# Patient Record
Sex: Male | Born: 1950
Health system: Southern US, Community
[De-identification: ages and names within clinical notes are randomized; demographics above are authoritative.]

## PROBLEM LIST (undated history)

## (undated) DIAGNOSIS — I4821 Permanent atrial fibrillation: Secondary | ICD-10-CM

## (undated) DIAGNOSIS — N529 Male erectile dysfunction, unspecified: Secondary | ICD-10-CM

## (undated) DIAGNOSIS — E119 Type 2 diabetes mellitus without complications: Secondary | ICD-10-CM

## (undated) DIAGNOSIS — E785 Hyperlipidemia, unspecified: Secondary | ICD-10-CM

## (undated) DIAGNOSIS — E291 Testicular hypofunction: Secondary | ICD-10-CM

## (undated) DIAGNOSIS — M545 Low back pain, unspecified: Secondary | ICD-10-CM

## (undated) DIAGNOSIS — I82509 Chronic embolism and thrombosis of unspecified deep veins of unspecified lower extremity: Secondary | ICD-10-CM

## (undated) DIAGNOSIS — G8929 Other chronic pain: Secondary | ICD-10-CM

## (undated) DIAGNOSIS — I5022 Chronic systolic (congestive) heart failure: Secondary | ICD-10-CM

## (undated) DIAGNOSIS — S83209A Unspecified tear of unspecified meniscus, current injury, unspecified knee, initial encounter: Secondary | ICD-10-CM

## (undated) DIAGNOSIS — I10 Essential (primary) hypertension: Secondary | ICD-10-CM

## (undated) HISTORY — DX: Low back pain, unspecified: M54.50

## (undated) HISTORY — DX: Hyperlipidemia, unspecified: E78.5

## (undated) HISTORY — DX: Other chronic pain: G89.29

## (undated) HISTORY — DX: Unspecified tear of unspecified meniscus, current injury, unspecified knee, initial encounter: S83.209A

## (undated) HISTORY — DX: Male erectile dysfunction, unspecified: N52.9

## (undated) HISTORY — DX: Testicular hypofunction: E29.1

## (undated) HISTORY — DX: Low back pain: M54.5

## (undated) HISTORY — PX: OTHER SURGICAL HISTORY: SHX169

---

## 2009-01-31 ENCOUNTER — Ambulatory Visit: Payer: Self-pay | Admitting: General Practice

## 2009-09-22 ENCOUNTER — Ambulatory Visit: Payer: Self-pay | Admitting: General Practice

## 2010-02-17 ENCOUNTER — Encounter: Payer: Self-pay | Admitting: Cardiovascular Disease

## 2010-02-17 ENCOUNTER — Ambulatory Visit: Payer: Self-pay | Admitting: Internal Medicine

## 2010-02-17 ENCOUNTER — Inpatient Hospital Stay: Payer: Self-pay | Admitting: Internal Medicine

## 2010-02-17 LAB — CONVERTED CEMR LAB
Cholesterol: 139 mg/dL
HDL: 38 mg/dL
LDL Cholesterol: 79 mg/dL
Triglyceride fasting, serum: 111 mg/dL

## 2010-02-18 ENCOUNTER — Encounter: Payer: Self-pay | Admitting: Internal Medicine

## 2010-02-20 ENCOUNTER — Ambulatory Visit: Payer: Self-pay | Admitting: Cardiology

## 2010-02-20 ENCOUNTER — Encounter: Payer: Self-pay | Admitting: Internal Medicine

## 2010-02-20 ENCOUNTER — Encounter: Payer: Self-pay | Admitting: Cardiovascular Disease

## 2010-02-20 DIAGNOSIS — I482 Chronic atrial fibrillation, unspecified: Secondary | ICD-10-CM | POA: Insufficient documentation

## 2010-02-20 DIAGNOSIS — I4891 Unspecified atrial fibrillation: Secondary | ICD-10-CM

## 2010-02-23 ENCOUNTER — Encounter: Payer: Self-pay | Admitting: Internal Medicine

## 2010-02-23 ENCOUNTER — Ambulatory Visit: Payer: Self-pay | Admitting: Internal Medicine

## 2010-02-23 DIAGNOSIS — I10 Essential (primary) hypertension: Secondary | ICD-10-CM | POA: Insufficient documentation

## 2010-02-23 DIAGNOSIS — I129 Hypertensive chronic kidney disease with stage 1 through stage 4 chronic kidney disease, or unspecified chronic kidney disease: Secondary | ICD-10-CM | POA: Insufficient documentation

## 2010-02-23 DIAGNOSIS — I420 Dilated cardiomyopathy: Secondary | ICD-10-CM | POA: Insufficient documentation

## 2010-02-23 LAB — CONVERTED CEMR LAB
BUN: 23 mg/dL (ref 6–23)
Creatinine, Ser: 0.87 mg/dL (ref 0.40–1.50)
Glucose, Bld: 182 mg/dL — ABNORMAL HIGH (ref 70–99)
Prothrombin Time: 14.8 s
Prothrombin Time: 14.8 s (ref 11.6–15.2)

## 2010-03-02 ENCOUNTER — Ambulatory Visit: Payer: Self-pay | Admitting: Internal Medicine

## 2010-03-05 LAB — CONVERTED CEMR LAB
ALT: 17 units/L (ref 0–53)
CO2: 22 meq/L (ref 19–32)
Calcium: 9.2 mg/dL (ref 8.4–10.5)
Chloride: 98 meq/L (ref 96–112)
Creatinine, Ser: 1.03 mg/dL (ref 0.40–1.50)
Glucose, Bld: 271 mg/dL — ABNORMAL HIGH (ref 70–99)
Total Bilirubin: 0.7 mg/dL (ref 0.3–1.2)
Total Protein: 6.7 g/dL (ref 6.0–8.3)

## 2010-03-09 ENCOUNTER — Telehealth: Payer: Self-pay | Admitting: Internal Medicine

## 2010-03-30 ENCOUNTER — Ambulatory Visit: Payer: Self-pay | Admitting: General Practice

## 2010-04-15 ENCOUNTER — Ambulatory Visit: Payer: Self-pay | Admitting: Cardiovascular Disease

## 2010-04-15 ENCOUNTER — Telehealth: Payer: Self-pay | Admitting: Cardiovascular Disease

## 2010-04-15 ENCOUNTER — Emergency Department: Payer: Self-pay | Admitting: Emergency Medicine

## 2010-05-04 ENCOUNTER — Encounter: Payer: Self-pay | Admitting: Cardiovascular Disease

## 2010-05-04 ENCOUNTER — Ambulatory Visit: Payer: Self-pay | Admitting: Cardiovascular Disease

## 2010-05-05 LAB — CONVERTED CEMR LAB
CO2: 23 meq/L (ref 19–32)
Chloride: 101 meq/L (ref 96–112)
Eosinophils Absolute: 0.1 10*3/uL (ref 0.0–0.7)
HCT: 49.3 % (ref 39.0–52.0)
Lymphocytes Relative: 20 % (ref 12–46)
Lymphs Abs: 1.7 10*3/uL (ref 0.7–4.0)
MCV: 89.2 fL (ref 78.0–100.0)
Monocytes Relative: 6 % (ref 3–12)
Neutrophils Relative %: 72 % (ref 43–77)
Platelets: 220 10*3/uL (ref 150–400)
Potassium: 4.6 meq/L (ref 3.5–5.3)
RBC: 5.53 M/uL (ref 4.22–5.81)
WBC: 8.3 10*3/uL (ref 4.0–10.5)

## 2010-05-15 ENCOUNTER — Ambulatory Visit: Payer: Self-pay | Admitting: Cardiovascular Disease

## 2010-05-18 ENCOUNTER — Ambulatory Visit: Payer: Self-pay | Admitting: Cardiovascular Disease

## 2010-05-18 ENCOUNTER — Encounter: Payer: Self-pay | Admitting: Cardiovascular Disease

## 2010-05-22 ENCOUNTER — Ambulatory Visit: Payer: Self-pay | Admitting: Cardiovascular Disease

## 2010-06-01 ENCOUNTER — Encounter: Payer: Self-pay | Admitting: Cardiovascular Disease

## 2010-06-01 ENCOUNTER — Telehealth: Payer: Self-pay | Admitting: Cardiovascular Disease

## 2010-06-03 ENCOUNTER — Ambulatory Visit: Payer: Self-pay | Admitting: Cardiovascular Disease

## 2010-06-03 DIAGNOSIS — R609 Edema, unspecified: Secondary | ICD-10-CM | POA: Insufficient documentation

## 2010-06-05 LAB — CONVERTED CEMR LAB
BUN: 20 mg/dL (ref 6–23)
CO2: 25 meq/L (ref 19–32)
Chloride: 103 meq/L (ref 96–112)
Creatinine, Ser: 1.02 mg/dL (ref 0.40–1.50)
Glucose, Bld: 151 mg/dL — ABNORMAL HIGH (ref 70–99)
Potassium: 4.4 meq/L (ref 3.5–5.3)

## 2010-12-08 NOTE — Assessment & Plan Note (Signed)
Summary: EKG= NURSE VISIT  Nurse Visit   Vital Signs:  Patient profile:   60 year old male Weight:      255.75 pounds Pulse rate:   72 / minute BP sitting:   149 / 96  Vitals Entered By: Sherri Rad, RN, BSN (May 22, 2010 8:45 AM)  Visit Type:  Nurse Visit- EKG    Allergies: No Known Drug Allergies  Appended Document: EKG= NURSE VISIT Still in atrial fib, rate has significant improved.  If he would like to repeat cardioversion, would continue amio 200 two times a day.  If he does not want to do cardioversion at this time, would decrease amio to 200 mg daily  Appended Document: EKG= NURSE VISIT I attempted to call the pt. Dr. Mariah Milling had also given me orders to have the pt increase his amlodipine to 10mg  once daily. I have instructed the pt on his voice mail to do this, but have asked that he call back monday with how he feels about having a repeat DCCV done- we will adjust his amiodarone based on his decision. The pt did tell me that his appt's will need to be on M/W/F in the mornings. He will be on vacation next week.  Appended Document: EKG= NURSE VISIT Spoke with Dr. Mariah Milling about patient with swelling in legs. Told patient to cut Amlodipine 10 mg in half and to take Furosemide 20 mg one tablet in the a.m. with one tablet at 3:00 pm. x 3 days and then decrease to one tablet daily. He was instructed to take potassium 20 meq one tablet two times a day along with his Furosemide tablet.  Rx for Potassium Chloride 20 meq one tablet twice a day called to [facility]. He was also instructed to watch his fluid/salt intake. He will call to let us know how he is doing while at the beach.

## 2010-12-08 NOTE — Miscellaneous (Signed)
Summary: simvastatin,pradaxa,furosemide,metoprolol,amiodarone  Clinical Lists Changes  Medications: Rx of SIMVASTATIN 40 MG TABS (SIMVASTATIN) Take 1 tablet by mouth once daily;  #30 x 6;  Signed;  Entered by: Benedict Needy, RN;  Authorized by: Dossie Arbour MD;  Method used: Print then Give to Patient Rx of PRADAXA 150 MG CAPS (DABIGATRAN ETEXILATE MESYLATE) Take 1 tablet by mouth two times a day;  #60 x 6;  Signed;  Entered by: Benedict Needy, RN;  Authorized by: Dossie Arbour MD;  Method used: Print then Give to Patient Rx of FUROSEMIDE 20 MG TABS (FUROSEMIDE) Take 1 tablet by mouth two times a day as needed;  #60 x 6;  Signed;  Entered by: Benedict Needy, RN;  Authorized by: Dossie Arbour MD;  Method used: Print then Give to Patient Rx of METOPROLOL TARTRATE 50 MG TABS (METOPROLOL TARTRATE) 1 tablet two times everyday;  #60 x 6;  Signed;  Entered by: Benedict Needy, RN;  Authorized by: Dossie Arbour MD;  Method used: Print then Give to Patient Rx of AMIODARONE HCL 200 MG TABS (AMIODARONE HCL) Take 2 tabs twice a day for 4 days then decrease to 1 tab twice a day.;  #60 x 6;  Signed;  Entered by: Benedict Needy, RN;  Authorized by: Dossie Arbour MD;  Method used: Print then Give to Patient    Prescriptions: AMIODARONE HCL 200 MG TABS (AMIODARONE HCL) Take 2 tabs twice a day for 4 days then decrease to 1 tab twice a day.  #60 x 6   Entered by:   Benedict Needy, RN   Authorized by:   Dossie Arbour MD   Signed by:   Benedict Needy, RN on 05/15/2010   Method used:   Print then Give to Patient   RxID:   [id number] METOPROLOL TARTRATE 50 MG TABS (METOPROLOL TARTRATE) 1 tablet two times everyday  #60 x 6   Entered by:   Benedict Needy, RN   Authorized by:   Dossie Arbour MD   Signed by:   Benedict Needy, RN on 05/15/2010   Method used:   Print then Give to Patient   RxID:   [id number] FUROSEMIDE 20 MG TABS (FUROSEMIDE) Take 1 tablet by mouth two times a day as needed  #60 x 6   Entered by:    Benedict Needy, RN   Authorized by:   Dossie Arbour MD   Signed by:   Benedict Needy, RN on 05/15/2010   Method used:   Print then Give to Patient   RxID:   [id number] PRADAXA 150 MG CAPS (DABIGATRAN ETEXILATE MESYLATE) Take 1 tablet by mouth two times a day  #60 x 6   Entered by:   Benedict Needy, RN   Authorized by:   Dossie Arbour MD   Signed by:   Benedict Needy, RN on 05/15/2010   Method used:   Print then Give to Patient   RxID:   [id number] SIMVASTATIN 40 MG TABS (SIMVASTATIN) Take 1 tablet by mouth once daily  #30 x 6   Entered by:   Benedict Needy, RN   Authorized by:   Dossie Arbour MD   Signed by:   Benedict Needy, RN on 05/15/2010   Method used:   Print then Give to Patient   RxID:   [id number]

## 2010-12-08 NOTE — Progress Notes (Signed)
Summary: LE Swelling  Phone Note Call from Patient   Caller: Patient Call For: [facility] Summary of Call: Pt concerned about LE swelling. Was instructed to cut amolodipine in half has not been doing so. Will start cutting medication in half and see Dr. Mariah Milling on Wednesday.  Initial call taken by: Benedict Needy, RN,  June 01, 2010 8:59 AM

## 2010-12-08 NOTE — Progress Notes (Signed)
Summary: schedule f/u with Dr Mariah Milling  Phone Note Outgoing Call   Call placed by: Cloyde Reams RN,  April 15, 2010 3:23 PM Summary of Call: Pt seen at hospital, per Dr Mariah Milling pt needs a f/u appt in 1-2 weeks with Dr Mariah Milling.  Call pt tomorrow to schedule appt. Pt needs 10 day CBC for Pradaxa at [facility] with Dr Mariah Milling. Discharged on Metoprolol 50mg  two times a day, Pradaxa 150mg  two times a day,Metformin 1000mg  two times a day,Lasix 20mg  two times a day as needed. Initial call taken by: Cloyde Reams RN,  April 15, 2010 3:26 PM  Follow-up for Phone Call        Pt scheduled for f/u visit with Dr Mariah Milling on 05/04/10. Follow-up by: Cloyde Reams RN,  April 17, 2010 1:51 PM    New/Updated Medications: LOPRESSOR 50 MG TABS (METOPROLOL TARTRATE) Take 1  tablet by mouth twice daily PRADAXA 150 MG CAPS (DABIGATRAN ETEXILATE MESYLATE) Take 1 tablet by mouth two times a day FUROSEMIDE 20 MG TABS (FUROSEMIDE) Take 1 tablet by mouth two times a day as needed

## 2010-12-08 NOTE — Op Note (Signed)
Summary: Operative Report  Operative Report   Imported By: [place] 06/01/2010 09:31:50  _____________________________________________________________________  External Attachment:    Type:   Image     Comment:   External Document

## 2010-12-08 NOTE — Assessment & Plan Note (Signed)
Summary: EPH/AMD   Visit Type:  Follow-up Primary Provider:  Loraine Leriche Crissman,M.D.  CC:  No cardiac complaints.  History of Present Illness: Jeff [name] is a 60 y/o male with h/o HTN, DM2 and hyperlipidemia with recent admission to [facility] in April for atrial fibrillation with RVR, repeat visit to the emergency room on June 8 where I consult on him for rapid atrial fibrillation.  In the emergency room, he was started on pradaxa, and metoprolol for rate control. He was also given furosemide. He states that he has been feeling relatively well though would like to do the procedure to get out of atrial fibrillation. no significant chest pain or shortness of breath or lower extremity edema. No cough.   Echo in 02/2010 shows EF 40-45% which was felt to be tachy-induced.  EKG shows atrial fibrillation with ventricular rate 104 weeks per minute, no significant ST or T wave changes.   Current Medications (verified): 1)  Simvastatin 40 Mg Tabs (Simvastatin) .... Take 1 Tablet By Mouth Once Daily 2)  Metformin Hcl 1000 Mg Tabs (Metformin Hcl) .... Take 1 Tablet By Mouth Twice Daily 3)  Pradaxa 150 Mg Caps (Dabigatran Etexilate Mesylate) .... Take 1 Tablet By Mouth Two Times A Day 4)  Furosemide 20 Mg Tabs (Furosemide) .... Take 1 Tablet By Mouth Two Times A Day As Needed 5)  Metoprolol Tartrate 50 Mg Tabs (Metoprolol Tartrate) .Marland Kitchen.. 1 Tablet Two Times Everyday 6)  Ciprofloxacin Hcl 500 Mg Tabs (Ciprofloxacin Hcl) .Marland Kitchen.. 1 Two Times A Day  Allergies (verified): No Known Drug Allergies  Past History:  Past Medical History: Last updated: 02/23/2010 1. Atrial Fibrillation (dx'd 02/2010) 2. 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  3. Diabetes Type 1 4. Hypertension 5. Hyperlipidemia 6. Chronic low back pain  Family History: Last updated: 05/04/2010 Family History of Coronary Artery Disease:  Family  History of Diabetes:   Social History: Last updated: 02/23/2010 Divorced. Former [employer] information [employer] manager. Previous tobacco.   Family History: Family History of Coronary Artery Disease:  Family History of Diabetes:   Review of Systems  The patient denies fever, weight loss, weight gain, vision loss, decreased hearing, hoarseness, chest pain, syncope, dyspnea on exertion, peripheral edema, prolonged cough, abdominal pain, incontinence, muscle weakness, depression, and enlarged lymph nodes.    Vital Signs:  Patient profile:   60 year old male Height:      71 inches Weight:      246 pounds BMI:     34.43 Pulse rate:   108 / minute BP sitting:   150 / 99  (left arm) Cuff size:   large  Vitals Entered By: Bishop Dublin, CMA (May 04, 2010 9:37 AM)  Physical Exam  General:  Well developed, well nourished, in no acute distress. Head:  normocephalic and atraumatic Neck:  Neck supple, no JVD. No masses, thyromegaly or abnormal cervical nodes. Lungs:  Clear bilaterally to auscultation and percussion. Heart:  Non-displaced PMI, chest non-tender; irregular rate and rhythm, S1, S2 without murmurs, rubs or gallops. Carotid upstroke normal, no bruit.  Pedals normal pulses. No edema, no varicosities. Abdomen:  Bowel sounds positive; abdomen soft and non-tender without masses Msk:  Back normal, normal gait. Muscle strength and tone normal. Pulses:  pulses normal in all 4 extremities Extremities:  No clubbing or cyanosis. Neurologic:  Alert and oriented x 3. Skin:  Intact without lesions or rashes. Psych:  Normal affect.   New Orders:  1)  T-CBC w/Diff ([id number])     2)  T-Basic Metabolic Panel ([id number])   Impression & Recommendations:  Problem # 1:  ATRIAL FIBRILLATION (ICD-427.31) he has been in chronic atrial fibrillation for close to 2-1/2 months. He has been on pradaxa b.i.d. We will set him up for a cardioversion in one week at [facility].  In an effort to slow him down and prepare  him for cardioversion, we will start him on amiodarone 400 mg b.i.d. for 4 days decreasing to 200 mg b.i.d. until cardioversion.  The following medications were removed from the medication list:    Lopressor 50 Mg Tabs (Metoprolol tartrate) .Marland Kitchen... Take 1  tablet by mouth twice daily    Digoxin 0.25 Mg Tabs (Digoxin) .Marland Kitchen... Take 1 tablet by mouth once daily His updated medication list for this problem includes:    Metoprolol Tartrate 50 Mg Tabs (Metoprolol tartrate) .Marland Kitchen... 1 tablet two times everyday    Amiodarone Hcl 200 Mg Tabs (Amiodarone hcl) .Marland Kitchen... Take 2 tabs twice a day for 4 days then decrease to 1 tab twice a day.  Orders: T-CBC w/Diff [id number]) T-Basic Metabolic Panel [id number])  Problem # 2:  HYPERTENSION, BENIGN (ICD-401.1) Blood pressure is borderline elevated and we will continue to monitor this We will adjust his medications if needed with increasing his amlodipine to a 10 mg dose if his pressure continues to be greater than 140.  The following medications were removed from the medication list:    Amlodipine Besylate 5 Mg Tabs (Amlodipine besylate) .Marland Kitchen... Take 1 tablet by mouth once daily    Benazepril Hcl 40 Mg Tabs (Benazepril hcl) .Marland Kitchen... Take 1 tablet by mouth once daily as needed    Lopressor 50 Mg Tabs (Metoprolol tartrate) .Marland Kitchen... Take 1  tablet by mouth twice daily    Spironolactone 25 Mg Tabs (Spironolactone) .Marland Kitchen... Take 1/2 tablet by mouth daily His updated medication list for this problem includes:    Furosemide 20 Mg Tabs (Furosemide) .Marland Kitchen... Take 1 tablet by mouth two times a day as needed    Metoprolol Tartrate 50 Mg Tabs (Metoprolol tartrate) .Marland Kitchen... 1 tablet two times everyday  Orders: T-CBC w/Diff ([id number]) T-Basic Metabolic Panel ([id number])  Problem # 3:  CARDIOMYOPATHY, PRIMARY, DILATED (ICD-425.4) Mildly depressed systolic function in the setting of atrial fibrillation. No signs of heart failure on today's visit.  The following medications were  removed from the medication list:    Amlodipine Besylate 5 Mg Tabs (Amlodipine besylate) .Marland Kitchen... Take 1 tablet by mouth once daily    Benazepril Hcl 40 Mg Tabs (Benazepril hcl) .Marland Kitchen... Take 1 tablet by mouth once daily as needed    Lopressor 50 Mg Tabs (Metoprolol tartrate) .Marland Kitchen... Take 1  tablet by mouth twice daily    Digoxin 0.25 Mg Tabs (Digoxin) .Marland Kitchen... Take 1 tablet by mouth once daily    Spironolactone 25 Mg Tabs (Spironolactone) .Marland Kitchen... Take 1/2 tablet by mouth daily His updated medication list for this problem includes:    Furosemide 20 Mg Tabs (Furosemide) .Marland Kitchen... Take 1 tablet by mouth two times a day as needed    Metoprolol Tartrate 50 Mg Tabs (Metoprolol tartrate) .Marland Kitchen... 1 tablet two times everyday    Amiodarone Hcl 200 Mg Tabs (Amiodarone hcl) .Marland Kitchen... Take 2 tabs twice a day for 4 days then decrease to 1 tab twice a day.  Orders: T-CBC w/Diff [id number]) T-Basic Metabolic Panel [id number])  Patient Instructions: 1)  Your physician recommends that you have for lab work today  2)  Your physician has recommended you make the following change in your medication: START amiodarone 400mg  two times a day for 4 days and then 3)  Your physician has recommended that you have a cardioversion (DCCV).  Electrical cardioversion uses a jolt of electricity to your heart either through paddles or wired patches attached to your chest. This is a controlled, usually prescheduled, procedure. Defibrillation is done under light anesthesia in the hospital, and you usually go home the day of the procedure. This is done to get your heart back into a normal rhythm. You are not awake for the procedure. Please see the instruction sheet given to you today. Prescriptions: AMIODARONE HCL 200 MG TABS (AMIODARONE HCL) Take 2 tabs twice a day for 4 days then decrease to 1 tab twice a day.  #76 x 0   Entered by:   Benedict Needy, RN   Authorized by:   Dossie Arbour MD   Signed by:   Benedict Needy, RN on 05/04/2010   Method  used:   Print then Give to Patient   RxID:   [id number] PRADAXA 150 MG CAPS (DABIGATRAN ETEXILATE MESYLATE) Take 1 tablet by mouth two times a day  #60 x 3   Entered by:   Bishop Dublin, CMA   Authorized by:   Dossie Arbour MD   Signed by:   Bishop Dublin, CMA on 05/04/2010   Method used:   Electronically to        [facility] [phone].* (retail)       [address]       [place], [place]  [address]       Ph: [phone]       Fax: [phone]   RxID:   [id number] FUROSEMIDE 20 MG TABS (FUROSEMIDE) Take 1 tablet by mouth two times a day as needed  #60 x 3   Entered by:   Bishop Dublin, CMA   Authorized by:   Dossie Arbour MD   Signed by:   Bishop Dublin, CMA on 05/04/2010   Method used:   Electronically to        [facility] [phone].* (retail)       [address]       [place], [place]  [address]       Ph: [phone]       Fax: [phone]   RxID:   [id number]

## 2010-12-08 NOTE — Progress Notes (Signed)
Summary: LABWORK  Phone Note Call from Patient Call back at Home Phone [phone]   Caller: SELF Call For: GOLLAN Summary of Call: PT WANTS TO KNOW IF IT IS NECESSARY THAT HE COME FOR BLOODWORD TODAY-STATES THAT HE WAS NEVER CALLED BACK AB0UT HIS PT INR FROM LAST WEEK TO LET HIM KNOW THE RESULTS Initial call taken by: Harlon Flor,  Mar 09, 2010 12:39 PM  Follow-up for Phone Call        Attempted TCB pt.  LMOM to call back.  Pt appears to be pending DCCV after 4 consecutive INR's greater than 2.0.   Follow-up by: Cloyde Reams RN,  Mar 09, 2010 1:53 PM  Additional Follow-up for Phone Call Additional follow up Details #1::        Called spoke with pt.  Pt states he has not decided for sure if he wants to proceed with DCCV.  Advised pt he still needs to come in this week for PT/INR since he is a new start on coumadin.  Pt is currently taking 5mg  daily except 7.5mg  on Tu,Th.  Advised to continue on same dosage INR 2.07 on 03/02/10 and to come in tomorrow for PT/INR.   Additional Follow-up by: Cloyde Reams RN,  Mar 09, 2010 3:29 PM

## 2010-12-08 NOTE — Assessment & Plan Note (Signed)
Summary: POST DCCV/ALT   Visit Type:  Follow-up Primary Provider:  Loraine Leriche Crissman,M.D.  CC:  c/o swelling still in legs..  History of Present Illness: Jeff [name] is a 60 y/o male with h/o HTN, DM2 and hyperlipidemia with recent admission to [facility] in April for atrial fibrillation with RVR, repeat visit to the emergency room on June 8 where I consult on him for rapid atrial fibrillation, s/p cardioversion on 05/15/2010, presenting with lower extremity edema.  He reports that over the past week or 2, his edema has gotten much worse. It is both legs, up to the knees and is pitting. He denies excessive fluid or salt intake though [place] the [place]. He denies any significant shortness of breath. he does not feel any palpitations.   Echo in 02/2010 shows EF 40-45% which was felt to be tachy-induced.  EKG today shows normal sinus rhythm with rate of 54 beats per minute, no significant ST or T wave changes   Current Medications (verified): 1)  Simvastatin 40 Mg Tabs (Simvastatin) .... Take 1 Tablet By Mouth Once Daily 2)  Metformin Hcl 1000 Mg Tabs (Metformin Hcl) .... Take 1 Tablet By Mouth Twice Daily 3)  Pradaxa 150 Mg Caps (Dabigatran Etexilate Mesylate) .... Take 1 Tablet By Mouth Two Times A Day 4)  Furosemide 20 Mg Tabs (Furosemide) .... Take 1 Tablet By Mouth Two Times A Day As Needed 5)  Metoprolol Tartrate 50 Mg Tabs (Metoprolol Tartrate) .Marland Kitchen.. 1 Tablet Two Times Everyday 6)  Ciprofloxacin Hcl 500 Mg Tabs (Ciprofloxacin Hcl) .Marland Kitchen.. 1 Two Times A Day 7)  Amiodarone Hcl 200 Mg Tabs (Amiodarone Hcl) .... Take One Tablet By Mouth Twice A Day 8)  Amlodipine Besylate 10 Mg Tabs (Amlodipine Besylate) .... Take 1 Tablet By Mouth Daily  Allergies (verified): No Known Drug Allergies  Past History:  Past Medical History: Last updated: 02/23/2010 1. Atrial Fibrillation (dx'd 02/2010) 2. 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  3. Diabetes Type 1 4. Hypertension 5. Hyperlipidemia 6. 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   Review of Systems       The patient complains of peripheral edema.  The patient denies fever, weight loss, weight gain, vision loss, decreased hearing, hoarseness, chest pain, syncope, dyspnea on exertion, prolonged cough, abdominal pain, incontinence, muscle weakness, depression, and enlarged lymph nodes.    Vital Signs:  Patient profile:   60 year old male Height:      71 inches Weight:      256 pounds BMI:     35.83 Pulse rate:   55 / minute BP sitting:   148 / 85  (left arm) Cuff size:   large  Vitals Entered By: Bishop Dublin, CMA (June 03, 2010 9:38 AM)  Physical Exam  General:  Well developed, well nourished, in no acute distress. Head:  normocephalic and atraumatic Neck:  Neck supple, no JVD. No masses, thyromegaly or abnormal cervical nodes. Lungs:  Clear bilaterally to auscultation and percussion. Heart:  Non-displaced PMI, chest non-tender; regular rate and rhythm, S1, S2 without murmurs, rubs or gallops. Carotid upstroke normal, no bruit. Pedals normal pulses. 1 to 2+ pitting edema LE b/l, no varicosities. Abdomen:  Bowel sounds positive; abdomen soft and non-tender without masses  Msk:  Back normal, normal gait. Muscle strength and tone normal. Extremities:  No clubbing or cyanosis. Neurologic:  Alert and oriented x 3. Skin:  Intact without lesions or rashes. Psych:  Normal affect.   Impression & Recommendations:  Problem # 1:  ATRIAL FIBRILLATION (ICD-427.31)  he has converted from atrial fibrillation to normal sinus rhythm over the past 2 weeks. I suspect it may have been with better blood pressure control and  mild diuresis. We'll continue him on his current medications.  The following medications were removed from the medication list:    Amiodarone Hcl 400 Mg Tabs (Amiodarone hcl) .Marland Kitchen... Take one tablet by mouth twice a dayfor 1 week His updated medication list for this problem includes:    Metoprolol Tartrate 50 Mg Tabs (Metoprolol tartrate) .Marland Kitchen... 1 tablet two times everyday    Amiodarone Hcl 200 Mg Tabs (Amiodarone hcl) .Marland Kitchen... Take one tablet by mouth twice a day  Orders: EKG w/ Interpretation (93000)  The following medications were removed from the medication list:    Amiodarone Hcl 400 Mg Tabs (Amiodarone hcl) .Marland Kitchen... Take one tablet by mouth twice a dayfor 1 week His updated medication list for this problem includes:    Metoprolol Tartrate 50 Mg Tabs (Metoprolol tartrate) .Marland Kitchen... 1 tablet two times everyday    Amiodarone Hcl 200 Mg Tabs (Amiodarone hcl) .Marland Kitchen... Take one tablet by mouth twice a day  Problem # 2:  HYPERTENSION, BENIGN (ICD-401.1)  He does have a history of hypertension. I feel that his lower extremity edema is likely due to his amlodipine. We will hold amlodipine and start lisinopril 20 mg daily.  We have ordered a basic metabolic panel today to evaluate his kidney function and potassium as he is on Lasix b.i.d.  The following medications were removed from the medication list:    Amlodipine Besylate 10 Mg Tabs (Amlodipine besylate) .Marland Kitchen... Take 1 tablet by mouth daily His updated medication list for this problem includes:    Furosemide 20 Mg Tabs (Furosemide) .Marland Kitchen... Take 1 tablet by mouth two times a day as needed    Metoprolol Tartrate 50 Mg Tabs (Metoprolol tartrate) .Marland Kitchen... 1 tablet two times everyday    Lisinopril 20 Mg Tabs (Lisinopril) .Marland Kitchen... Take one tablet by mouth daily  The following medications were removed from the medication list:    Amlodipine Besylate 10 Mg Tabs (Amlodipine besylate) .Marland Kitchen... Take 1 tablet by mouth daily His updated medication list for this problem  includes:    Furosemide 20 Mg Tabs (Furosemide) .Marland Kitchen... Take 1 tablet by mouth two times a day as needed    Metoprolol Tartrate 50 Mg Tabs (Metoprolol tartrate) .Marland Kitchen... 1 tablet two times everyday    Lisinopril 20 Mg Tabs (Lisinopril) .Marland Kitchen... Take one tablet by mouth daily  Problem # 3:  CARDIOMYOPATHY, PRIMARY, DILATED (ICD-425.4) Mildly depressed ejection fraction was in the setting of atrial fibrillation with RVR. If his lower extremity edema does not improve, we will order a repeat echocardiogram to assess his LV function.  The following medications were removed from the medication list:    Amiodarone Hcl 400 Mg Tabs (Amiodarone hcl) .Marland Kitchen... Take one tablet by mouth twice a dayfor 1 week    Amlodipine Besylate 10 Mg Tabs (Amlodipine besylate) .Marland Kitchen... Take 1 tablet by mouth daily His updated medication list for this problem includes:    Furosemide 20 Mg Tabs (Furosemide) .Marland Kitchen... Take 1 tablet by mouth two times a day as needed    Metoprolol Tartrate 50 Mg Tabs (Metoprolol  tartrate) .Marland Kitchen... 1 tablet two times everyday    Amiodarone Hcl 200 Mg Tabs (Amiodarone hcl) .Marland Kitchen... Take one tablet by mouth twice a day    Lisinopril 20 Mg Tabs (Lisinopril) .Marland Kitchen... Take one tablet by mouth daily  Other Orders: T-Basic Metabolic Panel [id number])  Patient Instructions: 1)  Your physician recommends that you have lab work today BMP 2)  Your physician has recommended you make the following change in your medication: STOP amolodipine and START lisinopril 3)  Your physician wants you to follow-up in:  6 months  You will receive a reminder letter in the mail two months in advance. If you don't receive a letter, please call our office to schedule the follow-up appointment. Prescriptions: LISINOPRIL 20 MG TABS (LISINOPRIL) Take one tablet by mouth daily  #30 x 6   Entered by:   Benedict Needy, RN   Authorized by:   Dossie Arbour MD   Signed by:   Benedict Needy, RN on 06/03/2010   Method used:   Printed then faxed to  ...       [facility] [employer]. (mail-order)       [address]       [place], [place]  [address]       Ph: [phone]       Fax: [phone]   RxID:   [id number]

## 2010-12-08 NOTE — Letter (Signed)
[facility]   Imported By: Harlon Flor 02/20/2010 13:56:34  _____________________________________________________________________  External Attachment:    Type:   Image     Comment:   External Document

## 2010-12-08 NOTE — Medication Information (Signed)
Summary: [facility]  Anticoagulant Therapy  Managed by: Inactive PCP: Loraine Leriche Crissman,M.D. Supervising MD: Gala Romney MD, Reuel Boom Indication 1: Atrial Fibrillation Lab Used: [facility]  Site: [place] INR RANGE 2.0-3.0          Comments: Pt is off coumadin and is now on Pradaxa.  Allergies: No Known Drug Allergies  Anticoagulation Management History:      Negative risk factors for bleeding include an age less than 60 years old.  The bleeding index is 'low risk'.  Positive CHADS2 values include History of HTN.  Negative CHADS2 values include Age > 60 years old.  His last INR was 2.07.  Anticoagulation responsible provider: Bensimhon MD, Reuel Boom.    Anticoagulation Management Assessment/Plan:      The target INR is 2.5-3.5.  The next INR is due 03/02/2010.  Anticoagulation instructions were given to patient.  Results were reviewed/authorized by Inactive.         Prior Anticoagulation Instructions: coumadin 10 mg today then coumadin 5 mg daily with 7.5 mg on Tues and Thurs

## 2010-12-08 NOTE — Assessment & Plan Note (Signed)
Summary: NP6/AMD   CC:  ROV; No complaints.  History of Present Illness: Jeff [name] is a 60 y/o male with h/o HTN, DM2 and hyperlipidemia. Admitted to [facility] last week with acute CHF in setting of AF with RVR. Echo showed EF 40-45% which was felt to be tachy-induced.  Cardiac markers normal. Diuresed. Started on b-blocker, digoxin, spironloactone and coumadin. Returns for routine f/u. Feels great. No CP, SOB, palpitations or edema. INR 1.4 today.   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 BP labile 117/70 to 148/87.   Current Medications (verified): 1)  Simvastatin 40 Mg Tabs (Simvastatin) .... Take 1 Tablet By Mouth Once Daily 2)  Amlodipine Besylate 5 Mg Tabs (Amlodipine Besylate) .... Take 1 Tablet By Mouth Once Daily 3)  Metformin Hcl 1000 Mg Tabs (Metformin Hcl) .... Take 1 Tablet By Mouth Twice Daily 4)  Benazepril Hcl 40 Mg Tabs (Benazepril Hcl) .... Take 1 Tablet By Mouth Once Daily As Needed 5)  Lopressor 50 Mg Tabs (Metoprolol Tartrate) .... Take 1 1/2  Tablet By Mouth Twice Daily 6)  Digoxin 0.25 Mg Tabs (Digoxin) .... Take 1 Tablet By Mouth Once Daily 7)  Spironolactone 25 Mg Tabs (Spironolactone) .... Take 1/2 Tablet By Mouth Daily 8)  Coumadin 5 Mg Tabs (Warfarin Sodium) .... Take 1 Tablet By Mouth Once Daily  Allergies (verified): No Known Drug Allergies  Past History:  Past Medical History: 1. Atrial Fibrillation (dx'd 02/2010) 2. 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  3. Diabetes Type 1 4. Hypertension 5. Hyperlipidemia 6. 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   Review of Systems       As per HPI and past medical history; otherwise all systems negative.   Vital Signs:  Patient profile:   60 year old male Height:      71 inches Weight:      244 pounds BMI:     34.15 Pulse rate:   86 /  minute BP sitting:   146 / 96  (left arm) Cuff size:   large  Vitals Entered By: Stanton Kidney, EMT-P (February 23, 2010 12:11 PM)  Physical Exam  General:  Gen: well appearing. no resp difficulty HEENT: normal Neck: supple. no JVD. Carotids 2+ bilat; no bruits. No lymphadenopathy or thryomegaly appreciated. Cor: PMI nondisplaced. Irregular rate & rhythm. No rubs, gallops, murmur. Lungs: clear Abdomen: soft, nontender, nondistended. No hepatosplenomegaly. No bruits or masses. Good bowel sounds. Extremities: no cyanosis, clubbing, rash, edema Neuro: alert & orientedx3, cranial nerves grossly intact. moves all 4 extremities w/o difficulty. affect pleasant    Impression & Recommendations:  Problem # 1:  ATRIAL FIBRILLATION (ICD-427.31) Well rate controlled. Asymptomatic. Will continue current meds. Plan dc-cv after 4 weeks of therapeutic INR. Given CHADS = 2would suggest long-term coumadin.  Problem # 2:  CARDIOMYOPATHY, PRIMARY, DILATED (ICD-425.4) Likely tachycardic mediated. AF now controlled. Given RFs will need stress test in near future.   Problem # 3:  HYPERTENSION, BENIGN (ICD-401.1) Still mildly elevated. He will keep BP log and bring it to [place] for titration as needed.

## 2010-12-08 NOTE — Assessment & Plan Note (Signed)
Summary: ROV   Visit Type:  Follow-up Primary Provider:  Loraine Leriche Crissman,M.D.  CC:  a fib.  History of Present Illness: Jeff [name] is a 60 y/o male with h/o HTN, DM2 and hyperlipidemia with recent admission to [facility] in April for atrial fibrillation with RVR, repeat visit to the emergency room on June 8 where I consulted on him for rapid atrial fibrillation, with a cardioversion last week who presents this morning after converting back to atrial fibrillation.  He reports that he was having sexual activity over the weekend and felt very short of breath and dyspnea at the time that he had converted back to atrial fibrillation. He had to sit in a chair and wafer his heart rate did come down and then he felt better and his breathing improved. This morning he is comfortable with no more chest discomfort.   Echo in 02/2010 shows EF 40-45% which was felt to be tachy-induced.  EKG shows atrial fibrillation with ventricular rate of 90 beats per minute, poor R-wave progression through the precordial leads  vitals; afebrile, pulse in the low 90s and irregular, blood pressure 171/119 with initial pulse 98, repeat blood pressure 145/85.   Current Medications (verified): 1)  Simvastatin 40 Mg Tabs (Simvastatin) .... Take 1 Tablet By Mouth Once Daily 2)  Metformin Hcl 1000 Mg Tabs (Metformin Hcl) .... Take 1 Tablet By Mouth Twice Daily 3)  Pradaxa 150 Mg Caps (Dabigatran Etexilate Mesylate) .... Take 1 Tablet By Mouth Two Times A Day 4)  Furosemide 20 Mg Tabs (Furosemide) .... Take 1 Tablet By Mouth Two Times A Day As Needed 5)  Metoprolol Tartrate 50 Mg Tabs (Metoprolol Tartrate) .Marland Kitchen.. 1 Tablet Two Times Everyday 6)  Ciprofloxacin Hcl 500 Mg Tabs (Ciprofloxacin Hcl) .Marland Kitchen.. 1 Two Times A Day 7)  Amiodarone Hcl 400 Mg Tabs (Amiodarone Hcl) .... Take One Tablet By Mouth Twice A Dayfor 1 Week 8)  Amiodarone Hcl 200 Mg Tabs (Amiodarone Hcl) .... Take One Tablet By Mouth Twice A Day 9)   Amlodipine Besylate 10 Mg Tabs (Amlodipine Besylate) .... Take 1-1/2 Tablet By Mouth Daily  Allergies: No Known Drug Allergies  Past History:  Review of Systems       The patient complains of dyspnea on exertion and peripheral edema.  The patient denies fever, weight loss, weight gain, vision loss, decreased hearing, hoarseness, chest pain, syncope, prolonged cough, abdominal pain, incontinence, muscle weakness, depression, and enlarged lymph nodes.    Vital Signs:  Patient profile:   60 year old male Height:      71 inches Weight:      246 pounds BP sitting:   171 / 119  (right arm) Cuff size:   regular  Serial Vital Signs/Assessments:  Time      Position  BP       Pulse  Resp  Temp     By                     145/85                         Benedict Needy, RN   Physical Exam  General:  Well developed, well nourished, in no acute distress. Head:  normocephalic and atraumatic Neck:  Neck supple, no JVD. No masses, thyromegaly or abnormal cervical nodes. Lungs:  Clear bilaterally to auscultation and percussion. Heart:  Non-displaced PMI, chest non-tender; irregular rate and rhythm, S1, S2 without murmurs, rubs  or gallops. Carotid upstroke normal, no bruit. . Pedals normal pulses. Trace edema in the LE, no varicosities. Abdomen:  Bowel sounds positive; abdomen soft and non-tender without masses Msk:  Back normal, normal gait. Muscle strength and tone normal. Pulses:  pulses normal in all 4 extremities Extremities:  No clubbing or cyanosis. Neurologic:  Alert and oriented x 3. Skin:  Intact without lesions or rashes. Psych:  Normal affect.   Impression & Recommendations:  Problem # 1:  ATRIAL FIBRILLATION (ICD-427.31) after successful cardioversion last week, he has converted back to atrial fibrillation. he does have edema in his legs bilaterally that is trace. We will start him back on Lasix daily 20 mg. We'll increase his metoprolol back to 50 mg b.i.d. We will also  increase his amiodarone to 400 mg b.i.d. with an EKG in several days' time. If he does not convert back to normal sinus rhythm, we will decrease his amiodarone to 200 t.i.d. and consider a repeat cardioversion.  he denies any snoring consistent with sleep apnea. He does have pain in his back which may increase his heart rate and blood pressure.  His updated medication list for this problem includes:    Metoprolol Tartrate 50 Mg Tabs (Metoprolol tartrate) .Marland Kitchen... 1 tablet two times everyday    Amiodarone Hcl 400 Mg Tabs (Amiodarone hcl) .Marland Kitchen... Take one tablet by mouth twice a dayfor 1 week    Amiodarone Hcl 200 Mg Tabs (Amiodarone hcl) .Marland Kitchen... Take one tablet by mouth twice a day  Problem # 2:  HYPERTENSION, BENIGN (ICD-401.1) For his blood pressure, we will start him on amlodipine 5 mg daily. His updated medication list for this problem includes:    Furosemide 20 Mg Tabs (Furosemide) .Marland Kitchen... Take 1 tablet by mouth two times a day as needed    Metoprolol Tartrate 50 Mg Tabs (Metoprolol tartrate) .Marland Kitchen... 1 tablet two times everyday    Amlodipine Besylate 10 Mg Tabs (Amlodipine besylate) .Marland Kitchen... Take 1-1/2 tablet by mouth daily

## 2010-12-08 NOTE — Letter (Signed)
Summary: Cardioversion/TEE Catering manager at [facility] [address]   [place], [place] [address]   Phone: [phone]  Fax: [phone]    Cardioversion   You are scheduled for a Cardioversion  on May 12, 2010 at 7:30 with Dr. Mariah Milling.   Please arrive at the [facility] at 6:30 a.m. on the day of your procedure.  1)   DIET:    A)   Nothing to eat or drink after midnight except your medications with a sip of water.    2)   MAKE SURE YOU TAKE YOUR PRADAXA  4)   A)   DO NOT TAKE these medications before your procedure:      Metformin, furosemide  B)   YOU MAY TAKE ALL of your remaining medications with a small amount of water.    C)   START NEW medications:       Amiodarone 400mg  two times a day for 4 days and then 200mg  two times a day  after that 5)  Must have a responsible person to drive you home.  6)   Bring a current list of your medications and current insurance cards.   * Special Note:  Every effort is made to have your procedure done on time. Occasionally there are emergencies that present themselves at the hospital that may cause delays. Please be patient if a delay does occur.  * If you have any questions after you get home, please call the office at [phone].  Appended Document: Cardioversion/TEE Instructions Procedure rescheduled for July 8th at 7:30

## 2010-12-08 NOTE — Assessment & Plan Note (Signed)
Summary: EKG  Nurse Visit   Patient Instructions: 1)  Your physician recommends that you schedule a follow-up appointment in: EKG Friday  2)  Your physician has recommended you make the following change in your medication: Increase Amiodarone to 400mg  two times a day for 1 week then go back to taking 200mg  two times a day. Amolodipine 10mg  1/2 tab daily. Metoprolol 50mg  two times a day. Lasix as you where taking it before.    Allergies: No Known Drug Allergies Prescriptions: AMLODIPINE BESYLATE 10 MG TABS (AMLODIPINE BESYLATE) Take 1-1/2 tablet by mouth daily  #30 x 6   Entered by:   Benedict Needy, RN   Authorized by:   Dossie Arbour MD   Signed by:   Benedict Needy, RN on 05/18/2010   Method used:   Print then Give to Patient   RxID:   [id number] FUROSEMIDE 20 MG TABS (FUROSEMIDE) Take 1 tablet by mouth two times a day as needed  #60 x 6   Entered by:   Benedict Needy, RN   Authorized by:   Dossie Arbour MD   Signed by:   Benedict Needy, RN on 05/18/2010   Method used:   Print then Give to Patient   RxID:   [id number] AMIODARONE HCL 200 MG TABS (AMIODARONE HCL) Take one tablet by mouth twice a day  #60 x 5   Entered by:   Benedict Needy, RN   Authorized by:   Dossie Arbour MD   Signed by:   Benedict Needy, RN on 05/18/2010   Method used:   Print then Give to Patient   RxID:   [id number] AMIODARONE HCL 400 MG TABS (AMIODARONE HCL) Take one tablet by mouth twice a dayfor 1 week  #28 x 0   Entered by:   Benedict Needy, RN   Authorized by:   Dossie Arbour MD   Signed by:   Benedict Needy, RN on 05/18/2010   Method used:   Print then Give to Patient   RxID:   [id number]

## 2010-12-08 NOTE — Progress Notes (Signed)
Summary: PHI  PHI   Imported By: Harlon Flor 02/25/2010 12:32:22  _____________________________________________________________________  External Attachment:    Type:   Image     Comment:   External Document

## 2011-02-03 ENCOUNTER — Other Ambulatory Visit: Payer: Self-pay

## 2011-02-03 MED ORDER — AMIODARONE HCL 200 MG PO TABS: 200.0000 mg | ORAL_TABLET | Freq: Two times a day (BID) | ORAL | Status: DC

## 2011-05-13 ENCOUNTER — Telehealth: Payer: Self-pay | Admitting: *Deleted

## 2011-05-13 NOTE — Telephone Encounter (Signed)
lmom for ptcb verify K+, do not see on med list in chart.

## 2011-05-19 ENCOUNTER — Other Ambulatory Visit: Payer: Self-pay | Admitting: *Deleted

## 2011-05-19 MED ORDER — POTASSIUM CHLORIDE CRYS ER 20 MEQ PO TBCR: 20.0000 meq | EXTENDED_RELEASE_TABLET | Freq: Two times a day (BID) | ORAL | Status: DC

## 2011-05-30 ENCOUNTER — Emergency Department: Payer: Self-pay | Admitting: Emergency Medicine

## 2011-06-01 ENCOUNTER — Encounter: Payer: Self-pay | Admitting: Cardiovascular Disease

## 2011-07-15 ENCOUNTER — Encounter: Payer: Self-pay | Admitting: Cardiovascular Disease

## 2011-07-16 ENCOUNTER — Other Ambulatory Visit: Payer: Self-pay | Admitting: *Deleted

## 2011-07-16 MED ORDER — FUROSEMIDE 20 MG PO TABS: 20.0000 mg | ORAL_TABLET | Freq: Two times a day (BID) | ORAL | Status: DC | PRN

## 2011-07-19 ENCOUNTER — Ambulatory Visit: Payer: Self-pay | Admitting: Cardiovascular Disease

## 2011-10-22 ENCOUNTER — Telehealth: Payer: Self-pay

## 2011-10-22 MED ORDER — DABIGATRAN ETEXILATE MESYLATE 150 MG PO CAPS: 150.0000 mg | ORAL_CAPSULE | Freq: Two times a day (BID) | ORAL | Status: DC

## 2011-10-22 MED ORDER — METOPROLOL TARTRATE 50 MG PO TABS: 50.0000 mg | ORAL_TABLET | Freq: Two times a day (BID) | ORAL | Status: DC

## 2011-10-22 NOTE — Telephone Encounter (Signed)
Refill sent pradaxa & metoprolol to [facility].

## 2011-10-25 ENCOUNTER — Other Ambulatory Visit: Payer: Self-pay | Admitting: *Deleted

## 2011-10-25 MED ORDER — SIMVASTATIN 40 MG PO TABS: 40.0000 mg | ORAL_TABLET | Freq: Every day | ORAL | Status: DC

## 2011-11-12 ENCOUNTER — Ambulatory Visit: Payer: Self-pay | Admitting: Cardiovascular Disease

## 2011-11-19 ENCOUNTER — Telehealth: Payer: Self-pay

## 2011-11-19 MED ORDER — LISINOPRIL 20 MG PO TABS: 20.0000 mg | ORAL_TABLET | Freq: Every day | ORAL | Status: DC

## 2011-11-19 NOTE — Telephone Encounter (Signed)
Refill sent for lisinopril 20 mg take one tablet daily. 

## 2011-11-29 ENCOUNTER — Encounter: Payer: Self-pay | Admitting: Cardiovascular Disease

## 2011-11-29 ENCOUNTER — Ambulatory Visit: Payer: Self-pay | Admitting: Cardiovascular Disease

## 2011-11-29 ENCOUNTER — Ambulatory Visit (INDEPENDENT_AMBULATORY_CARE_PROVIDER_SITE_OTHER): Payer: No Typology Code available for payment source | Admitting: Cardiovascular Disease

## 2011-11-29 DIAGNOSIS — I10 Essential (primary) hypertension: Secondary | ICD-10-CM

## 2011-11-29 DIAGNOSIS — I428 Other cardiomyopathies: Secondary | ICD-10-CM

## 2011-11-29 DIAGNOSIS — I4891 Unspecified atrial fibrillation: Secondary | ICD-10-CM

## 2011-11-29 NOTE — Assessment & Plan Note (Signed)
Maintaining normal sinus rhythm. We did discuss with him whether he could potentially hold his anticoagulation. We'll continue this for now and he will closely monitor his pulse at work.

## 2011-11-29 NOTE — Patient Instructions (Signed)
You are doing well. No medication changes were made.  Please call us if you have new issues that need to be addressed before your next appt.  Your physician wants you to follow-up in: 12 months.  You will receive a reminder letter in the mail two months in advance. If you don't receive a letter, please call our office to schedule the follow-up appointment. 

## 2011-11-29 NOTE — Assessment & Plan Note (Signed)
Blood pressure is well controlled on today's visit. No changes made to the medications. 

## 2011-11-29 NOTE — Assessment & Plan Note (Signed)
Mildly decreased ejection fraction 2 years ago likely secondary. This has likely improved since then though he does not have any symptoms and we will not repeat any testing at this time.

## 2011-11-29 NOTE — Progress Notes (Signed)
Patient ID: Jeff [name], male    DOB: Mar 23, 1951, 61 y.o.   MRN: [id number]  HPI Comments: Jeff [name] is a 61 y/o male with h/o HTN, DM2 and hyperlipidemia with recent admission to [facility] in April for atrial fibrillation with RVR, repeat visit to the emergency room on April 15 2010 where I consult on him for rapid atrial fibrillation, s/p cardioversion,  lower extremity edema On calcium channel blockers, presents for routine followup.    He denies any significant shortness of breath. he does not feel any palpitations. Overall he has been doing well, works 2 jobs and has good energy with no chest pain.    Echo in 02/2010 shows EF 40-45% which was felt to be tachycardia induced.   EKG today shows normal sinus rhythm with rate of 59 beats per minute, no significant ST or T wave changes      Outpatient Encounter Prescriptions as of 11/29/2011  Medication Sig Dispense Refill  . amiodarone (PACERONE) 200 MG tablet Take 200 mg by mouth daily.      . dabigatran (PRADAXA) 150 MG CAPS Take 1 capsule (150 mg total) by mouth 2 (two) times daily.  60 capsule  3  . furosemide (LASIX) 20 MG tablet Take 1 tablet (20 mg total) by mouth 2 (two) times daily as needed.  60 tablet  3  . lisinopril (PRINIVIL,ZESTRIL) 20 MG tablet Take 1 tablet (20 mg total) by mouth daily.  30 tablet  6  . metFORMIN (GLUMETZA) 1000 MG (MOD) 24 hr tablet Take 1,000 mg by mouth 2 (two) times daily.        . metoprolol (LOPRESSOR) 50 MG tablet Take 1 tablet (50 mg total) by mouth 2 (two) times daily.  60 tablet  3  . potassium chloride SA (K-DUR,KLOR-CON) 20 MEQ tablet Take 1 tablet (20 mEq total) by mouth 2 (two) times daily.  60 tablet  2  . simvastatin (ZOCOR) 40 MG tablet Take 1 tablet (40 mg total) by mouth daily.  30 tablet  3    Review of Systems  Constitutional: Negative.   HENT: Negative.   Eyes: Negative.   Respiratory: Negative.   Cardiovascular: Negative.   Gastrointestinal: Negative.     Musculoskeletal: Negative.   Skin: Negative.   Neurological: Negative.   Hematological: Negative.   Psychiatric/Behavioral: Negative.   All other systems reviewed and are negative.    BP 134/80  Pulse 59  Ht 5\' 11"  (1.803 m)  Wt 259 lb 6.4 oz (117.663 kg)  BMI 36.18 kg/m2  Physical Exam  Nursing note and vitals reviewed. Constitutional: He is oriented to person, place, and time. He appears well-developed and well-nourished.  HENT:  Head: Normocephalic.  Nose: Nose normal.  Mouth/Throat: Oropharynx is clear and moist.  Eyes: Conjunctivae are normal. Pupils are equal, round, and reactive to light.  Neck: Normal range of motion. Neck supple. No JVD present.  Cardiovascular: Normal rate, regular rhythm, S1 normal, S2 normal, normal heart sounds and intact distal pulses.  Exam reveals no gallop and no friction rub.   No murmur heard. Pulmonary/Chest: Effort normal and breath sounds normal. No respiratory distress. He has no wheezes. He has no rales. He exhibits no tenderness.  Abdominal: Soft. Bowel sounds are normal. He exhibits no distension. There is no tenderness.  Musculoskeletal: Normal range of motion. He exhibits no edema and no tenderness.  Lymphadenopathy:    He has no cervical adenopathy.  Neurological: He is alert and oriented to  person, place, and time. Coordination normal.  Skin: Skin is warm and dry. No rash noted. No erythema.  Psychiatric: He has a normal mood and affect. His behavior is normal. Judgment and thought content normal.           Assessment and Plan

## 2011-12-01 ENCOUNTER — Ambulatory Visit: Payer: Self-pay | Admitting: General Practice

## 2011-12-28 ENCOUNTER — Other Ambulatory Visit: Payer: Self-pay | Admitting: *Deleted

## 2011-12-28 MED ORDER — AMIODARONE HCL 200 MG PO TABS: 200.0000 mg | ORAL_TABLET | Freq: Every day | ORAL | Status: DC

## 2012-02-22 ENCOUNTER — Emergency Department: Payer: Self-pay | Admitting: *Deleted

## 2012-02-23 LAB — TROPONIN I: Troponin-I: <0.02 ng/mL

## 2012-02-23 LAB — CBC
HCT: 45.4 % (ref 40.0–52.0)
HGB: 15.2 g/dL (ref 13.0–18.0)
MCH: 29.4 pg (ref 26.0–34.0)
MCV: 88 fL (ref 80–100)

## 2012-02-23 LAB — BASIC METABOLIC PANEL
BUN: 21 mg/dL — ABNORMAL HIGH (ref 7–18)
Calcium, Total: 8.7 mg/dL (ref 8.5–10.1)
Chloride: 101 mmol/L (ref 98–107)
Co2: 26 mmol/L (ref 21–32)
EGFR (African American): 52 — ABNORMAL LOW
EGFR (Non-African Amer.): 45 — ABNORMAL LOW
Glucose: 142 mg/dL — ABNORMAL HIGH (ref 65–99)
Potassium: 4 mmol/L (ref 3.5–5.1)
Sodium: 138 mmol/L (ref 136–145)

## 2012-11-08 DIAGNOSIS — I82409 Acute embolism and thrombosis of unspecified deep veins of unspecified lower extremity: Secondary | ICD-10-CM | POA: Insufficient documentation

## 2012-11-08 HISTORY — PX: INSERTION OF VENA CAVA FILTER: SHX5871

## 2013-02-02 ENCOUNTER — Encounter: Payer: Self-pay | Admitting: Cardiovascular Disease

## 2013-02-02 ENCOUNTER — Ambulatory Visit (INDEPENDENT_AMBULATORY_CARE_PROVIDER_SITE_OTHER): Payer: Self-pay | Admitting: Cardiovascular Disease

## 2013-02-02 VITALS — BP 148/94 | HR 81 | Ht 71.0 in | Wt 257.2 lb

## 2013-02-02 DIAGNOSIS — E1140 Type 2 diabetes mellitus with diabetic neuropathy, unspecified: Secondary | ICD-10-CM | POA: Insufficient documentation

## 2013-02-02 DIAGNOSIS — E119 Type 2 diabetes mellitus without complications: Secondary | ICD-10-CM

## 2013-02-02 DIAGNOSIS — E782 Mixed hyperlipidemia: Secondary | ICD-10-CM | POA: Insufficient documentation

## 2013-02-02 DIAGNOSIS — I4891 Unspecified atrial fibrillation: Secondary | ICD-10-CM

## 2013-02-02 DIAGNOSIS — I10 Essential (primary) hypertension: Secondary | ICD-10-CM

## 2013-02-02 DIAGNOSIS — E785 Hyperlipidemia, unspecified: Secondary | ICD-10-CM

## 2013-02-02 NOTE — Assessment & Plan Note (Signed)
Blood pressure is elevated even on repeat today. We have asked him to monitor his blood pressure at home and call our office with additional numbers

## 2013-02-02 NOTE — Assessment & Plan Note (Signed)
Maintaining normal sinus rhythm. No medication changes made 

## 2013-02-02 NOTE — Assessment & Plan Note (Signed)
Encouraged him to stay on his statin. 

## 2013-02-02 NOTE — Assessment & Plan Note (Signed)
Diabetes is poorly controlled with hemoglobin A1c of 9. Weight is up. We have encouraged continued exercise, careful diet management in an effort to lose weight.

## 2013-02-02 NOTE — Progress Notes (Signed)
   Patient ID: Jeff [name], male    DOB: 1951/02/26, 62 y.o.   MRN: [id number]  HPI Comments: Jeff [name] is a 62 y/o male with h/o HTN, DM2 and hyperlipidemia with recent admission to [facility] in April for atrial fibrillation with RVR, repeat visit to the emergency room on April 15 2010 where I consult on him for rapid atrial fibrillation, s/p cardioversion,  lower extremity edema On calcium channel blockers, presents for routine followup.    He denies any significant shortness of breath. he does not feel any palpitations. Overall he has been doing well, now retired working part-time. Weight has been trending upward. He reports rare palpitations lasting less than 1 minute at a time    Echo in 02/2010 shows EF 40-45% which was felt to be tachycardia induced.  LDL 110, hemoglobin A1c 9.1, HDL 36, total cholesterol 409   EKG today shows normal sinus rhythm with rate of 81 beats per minute, no significant ST or T wave changes      Outpatient Encounter Prescriptions as of 02/02/2013  Medication Sig Dispense Refill  . lisinopril (PRINIVIL,ZESTRIL) 20 MG tablet Take 1 tablet (20 mg total) by mouth daily.  30 tablet  6  . metFORMIN (GLUMETZA) 1000 MG (MOD) 24 hr tablet Take 1,000 mg by mouth 2 (two) times daily.        . metoprolol (LOPRESSOR) 50 MG tablet Take 1 tablet (50 mg total) by mouth 2 (two) times daily.  60 tablet  3  . simvastatin (ZOCOR) 40 MG tablet Take 1 tablet (40 mg total) by mouth daily.  30 tablet  3   Review of Systems  Constitutional: Negative.   HENT: Negative.   Eyes: Negative.   Respiratory: Negative.   Cardiovascular: Negative.   Gastrointestinal: Negative.   Musculoskeletal: Negative.   Skin: Negative.   Neurological: Negative.   Psychiatric/Behavioral: Negative.   All other systems reviewed and are negative.    BP 148/94  Pulse 81  Ht 5\' 11"  (1.803 m)  Wt 257 lb 4 oz (116.688 kg)  BMI 35.9 kg/m2  Physical Exam  Nursing note and  vitals reviewed. Constitutional: He is oriented to person, place, and time. He appears well-developed and well-nourished.  obese  HENT:  Head: Normocephalic.  Nose: Nose normal.  Mouth/Throat: Oropharynx is clear and moist.  Eyes: Conjunctivae are normal. Pupils are equal, round, and reactive to light.  Neck: Normal range of motion. Neck supple. No JVD present.  Cardiovascular: Normal rate, regular rhythm, S1 normal, S2 normal, normal heart sounds and intact distal pulses.  Exam reveals no gallop and no friction rub.   No murmur heard. Pulmonary/Chest: Effort normal and breath sounds normal. No respiratory distress. He has no wheezes. He has no rales. He exhibits no tenderness.  Abdominal: Soft. Bowel sounds are normal. He exhibits no distension. There is no tenderness.  Musculoskeletal: Normal range of motion. He exhibits no edema and no tenderness.  Lymphadenopathy:    He has no cervical adenopathy.  Neurological: He is alert and oriented to person, place, and time. Coordination normal.  Skin: Skin is warm and dry. No rash noted. No erythema.  Psychiatric: He has a normal mood and affect. His behavior is normal. Judgment and thought content normal.      Assessment and Plan

## 2013-02-02 NOTE — Patient Instructions (Addendum)
  Please keep with weight down, sugars down Please watch the blood pressures  Goal blood pressure is  Less than 140 on the top, less than 90 on the bottom  Call the office or drop off blood pressure numbers  Please call us if you have new issues that need to be addressed before your next appt.  Your physician wants you to follow-up in: 6 months.  You will receive a reminder letter in the mail two months in advance. If you don't receive a letter, please call our office to schedule the follow-up appointment.

## 2013-02-27 ENCOUNTER — Telehealth: Payer: Self-pay

## 2013-02-27 NOTE — Telephone Encounter (Signed)
Call pt to assess BP readings

## 2013-02-27 NOTE — Telephone Encounter (Signed)
lmtcb

## 2013-03-06 NOTE — Telephone Encounter (Signed)
Pt reports BP's have been below 140/90 He was unable to give me exact #s but reassured me #s were staying within goal I will make Dr. Mariah Milling aware

## 2013-04-06 ENCOUNTER — Ambulatory Visit: Payer: Self-pay | Admitting: Vascular Surgery

## 2013-04-06 LAB — BASIC METABOLIC PANEL
BUN: 12 mg/dL (ref 7–18)
Chloride: 105 mmol/L (ref 98–107)
EGFR (African American): >60
Glucose: 132 mg/dL — ABNORMAL HIGH (ref 65–99)
Osmolality: 281 (ref 275–301)

## 2013-08-20 ENCOUNTER — Telehealth: Payer: Self-pay | Admitting: *Deleted

## 2013-08-20 NOTE — Telephone Encounter (Signed)
Lmtcb regarding refill request for Xarelto 20 mg due to medication not on medlist.

## 2013-08-20 NOTE — Telephone Encounter (Signed)
Pt returned call and mentioned that Dr. Lorretta Harp from [facility] prescribed Xarelto. He will contact his office for refill.

## 2014-12-30 LAB — HM DIABETES FOOT EXAM: HM DIABETIC FOOT EXAM: NORMAL

## 2014-12-30 LAB — HEMOGLOBIN A1C: HEMOGLOBIN A1C: 8.1 % — AB (ref 4.0–6.0)

## 2015-01-06 ENCOUNTER — Ambulatory Visit: Payer: Self-pay | Admitting: Cardiovascular Disease

## 2015-01-06 ENCOUNTER — Ambulatory Visit (INDEPENDENT_AMBULATORY_CARE_PROVIDER_SITE_OTHER): Payer: Medicare Other | Admitting: Cardiovascular Disease

## 2015-01-06 ENCOUNTER — Encounter: Payer: Self-pay | Admitting: Cardiovascular Disease

## 2015-01-06 VITALS — BP 130/90 | HR 93 | Ht 71.0 in | Wt 269.5 lb

## 2015-01-06 DIAGNOSIS — E1159 Type 2 diabetes mellitus with other circulatory complications: Secondary | ICD-10-CM

## 2015-01-06 DIAGNOSIS — I10 Essential (primary) hypertension: Secondary | ICD-10-CM

## 2015-01-06 DIAGNOSIS — R609 Edema, unspecified: Secondary | ICD-10-CM

## 2015-01-06 DIAGNOSIS — E785 Hyperlipidemia, unspecified: Secondary | ICD-10-CM

## 2015-01-06 DIAGNOSIS — I4891 Unspecified atrial fibrillation: Secondary | ICD-10-CM

## 2015-01-06 MED ORDER — RIVAROXABAN 20 MG PO TABS: 20.0000 mg | ORAL_TABLET | Freq: Every day | ORAL | Status: DC

## 2015-01-06 NOTE — Assessment & Plan Note (Signed)
Encouraged him to stay on his simvastatin. 

## 2015-01-06 NOTE — Progress Notes (Signed)
Patient ID: Jeff [name], male    DOB: 23-Apr-1951, 64 y.o.   MRN: [id number]  HPI Comments: Jeff [name] is a 64 y/o male with h/o HTN, DM2 and hyperlipidemia with admission to [facility] in April for atrial fibrillation with RVR, repeat visit to the emergency room on April 15 2010  for rapid atrial fibrillation, where he underwent s/p cardioversion at that time he was having  lower extremity edema On calcium channel blockers,  today he presents for routine followup for atrial fibrillation.  In follow-up today, he reports that he is feeling well. Denies any leg edema, shortness of breath typically goes dancing on the weekends He was seen by primary care and found to be in atrial fibrillation. It is unclear when this started for him. He is currently not on anticoagulation. Notes indicate hemoglobin A1c 8.1, creatinine 1.51, BUN 24, potassium 4.4, GFR 48 He continues to work part-time at [employer]  EKG on today's visit shows atrial fibrillation with ventricular rate 93 bpm.    Echo in 02/2010 shows EF 40-45% which was felt to be tachycardia induced.  LDL 110, hemoglobin A1c 9.1, HDL 36, total cholesterol 409     No Known Allergies  Outpatient Encounter Prescriptions as of 01/06/2015  Medication Sig  . lisinopril (PRINIVIL,ZESTRIL) 20 MG tablet Take 1 tablet (20 mg total) by mouth daily.  . metFORMIN (GLUMETZA) 1000 MG (MOD) 24 hr tablet Take 1,000 mg by mouth 2 (two) times daily.    . metoprolol (LOPRESSOR) 50 MG tablet Take 1 tablet (50 mg total) by mouth 2 (two) times daily.  . simvastatin (ZOCOR) 40 MG tablet Take 1 tablet (40 mg total) by mouth daily.  . canagliflozin (INVOKANA) 100 MG TABS tablet Take 1 tablet (100 mg total) by mouth.  . rivaroxaban (XARELTO) 20 MG TABS tablet Take 1 tablet (20 mg total) by mouth daily with supper.    Past Medical History  Diagnosis Date  . Atrial fibrillation     dx'd 4/11  . Acute systolic heart failure     with EF of 40-45% by  echocardiogram, liekly tachycardia induced cariomyopathy  . Diabetes mellitus   . HTN (hypertension)   . HLD (hyperlipidemia)   . Chronic low back pain     History reviewed. No pertinent past surgical history.  Social History  reports that he has quit smoking. He does not have any smokeless tobacco history on file. He reports that he does not drink alcohol or use illicit drugs.  Family History Family history is unknown by patient.   Review of Systems  Constitutional: Negative.   Respiratory: Negative.   Cardiovascular: Negative.   Gastrointestinal: Negative.   Musculoskeletal: Negative.   Skin: Negative.   Neurological: Negative.   Psychiatric/Behavioral: Negative.   All other systems reviewed and are negative.   BP 130/90 mmHg  Pulse 93  Ht  (1.803 m)  Wt 269 lb 8 oz (122.244 kg)  BMI 37.60 kg/m2  Physical Exam  Constitutional: He is oriented to person, place, and time. He appears well-developed and well-nourished.  obese  HENT:  Head: Normocephalic.  Nose: Nose normal.  Mouth/Throat: Oropharynx is clear and moist.  Eyes: Conjunctivae are normal. Pupils are equal, round, and reactive to light.  Neck: Normal range of motion. Neck supple. No JVD present.  Cardiovascular: Normal rate, regular rhythm, S1 normal, S2 normal, normal heart sounds and intact distal pulses.  Exam reveals no gallop and no friction rub.   No  murmur heard. Pulmonary/Chest: Effort normal and breath sounds normal. No respiratory distress. He has no wheezes. He has no rales. He exhibits no tenderness.  Abdominal: Soft. Bowel sounds are normal. He exhibits no distension. There is no tenderness.  Musculoskeletal: Normal range of motion. He exhibits no edema or tenderness.  Lymphadenopathy:    He has no cervical adenopathy.  Neurological: He is alert and oriented to person, place, and time. Coordination normal.  Skin: Skin is warm and dry. No rash noted. No erythema.  Psychiatric: He has a  normal mood and affect. His behavior is normal. Judgment and thought content normal.      Assessment and Plan   Nursing note and vitals reviewed.

## 2015-01-06 NOTE — Patient Instructions (Signed)
You are doing well.  Please start xarelto one a day (blood thinner)  Please call the office if you have shortness of breath, leg swelling  Please call us if you have new issues that need to be addressed before your next appt.  Your physician wants you to follow-up in: 1 month.

## 2015-01-06 NOTE — Assessment & Plan Note (Signed)
We have encouraged continued exercise, careful diet management in an effort to lose weight. 

## 2015-01-06 NOTE — Assessment & Plan Note (Signed)
Lower extremity edema likely from venous insufficiency. Suggested he call our office if edema gets worse as we could start him on Lasix when necessary

## 2015-01-06 NOTE — Assessment & Plan Note (Signed)
Blood pressure is well controlled on today's visit. No changes made to the medications. 

## 2015-01-06 NOTE — Assessment & Plan Note (Signed)
We have discussed the various treatment options with him. For now we will start him on anticoagulation, xarelto 20 mg daily.  In one month's time we will discuss treatment options such as pharmacologic cardioversion with amiodarone

## 2015-01-27 LAB — HM DIABETES EYE EXAM

## 2015-02-03 ENCOUNTER — Encounter: Payer: Self-pay | Admitting: Cardiovascular Disease

## 2015-02-03 ENCOUNTER — Ambulatory Visit (INDEPENDENT_AMBULATORY_CARE_PROVIDER_SITE_OTHER): Payer: Medicare Other | Admitting: Cardiovascular Disease

## 2015-02-03 VITALS — BP 118/80 | HR 77 | Ht 71.0 in | Wt 272.8 lb

## 2015-02-03 DIAGNOSIS — E785 Hyperlipidemia, unspecified: Secondary | ICD-10-CM | POA: Diagnosis not present

## 2015-02-03 DIAGNOSIS — R609 Edema, unspecified: Secondary | ICD-10-CM

## 2015-02-03 DIAGNOSIS — I10 Essential (primary) hypertension: Secondary | ICD-10-CM

## 2015-02-03 DIAGNOSIS — I420 Dilated cardiomyopathy: Secondary | ICD-10-CM

## 2015-02-03 DIAGNOSIS — I4891 Unspecified atrial fibrillation: Secondary | ICD-10-CM

## 2015-02-03 DIAGNOSIS — E1159 Type 2 diabetes mellitus with other circulatory complications: Secondary | ICD-10-CM

## 2015-02-03 NOTE — Assessment & Plan Note (Signed)
Blood pressure is well controlled on today's visit. No changes made to the medications. 

## 2015-02-03 NOTE — Assessment & Plan Note (Signed)
Long discussion today concerning his various treatment options for his atrial fibrillation. He is asymptomatic even with heavy activity. He prefers no cardioversion at this time. He will stay on anticoagulation. If he has any symptoms over the next several months, he will call our office. We did discuss with him that without activity, he would likely remain in chronic atrial fibrillation. We did discuss stopping and starting anticoagulation for various procedures and the risk associated with this in terms of stroke. We have strongly suggested if he is interested in cardioversion, that he not wait very long

## 2015-02-03 NOTE — Assessment & Plan Note (Signed)
Recommended that he stay on his simvastatin, try to lose weight, work on his sugars

## 2015-02-03 NOTE — Assessment & Plan Note (Signed)
No significant edema on today's visit. Some signs of venous insufficiency

## 2015-02-03 NOTE — Patient Instructions (Signed)
You are doing well. No medication changes were made.  Call the office if you have leg swelling, shortness of breath  Please call us if you have new issues that need to be addressed before your next appt.  Your physician wants you to follow-up in: 6 months.  You will receive a reminder letter in the mail two months in advance. If you don't receive a letter, please call our office to schedule the follow-up appointment.

## 2015-02-03 NOTE — Assessment & Plan Note (Signed)
Asymptomatic, appears euvolemic. Last echocardiogram apparently was 2011 with ejection fraction 40-45%. He is very active at baseline with no symptoms. No further testing at this time

## 2015-02-03 NOTE — Progress Notes (Signed)
Patient ID: Jeff [name], male    DOB: 13-Aug-1951, 64 y.o.   MRN: [id number]  HPI Comments: Jeff [name] is a 64 y/o male with h/o HTN, DM2 and hyperlipidemia with admission to [facility] in April for atrial fibrillation with RVR, repeat visit to the emergency room on April 15 2010  for rapid atrial fibrillation, where he underwent s/p cardioversion at that time he was having  lower extremity edema On calcium channel blockers,  today he presents for routine followup for atrial fibrillation.  In follow-up today, he reports that he is feeling well. Denies any leg edema, shortness of breath typically goes dancing on the weekends Recently moved heavy items out of his basement with no symptoms of shortness of breath or chest discomfort. He is tolerating anticoagulation, xarelto He does dancing on weekends with no symptoms  EKG shows atrial fibrillation with ventricular rate in the 70s, no significant ST or T-wave changes  Prior lab work Notes indicate hemoglobin A1c 8.1, creatinine 1.51, BUN 24, potassium 4.4, GFR 48 He continues to work part-time at [employer] in 02/2010 shows EF 40-45% which was felt to be tachycardia induced.  Prior lab work LDL 110, hemoglobin A1c 9.1, HDL 36, total cholesterol 829     No Known Allergies  Outpatient Encounter Prescriptions as of 02/03/2015  Medication Sig  . canagliflozin (INVOKANA) 100 MG TABS tablet Take 1 tablet (100 mg total) by mouth.  Marland Kitchen lisinopril (PRINIVIL,ZESTRIL) 20 MG tablet Take 1 tablet (20 mg total) by mouth daily.  . metFORMIN (GLUMETZA) 1000 MG (MOD) 24 hr tablet Take 1,000 mg by mouth 2 (two) times daily.    . metoprolol (LOPRESSOR) 50 MG tablet Take 1 tablet (50 mg total) by mouth 2 (two) times daily.  . rivaroxaban (XARELTO) 20 MG TABS tablet Take 1 tablet (20 mg total) by mouth daily with supper.  . simvastatin (ZOCOR) 40 MG tablet Take 1 tablet (40 mg total) by mouth daily.    Past Medical History  Diagnosis  Date  . Atrial fibrillation     dx'd 4/11  . Acute systolic heart failure     with EF of 40-45% by echocardiogram, liekly tachycardia induced cariomyopathy  . Diabetes mellitus   . HTN (hypertension)   . HLD (hyperlipidemia)   . Chronic low back pain     History reviewed. No pertinent past surgical history.  Social History  reports that he has quit smoking. He does not have any smokeless tobacco history on file. He reports that he does not drink alcohol or use illicit drugs.  Family History Family history is unknown by patient.   Review of Systems  Constitutional: Negative.   Respiratory: Negative.   Cardiovascular: Negative.   Gastrointestinal: Negative.   Musculoskeletal: Negative.   Skin: Negative.   Neurological: Negative.   Psychiatric/Behavioral: Negative.   All other systems reviewed and are negative.   BP 118/80 mmHg  Pulse 77  Ht  (1.803 m)  Wt 272 lb 12 oz (123.719 kg)  BMI 38.06 kg/m2  Physical Exam  Constitutional: He is oriented to person, place, and time. He appears well-developed and well-nourished.  obese  HENT:  Head: Normocephalic.  Nose: Nose normal.  Mouth/Throat: Oropharynx is clear and moist.  Eyes: Conjunctivae are normal. Pupils are equal, round, and reactive to light.  Neck: Normal range of motion. Neck supple. No JVD present.  Cardiovascular: Normal rate, S1 normal, S2 normal, normal heart sounds and intact distal pulses.  An irregularly irregular rhythm present. Exam reveals no gallop and no friction rub.   No murmur heard. Pulmonary/Chest: Effort normal and breath sounds normal. No respiratory distress. He has no wheezes. He has no rales. He exhibits no tenderness.  Abdominal: Soft. Bowel sounds are normal. He exhibits no distension. There is no tenderness.  Musculoskeletal: Normal range of motion. He exhibits no edema or tenderness.  Lymphadenopathy:    He has no cervical adenopathy.  Neurological: He is alert and oriented to  person, place, and time. Coordination normal.  Skin: Skin is warm and dry. No rash noted. No erythema.  Psychiatric: He has a normal mood and affect. His behavior is normal. Judgment and thought content normal.      Assessment and Plan   Nursing note and vitals reviewed.

## 2015-02-03 NOTE — Assessment & Plan Note (Signed)
We have encouraged continued exercise, careful diet management in an effort to lose weight. 

## 2015-02-28 NOTE — Op Note (Signed)
PATIENT NAME:  Jeff [name], Jeff [name] MR#:  [id number] DATE OF BIRTH:  1951-09-16  DATE OF PROCEDURE:  04/06/2013  PREOPERATIVE DIAGNOSIS: Deep venous thrombosis, left lower extremity, symptomatic.   POSTOPERATIVE DIAGNOSIS: Deep venous thrombosis, left lower extremity, symptomatic.   PROCEDURES PERFORMED:  1. Inferior venacavogram.  2. Placement of infrarenal inferior vena caval filter, Meridian type.  3. Mechanical thrombectomy using the Trellis device, left superficial femoral vein.  4. Tissue plasminogen activator (TPA) infusion for thrombolysis, left superficial femoral vein.  5. Percutaneous transluminal angioplasty to 8 mm, left superficial femoral vein.   SURGEON: Renford DillsGregory G. Emiel Kielty, M.D.   SEDATION: Versed 6 mg plus fentanyl 300 mcg administered IV. Continuous ECG, pulse oximetry and cardiopulmonary monitoring was performed throughout the entire procedure by the interventional radiology nurse. Total sedation time was approximately 1 hour and 30 minutes.   ACCESS:  1. [name] 9-French sheath, right common femoral vein for placement of [name] filter.  2. An 8-French sheath, left popliteal vein for treatment of the DVT.    CONTRAST USED: Isovue 46 mL.   FLUOROSCOPY TIME: 6.3 minutes.   INDICATIONS: [name] is [name] year old gentleman who presented to the office with pain and significant swelling associated with an acute DVT of the left lower extremity. Risks and benefits were reviewed for thrombolysis. All questions answered. The patient agrees to proceed.   DESCRIPTION OF PROCEDURE: The patient is taken to special procedures and placed in the supine position. After adequate sedation is achieved, the right groin is prepped and draped in [name] sterile fashion. Ultrasound is placed in [name] sterile sleeve. Ultrasound is utilized to avoid injury and to define appropriate landmarks. Under direct ultrasound visualization, the common femoral vein is identified. It is echolucent and compressible, indicating patency.  Image is recorded for the permanent record. [name] Seldinger needle was then used to access the vein under direct ultrasound visualization. J-wire is advanced under fluoroscopy. Dilator is passed over the wire and then the delivery sheath is advanced to the iliac confluence. Bolus injection of contrast is used to demonstrate the IVC, which is patent, and bilateral renal blushes are noted at the L1-L2 level. Wire is reintroduced, and the catheter is advanced just below the L1-L2 level. Meridian filter is then advanced uncovered just below the renal veins and deployed without difficulty.   After the sheath is pulled and pressure held, the patient is positioned prone, and the left popliteal fossa is prepped and draped in [name] sterile fashion. Ultrasound is again utilized. [name] micropuncture needle is used to access the popliteal vein. The material within the popliteal vein is heterogeneous. The popliteal vein is enlarged and noncompressible consistent with subacute thrombus. Microwire is then advanced followed by microsheath. [name] small hand injection of contrast demonstrates intraluminal positioning with thrombus present, and, therefore, [name] Magic Torque wire is advanced through the sheath. An 8-French sheath is placed and [name] Kumpe catheter is advanced up to the common femoral vein, subsequently up to the common iliac vein. Hand injection of contrast is utilized to demonstrate the patency of the left iliac system as well as the common femoral. As the catheter is pulled into the superficial femoral, thrombus is then identified.   Trellis device is then opened onto the field, prepped, advanced and the distal balloon is placed in the common femoral, proximal balloon is in the popliteal. Balloons were inflated as directed until there is subtle flattening of both sides. Total of 14 mg of TPA is then infused, reversing the direction of the  oscillating wire at 1 minute intervals. Total infusion time and treatment time was 24 minutes.  Followup angiography demonstrates [name] fairly good improvement, but there is still debris and the wire is reintroduced. An 8 x 6 balloon is then used to angioplasty serially the SFV and the proximal popliteal vein. This also demonstrates now improved patency, although at the Hunter's canal area, there remains some significant material, and, therefore, an AngioJet DBX device is used to treat this, with again [name] fairly good result.   Final angiography demonstrates significant improvement with patency of the superficial femoral vein. During this time, the patient was given several 1000 unit boluses of heparin. He had been off his Xarelto for only one 24-hour period.   The sheath is removed. Pressure is held. The patient is then positioned supine, transferred to [name] gurney and taken to the recovery area where he is in stable condition.   INTERPRETATION: Initial views demonstrate the cava is widely patent. It measures approximately 24 mm in diameter, and [name] Meridian filter is deployed at approximately L2 level with excellent orientation.   The left common external and common femoral veins are widely patent. Superficial femoral vein demonstrates thrombus, and this is treated as described above with significant improvement.   SUMMARY: Successful debulking of left leg DVT. The patient will continue his Xarelto when he gets home. He will undergo evaluation for removal of his filter in approximately 3 to 4 months.    ____________________________ Renford Dills, MD ggs:gb D: 04/08/2013 15:04:56 ET T: 04/09/2013 00:16:18 ET JOB#: [id number]  cc: Renford Dills, MD, <Dictator> Steele Sizer, MD Renford Dills MD ELECTRONICALLY SIGNED 04/11/2013 11:04

## 2015-03-29 ENCOUNTER — Encounter: Payer: Self-pay | Admitting: *Deleted

## 2015-03-29 ENCOUNTER — Other Ambulatory Visit: Payer: Self-pay | Admitting: *Deleted

## 2015-03-29 DIAGNOSIS — E291 Testicular hypofunction: Secondary | ICD-10-CM | POA: Insufficient documentation

## 2015-03-29 DIAGNOSIS — N529 Male erectile dysfunction, unspecified: Secondary | ICD-10-CM | POA: Insufficient documentation

## 2015-04-16 ENCOUNTER — Other Ambulatory Visit: Payer: Self-pay | Admitting: Family Medicine

## 2015-04-17 ENCOUNTER — Encounter: Payer: Self-pay | Admitting: *Deleted

## 2015-04-17 NOTE — Telephone Encounter (Signed)
Pt needs 90 day refills on Atorvastatin, Metformin, Metoprolol, Amlodepine, and Lisinopril. Pharm is [facility] on [address].

## 2015-04-20 ENCOUNTER — Other Ambulatory Visit: Payer: Self-pay | Admitting: Urology

## 2015-04-20 DIAGNOSIS — E291 Testicular hypofunction: Secondary | ICD-10-CM

## 2015-04-20 DIAGNOSIS — N138 Other obstructive and reflux uropathy: Secondary | ICD-10-CM

## 2015-04-20 DIAGNOSIS — N401 Enlarged prostate with lower urinary tract symptoms: Principal | ICD-10-CM

## 2015-04-21 ENCOUNTER — Ambulatory Visit: Payer: Self-pay | Admitting: Urology

## 2015-04-21 ENCOUNTER — Telehealth: Payer: Self-pay | Admitting: Urology

## 2015-04-21 NOTE — Telephone Encounter (Signed)
Patient needs follow up appointment on his diabetes.

## 2015-04-21 NOTE — Telephone Encounter (Signed)
Please call patient to reschedule his missed appointment.  

## 2015-04-22 NOTE — Telephone Encounter (Signed)
Pt stated he was unaware of appt. Stated he will call back in July to make an appt. Cw,lpn

## 2015-05-22 ENCOUNTER — Ambulatory Visit (INDEPENDENT_AMBULATORY_CARE_PROVIDER_SITE_OTHER): Payer: BLUE CROSS/BLUE SHIELD | Admitting: Family Medicine

## 2015-05-22 ENCOUNTER — Encounter: Payer: Self-pay | Admitting: Family Medicine

## 2015-05-22 ENCOUNTER — Other Ambulatory Visit: Payer: Self-pay | Admitting: Family Medicine

## 2015-05-22 VITALS — BP 138/84 | HR 89 | Temp 97.9°F | Ht 69.2 in | Wt 274.0 lb

## 2015-05-22 DIAGNOSIS — E1165 Type 2 diabetes mellitus with hyperglycemia: Secondary | ICD-10-CM | POA: Diagnosis not present

## 2015-05-22 DIAGNOSIS — E785 Hyperlipidemia, unspecified: Secondary | ICD-10-CM

## 2015-05-22 DIAGNOSIS — I10 Essential (primary) hypertension: Secondary | ICD-10-CM | POA: Diagnosis not present

## 2015-05-22 MED ORDER — RIVAROXABAN 20 MG PO TABS: 20.0000 mg | ORAL_TABLET | Freq: Every day | ORAL | Status: DC

## 2015-05-22 MED ORDER — METOPROLOL TARTRATE 50 MG PO TABS: 50.0000 mg | ORAL_TABLET | Freq: Two times a day (BID) | ORAL | Status: DC

## 2015-05-22 MED ORDER — CANAGLIFLOZIN 100 MG PO TABS: 100.0000 mg | ORAL_TABLET | Freq: Every day | ORAL | Status: DC

## 2015-05-22 MED ORDER — ATORVASTATIN CALCIUM 10 MG PO TABS: 10.0000 mg | ORAL_TABLET | Freq: Every day | ORAL | Status: DC

## 2015-05-22 MED ORDER — LISINOPRIL 20 MG PO TABS: 20.0000 mg | ORAL_TABLET | Freq: Every day | ORAL | Status: DC

## 2015-05-22 MED ORDER — AMLODIPINE BESYLATE 10 MG PO TABS: 10.0000 mg | ORAL_TABLET | Freq: Every day | ORAL | Status: DC

## 2015-05-22 MED ORDER — ASPIRIN EC 81 MG PO TBEC: 81.0000 mg | DELAYED_RELEASE_TABLET | Freq: Every day | ORAL | Status: DC

## 2015-05-22 MED ORDER — METFORMIN HCL ER (MOD) 1000 MG PO TB24: 1000.0000 mg | ORAL_TABLET | Freq: Two times a day (BID) | ORAL | Status: DC

## 2015-05-22 NOTE — Assessment & Plan Note (Signed)
Sugars doing better, but having an issue with [facility]. Would like to hold on A1c at this time. Will recheck next visit. Continue current regimen, continue to monitor.

## 2015-05-22 NOTE — Assessment & Plan Note (Signed)
Has been stable on current regimen. Recheck due in 3 months. Will check at that time. Continue current regimen. Continue to monitor.

## 2015-05-22 NOTE — Assessment & Plan Note (Signed)
Better on recheck. Continue current regimen. Continue to monitor.  

## 2015-05-22 NOTE — Progress Notes (Signed)
BP 138/84 mmHg  Pulse 89  Temp(Src) 97.9 F (36.6 C)  Ht 5' 9.2" (1.758 m)  Wt 274 lb (124.286 kg)  BMI 40.21 kg/m2  SpO2 99%   Subjective:    Patient ID: Jeff [name], male    DOB: Apr 27, 1951, 64 y.o.   MRN: [id number]  HPI: Jeff [name] is a 64 y.o. male  Chief Complaint  Patient presents with  . Diabetes  . Hyperlipidemia  . Hypertension   HYPERTENSION / HYPERLIPIDEMIA Satisfied with current treatment? yes Duration of hypertension: chronic BP monitoring frequency: not checking BP medication side effects: no Duration of hyperlipidemia: chronic Cholesterol medication side effects: no Cholesterol supplements: none Medication compliance: excellent compliance Aspirin: yes Recent stressors: yes Recurrent headaches: no Visual changes: no Palpitations: yes Dyspnea: no Chest pain: no Lower extremity edema: yes Dizzy/lightheaded: yes  DIABETES Hypoglycemic episodes:no Polydipsia/polyuria: no Visual disturbance: no Chest pain: no Paresthesias: no Glucose Monitoring: yes  Accucheck frequency: Daily  Fasting glucose: 120-140s Taking Insulin?: no Blood Pressure Monitoring: not checking Retinal Examination: Up to Date Foot Exam: Up to Date Diabetic Education: Completed Pneumovax: Up to Date Influenza: Up to Date Aspirin: yes  Relevant past medical, surgical, family and social history reviewed and updated as indicated. Interim medical history since our last visit reviewed. Allergies and medications reviewed and updated.  Review of Systems  Constitutional: Negative.   Respiratory: Negative.   Cardiovascular: Negative.   Gastrointestinal: Negative.   Genitourinary: Negative.   Musculoskeletal: Negative.   Psychiatric/Behavioral: Negative.    Per HPI unless specifically indicated above    Objective:    BP 138/84 mmHg  Pulse 89  Temp(Src) 97.9 F (36.6 C)  Ht 5' 9.2" (1.758 m)  Wt 274 lb (124.286 kg)  BMI 40.21 kg/m2  SpO2 99%  Wt Readings from  Last 3 Encounters:  05/22/15 274 lb (124.286 kg)  02/03/15 272 lb 12 oz (123.719 kg)  01/06/15 269 lb 8 oz (122.244 kg)    Physical Exam  Constitutional: He is oriented to person, place, and time. He appears well-developed and well-nourished. No distress.  HENT:  Head: Normocephalic and atraumatic.  Right Ear: Hearing normal.  Left Ear: Hearing normal.  Nose: Nose normal.  Eyes: Conjunctivae and lids are normal. Right eye exhibits no discharge. Left eye exhibits no discharge. No scleral icterus.  Cardiovascular: Normal rate, regular rhythm, normal heart sounds and intact distal pulses.  Exam reveals no gallop and no friction rub.   No murmur heard. Pulmonary/Chest: Effort normal and breath sounds normal. No respiratory distress. He has no wheezes. He has no rales. He exhibits no tenderness.  Musculoskeletal: Normal range of motion.  Neurological: He is alert and oriented to person, place, and time.  Skin: Skin is warm, dry and intact. No rash noted. No erythema. No pallor.  Psychiatric: He has a normal mood and affect. His speech is normal and behavior is normal. Judgment and thought content normal. Cognition and memory are normal.   Results for orders placed or performed in visit on 05/22/15  Hemoglobin A1c  Result Value Ref Range   Hgb A1c MFr Bld 8.1 (A) 4.0 - 6.0 %  HM DIABETES EYE EXAM  Result Value Ref Range   HM Diabetic Eye Exam No Retinopathy No Retinopathy  HM DIABETES FOOT EXAM  Result Value Ref Range   HM Diabetic Foot Exam normal       Assessment & Plan:   Problem List Items Addressed This Visit      Cardiovascular  and Mediastinum   HYPERTENSION, BENIGN - Primary    Better on recheck. Continue current regimen. Continue to monitor.       Relevant Medications   amLODipine (NORVASC) 10 MG tablet   aspirin EC 81 MG tablet   atorvastatin (LIPITOR) 10 MG tablet   lisinopril (PRINIVIL,ZESTRIL) 20 MG tablet   metoprolol (LOPRESSOR) 50 MG tablet   rivaroxaban  (XARELTO) 20 MG TABS tablet     Endocrine   Diabetes    Sugars doing better, but having an issue with [facility]. Would like to hold on A1c at this time. Will recheck next visit. Continue current regimen, continue to monitor.       Relevant Medications   aspirin EC 81 MG tablet   atorvastatin (LIPITOR) 10 MG tablet   canagliflozin (INVOKANA) 100 MG TABS tablet   lisinopril (PRINIVIL,ZESTRIL) 20 MG tablet   metFORMIN (GLUMETZA) 1000 MG (MOD) 24 hr tablet     Other   Hyperlipidemia    Has been stable on current regimen. Recheck due in 3 months. Will check at that time. Continue current regimen. Continue to monitor.       Relevant Medications   amLODipine (NORVASC) 10 MG tablet   aspirin EC 81 MG tablet   atorvastatin (LIPITOR) 10 MG tablet   lisinopril (PRINIVIL,ZESTRIL) 20 MG tablet   metoprolol (LOPRESSOR) 50 MG tablet   rivaroxaban (XARELTO) 20 MG TABS tablet       Follow up plan: Return in about 3 months (around 08/22/2015) for DM/HTN/HLD follow up.

## 2015-05-23 ENCOUNTER — Other Ambulatory Visit: Payer: Self-pay | Admitting: Family Medicine

## 2015-05-27 ENCOUNTER — Telehealth: Payer: Self-pay

## 2015-05-27 NOTE — Telephone Encounter (Signed)
Pharmacy called to confirm prescription of Metformin. The extended release was sent in, which is thousands of dollars, I spoke with Dr.Johnson, she said to do the Metformin HCL  BID.

## 2015-09-01 ENCOUNTER — Encounter: Payer: Self-pay | Admitting: Family Medicine

## 2015-09-01 ENCOUNTER — Ambulatory Visit (INDEPENDENT_AMBULATORY_CARE_PROVIDER_SITE_OTHER): Payer: BLUE CROSS/BLUE SHIELD | Admitting: Family Medicine

## 2015-09-01 VITALS — BP 102/68 | HR 76 | Temp 97.6°F | Ht 70.0 in | Wt 269.0 lb

## 2015-09-01 DIAGNOSIS — E1159 Type 2 diabetes mellitus with other circulatory complications: Secondary | ICD-10-CM

## 2015-09-01 DIAGNOSIS — E1165 Type 2 diabetes mellitus with hyperglycemia: Secondary | ICD-10-CM | POA: Diagnosis not present

## 2015-09-01 DIAGNOSIS — Z23 Encounter for immunization: Secondary | ICD-10-CM | POA: Diagnosis not present

## 2015-09-01 DIAGNOSIS — Z0000 Encounter for general adult medical examination without abnormal findings: Secondary | ICD-10-CM

## 2015-09-01 DIAGNOSIS — E785 Hyperlipidemia, unspecified: Secondary | ICD-10-CM | POA: Diagnosis not present

## 2015-09-01 DIAGNOSIS — I10 Essential (primary) hypertension: Secondary | ICD-10-CM

## 2015-09-01 LAB — URINALYSIS, ROUTINE W REFLEX MICROSCOPIC
BILIRUBIN UA: NEGATIVE
Leukocytes, UA: NEGATIVE
Nitrite, UA: NEGATIVE
PH UA: 5 (ref 5.0–7.5)
PROTEIN UA: NEGATIVE
RBC UA: NEGATIVE
Specific Gravity, UA: 1.01 (ref 1.005–1.030)
UUROB: 0.2 mg/dL (ref 0.2–1.0)

## 2015-09-01 LAB — MICROSCOPIC EXAMINATION
Epithelial Cells (non renal): NONE SEEN /hpf (ref 0–10)
RBC MICROSCOPIC, UA: NONE SEEN /HPF (ref 0–?)
WBC UA: NONE SEEN /HPF (ref 0–?)

## 2015-09-01 LAB — LIPID PANEL PICCOLO, WAIVED
CHOL/HDL RATIO PICCOLO,WAIVE: 4.5 mg/dL
CHOLESTEROL PICCOLO, WAIVED: 134 mg/dL (ref <200)
HDL CHOL PICCOLO, WAIVED: 30 mg/dL — AB (ref >59)
LDL CHOL CALC PICCOLO WAIVED: 74 mg/dL (ref <100)
TRIGLYCERIDES PICCOLO,WAIVED: 151 mg/dL — AB (ref <150)
VLDL CHOL CALC PICCOLO,WAIVE: 30 mg/dL — AB (ref <30)

## 2015-09-01 LAB — BAYER DCA HB A1C WAIVED: HB A1C: 7.6 % — AB (ref <7.0)

## 2015-09-01 MED ORDER — AMLODIPINE BESYLATE 10 MG PO TABS: 10.0000 mg | ORAL_TABLET | Freq: Every day | ORAL | Status: DC

## 2015-09-01 MED ORDER — METOPROLOL TARTRATE 50 MG PO TABS: 50.0000 mg | ORAL_TABLET | Freq: Two times a day (BID) | ORAL | Status: DC

## 2015-09-01 MED ORDER — METFORMIN HCL ER (MOD) 1000 MG PO TB24: 1000.0000 mg | ORAL_TABLET | Freq: Two times a day (BID) | ORAL | Status: DC

## 2015-09-01 MED ORDER — LISINOPRIL 20 MG PO TABS: 20.0000 mg | ORAL_TABLET | Freq: Every day | ORAL | Status: DC

## 2015-09-01 MED ORDER — ATORVASTATIN CALCIUM 10 MG PO TABS: 10.0000 mg | ORAL_TABLET | Freq: Every day | ORAL | Status: DC

## 2015-09-01 MED ORDER — CANAGLIFLOZIN 100 MG PO TABS: 100.0000 mg | ORAL_TABLET | Freq: Every day | ORAL | Status: DC

## 2015-09-01 MED ORDER — RIVAROXABAN 20 MG PO TABS: 20.0000 mg | ORAL_TABLET | Freq: Every day | ORAL | Status: DC

## 2015-09-01 NOTE — Progress Notes (Signed)
BP 102/68 mmHg  Pulse 76  Temp(Src) 97.6 F (36.4 C)  Ht 5\' 10"  (1.778 m)  Wt 269 lb (122.018 kg)  BMI 38.60 kg/m2  SpO2 95%   Subjective:    Patient ID: Jeff LynchNed Allen [name], male    DOB: Nov 24, 1950, 64 y.o.   MRN: [id number]  HPI: Jeff Lynched Allen Keelan is a 64 y.o. male  No chief complaint on file. for PE  patient doing well all in all with diabetes, and noted low blood sugar spells side effects from medications Blood pressure doing well on medications Lipids doing well on medications takes faithfully No issues with Xarelto no extra bruising bleeding or changes patient's notice. On the A. fib is being followed by cardiology and stable.  Relevant past medical, surgical, family and social history reviewed and updated as indicated. Interim medical history since our last visit reviewed. Allergies and medications reviewed and updated.  Review of Systems  Constitutional: Negative.   HENT: Negative.   Eyes: Negative.   Respiratory: Negative.   Cardiovascular: Negative.   Gastrointestinal: Negative.   Endocrine: Negative.   Genitourinary: Negative.   Musculoskeletal: Negative.   Skin: Negative.   Allergic/Immunologic: Negative.   Neurological: Negative.   Hematological: Negative.   Psychiatric/Behavioral: Negative.     Per HPI unless specifically indicated above     Objective:    BP 102/68 mmHg  Pulse 76  Temp(Src) 97.6 F (36.4 C)  Ht 5\' 10"  (1.778 m)  Wt 269 lb (122.018 kg)  BMI 38.60 kg/m2  SpO2 95%  Wt Readings from Last 3 Encounters:  09/01/15 269 lb (122.018 kg)  05/22/15 274 lb (124.286 kg)  02/03/15 272 lb 12 oz (123.719 kg)    Physical Exam  Constitutional: He is oriented to person, place, and time. He appears well-developed and well-nourished.  HENT:  Head: Normocephalic and atraumatic.  Right Ear: External ear normal.  Left Ear: External ear normal.  Eyes: Conjunctivae and EOM are normal. Pupils are equal, round, and reactive to light.  Neck: Normal  range of motion. Neck supple.  Cardiovascular: Normal rate, regular rhythm, normal heart sounds and intact distal pulses.   Pulmonary/Chest: Effort normal and breath sounds normal.  Abdominal: Soft. Bowel sounds are normal. There is no splenomegaly or hepatomegaly.  Genitourinary: Rectum normal and penis normal.  Prostate enlarged  Musculoskeletal: Normal range of motion.  Neurological: He is alert and oriented to person, place, and time. He has normal reflexes.  Skin: No rash noted. No erythema.  Psychiatric: He has a normal mood and affect. His behavior is normal. Judgment and thought content normal.    Results for orders placed or performed in visit on 05/22/15  Hemoglobin A1c  Result Value Ref Range   Hgb A1c MFr Bld 8.1 (A) 4.0 - 6.0 %  HM DIABETES EYE EXAM  Result Value Ref Range   HM Diabetic Eye Exam No Retinopathy No Retinopathy  HM DIABETES FOOT EXAM  Result Value Ref Range   HM Diabetic Foot Exam normal       Assessment & Plan:   Problem List Items Addressed This Visit      Cardiovascular and Mediastinum   HYPERTENSION, BENIGN    The current medical regimen is effective;  continue present plan and medications.       Relevant Medications   amLODipine (NORVASC) 10 MG tablet   atorvastatin (LIPITOR) 10 MG tablet   lisinopril (PRINIVIL,ZESTRIL) 20 MG tablet   metoprolol (LOPRESSOR) 50 MG tablet   rivaroxaban (XARELTO)  20 MG TABS tablet     Endocrine   Diabetes (HCC)    Discussed poor control and to diet and exercise      Relevant Medications   atorvastatin (LIPITOR) 10 MG tablet   canagliflozin (INVOKANA) 100 MG TABS tablet   lisinopril (PRINIVIL,ZESTRIL) 20 MG tablet   metFORMIN (GLUMETZA) 1000 MG (MOD) 24 hr tablet     Other   Hyperlipidemia   Relevant Medications   amLODipine (NORVASC) 10 MG tablet   atorvastatin (LIPITOR) 10 MG tablet   lisinopril (PRINIVIL,ZESTRIL) 20 MG tablet   metoprolol (LOPRESSOR) 50 MG tablet   rivaroxaban (XARELTO) 20 MG  TABS tablet    Other Visit Diagnoses    Immunization due    -  Primary    Relevant Orders    Flu Vaccine QUAD 36+ mos PF IM (Fluarix & Fluzone Quad PF) (Completed)    Type 2 diabetes mellitus with hyperglycemia (HCC)        Relevant Medications    atorvastatin (LIPITOR) 10 MG tablet    canagliflozin (INVOKANA) 100 MG TABS tablet    lisinopril (PRINIVIL,ZESTRIL) 20 MG tablet    metFORMIN (GLUMETZA) 1000 MG (MOD) 24 hr tablet    PE (physical exam), annual        Relevant Orders    Comprehensive metabolic panel    Lipid panel    CBC with Differential/Platelet    TSH    Urinalysis, Routine w reflex microscopic (not at [facility])    PSA        Follow up plan: Return in about 3 months (around 12/02/2015) for a1c.

## 2015-09-01 NOTE — Assessment & Plan Note (Signed)
The current medical regimen is effective;  continue present plan and medications.  

## 2015-09-01 NOTE — Assessment & Plan Note (Signed)
Discussed poor control and to diet and exercise

## 2015-09-02 LAB — CBC WITH DIFFERENTIAL/PLATELET
Basophils Absolute: 0.1 10*3/uL (ref 0.0–0.2)
Basos: 1 %
EOS (ABSOLUTE): 0.1 10*3/uL (ref 0.0–0.4)
Eos: 1 %
Hematocrit: 53.5 % — ABNORMAL HIGH (ref 37.5–51.0)
Hemoglobin: 18.3 g/dL — ABNORMAL HIGH (ref 12.6–17.7)
IMMATURE GRANULOCYTES: 1 %
Immature Grans (Abs): 0 10*3/uL (ref 0.0–0.1)
Lymphocytes Absolute: 1.3 10*3/uL (ref 0.7–3.1)
Lymphs: 17 %
MCH: 29.9 pg (ref 26.6–33.0)
MCHC: 34.2 g/dL (ref 31.5–35.7)
MCV: 87 fL (ref 79–97)
MONOCYTES: 7 %
Monocytes Absolute: 0.6 10*3/uL (ref 0.1–0.9)
Neutrophils Absolute: 5.7 10*3/uL (ref 1.4–7.0)
Neutrophils: 73 %
Platelets: 216 10*3/uL (ref 150–379)
RBC: 6.13 x10E6/uL — AB (ref 4.14–5.80)
RDW: 13.5 % (ref 12.3–15.4)
WBC: 7.7 10*3/uL (ref 3.4–10.8)

## 2015-09-02 LAB — COMPREHENSIVE METABOLIC PANEL
A/G RATIO: 2.3 (ref 1.1–2.5)
ALK PHOS: 87 IU/L (ref 39–117)
ALT: 14 IU/L (ref 0–44)
AST: 15 IU/L (ref 0–40)
Albumin: 4.6 g/dL (ref 3.6–4.8)
BILIRUBIN TOTAL: 0.7 mg/dL (ref 0.0–1.2)
BUN/Creatinine Ratio: 20 (ref 10–22)
BUN: 29 mg/dL — ABNORMAL HIGH (ref 8–27)
CHLORIDE: 98 mmol/L (ref 97–106)
CO2: 18 mmol/L (ref 18–29)
Calcium: 9.7 mg/dL (ref 8.6–10.2)
Creatinine, Ser: 1.43 mg/dL — ABNORMAL HIGH (ref 0.76–1.27)
GFR calc non Af Amer: 51 mL/min/{1.73_m2} — ABNORMAL LOW (ref >59)
GFR, EST AFRICAN AMERICAN: 59 mL/min/{1.73_m2} — AB (ref >59)
Globulin, Total: 2 g/dL (ref 1.5–4.5)
Glucose: 194 mg/dL — ABNORMAL HIGH (ref 65–99)
POTASSIUM: 5.4 mmol/L — AB (ref 3.5–5.2)
Sodium: 138 mmol/L (ref 136–144)
TOTAL PROTEIN: 6.6 g/dL (ref 6.0–8.5)

## 2015-09-02 LAB — LIPID PANEL
CHOLESTEROL TOTAL: 138 mg/dL (ref 100–199)
Chol/HDL Ratio: 4.2 ratio units (ref 0.0–5.0)
HDL: 33 mg/dL — AB (ref >39)
LDL Calculated: 75 mg/dL (ref 0–99)
Triglycerides: 149 mg/dL (ref 0–149)
VLDL Cholesterol Cal: 30 mg/dL (ref 5–40)

## 2015-09-02 LAB — PSA: PROSTATE SPECIFIC AG, SERUM: 1.4 ng/mL (ref 0.0–4.0)

## 2015-09-02 LAB — TSH: TSH: 3.6 u[IU]/mL (ref 0.450–4.500)

## 2015-09-03 ENCOUNTER — Other Ambulatory Visit: Payer: Self-pay | Admitting: Family Medicine

## 2015-09-03 ENCOUNTER — Telehealth: Payer: Self-pay | Admitting: Family Medicine

## 2015-09-03 DIAGNOSIS — E1165 Type 2 diabetes mellitus with hyperglycemia: Secondary | ICD-10-CM

## 2015-09-03 MED ORDER — METFORMIN HCL 1000 MG PO TABS: 1000.0000 mg | ORAL_TABLET | Freq: Two times a day (BID) | ORAL | Status: DC

## 2015-09-03 NOTE — Progress Notes (Signed)
Pharmacy notified.

## 2015-09-03 NOTE — Telephone Encounter (Signed)
In call box for lab result

## 2015-09-03 NOTE — Telephone Encounter (Signed)
Pt missed a call and thought it may be about his blood work and would like a call back.

## 2015-09-04 ENCOUNTER — Telehealth: Payer: Self-pay | Admitting: Family Medicine

## 2015-09-04 DIAGNOSIS — D582 Other hemoglobinopathies: Secondary | ICD-10-CM

## 2015-09-04 DIAGNOSIS — E875 Hyperkalemia: Secondary | ICD-10-CM

## 2015-09-04 NOTE — Telephone Encounter (Signed)
-----   Message from Lurlean HornsNancy H Wilson, CMA sent at 09/04/2015  5:24 PM EDT ----- labs

## 2015-09-04 NOTE — Telephone Encounter (Signed)
Phone call Discussed with patient elevated hemoglobin Patient nonsmoker Not exposed to poor exhaust systems in vehicles trucks etc. Will recheck CBC and BMP for elevated potassium next week

## 2015-10-27 ENCOUNTER — Ambulatory Visit: Payer: Medicare Other | Admitting: Cardiovascular Disease

## 2015-11-11 ENCOUNTER — Telehealth: Payer: Self-pay | Admitting: Family Medicine

## 2015-11-11 MED ORDER — AMOXICILLIN 875 MG PO TABS: 875.0000 mg | ORAL_TABLET | Freq: Two times a day (BID) | ORAL | Status: DC

## 2015-11-11 NOTE — Telephone Encounter (Signed)
Pt requesting antibiotics and cough syrup, per pt he has had a sinus infection for 2 weeks.  [facility] [address]  No appts available this week.

## 2015-12-15 ENCOUNTER — Ambulatory Visit: Payer: BLUE CROSS/BLUE SHIELD | Admitting: Family Medicine

## 2015-12-22 ENCOUNTER — Ambulatory Visit (INDEPENDENT_AMBULATORY_CARE_PROVIDER_SITE_OTHER): Payer: BLUE CROSS/BLUE SHIELD | Admitting: Cardiovascular Disease

## 2016-01-06 ENCOUNTER — Encounter: Payer: Self-pay | Admitting: Family Medicine

## 2016-01-06 ENCOUNTER — Ambulatory Visit (INDEPENDENT_AMBULATORY_CARE_PROVIDER_SITE_OTHER): Payer: BLUE CROSS/BLUE SHIELD | Admitting: Family Medicine

## 2016-01-06 VITALS — BP 119/80 | HR 102 | Temp 98.3°F | Ht 69.3 in | Wt 274.0 lb

## 2016-01-06 DIAGNOSIS — I10 Essential (primary) hypertension: Secondary | ICD-10-CM | POA: Diagnosis not present

## 2016-01-06 DIAGNOSIS — J329 Chronic sinusitis, unspecified: Secondary | ICD-10-CM | POA: Diagnosis not present

## 2016-01-06 MED ORDER — HYDROCOD POLST-CPM POLST ER 10-8 MG/5ML PO SUER: 2.5000 mL | Freq: Two times a day (BID) | ORAL | Status: DC | PRN

## 2016-01-06 MED ORDER — AMOXICILLIN-POT CLAVULANATE 875-125 MG PO TABS: 1.0000 | ORAL_TABLET | Freq: Two times a day (BID) | ORAL | Status: DC

## 2016-01-06 NOTE — Progress Notes (Signed)
BP 119/80 mmHg  Pulse 102  Temp(Src) 98.3 F (36.8 C)  Ht 5' 9.3" (1.76 m)  Wt 274 lb (124.286 kg)  BMI 40.12 kg/m2  SpO2 99%   Subjective:    Patient ID: Jeff [name], male    DOB: [birth year], 65 y.o.   MRN: [id number]  HPI: Jeff [name] is a 65 y.o. male  Chief Complaint  Patient presents with  . URI   patient with complaints of yearly sinus infection comes on every spring has responded well to Amoxil in the past but seemed to take longer than usual last year. Patient took a Sudafed containing cold medicine prior to coming to the office with a rapid pulse consequently Has had cold drainage congestion feeling bad has not taken other allergy type medications Blood pressures doing well   Relevant past medical, surgical, family and social history reviewed and updated as indicated. Interim medical history since our last visit reviewed. Allergies and medications reviewed and updated.  Review of Systems  Constitutional: Positive for fever and fatigue.  HENT: Positive for congestion, rhinorrhea, sinus pressure, sneezing and sore throat.   Respiratory: Positive for cough. Negative for wheezing.   Cardiovascular: Negative for chest pain, palpitations and leg swelling.    Per HPI unless specifically indicated above     Objective:    BP 119/80 mmHg  Pulse 102  Temp(Src) 98.3 F (36.8 C)  Ht 5' 9.3" (1.76 m)  Wt 274 lb (124.286 kg)  BMI 40.12 kg/m2  SpO2 99%  Wt Readings from Last 3 Encounters:  01/06/16 274 lb (124.286 kg)  09/01/15 269 lb (122.018 kg)  05/22/15 274 lb (124.286 kg)    Physical Exam  Constitutional: He is oriented to person, place, and time. He appears well-developed and well-nourished. No distress.  HENT:  Head: Normocephalic and atraumatic.  Right Ear: Hearing and external ear normal.  Left Ear: Hearing and external ear normal.  Nose: Nose normal.  Mouth/Throat: Oropharyngeal exudate present.  Eyes: Conjunctivae, EOM and lids are normal.  Pupils are equal, round, and reactive to light. Right eye exhibits no discharge. Left eye exhibits no discharge. No scleral icterus.  Neck: Normal range of motion. No thyromegaly present.  Cardiovascular: Normal rate, regular rhythm and normal heart sounds.   Pulmonary/Chest: Effort normal and breath sounds normal. No respiratory distress.  Musculoskeletal: Normal range of motion.  Lymphadenopathy:    He has no cervical adenopathy.  Neurological: He is alert and oriented to person, place, and time.  Skin: Skin is intact. No rash noted.  Psychiatric: He has a normal mood and affect. His speech is normal and behavior is normal. Judgment and thought content normal. Cognition and memory are normal.    Results for orders placed or performed in visit on 09/01/15  Microscopic Examination  Result Value Ref Range   WBC, UA None seen 0 -  5 /hpf   RBC, UA None seen 0 -  2 /hpf   Epithelial Cells (non renal) None seen 0 - 10 /hpf  Lipid Panel Piccolo, Waived  Result Value Ref Range   Cholesterol Piccolo, Waived 134 <200 mg/dL   HDL Chol Piccolo, Waived 30 (L) >59 mg/dL   Triglycerides Piccolo,Waived 151 (H) <150 mg/dL   Chol/HDL Ratio Piccolo,Waive 4.5 mg/dL   LDL Chol Calc Piccolo Waived 74 <100 mg/dL   VLDL Chol Calc Piccolo,Waive 30 (H) <30 mg/dL  Bayer DCA Hb W0J Waived  Result Value Ref Range   Bayer DCA Hb A1c Waived  7.6 (H) <7.0 %  Comprehensive metabolic panel  Result Value Ref Range   Glucose 194 (H) 65 - 99 mg/dL   BUN 29 (H) 8 - 27 mg/dL   Creatinine, Ser 1.61 (H) 0.76 - 1.27 mg/dL   GFR calc non Af Amer 51 (L) >59 mL/min/1.73   GFR calc Af Amer 59 (L) >59 mL/min/1.73   BUN/Creatinine Ratio 20 10 - 22   Sodium 138 136 - 144 mmol/L   Potassium 5.4 (H) 3.5 - 5.2 mmol/L   Chloride 98 97 - 106 mmol/L   CO2 18 18 - 29 mmol/L   Calcium 9.7 8.6 - 10.2 mg/dL   Total Protein 6.6 6.0 - 8.5 g/dL   Albumin 4.6 3.6 - 4.8 g/dL   Globulin, Total 2.0 1.5 - 4.5 g/dL   Albumin/Globulin  Ratio 2.3 1.1 - 2.5   Bilirubin Total 0.7 0.0 - 1.2 mg/dL   Alkaline Phosphatase 87 39 - 117 IU/L   AST 15 0 - 40 IU/L   ALT 14 0 - 44 IU/L  Lipid panel  Result Value Ref Range   Cholesterol, Total 138 100 - 199 mg/dL   Triglycerides 096 0 - 149 mg/dL   HDL 33 (L) >04 mg/dL   VLDL Cholesterol Cal 30 5 - 40 mg/dL   LDL Calculated 75 0 - 99 mg/dL   Chol/HDL Ratio 4.2 0.0 - 5.0 ratio units  CBC with Differential/Platelet  Result Value Ref Range   WBC 7.7 3.4 - 10.8 x10E3/uL   RBC 6.13 (H) 4.14 - 5.80 x10E6/uL   Hemoglobin 18.3 (H) 12.6 - 17.7 g/dL   Hematocrit 54.0 (H) 98.1 - 51.0 %   MCV 87 79 - 97 fL   MCH 29.9 26.6 - 33.0 pg   MCHC 34.2 31.5 - 35.7 g/dL   RDW 19.1 47.8 - 29.5 %   Platelets 216 150 - 379 x10E3/uL   Neutrophils 73 %   Lymphs 17 %   Monocytes 7 %   Eos 1 %   Basos 1 %   Neutrophils Absolute 5.7 1.4 - 7.0 x10E3/uL   Lymphocytes Absolute 1.3 0.7 - 3.1 x10E3/uL   Monocytes Absolute 0.6 0.1 - 0.9 x10E3/uL   EOS (ABSOLUTE) 0.1 0.0 - 0.4 x10E3/uL   Basophils Absolute 0.1 0.0 - 0.2 x10E3/uL   Immature Granulocytes 1 %   Immature Grans (Abs) 0.0 0.0 - 0.1 x10E3/uL  TSH  Result Value Ref Range   TSH 3.600 0.450 - 4.500 uIU/mL  Urinalysis, Routine w reflex microscopic (not at [facility])  Result Value Ref Range   Specific Gravity, UA 1.010 1.005 - 1.030   pH, UA 5.0 5.0 - 7.5   Color, UA Yellow Yellow   Appearance Ur Clear Clear   Leukocytes, UA Negative Negative   Protein, UA Negative Negative/Trace   Glucose, UA 3+ (A) Negative   Ketones, UA Trace (A) Negative   RBC, UA Negative Negative   Bilirubin, UA Negative Negative   Urobilinogen, Ur 0.2 0.2 - 1.0 mg/dL   Nitrite, UA Negative Negative   Microscopic Examination See below:   PSA  Result Value Ref Range   Prostate Specific Ag, Serum 1.4 0.0 - 4.0 ng/mL      Assessment & Plan:   Problem List Items Addressed This Visit      Cardiovascular and Mediastinum   HYPERTENSION, BENIGN    The current medical  regimen is effective;  continue present plan and medications.        Other Visit  Diagnoses    Sinusitis, unspecified chronicity, unspecified location    -  Primary     sinusitis care and treatment use of medications over-the-counter     Relevant Medications    amoxicillin-clavulanate (AUGMENTIN) 875-125 MG tablet    chlorpheniramine-HYDROcodone (TUSSIONEX PENNKINETIC ER) 10-8 MG/5ML SUER        Follow up plan: Return if symptoms worsen or fail to improve, for Has regular appointment.

## 2016-01-06 NOTE — Assessment & Plan Note (Signed)
The current medical regimen is effective;  continue present plan and medications.  

## 2016-01-19 ENCOUNTER — Encounter: Payer: Self-pay | Admitting: Family Medicine

## 2016-01-19 ENCOUNTER — Ambulatory Visit (INDEPENDENT_AMBULATORY_CARE_PROVIDER_SITE_OTHER): Payer: PPO | Admitting: Family Medicine

## 2016-01-19 VITALS — BP 114/75 | HR 61 | Temp 97.8°F | Ht 69.3 in | Wt 268.0 lb

## 2016-01-19 DIAGNOSIS — J0191 Acute recurrent sinusitis, unspecified: Secondary | ICD-10-CM | POA: Diagnosis not present

## 2016-01-19 DIAGNOSIS — D582 Other hemoglobinopathies: Secondary | ICD-10-CM

## 2016-01-19 DIAGNOSIS — I10 Essential (primary) hypertension: Secondary | ICD-10-CM | POA: Diagnosis not present

## 2016-01-19 DIAGNOSIS — E1159 Type 2 diabetes mellitus with other circulatory complications: Secondary | ICD-10-CM

## 2016-01-19 DIAGNOSIS — I482 Chronic atrial fibrillation, unspecified: Secondary | ICD-10-CM

## 2016-01-19 DIAGNOSIS — E875 Hyperkalemia: Secondary | ICD-10-CM | POA: Diagnosis not present

## 2016-01-19 DIAGNOSIS — E785 Hyperlipidemia, unspecified: Secondary | ICD-10-CM

## 2016-01-19 LAB — LP+ALT+AST PICCOLO, WAIVED
ALT (SGPT) PICCOLO, WAIVED: 27 U/L (ref 10–47)
AST (SGOT) PICCOLO, WAIVED: 34 U/L (ref 11–38)
Chol/HDL Ratio Piccolo,Waive: 4 mg/dL
Cholesterol Piccolo, Waived: 106 mg/dL (ref <200)
HDL Chol Piccolo, Waived: 27 mg/dL — ABNORMAL LOW (ref >59)
LDL Chol Calc Piccolo Waived: 56 mg/dL (ref <100)
Triglycerides Piccolo,Waived: 117 mg/dL (ref <150)
VLDL Chol Calc Piccolo,Waive: 23 mg/dL (ref <30)

## 2016-01-19 LAB — BAYER DCA HB A1C WAIVED: HB A1C: 7.7 % — AB (ref <7.0)

## 2016-01-19 MED ORDER — FLUTICASONE PROPIONATE 50 MCG/ACT NA SUSP: 2.0000 | Freq: Every day | NASAL | Status: DC

## 2016-01-19 MED ORDER — SITAGLIPTIN PHOSPHATE 100 MG PO TABS: 100.0000 mg | ORAL_TABLET | Freq: Every day | ORAL | Status: DC

## 2016-01-19 MED ORDER — AZITHROMYCIN 250 MG PO TABS: ORAL_TABLET | ORAL | Status: DC

## 2016-01-19 MED ORDER — HYDROCOD POLST-CPM POLST ER 10-8 MG/5ML PO SUER: 2.5000 mL | Freq: Two times a day (BID) | ORAL | Status: DC | PRN

## 2016-01-19 NOTE — Assessment & Plan Note (Signed)
The current medical regimen is effective;  continue present plan and medications.  

## 2016-01-19 NOTE — Assessment & Plan Note (Signed)
Followed by cardiology and doing well

## 2016-01-19 NOTE — Progress Notes (Signed)
BP 114/75 mmHg  Pulse 61  Temp(Src) 97.8 F (36.6 C)  Ht 5' 9.3" (1.76 m)  Wt 268 lb (121.564 kg)  BMI 39.24 kg/m2  SpO2 99%   Subjective:    Patient ID: Jeff [name], male    DOB: 1951-09-14, 65 y.o.   MRN: [id number]  HPI: Jeff [name] is a 65 y.o. male  Chief Complaint  Patient presents with  . Diabetes  . Hyperlipidemia  . recheck bloodwork  patient with complaints of yearly sinus infection comes on every spring has responded well to Amoxil in the past but seemed to take longer than usual last year. Patient took Augmentin without problems and doesn't seem to have gone away Has used a saline spray and some over-the-counter sinus medicines without much relief Has had cold drainage congestion feeling bad has not taken other allergy type medications Blood pressures doing well   Relevant past medical, surgical, family and social history reviewed and updated as indicated. Interim medical history since our last visit reviewed. Allergies and medications reviewed and updated.  Review of Systems  Constitutional: Positive for fever and fatigue.  HENT: Positive for congestion, rhinorrhea, sinus pressure, sneezing and sore throat.  Respiratory: Positive for cough. Negative for wheezing.  Cardiovascular: Negative for chest pain, palpitations and leg swelling.    Relevant past medical, surgical, family and social history reviewed and updated as indicated. Interim medical history since our last visit reviewed. Allergies and medications reviewed and updated.  Review of Systems  Per HPI unless specifically indicated above     Objective:    BP 114/75 mmHg  Pulse 61  Temp(Src) 97.8 F (36.6 C)  Ht 5' 9.3" (1.76 m)  Wt 268 lb (121.564 kg)  BMI 39.24 kg/m2  SpO2 99%  Wt Readings from Last 3 Encounters:  01/19/16 268 lb (121.564 kg)  01/06/16 274 lb (124.286 kg)  09/01/15 269 lb (122.018 kg)    Physical Exam  Constitutional: He is oriented to person, place, and  time. He appears well-developed and well-nourished. No distress.  HENT:  Head: Normocephalic and atraumatic.  Right Ear: Hearing normal.  Left Ear: Hearing normal.  Nose: Nose normal.  Eyes: Conjunctivae and lids are normal. Right eye exhibits no discharge. Left eye exhibits no discharge. No scleral icterus.  Cardiovascular: Normal rate, regular rhythm and normal heart sounds.   Pulmonary/Chest: Effort normal and breath sounds normal. No respiratory distress.  Musculoskeletal: Normal range of motion.  Neurological: He is alert and oriented to person, place, and time.  Skin: Skin is intact. No rash noted.  Psychiatric: He has a normal mood and affect. His speech is normal and behavior is normal. Judgment and thought content normal. Cognition and memory are normal.    Results for orders placed or performed in visit on 09/01/15  Microscopic Examination  Result Value Ref Range   WBC, UA None seen 0 -  5 /hpf   RBC, UA None seen 0 -  2 /hpf   Epithelial Cells (non renal) None seen 0 - 10 /hpf  Lipid Panel Piccolo, Waived  Result Value Ref Range   Cholesterol Piccolo, Waived 134 <200 mg/dL   HDL Chol Piccolo, Waived 30 (L) >59 mg/dL   Triglycerides Piccolo,Waived 151 (H) <150 mg/dL   Chol/HDL Ratio Piccolo,Waive 4.5 mg/dL   LDL Chol Calc Piccolo Waived 74 <100 mg/dL   VLDL Chol Calc Piccolo,Waive 30 (H) <30 mg/dL  Bayer DCA Hb N8GA1c Waived  Result Value Ref Range   [employer] Enterprises[employer]ayer  DCA Hb A1c Waived 7.6 (H) <7.0 %  Comprehensive metabolic panel  Result Value Ref Range   Glucose 194 (H) 65 - 99 mg/dL   BUN 29 (H) 8 - 27 mg/dL   Creatinine, Ser 1.61 (H) 0.76 - 1.27 mg/dL   GFR calc non Af Amer 51 (L) >59 mL/min/1.73   GFR calc Af Amer 59 (L) >59 mL/min/1.73   BUN/Creatinine Ratio 20 10 - 22   Sodium 138 136 - 144 mmol/L   Potassium 5.4 (H) 3.5 - 5.2 mmol/L   Chloride 98 97 - 106 mmol/L   CO2 18 18 - 29 mmol/L   Calcium 9.7 8.6 - 10.2 mg/dL   Total Protein 6.6 6.0 - 8.5 g/dL   Albumin 4.6 3.6  - 4.8 g/dL   Globulin, Total 2.0 1.5 - 4.5 g/dL   Albumin/Globulin Ratio 2.3 1.1 - 2.5   Bilirubin Total 0.7 0.0 - 1.2 mg/dL   Alkaline Phosphatase 87 39 - 117 IU/L   AST 15 0 - 40 IU/L   ALT 14 0 - 44 IU/L  Lipid panel  Result Value Ref Range   Cholesterol, Total 138 100 - 199 mg/dL   Triglycerides 096 0 - 149 mg/dL   HDL 33 (L) >04 mg/dL   VLDL Cholesterol Cal 30 5 - 40 mg/dL   LDL Calculated 75 0 - 99 mg/dL   Chol/HDL Ratio 4.2 0.0 - 5.0 ratio units  CBC with Differential/Platelet  Result Value Ref Range   WBC 7.7 3.4 - 10.8 x10E3/uL   RBC 6.13 (H) 4.14 - 5.80 x10E6/uL   Hemoglobin 18.3 (H) 12.6 - 17.7 g/dL   Hematocrit 54.0 (H) 98.1 - 51.0 %   MCV 87 79 - 97 fL   MCH 29.9 26.6 - 33.0 pg   MCHC 34.2 31.5 - 35.7 g/dL   RDW 19.1 47.8 - 29.5 %   Platelets 216 150 - 379 x10E3/uL   Neutrophils 73 %   Lymphs 17 %   Monocytes 7 %   Eos 1 %   Basos 1 %   Neutrophils Absolute 5.7 1.4 - 7.0 x10E3/uL   Lymphocytes Absolute 1.3 0.7 - 3.1 x10E3/uL   Monocytes Absolute 0.6 0.1 - 0.9 x10E3/uL   EOS (ABSOLUTE) 0.1 0.0 - 0.4 x10E3/uL   Basophils Absolute 0.1 0.0 - 0.2 x10E3/uL   Immature Granulocytes 1 %   Immature Grans (Abs) 0.0 0.0 - 0.1 x10E3/uL  TSH  Result Value Ref Range   TSH 3.600 0.450 - 4.500 uIU/mL  Urinalysis, Routine w reflex microscopic (not at [facility])  Result Value Ref Range   Specific Gravity, UA 1.010 1.005 - 1.030   pH, UA 5.0 5.0 - 7.5   Color, UA Yellow Yellow   Appearance Ur Clear Clear   Leukocytes, UA Negative Negative   Protein, UA Negative Negative/Trace   Glucose, UA 3+ (A) Negative   Ketones, UA Trace (A) Negative   RBC, UA Negative Negative   Bilirubin, UA Negative Negative   Urobilinogen, Ur 0.2 0.2 - 1.0 mg/dL   Nitrite, UA Negative Negative   Microscopic Examination See below:   PSA  Result Value Ref Range   Prostate Specific Ag, Serum 1.4 0.0 - 4.0 ng/mL      Assessment & Plan:   Problem List Items Addressed This Visit       Cardiovascular and Mediastinum   HYPERTENSION, BENIGN    The current medical regimen is effective;  continue present plan and medications.  ATRIAL FIBRILLATION    Followed by cardiology and doing well        Endocrine   Diabetes (HCC) - Primary    Discuss ongoing elevated A1c will add Januvia to patient's medications      Relevant Medications   sitaGLIPtin (JANUVIA) 100 MG tablet   Other Relevant Orders   LP+ALT+AST Piccolo, Waived   Bayer DCA Hb A1c Waived     Other   Hyperlipidemia    The current medical regimen is effective;  continue present plan and medications.       Relevant Orders   LP+ALT+AST Piccolo, Waived   Bayer DCA Hb A1c Waived    Other Visit Diagnoses    Elevated hemoglobin (HCC)        Hyperkalemia        Acute recurrent sinusitis, unspecified location        Discussed sinusitis care and treatment use azithromycin discuss if not better to ear nose and throat discuss over-the-counter nasal rinse and cough treatment    Relevant Medications    chlorpheniramine-HYDROcodone (TUSSIONEX PENNKINETIC ER) 10-8 MG/5ML SUER    azithromycin (ZITHROMAX) 250 MG tablet    fluticasone (FLONASE) 50 MCG/ACT nasal spray        Follow up plan: Return in about 3 months (around 04/20/2016), or if symptoms worsen or fail to improve, for BMP, lipids, alt, ast, a1c.

## 2016-01-19 NOTE — Assessment & Plan Note (Signed)
Discuss ongoing elevated A1c will add Januvia to patient's medications

## 2016-01-20 ENCOUNTER — Encounter: Payer: Self-pay | Admitting: Family Medicine

## 2016-01-20 LAB — CBC WITH DIFFERENTIAL/PLATELET
Basophils Absolute: 0 10*3/uL (ref 0.0–0.2)
Basos: 1 %
EOS (ABSOLUTE): 0.1 10*3/uL (ref 0.0–0.4)
EOS: 1 %
HEMATOCRIT: 50 % (ref 37.5–51.0)
Hemoglobin: 17.2 g/dL (ref 12.6–17.7)
Immature Grans (Abs): 0.1 10*3/uL (ref 0.0–0.1)
Immature Granulocytes: 2 %
LYMPHS ABS: 1.7 10*3/uL (ref 0.7–3.1)
Lymphs: 21 %
MCH: 29.9 pg (ref 26.6–33.0)
MCHC: 34.4 g/dL (ref 31.5–35.7)
MCV: 87 fL (ref 79–97)
MONOS ABS: 0.8 10*3/uL (ref 0.1–0.9)
Monocytes: 10 %
Neutrophils Absolute: 5.3 10*3/uL (ref 1.4–7.0)
Neutrophils: 65 %
PLATELETS: 225 10*3/uL (ref 150–379)
RBC: 5.76 x10E6/uL (ref 4.14–5.80)
RDW: 13.2 % (ref 12.3–15.4)
WBC: 8.1 10*3/uL (ref 3.4–10.8)

## 2016-01-20 LAB — BASIC METABOLIC PANEL
BUN / CREAT RATIO: 15 (ref 10–22)
BUN: 19 mg/dL (ref 8–27)
CO2: 22 mmol/L (ref 18–29)
Calcium: 9.3 mg/dL (ref 8.6–10.2)
Chloride: 96 mmol/L (ref 96–106)
Creatinine, Ser: 1.27 mg/dL (ref 0.76–1.27)
GFR, EST AFRICAN AMERICAN: 69 mL/min/{1.73_m2} (ref >59)
GFR, EST NON AFRICAN AMERICAN: 59 mL/min/{1.73_m2} — AB (ref >59)
Glucose: 158 mg/dL — ABNORMAL HIGH (ref 65–99)
Potassium: 4.4 mmol/L (ref 3.5–5.2)
SODIUM: 138 mmol/L (ref 134–144)

## 2016-01-26 ENCOUNTER — Encounter: Payer: Self-pay | Admitting: Cardiovascular Disease

## 2016-01-26 ENCOUNTER — Ambulatory Visit (INDEPENDENT_AMBULATORY_CARE_PROVIDER_SITE_OTHER): Payer: PPO | Admitting: Cardiovascular Disease

## 2016-01-26 VITALS — BP 130/90 | HR 77 | Ht 69.3 in | Wt 272.0 lb

## 2016-01-26 DIAGNOSIS — I481 Persistent atrial fibrillation: Secondary | ICD-10-CM

## 2016-01-26 DIAGNOSIS — E1159 Type 2 diabetes mellitus with other circulatory complications: Secondary | ICD-10-CM | POA: Diagnosis not present

## 2016-01-26 DIAGNOSIS — E785 Hyperlipidemia, unspecified: Secondary | ICD-10-CM | POA: Diagnosis not present

## 2016-01-26 DIAGNOSIS — I10 Essential (primary) hypertension: Secondary | ICD-10-CM | POA: Diagnosis not present

## 2016-01-26 DIAGNOSIS — I4819 Other persistent atrial fibrillation: Secondary | ICD-10-CM

## 2016-01-26 NOTE — Patient Instructions (Signed)
You are doing well. No medication changes were made.  Please call us if you have new issues that need to be addressed before your next appt.  Your physician wants you to follow-up in: 12 months.  You will receive a reminder letter in the mail two months in advance. If you don't receive a letter, please call our office to schedule the follow-up appointment. 

## 2016-01-26 NOTE — Assessment & Plan Note (Signed)
Heart rate well controlled, tolerating anticoagulation No changes to his medications 

## 2016-01-26 NOTE — Assessment & Plan Note (Signed)
Blood pressure is well controlled on today's visit. No changes made to the medications. 

## 2016-01-26 NOTE — Assessment & Plan Note (Signed)
We have encouraged continued exercise, careful diet management in an effort to lose weight. 

## 2016-01-26 NOTE — Progress Notes (Signed)
Patient ID: Jeff [name], male    DOB: 1951/02/13, 65 y.o.   MRN: [id number]  HPI Comments: Jeff [name] is a 65 y/o male with h/o HTN, DM2 and hyperlipidemia with admission to [facility] in April for atrial fibrillation with RVR, repeat visit to the emergency room on April 15 2010  for rapid atrial fibrillation, where he underwent s/p cardioversion at that time he was having  lower extremity edema On calcium channel blockers,  today he presents for routine followup for atrial fibrillation. Converted back to atrial fibrillation in 2016, at that time did not want cardioversion  In follow-up today, reports he feels well. Does have occasional shortness of breath on exertion but is still dancing on the weekends, active Continues to work, delivers auto parts.  Denies any leg edema Weight is stable. Review of lab work shows hemoglobin A1c 7.7 Lipid panel within goal He is tolerating anticoagulation, xarelto. Medication is expensive  EKG shows atrial fibrillation with ventricular rate in the 79, no significant ST or T-wave changes  Prior lab work Notes indicate hemoglobin A1c 8.1, creatinine 1.51, BUN 24, potassium 4.4, GFR 48 He continues to work part-time at [employer] in 02/2010 shows EF 40-45% which was felt to be tachycardia induced.  Prior lab work LDL 110, hemoglobin A1c 9.1, HDL 36, total cholesterol 409     No Known Allergies  Outpatient Encounter Prescriptions as of 01/26/2016  Medication Sig  . amLODipine (NORVASC) 10 MG tablet Take 1 tablet (10 mg total) by mouth daily.  Marland Kitchen aspirin EC 81 MG tablet Take 1 tablet (81 mg total) by mouth daily.  Marland Kitchen atorvastatin (LIPITOR) 10 MG tablet Take 1 tablet (10 mg total) by mouth daily.  . canagliflozin (INVOKANA) 100 MG TABS tablet Take 1 tablet (100 mg total) by mouth daily.  . chlorpheniramine-HYDROcodone (TUSSIONEX PENNKINETIC ER) 10-8 MG/5ML SUER Take 2.5-5 mLs by mouth every 12 (twelve) hours as needed for cough.  .  fluticasone (FLONASE) 50 MCG/ACT nasal spray Place 2 sprays into both nostrils daily.  Marland Kitchen lisinopril (PRINIVIL,ZESTRIL) 20 MG tablet Take 1 tablet (20 mg total) by mouth daily.  . metFORMIN (GLUCOPHAGE) 1000 MG tablet Take 1 tablet (1,000 mg total) by mouth 2 (two) times daily with a meal.  . metoprolol (LOPRESSOR) 50 MG tablet Take 1 tablet (50 mg total) by mouth 2 (two) times daily.  . rivaroxaban (XARELTO) 20 MG TABS tablet Take 1 tablet (20 mg total) by mouth daily with supper.  . sitaGLIPtin (JANUVIA) 100 MG tablet Take 1 tablet (100 mg total) by mouth daily.  . [DISCONTINUED] azithromycin (ZITHROMAX) 250 MG tablet 2 now then 1 a day (Patient not taking: Reported on 01/26/2016)   No facility-administered encounter medications on file as of 01/26/2016.    Past Medical History  Diagnosis Date  . Acute systolic heart failure (HCC)     with EF of 40-45% by echocardiogram, liekly tachycardia induced cariomyopathy  . HLD (hyperlipidemia)   . Chronic low back pain   . Hypogonadism in male   . Erectile dysfunction   . DVT of lower limb, acute [facility])     Past Surgical History  Procedure Laterality Date  . Dvt, leg Left     Social History  reports that he has never smoked. He has never used smokeless tobacco. He reports that he does not drink alcohol or use illicit drugs.  Family History family history includes Heart failure in his father. There is no history  of Heart attack or Hypertension.   Review of Systems  Constitutional: Negative.   Respiratory: Negative.   Cardiovascular: Negative.   Gastrointestinal: Negative.   Musculoskeletal: Negative.   Skin: Negative.   Neurological: Negative.   Psychiatric/Behavioral: Negative.   All other systems reviewed and are negative.   BP 130/90 mmHg  Pulse 77  Ht 5' 9.3" (1.76 m)  Wt 272 lb (123.378 kg)  BMI 39.83 kg/m2  Physical Exam  Constitutional: He is oriented to person, place, and time. He appears well-developed and  well-nourished.  obese  HENT:  Head: Normocephalic.  Nose: Nose normal.  Mouth/Throat: Oropharynx is clear and moist.  Eyes: Conjunctivae are normal. Pupils are equal, round, and reactive to light.  Neck: Normal range of motion. Neck supple. No JVD present.  Cardiovascular: Normal rate, S1 normal, S2 normal, normal heart sounds and intact distal pulses.  An irregularly irregular rhythm present. Exam reveals no gallop and no friction rub.   No murmur heard. Pulmonary/Chest: Effort normal and breath sounds normal. No respiratory distress. He has no wheezes. He has no rales. He exhibits no tenderness.  Abdominal: Soft. Bowel sounds are normal. He exhibits no distension. There is no tenderness.  Musculoskeletal: Normal range of motion. He exhibits no edema or tenderness.  Lymphadenopathy:    He has no cervical adenopathy.  Neurological: He is alert and oriented to person, place, and time. Coordination normal.  Skin: Skin is warm and dry. No rash noted. No erythema.  Psychiatric: He has a normal mood and affect. His behavior is normal. Judgment and thought content normal.      Assessment and Plan   Nursing note and vitals reviewed.

## 2016-01-26 NOTE — Assessment & Plan Note (Signed)
Cholesterol is at goal on the current lipid regimen. No changes to the medications were made.  

## 2016-02-03 ENCOUNTER — Encounter: Payer: Self-pay | Admitting: Family Medicine

## 2016-03-15 ENCOUNTER — Telehealth: Payer: Self-pay | Admitting: Family Medicine

## 2016-03-15 DIAGNOSIS — Z1211 Encounter for screening for malignant neoplasm of colon: Secondary | ICD-10-CM

## 2016-03-15 NOTE — Telephone Encounter (Signed)
Pt called would like to have a colonoscopy scheduled. Please call pt ASAP. Thanks.

## 2016-03-15 NOTE — Telephone Encounter (Signed)
Would like an appt early any Monday morning.

## 2016-03-16 NOTE — Telephone Encounter (Signed)
Will refer to Dr.Wohl

## 2016-03-29 ENCOUNTER — Ambulatory Visit: Payer: BLUE CROSS/BLUE SHIELD | Admitting: Family Medicine

## 2016-04-28 ENCOUNTER — Telehealth: Payer: Self-pay

## 2016-04-28 ENCOUNTER — Other Ambulatory Visit: Payer: Self-pay

## 2016-04-28 NOTE — Telephone Encounter (Signed)
Patient needs to change his colonoscopy date. Please call

## 2016-04-28 NOTE — Telephone Encounter (Signed)
Gastroenterology Pre-Procedure Review  Request Date: 05/21/16 Requesting Physician: Dr. Dossie Arbourrissman  PATIENT REVIEW QUESTIONS: The patient responded to the following health history questions as indicated:    1. Are you having any GI issues? No 2. Do you have a personal history of Polyps? no 3. Do you have a family history of Colon Cancer or Polyps? no 4. Diabetes Mellitus? yes (Type 2) 5. Joint replacements in the past 12 months?no 6. Major health problems in the past 3 months?no 7. Any artificial heart valves, MVP, or defibrillator?yes (AFib)    MEDICATIONS & ALLERGIES:    Patient reports the following regarding taking any anticoagulation/antiplatelet therapy:   Plavix, Coumadin, Eliquis, Xarelto, Lovenox, Pradaxa, Brilinta, or Effient? yes (Xarelto 20mg ) Aspirin? yes (ASA 81mg )  Patient confirms/reports the following medications:  Current Outpatient Prescriptions  Medication Sig Dispense Refill  . amLODipine (NORVASC) 10 MG tablet Take 1 tablet (10 mg total) by mouth daily. 90 tablet 4  . aspirin EC 81 MG tablet Take 1 tablet (81 mg total) by mouth daily. 90 tablet 3  . atorvastatin (LIPITOR) 10 MG tablet Take 1 tablet (10 mg total) by mouth daily. 90 tablet 4  . canagliflozin (INVOKANA) 100 MG TABS tablet Take 1 tablet (100 mg total) by mouth daily. 90 tablet 4  . chlorpheniramine-HYDROcodone (TUSSIONEX PENNKINETIC ER) 10-8 MG/5ML SUER Take 2.5-5 mLs by mouth every 12 (twelve) hours as needed for cough. 70 mL 0  . fluticasone (FLONASE) 50 MCG/ACT nasal spray Place 2 sprays into both nostrils daily. 16 g 6  . lisinopril (PRINIVIL,ZESTRIL) 20 MG tablet Take 1 tablet (20 mg total) by mouth daily. 90 tablet 4  . metFORMIN (GLUCOPHAGE) 1000 MG tablet Take 1 tablet (1,000 mg total) by mouth 2 (two) times daily with a meal. 180 tablet 4  . metoprolol (LOPRESSOR) 50 MG tablet Take 1 tablet (50 mg total) by mouth 2 (two) times daily. 180 tablet 4  . rivaroxaban (XARELTO) 20 MG TABS tablet Take  1 tablet (20 mg total) by mouth daily with supper. 90 tablet 4  . sitaGLIPtin (JANUVIA) 100 MG tablet Take 1 tablet (100 mg total) by mouth daily. 30 tablet 4   No current facility-administered medications for this visit.    Patient confirms/reports the following allergies:  No Known Allergies  No orders of the defined types were placed in this encounter.    AUTHORIZATION INFORMATION Primary Insurance: 1D#: Group #:  Secondary Insurance: 1D#: Group #:  SCHEDULE INFORMATION: Date:  Time: Location:

## 2016-04-29 NOTE — Telephone Encounter (Signed)
Please call patient. He has questions regarding Colonoscopy.

## 2016-04-29 NOTE — Telephone Encounter (Signed)
Pt rescheduled his colonoscopy to 05/28/16. Please precert for screening. Z12.11

## 2016-05-03 ENCOUNTER — Ambulatory Visit: Payer: BLUE CROSS/BLUE SHIELD | Admitting: Family Medicine

## 2016-05-19 ENCOUNTER — Ambulatory Visit: Payer: BLUE CROSS/BLUE SHIELD | Admitting: Family Medicine

## 2016-05-21 ENCOUNTER — Encounter: Payer: Self-pay | Admitting: *Deleted

## 2016-05-21 ENCOUNTER — Other Ambulatory Visit: Payer: Self-pay

## 2016-05-27 NOTE — Discharge Instructions (Signed)

## 2016-05-28 ENCOUNTER — Encounter: Payer: Self-pay | Admitting: *Deleted

## 2016-05-28 ENCOUNTER — Ambulatory Visit
Admission: RE | Admit: 2016-05-28 | Discharge: 2016-05-28 | Disposition: A | Discharge status: Home / Self Care | Payer: PPO | Source: Ambulatory Visit | Attending: Gastroenterology | Admitting: Gastroenterology

## 2016-05-28 ENCOUNTER — Encounter
Admission: RE | Disposition: A | Discharge status: Home / Self Care | Payer: Self-pay | Source: Ambulatory Visit | Attending: Gastroenterology

## 2016-05-28 ENCOUNTER — Ambulatory Visit: Payer: PPO | Admitting: Student in an Organized Health Care Education/Training Program

## 2016-05-28 DIAGNOSIS — Z7901 Long term (current) use of anticoagulants: Secondary | ICD-10-CM | POA: Diagnosis not present

## 2016-05-28 DIAGNOSIS — I5021 Acute systolic (congestive) heart failure: Secondary | ICD-10-CM | POA: Insufficient documentation

## 2016-05-28 DIAGNOSIS — G8929 Other chronic pain: Secondary | ICD-10-CM | POA: Diagnosis not present

## 2016-05-28 DIAGNOSIS — E785 Hyperlipidemia, unspecified: Secondary | ICD-10-CM | POA: Insufficient documentation

## 2016-05-28 DIAGNOSIS — Z7984 Long term (current) use of oral hypoglycemic drugs: Secondary | ICD-10-CM | POA: Insufficient documentation

## 2016-05-28 DIAGNOSIS — Z7982 Long term (current) use of aspirin: Secondary | ICD-10-CM | POA: Insufficient documentation

## 2016-05-28 DIAGNOSIS — I110 Hypertensive heart disease with heart failure: Secondary | ICD-10-CM | POA: Insufficient documentation

## 2016-05-28 DIAGNOSIS — I4891 Unspecified atrial fibrillation: Secondary | ICD-10-CM | POA: Insufficient documentation

## 2016-05-28 DIAGNOSIS — E119 Type 2 diabetes mellitus without complications: Secondary | ICD-10-CM | POA: Insufficient documentation

## 2016-05-28 DIAGNOSIS — Z833 Family history of diabetes mellitus: Secondary | ICD-10-CM | POA: Insufficient documentation

## 2016-05-28 DIAGNOSIS — M545 Low back pain: Secondary | ICD-10-CM | POA: Insufficient documentation

## 2016-05-28 DIAGNOSIS — I739 Peripheral vascular disease, unspecified: Secondary | ICD-10-CM | POA: Insufficient documentation

## 2016-05-28 DIAGNOSIS — Z79899 Other long term (current) drug therapy: Secondary | ICD-10-CM | POA: Diagnosis not present

## 2016-05-28 DIAGNOSIS — Z86718 Personal history of other venous thrombosis and embolism: Secondary | ICD-10-CM | POA: Insufficient documentation

## 2016-05-28 DIAGNOSIS — Z1211 Encounter for screening for malignant neoplasm of colon: Secondary | ICD-10-CM | POA: Insufficient documentation

## 2016-05-28 DIAGNOSIS — Z7189 Other specified counseling: Secondary | ICD-10-CM | POA: Insufficient documentation

## 2016-05-28 DIAGNOSIS — K640 First degree hemorrhoids: Secondary | ICD-10-CM | POA: Insufficient documentation

## 2016-05-28 HISTORY — DX: Essential (primary) hypertension: I10

## 2016-05-28 HISTORY — DX: Type 2 diabetes mellitus without complications: E11.9

## 2016-05-28 HISTORY — PX: COLONOSCOPY WITH PROPOFOL: SHX5780

## 2016-05-28 LAB — GLUCOSE, CAPILLARY
Glucose-Capillary: 124 mg/dL — ABNORMAL HIGH (ref 65–99)
Glucose-Capillary: 128 mg/dL — ABNORMAL HIGH (ref 65–99)

## 2016-05-28 SURGERY — COLONOSCOPY WITH PROPOFOL
Anesthesia: Monitor Anesthesia Care | Wound class: Contaminated

## 2016-05-28 MED ORDER — ONDANSETRON HCL 4 MG/2ML IJ SOLN: 4.0000 mg | Freq: Once | INTRAMUSCULAR | Status: DC | PRN

## 2016-05-28 MED ORDER — ACETAMINOPHEN 325 MG PO TABS: 325.0000 mg | ORAL_TABLET | ORAL | Status: DC | PRN

## 2016-05-28 MED ORDER — SIMETHICONE 40 MG/0.6ML PO SUSP
ORAL | Status: DC | PRN
  Administered 2016-05-28: 10:00:00

## 2016-05-28 MED ORDER — PROPOFOL 10 MG/ML IV BOLUS
INTRAVENOUS | Status: DC | PRN
  Administered 2016-05-28: 20 mg via INTRAVENOUS
  Administered 2016-05-28: 30 mg via INTRAVENOUS
  Administered 2016-05-28: 80 mg via INTRAVENOUS
  Administered 2016-05-28 (×2): 20 mg via INTRAVENOUS
  Administered 2016-05-28: 30 mg via INTRAVENOUS

## 2016-05-28 MED ORDER — LACTATED RINGERS IV SOLN
INTRAVENOUS | Status: DC
  Administered 2016-05-28: 08:00:00 via INTRAVENOUS

## 2016-05-28 MED ORDER — ACETAMINOPHEN 160 MG/5ML PO SOLN: 325.0000 mg | ORAL | Status: DC | PRN

## 2016-05-28 MED ORDER — LIDOCAINE HCL (CARDIAC) 20 MG/ML IV SOLN
INTRAVENOUS | Status: DC | PRN
  Administered 2016-05-28: 50 mg via INTRAVENOUS

## 2016-05-28 SURGICAL SUPPLY — 23 items
CANISTER SUCT 1200ML W/VALVE (MISCELLANEOUS) ×2 IMPLANT
CLIP HMST 235XBRD CATH ROT (MISCELLANEOUS) IMPLANT
CLIP RESOLUTION 360 11X235 (MISCELLANEOUS)
FCP ESCP3.2XJMB 240X2.8X (MISCELLANEOUS)
FORCEPS BIOP RAD 4 LRG CAP 4 (CUTTING FORCEPS) IMPLANT
FORCEPS BIOP RJ4 240 W/NDL (MISCELLANEOUS)
FORCEPS ESCP3.2XJMB 240X2.8X (MISCELLANEOUS) IMPLANT
GOWN CVR UNV OPN BCK APRN NK (MISCELLANEOUS) ×2 IMPLANT
GOWN ISOL THUMB LOOP REG UNIV (MISCELLANEOUS) ×2
INJECTOR VARIJECT VIN23 (MISCELLANEOUS) IMPLANT
KIT DEFENDO VALVE AND CONN (KITS) IMPLANT
KIT ENDO PROCEDURE OLY (KITS) ×2 IMPLANT
MARKER SPOT ENDO TATTOO 5ML (MISCELLANEOUS) IMPLANT
PAD GROUND ADULT SPLIT (MISCELLANEOUS) IMPLANT
PROBE APC STR FIRE (PROBE) IMPLANT
RETRIEVER NET ROTH 2.5X230 LF (MISCELLANEOUS) ×2 IMPLANT
SNARE SHORT THROW 13M SML OVAL (MISCELLANEOUS) IMPLANT
SNARE SHORT THROW 30M LRG OVAL (MISCELLANEOUS) IMPLANT
SNARE SNG USE RND 15MM (INSTRUMENTS) IMPLANT
SPOT EX ENDOSCOPIC TATTOO (MISCELLANEOUS)
TRAP ETRAP POLY (MISCELLANEOUS) IMPLANT
VARIJECT INJECTOR VIN23 (MISCELLANEOUS)
WATER STERILE IRR 250ML POUR (IV SOLUTION) ×2 IMPLANT

## 2016-05-28 NOTE — Anesthesia Postprocedure Evaluation (Signed)
Anesthesia Post Note  Patient: Jeff [name]  Procedure(s) Performed: Procedure(s) (LRB): COLONOSCOPY WITH PROPOFOL (N/A)  Patient location during evaluation: PACU Anesthesia Type: MAC Level of consciousness: awake and alert and oriented Pain management: pain level controlled Vital Signs Assessment: post-procedure vital signs reviewed and stable Respiratory status: spontaneous breathing and nonlabored ventilation Cardiovascular status: stable Postop Assessment: no signs of nausea or vomiting and adequate PO intake Anesthetic complications: no    Harolyn RutherfordJoshua Griselda Bramblett

## 2016-05-28 NOTE — Anesthesia Procedure Notes (Signed)
Procedure Name: MAC Performed by: Clarine Elrod Pre-anesthesia Checklist: Patient identified, Emergency Drugs available, Suction available, Timeout performed and Patient being monitored Patient Re-evaluated:Patient Re-evaluated prior to inductionOxygen Delivery Method: Nasal cannula Placement Confirmation: positive ETCO2       

## 2016-05-28 NOTE — Transfer of Care (Signed)
Immediate Anesthesia Transfer of Care Note  Patient: Jeff [name]  Procedure(s) Performed: Procedure(s) with comments: COLONOSCOPY WITH PROPOFOL (N/A) - diabetic - oral meds  Patient Location: PACU  Anesthesia Type: MAC  Level of Consciousness: awake, alert  and patient cooperative  Airway and Oxygen Therapy: Patient Spontanous Breathing and Patient connected to supplemental oxygen  Post-op Assessment: Post-op Vital signs reviewed, Patient's Cardiovascular Status Stable, Respiratory Function Stable, Patent Airway and No signs of Nausea or vomiting  Post-op Vital Signs: Reviewed and stable  Complications: No apparent anesthesia complications

## 2016-05-28 NOTE — Anesthesia Preprocedure Evaluation (Addendum)
Anesthesia Evaluation  Patient identified by MRN, date of birth, ID band Patient awake    Reviewed: Allergy & Precautions, NPO status , Patient's Chart, lab work & pertinent test results  Airway Mallampati: II  TM Distance: >3 FB Neck ROM: Full    Dental no notable dental hx.    Pulmonary neg pulmonary ROS,    Pulmonary exam normal        Cardiovascular hypertension, Pt. on medications and Pt. on home beta blockers + Peripheral Vascular Disease and +CHF  Normal cardiovascular exam+ dysrhythmias Atrial Fibrillation      Neuro/Psych negative neurological ROS  negative psych ROS   GI/Hepatic negative GI ROS, Neg liver ROS,   Endo/Other  diabetes, Type 2, Oral Hypoglycemic Agents  Renal/GU negative Renal ROS     Musculoskeletal Chronic low back pain   Abdominal   Peds  Hematology negative hematology ROS (+)   Anesthesia Other Findings   Reproductive/Obstetrics                            Anesthesia Physical Anesthesia Plan  ASA: III  Anesthesia Plan: MAC   Post-op Pain Management:    Induction: Intravenous  Airway Management Planned:   Additional Equipment:   Intra-op Plan:   Post-operative Plan:   Informed Consent: I have reviewed the patients History and Physical, chart, labs and discussed the procedure including the risks, benefits and alternatives for the proposed anesthesia with the patient or authorized representative who has indicated his/her understanding and acceptance.     Plan Discussed with: CRNA  Anesthesia Plan Comments:         Anesthesia Quick Evaluation

## 2016-05-28 NOTE — Op Note (Signed)
[facility] Gastroenterology Patient Name: Jeff [name] Procedure Date: 05/28/2016 9:43 AM MRN: [id number] Account #: [id number] Date of Birth: 07-27-1951 Admit Type: Outpatient Age: 65 Room: [facility] OR ROOM 01 Gender: Male Note Status: Finalized Procedure:            Colonoscopy Indications:          Screening for colorectal malignant neoplasm Providers:            Midge Minium MD, MD Referring MD:         Steele Sizer, MD (Referring MD) Medicines:            Propofol per Anesthesia Complications:        No immediate complications. Procedure:            Pre-Anesthesia Assessment:                       - Prior to the procedure, a History and Physical was                        performed, and patient medications and allergies were                        reviewed. The patient's tolerance of previous                        anesthesia was also reviewed. The risks and benefits of                        the procedure and the sedation options and risks were                        discussed with the patient. All questions were                        answered, and informed consent was obtained. Prior                        Anticoagulants: The patient has taken no previous                        anticoagulant or antiplatelet agents. ASA Grade                        Assessment: II - A patient with mild systemic disease.                        After reviewing the risks and benefits, the patient was                        deemed in satisfactory condition to undergo the                        procedure.                       After obtaining informed consent, the colonoscope was                        passed under direct vision. Throughout the procedure,  the patient's blood pressure, pulse, and oxygen                        saturations were monitored continuously. The Olympus                        CF-HQ190L Colonoscope (S#. [id number]) was introduced                through the anus and advanced to the the cecum,                        identified by appendiceal orifice and ileocecal valve.                        The colonoscopy was performed without difficulty. The                        patient tolerated the procedure well. The quality of                        the bowel preparation was excellent. Findings:      The perianal and digital rectal examinations were normal.      Non-bleeding internal hemorrhoids were found during retroflexion. The       hemorrhoids were Grade I (internal hemorrhoids that do not prolapse). Impression:           - Non-bleeding internal hemorrhoids.                       - No specimens collected. Recommendation:       - Repeat colonoscopy in 10 years for screening unless                        any change in family history or lower GI problems. Procedure Code(s):    --- Professional ---                       [id number], Colonoscopy, flexible; diagnostic, including                        collection of specimen(s) by brushing or washing, when                        performed (separate procedure) Diagnosis Code(s):    --- Professional ---                       Z12.11, Encounter for screening for malignant neoplasm                        of colon                       K64.0, First degree hemorrhoids CPT copyright 2016 [employer]. All rights reserved. The codes documented in this report are preliminary and upon coder review may  be revised to meet current compliance requirements. Midge Miniumarren Vanessia Bokhari MD, MD 05/28/2016 10:09:21 AM This report has been signed electronically. Number of Addenda: 0 Note Initiated On: 05/28/2016 9:43 AM Scope Withdrawal Time: 0 hours 7 minutes 29 seconds  Total Procedure Duration: 0 hours 11 minutes 11 seconds       [facility]

## 2016-05-28 NOTE — H&P (Signed)
Midge Minium, MD [facility] [address] [place], [place] [address] Phone: [phone] Fax : [phone]  Primary Care Physician:  Vonita Moss, MD Primary Gastroenterologist:  Dr. Servando Snare  Pre-Procedure History & Physical: HPI:  Jeff [name] is a 65 y.o. male is here for a screening colonoscopy.   Past Medical History  Diagnosis Date  . Acute systolic heart failure (HCC)     with EF of 40-45% by echocardiogram, liekly tachycardia induced cariomyopathy  . HLD (hyperlipidemia)   . Chronic low back pain   . Hypogonadism in male   . Erectile dysfunction   . Hypertension   . Atrial fibrillation (HCC)   . DVT of lower limb, acute (HCC) 2014  . Diabetes mellitus without complication [facility])     Past Surgical History  Procedure Laterality Date  . Dvt, leg Left   . Insertion of vena cava filter  2014    Prior to Admission medications   Medication Sig Start Date End Date Taking? Authorizing Provider  amLODipine (NORVASC) 10 MG tablet Take 1 tablet (10 mg total) by mouth daily. 09/01/15  Yes Steele Sizer, MD  aspirin EC 81 MG tablet Take 1 tablet (81 mg total) by mouth daily. 05/22/15  Yes Megan P Johnson, DO  atorvastatin (LIPITOR) 10 MG tablet Take 1 tablet (10 mg total) by mouth daily. 09/01/15  Yes Steele Sizer, MD  canagliflozin (INVOKANA) 100 MG TABS tablet Take 1 tablet (100 mg total) by mouth daily. 09/01/15  Yes Steele Sizer, MD  chlorpheniramine-HYDROcodone (TUSSIONEX PENNKINETIC ER) 10-8 MG/5ML SUER Take 2.5-5 mLs by mouth every 12 (twelve) hours as needed for cough. 01/19/16  Yes Steele Sizer, MD  lisinopril (PRINIVIL,ZESTRIL) 20 MG tablet Take 1 tablet (20 mg total) by mouth daily. 09/01/15  Yes Steele Sizer, MD  metFORMIN (GLUCOPHAGE) 1000 MG tablet Take 1 tablet (1,000 mg total) by mouth 2 (two) times daily with a meal. 09/03/15  Yes Steele Sizer, MD  metoprolol (LOPRESSOR) 50 MG tablet Take 1 tablet (50 mg total) by mouth 2 (two) times daily.  09/01/15  Yes Steele Sizer, MD  rivaroxaban (XARELTO) 20 MG TABS tablet Take 1 tablet (20 mg total) by mouth daily with supper. 09/01/15  Yes Steele Sizer, MD  sitaGLIPtin (JANUVIA) 100 MG tablet Take 1 tablet (100 mg total) by mouth daily. 01/19/16  Yes Steele Sizer, MD  fluticasone (FLONASE) 50 MCG/ACT nasal spray Place 2 sprays into both nostrils daily. 01/19/16   Steele Sizer, MD    Allergies as of 04/28/2016  . (No Known Allergies)    Family History  Problem Relation Age of Onset  . Diabetes Mellitus II    . Heart failure Father   . Heart attack Neg Hx   . Hypertension Neg Hx     Social History   Social History  . Marital Status: Divorced    Spouse Name: N/A  . Number of Children: N/A  . Years of Education: N/A   Occupational History  . Not on file.   Social History Main Topics  . Smoking status: Never Smoker   . Smokeless tobacco: Never Used  . Alcohol Use: No  . Drug Use: No  . Sexual Activity: Not on file   Other Topics Concern  . Not on file   Social History Narrative   Former Emergency planning/management officer     Review of Systems: See HPI, otherwise negative ROS  Physical Exam: BP 129/89 mmHg  Pulse  97  Temp(Src) 97.8 F (36.6 C)  Resp 16  Ht 5\' 10"  (1.778 m)  Wt 260 lb (117.935 kg)  BMI 37.31 kg/m2  SpO2 99% General:   Alert,  pleasant and cooperative in NAD Head:  Normocephalic and atraumatic. Neck:  Supple; no masses or thyromegaly. Lungs:  Clear throughout to auscultation.    Heart:  Regular rate and rhythm. Abdomen:  Soft, nontender and nondistended. Normal bowel sounds, without guarding, and without rebound.   Neurologic:  Alert and  oriented x4;  grossly normal neurologically.  Impression/Plan: Jeff [name] is now here to undergo a screening colonoscopy.  Risks, benefits, and alternatives regarding colonoscopy have been reviewed with the patient.  Questions have been answered.  All parties agreeable.

## 2016-05-31 ENCOUNTER — Encounter: Payer: Self-pay | Admitting: Gastroenterology

## 2016-06-25 ENCOUNTER — Other Ambulatory Visit: Payer: Self-pay | Admitting: Family Medicine

## 2016-09-10 ENCOUNTER — Other Ambulatory Visit: Payer: Self-pay | Admitting: Family Medicine

## 2016-09-10 NOTE — Telephone Encounter (Signed)
Routing to provider.  No follow up appointment on file.

## 2016-09-18 ENCOUNTER — Other Ambulatory Visit: Payer: Self-pay | Admitting: Family Medicine

## 2016-09-20 NOTE — Telephone Encounter (Signed)
Apt pe 

## 2016-09-23 ENCOUNTER — Other Ambulatory Visit: Payer: Self-pay | Admitting: Family Medicine

## 2016-09-23 DIAGNOSIS — E785 Hyperlipidemia, unspecified: Secondary | ICD-10-CM

## 2016-09-23 DIAGNOSIS — I10 Essential (primary) hypertension: Secondary | ICD-10-CM

## 2016-09-24 NOTE — Telephone Encounter (Signed)
Routing to provider  

## 2016-09-27 NOTE — Telephone Encounter (Signed)
apt 

## 2016-10-04 ENCOUNTER — Encounter: Payer: Self-pay | Admitting: Family Medicine

## 2016-10-04 ENCOUNTER — Ambulatory Visit (INDEPENDENT_AMBULATORY_CARE_PROVIDER_SITE_OTHER): Payer: PPO | Admitting: Family Medicine

## 2016-10-04 VITALS — BP 135/85 | HR 73 | Temp 98.2°F | Ht 69.0 in | Wt 280.0 lb

## 2016-10-04 DIAGNOSIS — Z23 Encounter for immunization: Secondary | ICD-10-CM

## 2016-10-04 DIAGNOSIS — E782 Mixed hyperlipidemia: Secondary | ICD-10-CM | POA: Diagnosis not present

## 2016-10-04 DIAGNOSIS — E1159 Type 2 diabetes mellitus with other circulatory complications: Secondary | ICD-10-CM | POA: Diagnosis not present

## 2016-10-04 DIAGNOSIS — I10 Essential (primary) hypertension: Secondary | ICD-10-CM | POA: Diagnosis not present

## 2016-10-04 DIAGNOSIS — I482 Chronic atrial fibrillation, unspecified: Secondary | ICD-10-CM

## 2016-10-04 MED ORDER — ATORVASTATIN CALCIUM 10 MG PO TABS: 10.0000 mg | ORAL_TABLET | Freq: Every day | ORAL | 4 refills | Status: DC

## 2016-10-04 MED ORDER — AMLODIPINE BESYLATE 10 MG PO TABS: 10.0000 mg | ORAL_TABLET | Freq: Every day | ORAL | 4 refills | Status: DC

## 2016-10-04 MED ORDER — METFORMIN HCL 1000 MG PO TABS: ORAL_TABLET | ORAL | 4 refills | Status: DC

## 2016-10-04 MED ORDER — SITAGLIPTIN PHOSPHATE 100 MG PO TABS: 100.0000 mg | ORAL_TABLET | Freq: Every day | ORAL | 4 refills | Status: DC

## 2016-10-04 MED ORDER — HYDROCOD POLST-CPM POLST ER 10-8 MG/5ML PO SUER: 5.0000 mL | Freq: Two times a day (BID) | ORAL | 0 refills | Status: DC | PRN

## 2016-10-04 MED ORDER — METOPROLOL TARTRATE 50 MG PO TABS: 50.0000 mg | ORAL_TABLET | Freq: Two times a day (BID) | ORAL | 4 refills | Status: DC

## 2016-10-04 MED ORDER — LISINOPRIL 20 MG PO TABS: 20.0000 mg | ORAL_TABLET | Freq: Every day | ORAL | 4 refills | Status: DC

## 2016-10-04 MED ORDER — FLUTICASONE PROPIONATE 50 MCG/ACT NA SUSP: 2.0000 | Freq: Every day | NASAL | 11 refills | Status: DC

## 2016-10-04 NOTE — Assessment & Plan Note (Signed)
Await labs. Medications refilled

## 2016-10-04 NOTE — Progress Notes (Signed)
BP 135/85   Pulse 73   Temp 98.2 F (36.8 C)   Ht 5\' 9"  (1.753 m)   Wt 280 lb (127 kg)   SpO2 98%   BMI 41.35 kg/m    Subjective:    Patient ID: Jeff [name], male    DOB: 03-Nov-1951, 65 y.o.   MRN: [id number]  HPI: Jeff Lynched Allen Fry is a 65 y.o. male  Chief Complaint  Patient presents with  . Annual Exam   Patient presents for medication management. States he does not want his physical today as he is switching insurances and will schedule it for the first of the year. Needs his meidcations refilled today and has patient assistance paperwork that needs to be filled out for xarelto and invokana. Doing well with medications, no side effects reported and taking them faithfully.   Past Medical History:  Diagnosis Date  . Acute systolic heart failure (HCC)    with EF of 40-45% by echocardiogram, liekly tachycardia induced cariomyopathy  . Atrial fibrillation (HCC)   . Chronic low back pain   . Diabetes mellitus without complication (HCC)   . DVT of lower limb, acute (HCC) 2014  . Erectile dysfunction   . HLD (hyperlipidemia)   . Hypertension   . Hypogonadism in male    Social History   Social History  . Marital status: Divorced    Spouse name: N/A  . Number of children: N/A  . Years of education: N/A   Occupational History  . Not on file.   Social History Main Topics  . Smoking status: Never Smoker  . Smokeless tobacco: Never Used  . Alcohol use No  . Drug use: No  . Sexual activity: Not on file   Other Topics Concern  . Not on file   Social History Narrative   Former Emergency planning/management officerpolice officer     Relevant past medical, surgical, family and social history reviewed and updated as indicated. Interim medical history since our last visit reviewed. Allergies and medications reviewed and updated.  Review of Systems  Constitutional: Negative.   HENT: Negative.   Respiratory: Negative.   Cardiovascular: Negative.   Gastrointestinal: Negative.   Genitourinary:  Negative.   Musculoskeletal: Negative.   Skin: Negative.   Neurological: Negative.   Psychiatric/Behavioral: Negative.     Per HPI unless specifically indicated above     Objective:    BP 135/85   Pulse 73   Temp 98.2 F (36.8 C)   Ht 5\' 9"  (1.753 m)   Wt 280 lb (127 kg)   SpO2 98%   BMI 41.35 kg/m   Wt Readings from Last 3 Encounters:  10/04/16 280 lb (127 kg)  05/28/16 260 lb (117.9 kg)  01/26/16 272 lb (123.4 kg)    Physical Exam  Constitutional: He is oriented to person, place, and time. He appears well-developed and well-nourished. No distress.  HENT:  Head: Atraumatic.  Eyes: Conjunctivae are normal. No scleral icterus.  Neck: Normal range of motion. Neck supple.  Cardiovascular: Normal rate.   Pulmonary/Chest: Effort normal and breath sounds normal. No respiratory distress.  Musculoskeletal: Normal range of motion.  Neurological: He is alert and oriented to person, place, and time.  Skin: Skin is warm and dry.  Psychiatric: He has a normal mood and affect. His behavior is normal.  Nursing note and vitals reviewed.     Assessment & Plan:   Problem List Items Addressed This Visit      Cardiovascular and Mediastinum  HYPERTENSION, BENIGN - Primary    Stable, continue current regimen. Refills sent today       Relevant Medications   atorvastatin (LIPITOR) 10 MG tablet   metoprolol (LOPRESSOR) 50 MG tablet   lisinopril (PRINIVIL,ZESTRIL) 20 MG tablet   amLODipine (NORVASC) 10 MG tablet   ATRIAL FIBRILLATION    Managed by Cardiology. Continue current regimen      Relevant Medications   atorvastatin (LIPITOR) 10 MG tablet   metoprolol (LOPRESSOR) 50 MG tablet   lisinopril (PRINIVIL,ZESTRIL) 20 MG tablet   amLODipine (NORVASC) 10 MG tablet     Endocrine   Diabetes (HCC)    Await labs. Medications refilled       Relevant Medications   sitaGLIPtin (JANUVIA) 100 MG tablet   atorvastatin (LIPITOR) 10 MG tablet   lisinopril (PRINIVIL,ZESTRIL) 20 MG  tablet   metFORMIN (GLUCOPHAGE) 1000 MG tablet     Other   Hyperlipidemia    Stable, continue atorvastatin. Await labs      Relevant Medications   atorvastatin (LIPITOR) 10 MG tablet   metoprolol (LOPRESSOR) 50 MG tablet   lisinopril (PRINIVIL,ZESTRIL) 20 MG tablet   amLODipine (NORVASC) 10 MG tablet    Other Visit Diagnoses    Needs flu shot       Need for pneumococcal vaccination       Relevant Orders   Pneumococcal conjugate vaccine 13-valent (Completed)       Follow up plan: Return for physical exam.

## 2016-10-04 NOTE — Assessment & Plan Note (Signed)
Managed by Cardiology. Continue current regimen

## 2016-10-04 NOTE — Assessment & Plan Note (Signed)
Stable, continue atorvastatin. Await labs

## 2016-10-04 NOTE — Assessment & Plan Note (Signed)
Stable, continue current regimen. Refills sent today 

## 2016-10-05 LAB — CBC WITH DIFFERENTIAL/PLATELET
BASOS: 1 %
Basophils Absolute: 0.1 10*3/uL (ref 0.0–0.2)
EOS (ABSOLUTE): 0.1 10*3/uL (ref 0.0–0.4)
Eos: 2 %
HEMOGLOBIN: 17.3 g/dL (ref 12.6–17.7)
Hematocrit: 52 % — ABNORMAL HIGH (ref 37.5–51.0)
IMMATURE GRANS (ABS): 0.1 10*3/uL (ref 0.0–0.1)
IMMATURE GRANULOCYTES: 1 %
LYMPHS: 17 %
Lymphocytes Absolute: 1.4 10*3/uL (ref 0.7–3.1)
MCH: 29.7 pg (ref 26.6–33.0)
MCHC: 33.3 g/dL (ref 31.5–35.7)
MCV: 89 fL (ref 79–97)
MONOCYTES: 8 %
Monocytes Absolute: 0.7 10*3/uL (ref 0.1–0.9)
NEUTROS ABS: 6 10*3/uL (ref 1.4–7.0)
NEUTROS PCT: 71 %
PLATELETS: 210 10*3/uL (ref 150–379)
RBC: 5.82 x10E6/uL — ABNORMAL HIGH (ref 4.14–5.80)
RDW: 13.1 % (ref 12.3–15.4)
WBC: 8.2 10*3/uL (ref 3.4–10.8)

## 2016-10-05 LAB — COMPREHENSIVE METABOLIC PANEL
A/G RATIO: 2.4 — AB (ref 1.2–2.2)
ALBUMIN: 4.5 g/dL (ref 3.6–4.8)
ALT: 18 IU/L (ref 0–44)
AST: 13 IU/L (ref 0–40)
Alkaline Phosphatase: 86 IU/L (ref 39–117)
BILIRUBIN TOTAL: 0.6 mg/dL (ref 0.0–1.2)
BUN / CREAT RATIO: 25 — AB (ref 10–24)
BUN: 26 mg/dL (ref 8–27)
CALCIUM: 9.3 mg/dL (ref 8.6–10.2)
CO2: 23 mmol/L (ref 18–29)
Chloride: 97 mmol/L (ref 96–106)
Creatinine, Ser: 1.06 mg/dL (ref 0.76–1.27)
GFR, EST AFRICAN AMERICAN: 85 mL/min/{1.73_m2} (ref >59)
GFR, EST NON AFRICAN AMERICAN: 73 mL/min/{1.73_m2} (ref >59)
GLUCOSE: 256 mg/dL — AB (ref 65–99)
Globulin, Total: 1.9 g/dL (ref 1.5–4.5)
Potassium: 5.2 mmol/L (ref 3.5–5.2)
Sodium: 138 mmol/L (ref 134–144)
TOTAL PROTEIN: 6.4 g/dL (ref 6.0–8.5)

## 2016-10-05 LAB — LIPID PANEL W/O CHOL/HDL RATIO
Cholesterol, Total: 142 mg/dL (ref 100–199)
HDL: 38 mg/dL — AB (ref >39)
LDL Calculated: 86 mg/dL (ref 0–99)
Triglycerides: 89 mg/dL (ref 0–149)
VLDL CHOLESTEROL CAL: 18 mg/dL (ref 5–40)

## 2016-10-05 LAB — HEMOGLOBIN A1C
Est. average glucose Bld gHb Est-mCnc: 226 mg/dL
HEMOGLOBIN A1C: 9.5 % — AB (ref 4.8–5.6)

## 2016-10-06 ENCOUNTER — Telehealth: Payer: Self-pay | Admitting: Family Medicine

## 2016-10-06 ENCOUNTER — Encounter: Payer: Self-pay | Admitting: Family Medicine

## 2016-10-06 MED ORDER — CANAGLIFLOZIN 300 MG PO TABS: 300.0000 mg | ORAL_TABLET | Freq: Every day | ORAL | 1 refills | Status: DC

## 2016-10-06 NOTE — Telephone Encounter (Signed)
Patient notified

## 2016-10-06 NOTE — Telephone Encounter (Signed)
Please call pt and let him know that his blood sugars are worse, and to be extra diligent about taking all of his medications as prescribed and being very strict with diet and exercise. I have increased his invokana to 300 mg daily, new script sent to pharmacy. He can start this new dose right away. He can discuss further plan at upcoming follow up with Dr. Dossie Arbourrissman

## 2016-11-15 ENCOUNTER — Ambulatory Visit: Payer: PPO | Admitting: Urology

## 2016-11-15 ENCOUNTER — Encounter: Payer: PPO | Admitting: Family Medicine

## 2016-11-15 NOTE — Progress Notes (Unsigned)
11/15/2016 12:52 PM   Jeff [name] 09/29/1951 [id number]  Referring provider: Steele Sizer, MD [address] [place], [place] [address]  No chief complaint on file.   HPI: Patient is a 66 year old Caucasian male who presents today requesting    PMH: Past Medical History:  Diagnosis Date  . Acute systolic heart failure (HCC)    with EF of 40-45% by echocardiogram, liekly tachycardia induced cariomyopathy  . Atrial fibrillation (HCC)   . Chronic low back pain   . Diabetes mellitus without complication (HCC)   . DVT of lower limb, acute (HCC) 2014  . Erectile dysfunction   . HLD (hyperlipidemia)   . Hypertension   . Hypogonadism in male     Surgical History: Past Surgical History:  Procedure Laterality Date  . COLONOSCOPY WITH PROPOFOL N/A 05/28/2016   Procedure: COLONOSCOPY WITH PROPOFOL;  Surgeon: Midge Minium, MD;  [facility];  Service: Endoscopy;  Laterality: N/A;  diabetic - oral meds  . DVT, leg Left   . INSERTION OF VENA CAVA FILTER  2014    Home Medications:  Allergies as of 11/15/2016   No Known Allergies     Medication List       Accurate as of 11/15/16 12:52 PM. Always use your most recent med list.          amLODipine 10 MG tablet Commonly known as:  NORVASC Take 1 tablet (10 mg total) by mouth daily.   atorvastatin 10 MG tablet Commonly known as:  LIPITOR Take 1 tablet (10 mg total) by mouth daily.   canagliflozin 300 MG Tabs tablet Commonly known as:  INVOKANA Take 1 tablet (300 mg total) by mouth daily before breakfast.   chlorpheniramine-HYDROcodone 10-8 MG/5ML Suer Commonly known as:  TUSSIONEX PENNKINETIC ER Take 5 mLs by mouth every 12 (twelve) hours as needed for cough.   fluticasone 50 MCG/ACT nasal spray Commonly known as:  FLONASE Place 2 sprays into both nostrils daily.   lisinopril 20 MG tablet Commonly known as:  PRINIVIL,ZESTRIL Take 1 tablet (20 mg total) by mouth daily.   metFORMIN 1000 MG  tablet Commonly known as:  GLUCOPHAGE take 1 tablet by mouth twice a day WITH A MEAL   metoprolol 50 MG tablet Commonly known as:  LOPRESSOR Take 1 tablet (50 mg total) by mouth 2 (two) times daily.   RA ASPIRIN EC 81 MG EC tablet Generic drug:  aspirin take 1 tablet by mouth once daily   sitaGLIPtin 100 MG tablet Commonly known as:  JANUVIA Take 1 tablet (100 mg total) by mouth daily.   XARELTO 20 MG Tabs tablet Generic drug:  rivaroxaban take 1 tablet by mouth once daily WITH SUPPER       Allergies: No Known Allergies  Family History: Family History  Problem Relation Age of Onset  . Heart failure Father   . Diabetes Father   . Diabetes Mellitus II    . Heart attack Neg Hx   . Hypertension Neg Hx   . Cancer Neg Hx   . COPD Neg Hx   . Stroke Neg Hx     Social History:  reports that he has never smoked. He has never used smokeless tobacco. He reports that he does not drink alcohol or use drugs.  ROS:  Physical Exam: There were no vitals taken for this visit.  Constitutional: Well nourished. Alert and oriented, No acute distress. HEENT: [place] AT, moist mucus membranes. Trachea midline, no masses. Cardiovascular: No clubbing, cyanosis, or edema. Respiratory: Normal respiratory effort, no increased work of breathing. GI: Abdomen is soft, non tender, non distended, no abdominal masses. Liver and spleen not palpable.  No hernias appreciated.  Stool sample for occult testing is not indicated.   GU: No CVA tenderness.  No bladder fullness or masses.  Patient with circumcised/uncircumcised phallus. ***Foreskin easily retracted***  Urethral meatus is patent.  No penile discharge. No penile lesions or rashes. Scrotum without lesions, cysts, rashes and/or edema.  Testicles are located scrotally bilaterally. No masses are appreciated in the testicles. Left and right epididymis are normal. Rectal: Patient with  normal  sphincter tone. Anus and perineum without scarring or rashes. No rectal masses are appreciated. Prostate is approximately *** grams, *** nodules are appreciated. Seminal vesicles are normal. Skin: No rashes, bruises or suspicious lesions. Lymph: No cervical or inguinal adenopathy. Neurologic: Grossly intact, no focal deficits, moving all 4 extremities. Psychiatric: Normal mood and affect.  Laboratory Data: Lab Results  Component Value Date   WBC 8.2 10/04/2016   HGB 15.2 02/22/2012   HCT 52.0 (H) 10/04/2016   MCV 89 10/04/2016   PLT 210 10/04/2016    Lab Results  Component Value Date   CREATININE 1.06 10/04/2016     Lab Results  Component Value Date   HGBA1C 9.5 (H) 10/04/2016    Lab Results  Component Value Date   TSH 3.600 09/01/2015       Component Value Date/Time   CHOL 142 10/04/2016 1004   CHOL 106 01/19/2016 0846   HDL 38 (L) 10/04/2016 1004   CHOLHDL 4.2 09/01/2015 0941   VLDL 23 01/19/2016 0846   LDLCALC 86 10/04/2016 1004    Lab Results  Component Value Date   AST 13 10/04/2016   Lab Results  Component Value Date   ALT 18 10/04/2016    Urinalysis    Component Value Date/Time   APPEARANCEUR Clear 09/01/2015 0941   GLUCOSEU 3+ (A) 09/01/2015 0941   BILIRUBINUR Negative 09/01/2015 0941   PROTEINUR Negative 09/01/2015 0941   NITRITE Negative 09/01/2015 0941   LEUKOCYTESUR Negative 09/01/2015 0941    Pertinent Imaging: ***  Assessment & Plan:  ***    No Follow-up on file.  These notes generated with voice recognition software. I apologize for typographical errors.  Michiel CowboySHANNON Batina Dougan, PA-C  [facility] [address], [address] [place], [place] [address] [phone]

## 2016-11-17 ENCOUNTER — Encounter: Payer: PPO | Admitting: Family Medicine

## 2016-11-17 ENCOUNTER — Telehealth: Payer: Self-pay | Admitting: Family Medicine

## 2016-11-17 DIAGNOSIS — E782 Mixed hyperlipidemia: Secondary | ICD-10-CM

## 2016-11-17 DIAGNOSIS — I10 Essential (primary) hypertension: Secondary | ICD-10-CM

## 2016-11-17 NOTE — Telephone Encounter (Signed)
Left message to call.

## 2016-11-17 NOTE — Telephone Encounter (Signed)
call

## 2016-11-17 NOTE — Telephone Encounter (Signed)
Routing to provider  

## 2016-11-18 MED ORDER — METFORMIN HCL 1000 MG PO TABS: ORAL_TABLET | ORAL | 4 refills | Status: DC

## 2016-11-18 MED ORDER — RIVAROXABAN 20 MG PO TABS: 20.0000 mg | ORAL_TABLET | Freq: Every day | ORAL | 3 refills | Status: DC

## 2016-11-18 MED ORDER — AMLODIPINE BESYLATE 10 MG PO TABS: 10.0000 mg | ORAL_TABLET | Freq: Every day | ORAL | 4 refills | Status: DC

## 2016-11-18 MED ORDER — CANAGLIFLOZIN 300 MG PO TABS: 300.0000 mg | ORAL_TABLET | Freq: Every day | ORAL | 1 refills | Status: DC

## 2016-11-18 MED ORDER — LISINOPRIL 20 MG PO TABS
20.0000 mg | ORAL_TABLET | Freq: Every day | ORAL | 4 refills | Status: DC
Start: 2016-11-18 — End: 2017-11-23

## 2016-11-18 MED ORDER — ATORVASTATIN CALCIUM 10 MG PO TABS: 10.0000 mg | ORAL_TABLET | Freq: Every day | ORAL | 4 refills | Status: DC

## 2016-11-18 MED ORDER — SITAGLIPTIN PHOSPHATE 100 MG PO TABS: 100.0000 mg | ORAL_TABLET | Freq: Every day | ORAL | 4 refills | Status: DC

## 2016-11-18 MED ORDER — SCOPOLAMINE 1 MG/3DAYS TD PT72: 1.0000 | MEDICATED_PATCH | TRANSDERMAL | 2 refills | Status: DC

## 2016-11-18 MED ORDER — METOPROLOL TARTRATE 50 MG PO TABS: 50.0000 mg | ORAL_TABLET | Freq: Two times a day (BID) | ORAL | 4 refills | Status: DC

## 2016-11-18 NOTE — Telephone Encounter (Signed)
Patient transferred to provider via telephone 

## 2016-12-16 ENCOUNTER — Other Ambulatory Visit: Payer: Self-pay | Admitting: Family Medicine

## 2016-12-16 DIAGNOSIS — I10 Essential (primary) hypertension: Secondary | ICD-10-CM

## 2016-12-16 DIAGNOSIS — E785 Hyperlipidemia, unspecified: Secondary | ICD-10-CM

## 2016-12-16 NOTE — Telephone Encounter (Signed)
Last OV: 10/04/16 Next OV: 12/20/16  BMP Latest Ref Rng & Units 10/04/2016 01/19/2016 09/01/2015  Glucose 65 - 99 mg/dL 295(A256(H) 213(Y158(H) 865(H194(H)  BUN 8 - 27 mg/dL 26 19 84(O29(H)  Creatinine 0.76 - 1.27 mg/dL 9.621.06 9.521.27 8.41(L1.43(H)  BUN/Creat Ratio 10 - 24 25(H) 15 20  Sodium 134 - 144 mmol/L 138 138 138  Potassium 3.5 - 5.2 mmol/L 5.2 4.4 5.4(H)  Chloride 96 - 106 mmol/L 97 96 98  CO2 18 - 29 mmol/L 23 22 18   Calcium 8.6 - 10.2 mg/dL 9.3 9.3 9.7    Lab Results  Component Value Date   CHOL 142 10/04/2016   HDL 38 (L) 10/04/2016   LDLCALC 86 10/04/2016   TRIG 89 10/04/2016   CHOLHDL 4.2 09/01/2015   Lab Results  Component Value Date   CREATININE 1.06 10/04/2016   BUN 26 10/04/2016   NA 138 10/04/2016   K 5.2 10/04/2016   CL 97 10/04/2016   CO2 23 10/04/2016     Lab Results  Component Value Date   HGBA1C 9.5 (H) 10/04/2016

## 2016-12-20 ENCOUNTER — Encounter: Payer: Self-pay | Admitting: Family Medicine

## 2016-12-20 ENCOUNTER — Ambulatory Visit (INDEPENDENT_AMBULATORY_CARE_PROVIDER_SITE_OTHER): Payer: Medicare HMO | Admitting: Family Medicine

## 2016-12-20 VITALS — BP 134/85 | HR 81 | Temp 98.2°F | Ht 69.29 in | Wt 262.0 lb

## 2016-12-20 DIAGNOSIS — Z0000 Encounter for general adult medical examination without abnormal findings: Secondary | ICD-10-CM

## 2016-12-20 DIAGNOSIS — E1159 Type 2 diabetes mellitus with other circulatory complications: Secondary | ICD-10-CM

## 2016-12-20 DIAGNOSIS — N401 Enlarged prostate with lower urinary tract symptoms: Secondary | ICD-10-CM

## 2016-12-20 DIAGNOSIS — I10 Essential (primary) hypertension: Secondary | ICD-10-CM | POA: Diagnosis not present

## 2016-12-20 DIAGNOSIS — Z1329 Encounter for screening for other suspected endocrine disorder: Secondary | ICD-10-CM

## 2016-12-20 DIAGNOSIS — I420 Dilated cardiomyopathy: Secondary | ICD-10-CM | POA: Diagnosis not present

## 2016-12-20 DIAGNOSIS — I482 Chronic atrial fibrillation, unspecified: Secondary | ICD-10-CM

## 2016-12-20 DIAGNOSIS — E782 Mixed hyperlipidemia: Secondary | ICD-10-CM

## 2016-12-20 DIAGNOSIS — Z125 Encounter for screening for malignant neoplasm of prostate: Secondary | ICD-10-CM

## 2016-12-20 DIAGNOSIS — N400 Enlarged prostate without lower urinary tract symptoms: Secondary | ICD-10-CM | POA: Insufficient documentation

## 2016-12-20 MED ORDER — CANAGLIFLOZIN 300 MG PO TABS: 300.0000 mg | ORAL_TABLET | Freq: Every day | ORAL | 4 refills | Status: DC

## 2016-12-20 MED ORDER — RIVAROXABAN 20 MG PO TABS: 20.0000 mg | ORAL_TABLET | Freq: Every day | ORAL | 4 refills | Status: DC

## 2016-12-20 MED ORDER — METFORMIN HCL 1000 MG PO TABS: 1000.0000 mg | ORAL_TABLET | Freq: Two times a day (BID) | ORAL | 4 refills | Status: DC

## 2016-12-20 NOTE — Assessment & Plan Note (Signed)
The current medical regimen is effective;  continue present plan and medications.  

## 2016-12-20 NOTE — Assessment & Plan Note (Addendum)
The current medical regimen is effective;  continue present plan and medications. Great A1c dropped continue lifestyle changes and follow-up 3 months.

## 2016-12-20 NOTE — Progress Notes (Signed)
BP 134/85   Pulse 81   Temp 98.2 F (36.8 C) (Oral)   Ht 5' 9.29" (1.76 m)   Wt 262 lb (118.8 kg)   SpO2 98%   BMI 38.37 kg/m    Subjective:    Patient ID: Jeff [name], male    DOB: 19-Jul-1951, 66 y.o.   MRN: [id number]  HPI: Jeff [name] is a 66 y.o. male  Chief Complaint  Patient presents with  . Annual Exam  AWV metrics met [facility] to [facility]  Patient's been doing well with weight loss no complaints has been eating better and is been very physically strong. Blood pressure doing well no complaints from medications. Cholesterol doing well. Diabetes no low blood sugar spells no issues or problems takes medicines faithfully. CHF atrial fibrillation all stable takes blood thinner without bleeding bruising issues.   Relevant past medical, surgical, family and social history reviewed and updated as indicated. Interim medical history since our last visit reviewed. Allergies and medications reviewed and updated.  Review of Systems  Constitutional: Negative.   HENT: Negative.   Eyes: Negative.   Respiratory: Negative.   Cardiovascular: Negative.   Gastrointestinal: Negative.   Endocrine: Negative.   Genitourinary: Negative.   Musculoskeletal: Negative.   Skin: Negative.   Allergic/Immunologic: Negative.   Neurological: Negative.   Hematological: Negative.   Psychiatric/Behavioral: Negative.     Per HPI unless specifically indicated above     Objective:    BP 134/85   Pulse 81   Temp 98.2 F (36.8 C) (Oral)   Ht 5' 9.29" (1.76 m)   Wt 262 lb (118.8 kg)   SpO2 98%   BMI 38.37 kg/m   Wt Readings from Last 3 Encounters:  12/20/16 262 lb (118.8 kg)  10/04/16 280 lb (127 kg)  05/28/16 260 lb (117.9 kg)    Physical Exam  Constitutional: He is oriented to person, place, and time. He appears well-developed and well-nourished.  HENT:  Head: Normocephalic and atraumatic.  Right Ear: External ear normal.  Left Ear: External ear normal.  Eyes: Conjunctivae  and EOM are normal. Pupils are equal, round, and reactive to light.  Neck: Normal range of motion. Neck supple.  Cardiovascular: Normal rate, regular rhythm, normal heart sounds and intact distal pulses.   Pulmonary/Chest: Effort normal and breath sounds normal.  Abdominal: Soft. Bowel sounds are normal. There is no splenomegaly or hepatomegaly.  Genitourinary: Rectum normal and penis normal.  Genitourinary Comments: BPH  Musculoskeletal: Normal range of motion.  Neurological: He is alert and oriented to person, place, and time. He has normal reflexes.  Skin: No rash noted. No erythema.  Psychiatric: He has a normal mood and affect. His behavior is normal. Judgment and thought content normal.    Results for orders placed or performed in visit on 10/04/16  CBC with Differential/Platelet  Result Value Ref Range   WBC 8.2 3.4 - 10.8 x10E3/uL   RBC 5.82 (H) 4.14 - 5.80 x10E6/uL   Hemoglobin 17.3 12.6 - 17.7 g/dL   Hematocrit 52.0 (H) 37.5 - 51.0 %   MCV 89 79 - 97 fL   MCH 29.7 26.6 - 33.0 pg   MCHC 33.3 31.5 - 35.7 g/dL   RDW 13.1 12.3 - 15.4 %   Platelets 210 150 - 379 x10E3/uL   Neutrophils 71 Not Estab. %   Lymphs 17 Not Estab. %   Monocytes 8 Not Estab. %   Eos 2 Not Estab. %   Basos 1 Not  Estab. %   Neutrophils Absolute 6.0 1.4 - 7.0 x10E3/uL   Lymphocytes Absolute 1.4 0.7 - 3.1 x10E3/uL   Monocytes Absolute 0.7 0.1 - 0.9 x10E3/uL   EOS (ABSOLUTE) 0.1 0.0 - 0.4 x10E3/uL   Basophils Absolute 0.1 0.0 - 0.2 x10E3/uL   Immature Granulocytes 1 Not Estab. %   Immature Grans (Abs) 0.1 0.0 - 0.1 x10E3/uL  Comprehensive metabolic panel  Result Value Ref Range   Glucose 256 (H) 65 - 99 mg/dL   BUN 26 8 - 27 mg/dL   Creatinine, Ser 1.06 0.76 - 1.27 mg/dL   GFR calc non Af Amer 73 >59 mL/min/1.73   GFR calc Af Amer 85 >59 mL/min/1.73   BUN/Creatinine Ratio 25 (H) 10 - 24   Sodium 138 134 - 144 mmol/L   Potassium 5.2 3.5 - 5.2 mmol/L   Chloride 97 96 - 106 mmol/L   CO2 23 18 - 29  mmol/L   Calcium 9.3 8.6 - 10.2 mg/dL   Total Protein 6.4 6.0 - 8.5 g/dL   Albumin 4.5 3.6 - 4.8 g/dL   Globulin, Total 1.9 1.5 - 4.5 g/dL   Albumin/Globulin Ratio 2.4 (H) 1.2 - 2.2   Bilirubin Total 0.6 0.0 - 1.2 mg/dL   Alkaline Phosphatase 86 39 - 117 IU/L   AST 13 0 - 40 IU/L   ALT 18 0 - 44 IU/L  Lipid Panel w/o Chol/HDL Ratio  Result Value Ref Range   Cholesterol, Total 142 100 - 199 mg/dL   Triglycerides 89 0 - 149 mg/dL   HDL 38 (L) >39 mg/dL   VLDL Cholesterol Cal 18 5 - 40 mg/dL   LDL Calculated 86 0 - 99 mg/dL  HgB A1c  Result Value Ref Range   Hgb A1c MFr Bld 9.5 (H) 4.8 - 5.6 %   Est. average glucose Bld gHb Est-mCnc 226 mg/dL      Assessment & Plan:   Problem List Items Addressed This Visit      Cardiovascular and Mediastinum   HYPERTENSION, BENIGN    The current medical regimen is effective;  continue present plan and medications.       Relevant Medications   rivaroxaban (XARELTO) 20 MG TABS tablet   Other Relevant Orders   CBC with Differential/Platelet   Comprehensive metabolic panel   Urinalysis, Routine w reflex microscopic   Bayer DCA Hb A1c Waived   Congestive dilated cardiomyopathy (HCC)    The current medical regimen is effective;  continue present plan and medications.       Relevant Medications   rivaroxaban (XARELTO) 20 MG TABS tablet   ATRIAL FIBRILLATION    The current medical regimen is effective;  continue present plan and medications.       Relevant Medications   rivaroxaban (XARELTO) 20 MG TABS tablet     Endocrine   Diabetes ([place])    The current medical regimen is effective;  continue present plan and medications. Great A1c dropped continue lifestyle changes and follow-up 3 months.      Relevant Medications   canagliflozin (INVOKANA) 300 MG TABS tablet   metFORMIN (GLUCOPHAGE) 1000 MG tablet   Other Relevant Orders   CBC with Differential/Platelet   Comprehensive metabolic panel   Urinalysis, Routine w reflex  microscopic   Bayer DCA Hb A1c Waived     Genitourinary   BPH (benign prostatic hyperplasia)     Other   Hyperlipidemia    The current medical regimen is effective;  continue present plan  and medications.       Relevant Medications   rivaroxaban (XARELTO) 20 MG TABS tablet   Other Relevant Orders   CBC with Differential/Platelet   Comprehensive metabolic panel   Lipid panel   Urinalysis, Routine w reflex microscopic   Bayer DCA Hb A1c Waived    Other Visit Diagnoses    Annual physical exam    -  Primary   Relevant Orders   CBC with Differential/Platelet   Comprehensive metabolic panel   Lipid panel   PSA   TSH   Urinalysis, Routine w reflex microscopic   Bayer DCA Hb A1c Waived   Prostate cancer screening       Relevant Orders   PSA   Thyroid disorder screen       Relevant Orders   TSH       Follow up plan: Return in about 3 months (around 03/19/2017) for Hemoglobin A1c.

## 2016-12-21 ENCOUNTER — Encounter: Payer: Self-pay | Admitting: Family Medicine

## 2016-12-21 LAB — COMPREHENSIVE METABOLIC PANEL
ALK PHOS: 75 IU/L (ref 39–117)
ALT: 18 IU/L (ref 0–44)
AST: 18 IU/L (ref 0–40)
Albumin/Globulin Ratio: 2.1 (ref 1.2–2.2)
Albumin: 4.6 g/dL (ref 3.6–4.8)
BUN/Creatinine Ratio: 29 — ABNORMAL HIGH (ref 10–24)
BUN: 33 mg/dL — AB (ref 8–27)
Bilirubin Total: 0.8 mg/dL (ref 0.0–1.2)
CO2: 25 mmol/L (ref 18–29)
CREATININE: 1.14 mg/dL (ref 0.76–1.27)
Calcium: 9.7 mg/dL (ref 8.6–10.2)
Chloride: 101 mmol/L (ref 96–106)
GFR calc Af Amer: 78 mL/min/{1.73_m2} (ref >59)
GFR calc non Af Amer: 67 mL/min/{1.73_m2} (ref >59)
GLUCOSE: 120 mg/dL — AB (ref 65–99)
Globulin, Total: 2.2 g/dL (ref 1.5–4.5)
Potassium: 5 mmol/L (ref 3.5–5.2)
SODIUM: 145 mmol/L — AB (ref 134–144)
Total Protein: 6.8 g/dL (ref 6.0–8.5)

## 2016-12-21 LAB — BAYER DCA HB A1C WAIVED: HB A1C (BAYER DCA - WAIVED): 7.3 % — ABNORMAL HIGH (ref <7.0)

## 2016-12-21 LAB — URINALYSIS, ROUTINE W REFLEX MICROSCOPIC
Bilirubin, UA: NEGATIVE
Ketones, UA: NEGATIVE
LEUKOCYTES UA: NEGATIVE
NITRITE UA: NEGATIVE
PH UA: 5.5 (ref 5.0–7.5)
RBC UA: NEGATIVE
SPEC GRAV UA: 1.025 (ref 1.005–1.030)
Urobilinogen, Ur: 1 mg/dL (ref 0.2–1.0)

## 2016-12-21 LAB — CBC WITH DIFFERENTIAL/PLATELET
BASOS ABS: 0 10*3/uL (ref 0.0–0.2)
Basos: 1 %
EOS (ABSOLUTE): 0.1 10*3/uL (ref 0.0–0.4)
Eos: 2 %
Hematocrit: 48.1 % (ref 37.5–51.0)
Hemoglobin: 16.4 g/dL (ref 13.0–17.7)
Immature Grans (Abs): 0 10*3/uL (ref 0.0–0.1)
Immature Granulocytes: 0 %
LYMPHS ABS: 1.7 10*3/uL (ref 0.7–3.1)
Lymphs: 20 %
MCH: 29.9 pg (ref 26.6–33.0)
MCHC: 34.1 g/dL (ref 31.5–35.7)
MCV: 88 fL (ref 79–97)
MONOCYTES: 9 %
MONOS ABS: 0.7 10*3/uL (ref 0.1–0.9)
Neutrophils Absolute: 5.9 10*3/uL (ref 1.4–7.0)
Neutrophils: 68 %
PLATELETS: 248 10*3/uL (ref 150–379)
RBC: 5.48 x10E6/uL (ref 4.14–5.80)
RDW: 14.1 % (ref 12.3–15.4)
WBC: 8.6 10*3/uL (ref 3.4–10.8)

## 2016-12-21 LAB — TSH: TSH: 2 u[IU]/mL (ref 0.450–4.500)

## 2016-12-21 LAB — LIPID PANEL
CHOLESTEROL TOTAL: 115 mg/dL (ref 100–199)
Chol/HDL Ratio: 3.6 ratio units (ref 0.0–5.0)
HDL: 32 mg/dL — ABNORMAL LOW (ref >39)
LDL Calculated: 37 mg/dL (ref 0–99)
TRIGLYCERIDES: 229 mg/dL — AB (ref 0–149)
VLDL CHOLESTEROL CAL: 46 mg/dL — AB (ref 5–40)

## 2016-12-21 LAB — PSA: Prostate Specific Ag, Serum: 1.8 ng/mL (ref 0.0–4.0)

## 2016-12-28 ENCOUNTER — Encounter: Payer: Self-pay | Admitting: Family Medicine

## 2016-12-28 ENCOUNTER — Ambulatory Visit (INDEPENDENT_AMBULATORY_CARE_PROVIDER_SITE_OTHER): Payer: Medicare HMO | Admitting: Family Medicine

## 2016-12-28 VITALS — BP 121/82 | HR 101 | Temp 97.5°F | Wt 268.0 lb

## 2016-12-28 DIAGNOSIS — J019 Acute sinusitis, unspecified: Secondary | ICD-10-CM | POA: Diagnosis not present

## 2016-12-28 MED ORDER — AMOXICILLIN-POT CLAVULANATE 875-125 MG PO TABS: 1.0000 | ORAL_TABLET | Freq: Two times a day (BID) | ORAL | 0 refills | Status: DC

## 2016-12-28 NOTE — Progress Notes (Signed)
BP 121/82   Pulse (!) 101   Temp 97.5 F (36.4 C) (Oral)   Wt 268 lb (121.6 kg)   SpO2 98%   BMI 39.24 kg/m    Subjective:    Patient ID: Jeff LynchNed Allen [name], male    DOB: Apr 23, 1951, 66 y.o.   MRN: [id number]  HPI: Jeff Lynched Allen Yepez is a 66 y.o. male  Chief Complaint  Patient presents with  . Sinus Problem  . Nasal Congestion   Patient with 4-5 days of marked systemic symptoms of fever chills and aches. This has some flulike symptoms but more sinus pressure congestion and coughing. Has a old prescription for Tussionex  Some ear congestion pressure more the right than the left. Relevant past medical, surgical, family and social history reviewed and updated as indicated. Interim medical history since our last visit reviewed. Allergies and medications reviewed and updated.  Review of Systems  Constitutional: Positive for chills, diaphoresis, fatigue and fever.  HENT: Positive for congestion, ear pain, hearing loss, rhinorrhea, sinus pain, sinus pressure, sneezing and sore throat.   Respiratory: Positive for cough. Negative for wheezing.   Cardiovascular: Negative.     Per HPI unless specifically indicated above     Objective:    BP 121/82   Pulse (!) 101   Temp 97.5 F (36.4 C) (Oral)   Wt 268 lb (121.6 kg)   SpO2 98%   BMI 39.24 kg/m   Wt Readings from Last 3 Encounters:  12/28/16 268 lb (121.6 kg)  12/20/16 262 lb (118.8 kg)  10/04/16 280 lb (127 kg)    Physical Exam  Constitutional: He is oriented to person, place, and time. He appears well-developed and well-nourished. No distress.  HENT:  Head: Normocephalic and atraumatic.  Right Ear: Hearing normal.  Left Ear: Hearing normal.  Nose: Nose normal.  Ears TMs slightly injected more so on the left than the right  Eyes: Conjunctivae and lids are normal. Right eye exhibits no discharge. Left eye exhibits no discharge. No scleral icterus.  Pulmonary/Chest: Effort normal. No respiratory distress.  Musculoskeletal:  Normal range of motion.  Neurological: He is alert and oriented to person, place, and time.  Skin: Skin is intact. No rash noted.  Psychiatric: He has a normal mood and affect. His speech is normal and behavior is normal. Judgment and thought content normal. Cognition and memory are normal.    Results for orders placed or performed in visit on 12/20/16  CBC with Differential/Platelet  Result Value Ref Range   WBC 8.6 3.4 - 10.8 x10E3/uL   RBC 5.48 4.14 - 5.80 x10E6/uL   Hemoglobin 16.4 13.0 - 17.7 g/dL   Hematocrit 08.648.1 57.837.5 - 51.0 %   MCV 88 79 - 97 fL   MCH 29.9 26.6 - 33.0 pg   MCHC 34.1 31.5 - 35.7 g/dL   RDW 46.914.1 62.912.3 - 52.815.4 %   Platelets 248 150 - 379 x10E3/uL   Neutrophils 68 Not Estab. %   Lymphs 20 Not Estab. %   Monocytes 9 Not Estab. %   Eos 2 Not Estab. %   Basos 1 Not Estab. %   Neutrophils Absolute 5.9 1.4 - 7.0 x10E3/uL   Lymphocytes Absolute 1.7 0.7 - 3.1 x10E3/uL   Monocytes Absolute 0.7 0.1 - 0.9 x10E3/uL   EOS (ABSOLUTE) 0.1 0.0 - 0.4 x10E3/uL   Basophils Absolute 0.0 0.0 - 0.2 x10E3/uL   Immature Granulocytes 0 Not Estab. %   Immature Grans (Abs) 0.0 0.0 - 0.1 x10E3/uL  Comprehensive metabolic panel  Result Value Ref Range   Glucose 120 (H) 65 - 99 mg/dL   BUN 33 (H) 8 - 27 mg/dL   Creatinine, Ser 1.61 0.76 - 1.27 mg/dL   GFR calc non Af Amer 67 >59 mL/min/1.73   GFR calc Af Amer 78 >59 mL/min/1.73   BUN/Creatinine Ratio 29 (H) 10 - 24   Sodium 145 (H) 134 - 144 mmol/L   Potassium 5.0 3.5 - 5.2 mmol/L   Chloride 101 96 - 106 mmol/L   CO2 25 18 - 29 mmol/L   Calcium 9.7 8.6 - 10.2 mg/dL   Total Protein 6.8 6.0 - 8.5 g/dL   Albumin 4.6 3.6 - 4.8 g/dL   Globulin, Total 2.2 1.5 - 4.5 g/dL   Albumin/Globulin Ratio 2.1 1.2 - 2.2   Bilirubin Total 0.8 0.0 - 1.2 mg/dL   Alkaline Phosphatase 75 39 - 117 IU/L   AST 18 0 - 40 IU/L   ALT 18 0 - 44 IU/L  Lipid panel  Result Value Ref Range   Cholesterol, Total 115 100 - 199 mg/dL   Triglycerides 096 (H) 0  - 149 mg/dL   HDL 32 (L) >04 mg/dL   VLDL Cholesterol Cal 46 (H) 5 - 40 mg/dL   LDL Calculated 37 0 - 99 mg/dL   Chol/HDL Ratio 3.6 0.0 - 5.0 ratio units  PSA  Result Value Ref Range   Prostate Specific Ag, Serum 1.8 0.0 - 4.0 ng/mL  TSH  Result Value Ref Range   TSH 2.000 0.450 - 4.500 uIU/mL  Urinalysis, Routine w reflex microscopic  Result Value Ref Range   Specific Gravity, UA 1.025 1.005 - 1.030   pH, UA 5.5 5.0 - 7.5   Color, UA Yellow Yellow   Appearance Ur Clear Clear   Leukocytes, UA Negative Negative   Protein, UA Trace (A) Negative/Trace   Glucose, UA Trace (A) Negative   Ketones, UA Negative Negative   RBC, UA Negative Negative   Bilirubin, UA Negative Negative   Urobilinogen, Ur 1.0 0.2 - 1.0 mg/dL   Nitrite, UA Negative Negative  Bayer DCA Hb A1c Waived  Result Value Ref Range   Bayer DCA Hb A1c Waived 7.3 (H) <7.0 %      Assessment & Plan:   Problem List Items Addressed This Visit    None    Visit Diagnoses    Acute sinusitis, recurrence not specified, unspecified location    -  Primary   Discussed sinusitis care and treatment use of antibiotics, Tylenol, fluids, nasal rinse, Flonase, over-the-counter medications   Relevant Medications   amoxicillin-clavulanate (AUGMENTIN) 875-125 MG tablet       Follow up plan: Return if symptoms worsen or fail to improve, for As scheduled.

## 2016-12-30 ENCOUNTER — Telehealth: Payer: Self-pay

## 2016-12-30 NOTE — Telephone Encounter (Signed)
Actually, the [facility] [place] [facility] requires a patient to be seen face to face on a yearly basis for prescription medications.  A provider can lose their license if they prescribe medications without seeing patients at least every year.  I agree with the Medical Board as medications have side effects and need monitoring.  It is not a "money havoc."  It is good medical care.  He needs to make an appointment before he receives a refill on Trimix.

## 2016-12-30 NOTE — Telephone Encounter (Signed)
Pt called stating he has not been seen at [facility] since June of 2016. Pt then stated that he saw Dr.Chrismon 2 weeks ago for a complete physical. Per pt Dr. Shella Spearinghrismon office told him that he did not have to be seen in our office to get a refill on trimix. Made pt aware of [facility] policy and insurance policies. Pt stated that is crazy and just a money havoc. Please advise.

## 2016-12-30 NOTE — Telephone Encounter (Signed)
Spoke with pt about being having to be seen in order to get a trimix refill. Pt voiced understanding and hung up.

## 2016-12-31 ENCOUNTER — Encounter: Payer: Self-pay | Admitting: Urology

## 2016-12-31 ENCOUNTER — Ambulatory Visit (INDEPENDENT_AMBULATORY_CARE_PROVIDER_SITE_OTHER): Payer: Medicare HMO | Admitting: Urology

## 2016-12-31 ENCOUNTER — Telehealth: Payer: Self-pay | Admitting: Urology

## 2016-12-31 VITALS — BP 125/82 | HR 87 | Ht 70.0 in | Wt 262.0 lb

## 2016-12-31 DIAGNOSIS — N529 Male erectile dysfunction, unspecified: Secondary | ICD-10-CM

## 2016-12-31 DIAGNOSIS — N400 Enlarged prostate without lower urinary tract symptoms: Secondary | ICD-10-CM

## 2016-12-31 NOTE — Telephone Encounter (Signed)
Would you call in a prescription for Trimix for this patient (10 mcg PGE1, 1 mg PHENT and 30 mg PAPA) to [facility]?

## 2016-12-31 NOTE — Telephone Encounter (Signed)
Medication called into [facility].  

## 2016-12-31 NOTE — Progress Notes (Signed)
12/31/2016 11:49 AM   [place] [name] 18-Dec-1950 [id number]  Referring provider: Steele Sizer, MD [address] [place], [place] [address]  Chief Complaint  Patient presents with  . Erectile Dysfunction    wants to discuss Trimix injections last seen 2015    HPI: 66 yo WM with ED who presents today for a follow up visit to refill his Trimix.  His SHIM score is 18, which is mild ED.   He has been having difficulty with erections for several years.   His major complaint is lack of firmness for satisfactory intercourse.  His libido is preserved.   His risk factors for ED are age, BPH, DM, HTN, HLD, hypogonadism and blood pressure medications.  He denies any painful erections or curvatures with his erections.   He is having good success with Trimix injections for the last several years.  He is using 2 to 3 mcg of Trimix [name] this time.       SHIM    Row Name 12/31/16 1109         SHIM: Over the last 6 months:   How do you rate your confidence that you could get and keep an erection? Low     When you had erections with sexual stimulation, how often were your erections hard enough for penetration (entering your partner)? Most Times (much more than half the time)     During sexual intercourse, how often were you able to maintain your erection after you had penetrated (entered) your partner? Slightly Difficult     During sexual intercourse, how difficult was it to maintain your erection to completion of intercourse? Slightly Difficult     When you attempted sexual intercourse, how often was it satisfactory for you? Slightly Difficult       SHIM Total Score   SHIM 18        Score: 1-7 Severe ED 8-11 Moderate ED 12-16 Mild-Moderate ED 17-21 Mild ED 22-25 No ED  PMH: Past Medical History:  Diagnosis Date  . Acute systolic heart failure (HCC)    with EF of 40-45% by echocardiogram, liekly tachycardia induced cariomyopathy  . Atrial fibrillation (HCC)   . Chronic low back  pain   . Diabetes mellitus without complication (HCC)   . DVT of lower limb, acute (HCC) 2014  . Erectile dysfunction   . HLD (hyperlipidemia)   . Hypertension   . Hypogonadism in male     Surgical History: Past Surgical History:  Procedure Laterality Date  . COLONOSCOPY WITH PROPOFOL N/A 05/28/2016   Procedure: COLONOSCOPY WITH PROPOFOL;  Surgeon: Midge Minium, MD;  Location[facility];  Service: Endoscopy;  Laterality: N/A;  diabetic - oral meds  . DVT, leg Left   . INSERTION OF VENA CAVA FILTER  2014    Home Medications:  Allergies as of 12/31/2016   No Known Allergies     Medication List       Accurate as of 12/31/16 11:49 AM. Always use your most recent med list.          amLODipine 10 MG tablet Commonly known as:  NORVASC take 1 tablet by mouth once daily   amoxicillin-clavulanate 875-125 MG tablet Commonly known as:  AUGMENTIN Take 1 tablet by mouth 2 (two) times daily.   atorvastatin 10 MG tablet Commonly known as:  LIPITOR take 1 tablet by mouth once daily   canagliflozin 300 MG Tabs tablet Commonly known as:  INVOKANA Take 1 tablet (300  mg total) by mouth daily before breakfast.   fluticasone 50 MCG/ACT nasal spray Commonly known as:  FLONASE Place 2 sprays into both nostrils daily.   lisinopril 20 MG tablet Commonly known as:  PRINIVIL,ZESTRIL Take 1 tablet (20 mg total) by mouth daily.   lisinopril 20 MG tablet Commonly known as:  PRINIVIL,ZESTRIL take 1 tablet by mouth once daily   metFORMIN 1000 MG tablet Commonly known as:  GLUCOPHAGE Take 1 tablet (1,000 mg total) by mouth 2 (two) times daily with a meal.   metoprolol 50 MG tablet Commonly known as:  LOPRESSOR take 1 tablet by mouth twice a day   RA ASPIRIN EC 81 MG EC tablet Generic drug:  aspirin take 1 tablet by mouth once daily   rivaroxaban 20 MG Tabs tablet Commonly known as:  XARELTO Take 1 tablet (20 mg total) by mouth daily.   sitaGLIPtin 100 MG tablet Commonly  known as:  JANUVIA Take 1 tablet (100 mg total) by mouth daily.       Allergies: No Known Allergies  Family History: Family History  Problem Relation Age of Onset  . Heart failure Father   . Diabetes Father   . Diabetes Mellitus II    . Heart attack Neg Hx   . Hypertension Neg Hx   . Cancer Neg Hx   . COPD Neg Hx   . Stroke Neg Hx   . Prostate cancer Neg Hx   . Kidney cancer Neg Hx   . Bladder Cancer Neg Hx     Social History:  reports that he has never smoked. He has never used smokeless tobacco. He reports that he does not drink alcohol or use drugs.  ROS: UROLOGY Frequent Urination?: No Hard to postpone urination?: No Burning/pain with urination?: No Get up [name] night to urinate?: No Leakage of urine?: No Urine stream starts and stops?: No Trouble starting stream?: No Do you have to strain to urinate?: No Blood in urine?: No Urinary tract infection?: No Sexually transmitted disease?: No Injury to kidneys or bladder?: No Painful intercourse?: No Weak stream?: No Erection problems?: No Penile pain?: No  Gastrointestinal Nausea?: No Vomiting?: No Indigestion/heartburn?: No Diarrhea?: No Constipation?: No  Constitutional Fever: No Night sweats?: No Weight loss?: No Fatigue?: No  Skin Skin rash/lesions?: No Itching?: No  Eyes Blurred vision?: No Double vision?: No  Ears/Nose/Throat Sore throat?: No Sinus problems?: No  Hematologic/Lymphatic Swollen glands?: No Easy bruising?: No  Cardiovascular Leg swelling?: No Chest pain?: No  Respiratory Cough?: No Shortness of breath?: No  Endocrine Excessive thirst?: No  Musculoskeletal Back pain?: No Joint pain?: No  Neurological Headaches?: No Dizziness?: No  Psychologic Depression?: No Anxiety?: No  Physical Exam: BP 125/82   Pulse 87   Ht 5\' 10"  (1.778 m)   Wt 262 lb (118.8 kg)   BMI 37.59 kg/m   Constitutional: Well nourished. Alert and oriented, No acute distress. HEENT:  [name], moist mucus membranes. Trachea midline, no masses. Cardiovascular: No clubbing, cyanosis, or edema. Respiratory: Normal respiratory effort, no increased work of breathing. GI: Abdomen is soft, non tender, non distended, no abdominal masses. Liver and spleen not palpable.  No hernias appreciated.  Stool sample for occult testing is not indicated.   GU: No CVA tenderness.  No bladder fullness or masses.  Patient with uncircumcised phallus.  Foreskin easily retracted  Urethral meatus is patent.  No penile discharge. No penile lesions or rashes. Scrotum without lesions, cysts, rashes and/or edema.  Testicles are  located scrotally bilaterally. No masses are appreciated in the testicles. Left and right epididymis are normal. Rectal: Patient with  normal sphincter tone. Anus and perineum without scarring or rashes. No rectal masses are appreciated. Prostate is approximately 50 grams, no nodules are appreciated. Seminal vesicles are normal. Skin: No rashes, bruises or suspicious lesions. Lymph: No cervical or inguinal adenopathy. Neurologic: Grossly intact, no focal deficits, moving all 4 extremities. Psychiatric: Normal mood and affect.  Laboratory Data: Lab Results  Component Value Date   WBC 8.6 12/20/2016   HGB 15.2 02/22/2012   HCT 48.1 12/20/2016   MCV 88 12/20/2016   PLT 248 12/20/2016    Lab Results  Component Value Date   CREATININE 1.14 12/20/2016    Lab Results  Component Value Date   HGBA1C 9.5 (H) 10/04/2016    Lab Results  Component Value Date   TSH 2.000 12/20/2016       Component Value Date/Time   CHOL 115 12/20/2016 1620   CHOL 106 01/19/2016 0846   HDL 32 (L) 12/20/2016 1620   CHOLHDL 3.6 12/20/2016 1620   VLDL 23 01/19/2016 0846   LDLCALC 37 12/20/2016 1620    Lab Results  Component Value Date   AST 18 12/20/2016   Lab Results  Component Value Date   ALT 18 12/20/2016    Assessment & Plan:    1. Erectile dysfunction  - refilled Trimix  -  RTC in one year for SHIM and exam  2. BPH   - Continue conservative management, avoiding bladder irritants and timed voiding's  - RTC in 12 months for IPSS, PSA and exam    Return in about 1 year (around 12/31/2017) for IPSS, SHIM, PSA and exam.  These notes generated with voice recognition software. I apologize for typographical errors.  Michiel Cowboy, PA-C  [facility] [address] [place], [place] [address] [phone]

## 2017-03-02 NOTE — Progress Notes (Signed)
Cardiology Office Note  Date:  03/03/2017   ID:  [name] [birth year], MRN [id number]  PCP:  [place], MD   Chief Complaint  Patient presents with  . OTHER    1 yr f/u no complaints today. Meds reviewed verbally with pt.    HPI:  [name] is a 66 y/o male with h/o  HTN,  DM2  hemoglobin A1c 7.7 hyperlipidemia   April 2011  atrial fibrillation with RVR,  emergency room on April 15 2010 for rapid atrial fibrillation,  s/p cardioversion   lower extremity edema On calcium channel blockers,    Echo in 02/2010 shows EF 40-45%,  possibly tachycardia induced  Converted back to atrial fibrillation in 2016, at that time did not want cardioversion  he presents for routine followup for chronic atrial fibrillation.  In follow-up today he reports that he stopped anticoagulation on his own, started aspirin He felt the xarelto was too expensive He is requesting prescription for aspirin  He felt aspirin would do the same thing    feels well,  still dancing on the weekends, active Continues to work, delivers auto parts.   no edema, weight stable  Weight is stable. Lipid panel within goal No other new symptoms  Lab work reviewed with him in detail Hemoglobin A1c down to 7.6, previously 8.1  EKG personally reviewed by myself on todays visit Shows atrial fibrillation with ventricular rate 102 bpm no significant ST or T-wave changes  Other past medical history reviewed  Echo in 02/2010 shows EF 40-45% which was felt to be tachycardia induced.  Prior lab work LDL 110, hemoglobin A1c 9.1, HDL 36, total cholesterol 409     PMH:   has a past medical history of Acute systolic heart failure (HCC); Atrial fibrillation (HCC); Chronic low back pain; Diabetes mellitus without complication (HCC); DVT of lower limb, acute (HCC) (2014); Erectile dysfunction; HLD (hyperlipidemia); Hypertension; and Hypogonadism in male.  PSH:    Past Surgical History:  Procedure Laterality Date  .  COLONOSCOPY WITH PROPOFOL N/A 05/28/2016   Procedure: COLONOSCOPY WITH PROPOFOL;  Surgeon: Midge Minium, MD;  Location: [facility];  Service: Endoscopy;  Laterality: N/A;  diabetic - oral meds  . DVT, leg Left   . INSERTION OF VENA CAVA FILTER  2014    Current Outpatient Prescriptions  Medication Sig Dispense Refill  . amLODipine (NORVASC) 10 MG tablet take 1 tablet by mouth once daily 90 tablet 4  . atorvastatin (LIPITOR) 10 MG tablet take 1 tablet by mouth once daily 90 tablet 4  . fluticasone (FLONASE) 50 MCG/ACT nasal spray Place 2 sprays into both nostrils daily. 16 g 11  . lisinopril (PRINIVIL,ZESTRIL) 20 MG tablet Take 1 tablet (20 mg total) by mouth daily. 90 tablet 4  . metFORMIN (GLUCOPHAGE) 1000 MG tablet Take 1 tablet (1,000 mg total) by mouth 2 (two) times daily with a meal. 180 tablet 4  . metoprolol (LOPRESSOR) 50 MG tablet take 1 tablet by mouth twice a day 180 tablet 4  . RA ASPIRIN EC 81 MG EC tablet take 1 tablet by mouth once daily 90 tablet 3   No current facility-administered medications for this visit.      Allergies:   Patient has no known allergies.   Social History:  The patient  reports that he has never smoked. He has never used smokeless tobacco. He reports that he does not drink alcohol or use drugs.   Family History:   family history  includes Diabetes in his father; Heart failure in his father.    Review of Systems: Review of Systems  Constitutional: Negative.   Respiratory: Negative.   Cardiovascular: Negative.   Gastrointestinal: Negative.   Musculoskeletal: Negative.   Neurological: Negative.   Psychiatric/Behavioral: Negative.   All other systems reviewed and are negative.    PHYSICAL EXAM: VS:  BP 120/70 (BP Location: Left Arm, Patient Position: Sitting, Cuff Size: Normal)   Pulse 90   Ht  (1.778 m)   Wt 264 lb 8 oz (120 kg)   BMI 37.95 kg/m  , BMI Body mass index is 37.95 kg/m. GEN: Well nourished, well developed, in no  acute distress , obese HEENT: normal  Neck: no JVD, carotid bruits, or masses Cardiac: RRR; no murmurs, rubs, or gallops,no edema  Respiratory:  clear to auscultation bilaterally, normal work of breathing GI: soft, nontender, nondistended, + BS MS: no deformity or atrophy  Skin: warm and dry, no rash Neuro:  Strength and sensation are intact Psych: euthymic mood, full affect    Recent Labs: 12/20/2016: ALT 18; BUN 33; Creatinine, Ser 1.14; Platelets 248; Potassium 5.0; Sodium 145; TSH 2.000    Lipid Panel Lab Results  Component Value Date   CHOL 115 12/20/2016   HDL 32 (L) 12/20/2016   LDLCALC 37 12/20/2016   TRIG 229 (H) 12/20/2016      Wt Readings from Last 3 Encounters:  03/03/17 264 lb 8 oz (120 kg)  12/31/16 262 lb (118.8 kg)  12/28/16 268 lb (121.6 kg)       ASSESSMENT AND PLAN:  Congestive dilated cardiomyopathy (HCC) - Plan: EKG 12-Lead 40-45% ejection fraction in 2011  Appears relatively euvolemic , not on diuretics   Chronic atrial fibrillation (HCC) - Plan: EKG 12-Lead Long discussion with him concerning his anticoagulation On his own he stopped xarelto and started aspirin Discussed the risk and benefit Recommended he asked for patient assistance and go back on xarelto  HYPERTENSION, BENIGN - Plan: EKG 12-Lead Blood pressure is well controlled on today's visit. No changes made to the medications.  Mixed hyperlipidemia - Plan: EKG 12-Lead Cholesterol is at goal on the current lipid regimen. No changes to the medications were made.  Type 2 diabetes mellitus with other circulatory complication, without long-term current use of insulin (HCC) - Plan: EKG 12-Lead We have encouraged continued exercise, careful diet management in an effort to lose weight.  hemoglobin A1c trending downward   Encounter for anticoagulation discussion and counseling Long discussion with him concerning risk and benefit of anticoagulation He stopped anticoagulation on his own  secondary to financial reasons Suggested he reconsider, ask for assistance from the company, xarelto. We'll also try eliquis if the former does not work   Total encounter time more than 25 minutes  Greater than 50% was spent in counseling and coordination of care with the patient   Disposition:   F/U  6 months   Orders Placed This Encounter  Procedures  . EKG 12-Lead     Signed, Dossie Arbour, M.D., Ph.D. 03/03/2017  [facility] [place], [place] [phone]

## 2017-03-03 ENCOUNTER — Ambulatory Visit (INDEPENDENT_AMBULATORY_CARE_PROVIDER_SITE_OTHER): Payer: Medicare HMO | Admitting: Cardiovascular Disease

## 2017-03-03 ENCOUNTER — Encounter: Payer: Self-pay | Admitting: Cardiovascular Disease

## 2017-03-03 VITALS — BP 120/70 | HR 90 | Ht 70.0 in | Wt 264.5 lb

## 2017-03-03 DIAGNOSIS — I482 Chronic atrial fibrillation, unspecified: Secondary | ICD-10-CM

## 2017-03-03 DIAGNOSIS — E1159 Type 2 diabetes mellitus with other circulatory complications: Secondary | ICD-10-CM

## 2017-03-03 DIAGNOSIS — E782 Mixed hyperlipidemia: Secondary | ICD-10-CM

## 2017-03-03 DIAGNOSIS — I10 Essential (primary) hypertension: Secondary | ICD-10-CM

## 2017-03-03 DIAGNOSIS — I420 Dilated cardiomyopathy: Secondary | ICD-10-CM | POA: Diagnosis not present

## 2017-03-03 NOTE — Patient Instructions (Signed)
Medication Instructions:   Consider changing asa to xarelto 20 mg one a day Or eliquis 5 mg twice a day  Labwork:  No new labs needed  Testing/Procedures:  No further testing at this time   I recommend watching educational videos on topics of interest to you at:       www.goemmi.com  Enter code: HEARTCARE    Follow-Up: It was a pleasure seeing you in the office today. Please call us if you have new issues that need to be addressed before your next appt.  [phone]  Your physician wants you to follow-up in: 6 months.  You will receive a reminder letter in the mail two months in advance. If you don't receive a letter, please call our office to schedule the follow-up appointment.  If you need a refill on your cardiac medications before your next appointment, please call your pharmacy.

## 2017-03-14 ENCOUNTER — Ambulatory Visit: Payer: Medicare HMO | Admitting: Family Medicine

## 2017-04-14 ENCOUNTER — Ambulatory Visit: Payer: Medicare HMO | Admitting: Family Medicine

## 2017-06-02 ENCOUNTER — Ambulatory Visit: Payer: Medicare HMO | Admitting: Family Medicine

## 2017-06-09 ENCOUNTER — Telehealth: Payer: Self-pay

## 2017-06-09 NOTE — Telephone Encounter (Signed)
Pt called requesting a refill of trimix. Pt requested to have the 5mL vial instead of syringes. Medication was called into [facility] for pt.

## 2017-06-23 DIAGNOSIS — E109 Type 1 diabetes mellitus without complications: Secondary | ICD-10-CM | POA: Diagnosis not present

## 2017-06-23 DIAGNOSIS — I10 Essential (primary) hypertension: Secondary | ICD-10-CM | POA: Diagnosis not present

## 2017-06-23 DIAGNOSIS — Z0100 Encounter for examination of eyes and vision without abnormal findings: Secondary | ICD-10-CM | POA: Diagnosis not present

## 2017-06-23 DIAGNOSIS — H524 Presbyopia: Secondary | ICD-10-CM | POA: Diagnosis not present

## 2017-06-30 ENCOUNTER — Ambulatory Visit: Payer: Medicare HMO | Admitting: Family Medicine

## 2017-08-18 ENCOUNTER — Ambulatory Visit: Payer: Medicare HMO | Admitting: Family Medicine

## 2017-08-24 ENCOUNTER — Telehealth: Payer: Self-pay | Admitting: Cardiovascular Disease

## 2017-08-24 NOTE — Telephone Encounter (Signed)
Pt called, states his march 2017 bill was filed to [employer] by mistake, and should have been filed to [employer]. I contated the billing department with [facility] [phone]) and spoke with Jaynie CollinsLamont and he is going to re submit the march 2017 visit to [employer]. Pt informed and verbal understanding

## 2017-08-31 ENCOUNTER — Ambulatory Visit: Payer: Self-pay | Admitting: *Deleted

## 2017-08-31 MED ORDER — AZITHROMYCIN 250 MG PO TABS: ORAL_TABLET | ORAL | 0 refills | Status: DC

## 2017-08-31 NOTE — Telephone Encounter (Signed)
Patient is calling in to request a "sulfa" medication for his sinus problem. Patient states his sinus's are acting up and he needs something to dry them up. He states that Dr Dossie Arbourrissman usually treats him for this. He does have an appointment scheduled for October 31 for A1C follow up. He does not know th specific name of the medication he is requesting- but he did mention a Z-pack and something else that he used for 10 days.  I told him I would send the message to his provider and that he would be informed if something was called to the pharmacy- or if he needed to be seen for his sinus problem. [facility] uses [facility]. Contact # is [phone]

## 2017-08-31 NOTE — Telephone Encounter (Signed)
Rx sent to pharmacy. Pt aware.  

## 2017-08-31 NOTE — Telephone Encounter (Signed)
rx sent

## 2017-08-31 NOTE — Addendum Note (Signed)
Addended by: Vonita MossRISSMAN, Nicolette Gieske A on: 08/31/2017 11:54 AM   Modules accepted: Orders

## 2017-09-07 ENCOUNTER — Encounter: Payer: Self-pay | Admitting: Family Medicine

## 2017-09-07 ENCOUNTER — Ambulatory Visit (INDEPENDENT_AMBULATORY_CARE_PROVIDER_SITE_OTHER): Payer: Medicare HMO | Admitting: Family Medicine

## 2017-09-07 VITALS — BP 124/84 | HR 72 | Wt 256.0 lb

## 2017-09-07 DIAGNOSIS — E782 Mixed hyperlipidemia: Secondary | ICD-10-CM | POA: Diagnosis not present

## 2017-09-07 DIAGNOSIS — I10 Essential (primary) hypertension: Secondary | ICD-10-CM

## 2017-09-07 DIAGNOSIS — E1159 Type 2 diabetes mellitus with other circulatory complications: Secondary | ICD-10-CM

## 2017-09-07 DIAGNOSIS — Z23 Encounter for immunization: Secondary | ICD-10-CM | POA: Diagnosis not present

## 2017-09-07 NOTE — Assessment & Plan Note (Signed)
The current medical regimen is effective;  continue present plan and medications.  

## 2017-09-07 NOTE — Progress Notes (Signed)
BP 124/84   Pulse 72   Wt 256 lb (116.1 kg)   SpO2 99%   BMI 36.73 kg/m    Subjective:    Patient ID: Jeff Lynched Allen Balis, male    DOB: Mar 29, 1951, 66 y.o.   MRN: [id number]  HPI: Jeff [name] is a 66 y.o. male  Chief Complaint  Patient presents with  . Follow-up  . Diabetes   Patient all in all doing well has lost 8 pounds since last visit. No low blood sugar spells taking medications faithfully with good control of blood pressure cholesterol and apparently diabetes.  Relevant past medical, surgical, family and social history reviewed and updated as indicated. Interim medical history since our last visit reviewed. Allergies and medications reviewed and updated.  Review of Systems  Constitutional: Negative.   Respiratory: Negative.   Cardiovascular: Negative.     Per HPI unless specifically indicated above     Objective:    BP 124/84   Pulse 72   Wt 256 lb (116.1 kg)   SpO2 99%   BMI 36.73 kg/m   Wt Readings from Last 3 Encounters:  09/07/17 256 lb (116.1 kg)  03/03/17 264 lb 8 oz (120 kg)  12/31/16 262 lb (118.8 kg)    Physical Exam  Constitutional: He is oriented to person, place, and time. He appears well-developed and well-nourished.  HENT:  Head: Normocephalic and atraumatic.  Eyes: Conjunctivae and EOM are normal.  Neck: Normal range of motion.  Cardiovascular: Normal rate, regular rhythm and normal heart sounds.   Pulmonary/Chest: Effort normal and breath sounds normal.  Musculoskeletal: Normal range of motion.  Neurological: He is alert and oriented to person, place, and time.  Skin: No erythema.  Psychiatric: He has a normal mood and affect. His behavior is normal. Judgment and thought content normal.    Results for orders placed or performed in visit on 12/20/16  CBC with Differential/Platelet  Result Value Ref Range   WBC 8.6 3.4 - 10.8 x10E3/uL   RBC 5.48 4.14 - 5.80 x10E6/uL   Hemoglobin 16.4 13.0 - 17.7 g/dL   Hematocrit 40.948.1 81.137.5 -  51.0 %   MCV 88 79 - 97 fL   MCH 29.9 26.6 - 33.0 pg   MCHC 34.1 31.5 - 35.7 g/dL   RDW 91.414.1 78.212.3 - 95.615.4 %   Platelets 248 150 - 379 x10E3/uL   Neutrophils 68 Not Estab. %   Lymphs 20 Not Estab. %   Monocytes 9 Not Estab. %   Eos 2 Not Estab. %   Basos 1 Not Estab. %   Neutrophils Absolute 5.9 1.4 - 7.0 x10E3/uL   Lymphocytes Absolute 1.7 0.7 - 3.1 x10E3/uL   Monocytes Absolute 0.7 0.1 - 0.9 x10E3/uL   EOS (ABSOLUTE) 0.1 0.0 - 0.4 x10E3/uL   Basophils Absolute 0.0 0.0 - 0.2 x10E3/uL   Immature Granulocytes 0 Not Estab. %   Immature Grans (Abs) 0.0 0.0 - 0.1 x10E3/uL  Comprehensive metabolic panel  Result Value Ref Range   Glucose 120 (H) 65 - 99 mg/dL   BUN 33 (H) 8 - 27 mg/dL   Creatinine, Ser 2.131.14 0.76 - 1.27 mg/dL   GFR calc non Af Amer 67 >59 mL/min/1.73   GFR calc Af Amer 78 >59 mL/min/1.73   BUN/Creatinine Ratio 29 (H) 10 - 24   Sodium 145 (H) 134 - 144 mmol/L   Potassium 5.0 3.5 - 5.2 mmol/L   Chloride 101 96 - 106 mmol/L   CO2 25 18 -  29 mmol/L   Calcium 9.7 8.6 - 10.2 mg/dL   Total Protein 6.8 6.0 - 8.5 g/dL   Albumin 4.6 3.6 - 4.8 g/dL   Globulin, Total 2.2 1.5 - 4.5 g/dL   Albumin/Globulin Ratio 2.1 1.2 - 2.2   Bilirubin Total 0.8 0.0 - 1.2 mg/dL   Alkaline Phosphatase 75 39 - 117 IU/L   AST 18 0 - 40 IU/L   ALT 18 0 - 44 IU/L  Lipid panel  Result Value Ref Range   Cholesterol, Total 115 100 - 199 mg/dL   Triglycerides 161 (H) 0 - 149 mg/dL   HDL 32 (L) >09 mg/dL   VLDL Cholesterol Cal 46 (H) 5 - 40 mg/dL   LDL Calculated 37 0 - 99 mg/dL   Chol/HDL Ratio 3.6 0.0 - 5.0 ratio units  PSA  Result Value Ref Range   Prostate Specific Ag, Serum 1.8 0.0 - 4.0 ng/mL  TSH  Result Value Ref Range   TSH 2.000 0.450 - 4.500 uIU/mL  Urinalysis, Routine w reflex microscopic  Result Value Ref Range   Specific Gravity, UA 1.025 1.005 - 1.030   pH, UA 5.5 5.0 - 7.5   Color, UA Yellow Yellow   Appearance Ur Clear Clear   Leukocytes, UA Negative Negative   Protein,  UA Trace (A) Negative/Trace   Glucose, UA Trace (A) Negative   Ketones, UA Negative Negative   RBC, UA Negative Negative   Bilirubin, UA Negative Negative   Urobilinogen, Ur 1.0 0.2 - 1.0 mg/dL   Nitrite, UA Negative Negative  Bayer DCA Hb A1c Waived  Result Value Ref Range   Bayer DCA Hb A1c Waived 7.3 (H) <7.0 %      Assessment & Plan:   Problem List Items Addressed This Visit      Cardiovascular and Mediastinum   HYPERTENSION, BENIGN    The current medical regimen is effective;  continue present plan and medications.       Relevant Orders   Bayer DCA Hb A1c Waived     Endocrine   Diabetes (HCC)    The current medical regimen is effective;  continue present plan and medications.       Relevant Orders   Bayer DCA Hb A1c Waived     Other   Hyperlipidemia    The current medical regimen is effective;  continue present plan and medications.        Other Visit Diagnoses    Needs flu shot    -  Primary   Relevant Orders   Flu vaccine HIGH DOSE PF (Fluzone High dose) (Completed)       Follow up plan: Return in about 3 months (around 12/08/2017) for Hemoglobin A1c.

## 2017-09-08 LAB — BAYER DCA HB A1C WAIVED: HB A1C: 7.1 % — AB (ref <7.0)

## 2017-11-09 ENCOUNTER — Telehealth: Payer: Self-pay | Admitting: Family Medicine

## 2017-11-09 NOTE — Telephone Encounter (Signed)
Copied from CRM [phone]#[id number]. Topic: Quick Communication - Rx Refill/Question >> Nov 09, 2017 11:23 AM Landry MellowFoltz, Melissa J wrote: Has the patient contacted their pharmacy? No.   (Agent: If no, request that the patient contact the pharmacy for the refill.)   Preferred Pharmacy (with phone number or street name): pt is feeling like he is having a sinus infection - would [facility] [place]  Cb # is [phone]   Agent: Please be advised that RX refills may take up to 3 business days. We ask that you follow-up with your pharmacy.

## 2017-11-09 NOTE — Telephone Encounter (Signed)
Needs appt

## 2017-11-11 ENCOUNTER — Ambulatory Visit (INDEPENDENT_AMBULATORY_CARE_PROVIDER_SITE_OTHER): Payer: Medicare HMO | Admitting: Family Medicine

## 2017-11-11 ENCOUNTER — Telehealth: Payer: Self-pay | Admitting: Family Medicine

## 2017-11-11 ENCOUNTER — Encounter: Payer: Self-pay | Admitting: Family Medicine

## 2017-11-11 VITALS — BP 127/88 | HR 71 | Temp 97.5°F | Wt 278.1 lb

## 2017-11-11 DIAGNOSIS — R0981 Nasal congestion: Secondary | ICD-10-CM

## 2017-11-11 MED ORDER — HYDROCOD POLST-CPM POLST ER 10-8 MG/5ML PO SUER: 5.0000 mL | Freq: Every evening | ORAL | 0 refills | Status: DC | PRN

## 2017-11-11 MED ORDER — PREDNISONE 50 MG PO TABS: 50.0000 mg | ORAL_TABLET | Freq: Every day | ORAL | 0 refills | Status: DC

## 2017-11-11 NOTE — Telephone Encounter (Signed)
Routing to provider  

## 2017-11-11 NOTE — Progress Notes (Signed)
BP 127/88 (BP Location: Left Arm, Patient Position: Sitting, Cuff Size: Large)   Pulse 71   Temp (!) 97.5 F (36.4 C)   Wt 278 lb 2 oz (126.2 kg)   BMI 39.91 kg/m    Subjective:    Patient ID: Jeff [name], male    DOB: 1951/10/06, 67 y.o.   MRN: [id number]  HPI: Jeff [name] is a 67 y.o. male  Chief Complaint  Patient presents with  . URI    X 1 week   UPPER RESPIRATORY TRACT INFECTION Duration: 5 days Worst symptom: congestion Fever: no Cough: yes Shortness of breath: no Wheezing: no Chest pain: no Chest tightness: no Chest congestion: no Nasal congestion: yes Runny nose: yes Post nasal drip: yes Sneezing: yes Sore throat: no Swollen glands: no Sinus pressure: no Headache: no Face pain: no Toothache: no Ear pain: no  Ear pressure: no  Eyes red/itching:no Eye drainage/crusting: no  Vomiting: no Rash: no Fatigue: yes Sick contacts: no Strep contacts: no  Context: stable Recurrent sinusitis: yes Relief with OTC cold/cough medications: no  Treatments attempted: flonase and cough syrup   Relevant past medical, surgical, family and social history reviewed and updated as indicated. Interim medical history since our last visit reviewed. Allergies and medications reviewed and updated.  Review of Systems  Constitutional: Negative.   HENT: Positive for congestion, postnasal drip, rhinorrhea and sneezing. Negative for dental problem, drooling, ear discharge, ear pain, facial swelling, hearing loss, mouth sores, nosebleeds, sinus pressure, sinus pain, sore throat, tinnitus, trouble swallowing and voice change.   Respiratory: Negative.   Psychiatric/Behavioral: Negative.     Per HPI unless specifically indicated above     Objective:    BP 127/88 (BP Location: Left Arm, Patient Position: Sitting, Cuff Size: Large)   Pulse 71   Temp (!) 97.5 F (36.4 C)   Wt 278 lb 2 oz (126.2 kg)   BMI 39.91 kg/m   Wt Readings from Last 3 Encounters:  11/11/17  278 lb 2 oz (126.2 kg)  09/07/17 256 lb (116.1 kg)  03/03/17 264 lb 8 oz (120 kg)    Physical Exam  Constitutional: He is oriented to person, place, and time. He appears well-developed and well-nourished. No distress.  HENT:  Head: Normocephalic and atraumatic.  Right Ear: Hearing and external ear normal.  Left Ear: Hearing and external ear normal.  Nose: Nose normal.  Mouth/Throat: Oropharynx is clear and moist. No oropharyngeal exudate.  Eyes: Conjunctivae, EOM and lids are normal. Pupils are equal, round, and reactive to light. Right eye exhibits no discharge. Left eye exhibits no discharge. No scleral icterus.  Cardiovascular: Normal rate, regular rhythm, normal heart sounds and intact distal pulses. Exam reveals no gallop and no friction rub.  No murmur heard. Pulmonary/Chest: Effort normal and breath sounds normal. No respiratory distress. He has no wheezes. He has no rales. He exhibits no tenderness.  Musculoskeletal: Normal range of motion.  Neurological: He is alert and oriented to person, place, and time.  Skin: Skin is warm, dry and intact. No rash noted. He is not diaphoretic. No erythema. No pallor.  Psychiatric: He has a normal mood and affect. His speech is normal and behavior is normal. Judgment and thought content normal. Cognition and memory are normal.  Nursing note and vitals reviewed.   Results for orders placed or performed in visit on 09/07/17  Bayer DCA Hb A1c Waived  Result Value Ref Range   Bayer DCA Hb A1c Waived 7.1 (H) <  7.0 %      Assessment & Plan:   Problem List Items Addressed This Visit    None    Visit Diagnoses    Nasal congestion    -  Primary   No indication of bacterial infection. Will treat congestion with prednisone. Call if not getting better or getting worse. Tussionex for comfort.        Follow up plan: Return if symptoms worsen or fail to improve.

## 2017-11-11 NOTE — Telephone Encounter (Signed)
Patient seen today

## 2017-11-11 NOTE — Telephone Encounter (Signed)
Patient is requesting a Z Pack for a sinus infection.  He states Dr Dossie Arbourrissman normally is able to call this in for him.  [facility]  Thank You

## 2017-11-15 ENCOUNTER — Telehealth: Payer: Self-pay | Admitting: Family Medicine

## 2017-11-15 MED ORDER — AZITHROMYCIN 250 MG PO TABS: ORAL_TABLET | ORAL | 0 refills | Status: DC

## 2017-11-15 NOTE — Telephone Encounter (Signed)
Copied from CRM [phone]#[id number]. Topic: Quick Communication - See Telephone Encounter >> Nov 15, 2017 11:30 AM Clack, Princella PellegriniJessica D wrote: CRM for notification. See Telephone encounter for: Pt states he is not feeling any better and would like for an [id number] [place], [place] - [address] [phone] (Phone) [phone] (Fax)    11/15/17.

## 2017-11-15 NOTE — Telephone Encounter (Signed)
Patient was seen by Dr. Laural BenesJohnson on 11/11/17

## 2017-11-15 NOTE — Telephone Encounter (Signed)
Rx sent to his pharmacy

## 2017-11-18 ENCOUNTER — Ambulatory Visit (INDEPENDENT_AMBULATORY_CARE_PROVIDER_SITE_OTHER): Payer: Medicare HMO | Admitting: Family Medicine

## 2017-11-18 ENCOUNTER — Encounter: Payer: Self-pay | Admitting: Family Medicine

## 2017-11-18 VITALS — BP 104/69 | HR 98 | Temp 97.4°F | Wt 266.7 lb

## 2017-11-18 DIAGNOSIS — J069 Acute upper respiratory infection, unspecified: Secondary | ICD-10-CM

## 2017-11-18 MED ORDER — HYDROCOD POLST-CPM POLST ER 10-8 MG/5ML PO SUER: 5.0000 mL | Freq: Every evening | ORAL | 0 refills | Status: DC | PRN

## 2017-11-18 MED ORDER — GUAIFENESIN ER 600 MG PO TB12: 600.0000 mg | ORAL_TABLET | Freq: Two times a day (BID) | ORAL | 0 refills | Status: DC

## 2017-11-18 MED ORDER — LEVOFLOXACIN 750 MG PO TABS
750.0000 mg | ORAL_TABLET | Freq: Every day | ORAL | 0 refills | Status: DC
Start: 2017-11-18 — End: 2017-11-23

## 2017-11-18 NOTE — Progress Notes (Signed)
BP 104/69 (BP Location: Left Arm, Patient Position: Sitting, Cuff Size: Normal)   Pulse 98   Temp (!) 97.4 F (36.3 C) (Oral)   Wt 266 lb 11.2 oz (121 kg)   SpO2 94%   BMI 38.27 kg/m    Subjective:    Patient ID: Jeff [name], male    DOB: 09-14-1951, 67 y.o.   MRN: [id number]  HPI: Jeff [name] is a 67 y.o. male  Chief Complaint  Patient presents with  . Cough    Patient was seen 11/11/17. Patient states he is no better. Symptoms are still the same and haven't improved. 1 day left on antibiotic, finished prednisone.  . Nasal Congestion   Weakness, fatigue, lightheaded, congestion, facial pain and pressure, productive cough.  Was seen 11/11/17 and given prednisone and zpack, completed all but 1 day of zpack and no improvement, possibly worsening. Denies fever, chills, CP, SOB. Has not been trying any home remedies.   Relevant past medical, surgical, family and social history reviewed and updated as indicated. Interim medical history since our last visit reviewed. Allergies and medications reviewed and updated.  Review of Systems  Constitutional: Positive for fatigue.  HENT: Positive for congestion, sinus pressure and sinus pain.   Respiratory: Positive for cough.   Cardiovascular: Negative.   Gastrointestinal: Negative.   Genitourinary: Negative.   Musculoskeletal: Negative.   Neurological: Positive for weakness and light-headedness.  Psychiatric/Behavioral: Negative.    Per HPI unless specifically indicated above     Objective:    BP 104/69 (BP Location: Left Arm, Patient Position: Sitting, Cuff Size: Normal)   Pulse 98   Temp (!) 97.4 F (36.3 C) (Oral)   Wt 266 lb 11.2 oz (121 kg)   SpO2 94%   BMI 38.27 kg/m   Wt Readings from Last 3 Encounters:  11/20/17 267 lb 14.4 oz (121.5 kg)  11/18/17 266 lb 11.2 oz (121 kg)  11/11/17 278 lb 2 oz (126.2 kg)    Physical Exam  Constitutional: He is oriented to person, place, and time. He appears well-developed and  well-nourished. No distress.  HENT:  Head: Atraumatic.  Right Ear: External ear normal.  Left Ear: External ear normal.  Mouth/Throat: No oropharyngeal exudate.  Nasal mucosa and oropharynx erythematous and edematous B/l sinus ttp  Eyes: Conjunctivae are normal. Pupils are equal, round, and reactive to light. No scleral icterus.  Neck: Normal range of motion. Neck supple.  Cardiovascular: Normal rate and normal heart sounds.  Pulmonary/Chest: Effort normal and breath sounds normal. No respiratory distress. He has no wheezes. He has no rales.  Musculoskeletal: Normal range of motion.  Lymphadenopathy:    He has no cervical adenopathy.  Neurological: He is alert and oriented to person, place, and time.  Skin: Skin is warm and dry.  Psychiatric: He has a normal mood and affect. His behavior is normal.  Nursing note and vitals reviewed.  Results for orders placed or performed in visit on 09/07/17  Bayer DCA Hb A1c Waived  Result Value Ref Range   Bayer DCA Hb A1c Waived 7.1 (H) <7.0 %      Assessment & Plan:   Problem List Items Addressed This Visit    None    Visit Diagnoses    Upper respiratory tract infection, unspecified type    -  Primary   Afebrile, lungs CTAB. Will tx with levaquin, plain mucinex, and more tussionex as cough is worst at nighttime. discussed supportive care. F/u if worsening  Follow up plan: Return if symptoms worsen or fail to improve.

## 2017-11-20 ENCOUNTER — Encounter: Payer: Self-pay | Admitting: Emergency Medicine

## 2017-11-20 ENCOUNTER — Emergency Department: Payer: Medicare HMO

## 2017-11-20 ENCOUNTER — Observation Stay
Admission: EM | Admit: 2017-11-20 | Discharge: 2017-11-23 | Disposition: A | Discharge status: Home / Self Care | Payer: Medicare HMO | Attending: Internal Medicine | Admitting: Internal Medicine

## 2017-11-20 ENCOUNTER — Other Ambulatory Visit: Payer: Self-pay

## 2017-11-20 DIAGNOSIS — Z7951 Long term (current) use of inhaled steroids: Secondary | ICD-10-CM | POA: Insufficient documentation

## 2017-11-20 DIAGNOSIS — Z79899 Other long term (current) drug therapy: Secondary | ICD-10-CM | POA: Diagnosis not present

## 2017-11-20 DIAGNOSIS — I509 Heart failure, unspecified: Secondary | ICD-10-CM | POA: Diagnosis not present

## 2017-11-20 DIAGNOSIS — I5022 Chronic systolic (congestive) heart failure: Secondary | ICD-10-CM | POA: Insufficient documentation

## 2017-11-20 DIAGNOSIS — R55 Syncope and collapse: Secondary | ICD-10-CM | POA: Diagnosis not present

## 2017-11-20 DIAGNOSIS — R0981 Nasal congestion: Secondary | ICD-10-CM | POA: Diagnosis not present

## 2017-11-20 DIAGNOSIS — N179 Acute kidney failure, unspecified: Secondary | ICD-10-CM | POA: Diagnosis present

## 2017-11-20 DIAGNOSIS — E119 Type 2 diabetes mellitus without complications: Secondary | ICD-10-CM | POA: Insufficient documentation

## 2017-11-20 DIAGNOSIS — I82412 Acute embolism and thrombosis of left femoral vein: Secondary | ICD-10-CM | POA: Diagnosis not present

## 2017-11-20 DIAGNOSIS — I110 Hypertensive heart disease with heart failure: Secondary | ICD-10-CM | POA: Diagnosis not present

## 2017-11-20 DIAGNOSIS — Z7984 Long term (current) use of oral hypoglycemic drugs: Secondary | ICD-10-CM | POA: Diagnosis not present

## 2017-11-20 DIAGNOSIS — I82432 Acute embolism and thrombosis of left popliteal vein: Secondary | ICD-10-CM | POA: Insufficient documentation

## 2017-11-20 DIAGNOSIS — I420 Dilated cardiomyopathy: Secondary | ICD-10-CM | POA: Diagnosis not present

## 2017-11-20 DIAGNOSIS — E861 Hypovolemia: Principal | ICD-10-CM | POA: Insufficient documentation

## 2017-11-20 DIAGNOSIS — N170 Acute kidney failure with tubular necrosis: Secondary | ICD-10-CM | POA: Insufficient documentation

## 2017-11-20 DIAGNOSIS — J019 Acute sinusitis, unspecified: Secondary | ICD-10-CM | POA: Insufficient documentation

## 2017-11-20 DIAGNOSIS — E871 Hypo-osmolality and hyponatremia: Secondary | ICD-10-CM | POA: Diagnosis not present

## 2017-11-20 DIAGNOSIS — Z86718 Personal history of other venous thrombosis and embolism: Secondary | ICD-10-CM | POA: Diagnosis not present

## 2017-11-20 DIAGNOSIS — Z7982 Long term (current) use of aspirin: Secondary | ICD-10-CM | POA: Insufficient documentation

## 2017-11-20 DIAGNOSIS — O2230 Deep phlebothrombosis in pregnancy, unspecified trimester: Secondary | ICD-10-CM

## 2017-11-20 DIAGNOSIS — E860 Dehydration: Secondary | ICD-10-CM

## 2017-11-20 DIAGNOSIS — E785 Hyperlipidemia, unspecified: Secondary | ICD-10-CM | POA: Insufficient documentation

## 2017-11-20 DIAGNOSIS — I959 Hypotension, unspecified: Secondary | ICD-10-CM | POA: Diagnosis not present

## 2017-11-20 DIAGNOSIS — M7989 Other specified soft tissue disorders: Secondary | ICD-10-CM

## 2017-11-20 DIAGNOSIS — I482 Chronic atrial fibrillation: Secondary | ICD-10-CM | POA: Insufficient documentation

## 2017-11-20 DIAGNOSIS — N289 Disorder of kidney and ureter, unspecified: Secondary | ICD-10-CM

## 2017-11-20 HISTORY — DX: Chronic systolic (congestive) heart failure: I50.22

## 2017-11-20 LAB — TROPONIN I

## 2017-11-20 LAB — URINALYSIS, COMPLETE (UACMP) WITH MICROSCOPIC
BACTERIA UA: NONE SEEN
Bilirubin Urine: NEGATIVE
HGB URINE DIPSTICK: NEGATIVE
KETONES UR: 5 mg/dL — AB
LEUKOCYTES UA: NEGATIVE
NITRITE: NEGATIVE
PROTEIN: NEGATIVE mg/dL
Specific Gravity, Urine: 1.023 (ref 1.005–1.030)
pH: 5 (ref 5.0–8.0)

## 2017-11-20 LAB — COMPREHENSIVE METABOLIC PANEL
ALT: 12 U/L — ABNORMAL LOW (ref 17–63)
ANION GAP: 13 (ref 5–15)
AST: 21 U/L (ref 15–41)
Albumin: 3.8 g/dL (ref 3.5–5.0)
Alkaline Phosphatase: 77 U/L (ref 38–126)
BILIRUBIN TOTAL: 1.9 mg/dL — AB (ref 0.3–1.2)
BUN: 37 mg/dL — AB (ref 6–20)
CO2: 20 mmol/L — ABNORMAL LOW (ref 22–32)
Calcium: 8.8 mg/dL — ABNORMAL LOW (ref 8.9–10.3)
Chloride: 100 mmol/L — ABNORMAL LOW (ref 101–111)
Creatinine, Ser: 2.06 mg/dL — ABNORMAL HIGH (ref 0.61–1.24)
GFR calc Af Amer: 37 mL/min — ABNORMAL LOW (ref >60)
GFR, EST NON AFRICAN AMERICAN: 32 mL/min — AB (ref >60)
Glucose, Bld: 288 mg/dL — ABNORMAL HIGH (ref 65–99)
POTASSIUM: 5 mmol/L (ref 3.5–5.1)
Sodium: 133 mmol/L — ABNORMAL LOW (ref 135–145)
TOTAL PROTEIN: 6.7 g/dL (ref 6.5–8.1)

## 2017-11-20 LAB — CBC
HEMATOCRIT: 49.1 % (ref 40.0–52.0)
HEMOGLOBIN: 16.2 g/dL (ref 13.0–18.0)
MCH: 29.6 pg (ref 26.0–34.0)
MCHC: 33 g/dL (ref 32.0–36.0)
MCV: 89.7 fL (ref 80.0–100.0)
Platelets: 143 10*3/uL — ABNORMAL LOW (ref 150–440)
RBC: 5.47 MIL/uL (ref 4.40–5.90)
RDW: 13.7 % (ref 11.5–14.5)
WBC: 14.7 10*3/uL — AB (ref 3.8–10.6)

## 2017-11-20 LAB — TSH: TSH: 2.638 u[IU]/mL (ref 0.350–4.500)

## 2017-11-20 LAB — GLUCOSE, CAPILLARY: Glucose-Capillary: 261 mg/dL — ABNORMAL HIGH (ref 65–99)

## 2017-11-20 MED ORDER — ASPIRIN EC 81 MG PO TBEC
81.0000 mg | DELAYED_RELEASE_TABLET | Freq: Every day | ORAL | Status: DC
  Administered 2017-11-21 – 2017-11-23 (×3): 81 mg via ORAL
  Filled 2017-11-20 (×3): qty 1

## 2017-11-20 MED ORDER — SODIUM CHLORIDE 0.9 % IV BOLUS (SEPSIS)
1000.0000 mL | Freq: Once | INTRAVENOUS | Status: AC
  Administered 2017-11-20: 1000 mL via INTRAVENOUS

## 2017-11-20 MED ORDER — LEVOFLOXACIN 250 MG PO TABS
250.0000 mg | ORAL_TABLET | Freq: Every day | ORAL | Status: DC
  Administered 2017-11-21: 250 mg via ORAL
  Filled 2017-11-20: qty 1

## 2017-11-20 MED ORDER — INSULIN ASPART 100 UNIT/ML ~~LOC~~ SOLN
0.0000 [IU] | Freq: Three times a day (TID) | SUBCUTANEOUS | Status: DC
Start: 2017-11-21 — End: 2017-11-23
  Administered 2017-11-21: 09:00:00 1 [IU] via SUBCUTANEOUS
  Administered 2017-11-21 (×2): 3 [IU] via SUBCUTANEOUS
  Administered 2017-11-22: 13:00:00 2 [IU] via SUBCUTANEOUS
  Administered 2017-11-22: 3 [IU] via SUBCUTANEOUS
  Administered 2017-11-22: 2 [IU] via SUBCUTANEOUS
  Administered 2017-11-23: 3 [IU] via SUBCUTANEOUS
  Administered 2017-11-23: 1 [IU] via SUBCUTANEOUS
  Filled 2017-11-20 (×8): qty 1

## 2017-11-20 MED ORDER — SODIUM CHLORIDE 0.9 % IV SOLN
INTRAVENOUS | Status: AC
  Administered 2017-11-20 – 2017-11-21 (×2): via INTRAVENOUS

## 2017-11-20 MED ORDER — FLUTICASONE PROPIONATE 50 MCG/ACT NA SUSP
2.0000 | Freq: Every day | NASAL | Status: DC
  Administered 2017-11-22 – 2017-11-23 (×2): 2 via NASAL
  Filled 2017-11-20: qty 16

## 2017-11-20 MED ORDER — ACETAMINOPHEN 325 MG PO TABS: 650.0000 mg | ORAL_TABLET | Freq: Four times a day (QID) | ORAL | Status: DC | PRN

## 2017-11-20 MED ORDER — ACETAMINOPHEN 650 MG RE SUPP: 650.0000 mg | Freq: Four times a day (QID) | RECTAL | Status: DC | PRN

## 2017-11-20 MED ORDER — INSULIN ASPART 100 UNIT/ML ~~LOC~~ SOLN
0.0000 [IU] | Freq: Every day | SUBCUTANEOUS | Status: DC
  Administered 2017-11-20 – 2017-11-21 (×2): 3 [IU] via SUBCUTANEOUS
  Administered 2017-11-22: 2 [IU] via SUBCUTANEOUS
  Filled 2017-11-20 (×3): qty 1

## 2017-11-20 MED ORDER — ONDANSETRON HCL 4 MG PO TABS: 4.0000 mg | ORAL_TABLET | Freq: Four times a day (QID) | ORAL | Status: DC | PRN

## 2017-11-20 MED ORDER — ENOXAPARIN SODIUM 40 MG/0.4ML ~~LOC~~ SOLN
40.0000 mg | SUBCUTANEOUS | Status: DC
  Administered 2017-11-20 – 2017-11-21 (×2): 40 mg via SUBCUTANEOUS
  Filled 2017-11-20 (×2): qty 0.4

## 2017-11-20 MED ORDER — ATORVASTATIN CALCIUM 10 MG PO TABS
10.0000 mg | ORAL_TABLET | Freq: Every day | ORAL | Status: DC
  Administered 2017-11-21 – 2017-11-23 (×3): 10 mg via ORAL
  Filled 2017-11-20 (×3): qty 1

## 2017-11-20 MED ORDER — ONDANSETRON HCL 4 MG/2ML IJ SOLN: 4.0000 mg | Freq: Four times a day (QID) | INTRAMUSCULAR | Status: DC | PRN

## 2017-11-20 NOTE — ED Triage Notes (Signed)
First Nurse Note:  Arrives with c/o weakness and not feeling well x 2 weeks.  Has been seen by PCP for same.  AAOx3.  Skin pale, warm and dry.

## 2017-11-20 NOTE — H&P (Signed)
[facility] [place] [facility]   PATIENT NAME: Jeff [name]    MR#:  [id number]  DATE OF BIRTH:  01/01/1951  DATE OF ADMISSION:  11/20/2017  PRIMARY CARE PHYSICIAN: Steele Sizerrissman, Mark A, MD   REQUESTING/REFERRING PHYSICIAN: Dr. Minna AntisKevin Paduchowski  CHIEF COMPLAINT:   Chief Complaint  Patient presents with  . Loss of Consciousness    HISTORY OF PRESENT ILLNESS:  Jeff Buttsed Bhavsar  is a 67 y.o. male with a known history of chronic atrial fibrillation not on anticoagulation, tachycardia induced cardiomyopathy with EF of 45%, history of DVT not on anticoagulation currently, hypertension, non-insulin-dependent diabetes mellitus, hyperlipidemia presents to hospital secondary to a syncopal episode. Symptoms started about 10 days ago with worsening cough, congestion and he was started on oral prednisone and Tussionex.  Since his symptoms did not improve, his 3 days ago.  Patient started to feel a little better but has been complaining of significant weakness and lack of appetite.  His oral intake has been decreased.  This morning he was walking to get a bowl of soup and he felt weak in his knees and slid to the floor and then passed out for a few seconds.  Noted to be hypotensive when he came to the emergency room.  Pressure slowly improving with IV fluids.  Labs also indicate acute renal failure, likely from dehydration.  Patient denies any fevers or chills at this time.  No nausea, abdominal pain, vomiting or diarrhea.  PAST MEDICAL HISTORY:   Past Medical History:  Diagnosis Date  . Atrial fibrillation (HCC)   . Chronic low back pain   . Chronic systolic heart failure (HCC)    with EF of 40-45% by echocardiogram, liekly tachycardia induced cariomyopathy  . Diabetes mellitus without complication (HCC)   . DVT of lower limb, acute (HCC) 2014   off anti-coagulation  . Erectile dysfunction   . HLD (hyperlipidemia)   . Hypertension   . Hypogonadism in male     PAST SURGICAL  HISTORY:   Past Surgical History:  Procedure Laterality Date  . COLONOSCOPY WITH PROPOFOL N/A 05/28/2016   Procedure: COLONOSCOPY WITH PROPOFOL;  Surgeon: Midge Miniumarren Wohl, MD;  [facility];  Service: Endoscopy;  Laterality: N/A;  diabetic - oral meds  . DVT, leg Left   . INSERTION OF VENA CAVA FILTER  2014    SOCIAL HISTORY:   Social History   Tobacco Use  . Smoking status: Never Smoker  . Smokeless tobacco: Never Used  Substance Use Topics  . Alcohol use: No    Alcohol/week: 0.0 oz    FAMILY HISTORY:   Family History  Problem Relation Age of Onset  . Lymphoma Mother   . Heart failure Father   . Diabetes Father   . Diabetes Mellitus II Unknown   . Heart attack Neg Hx   . Hypertension Neg Hx   . Cancer Neg Hx   . COPD Neg Hx   . Stroke Neg Hx   . Prostate cancer Neg Hx   . Kidney cancer Neg Hx   . Bladder Cancer Neg Hx     DRUG ALLERGIES:  No Known Allergies  REVIEW OF SYSTEMS:   Review of Systems  Constitutional: Negative for chills, fever, malaise/fatigue and weight loss.  HENT: Positive for congestion and sinus pain. Negative for ear discharge, ear pain, hearing loss, nosebleeds and tinnitus.   Eyes: Negative for blurred vision, double vision and photophobia.  Respiratory: Positive for cough. Negative for hemoptysis,  shortness of breath and wheezing.   Cardiovascular: Negative for chest pain, palpitations, orthopnea and leg swelling.  Gastrointestinal: Negative for abdominal pain, constipation, diarrhea, heartburn, melena, nausea and vomiting.  Genitourinary: Negative for dysuria, frequency and urgency.  Musculoskeletal: Positive for myalgias. Negative for back pain and neck pain.  Skin: Negative for rash.  Neurological: Positive for weakness. Negative for dizziness, tingling, tremors, sensory change, speech change, focal weakness and headaches.  Endo/Heme/Allergies: Does not bruise/bleed easily.  Psychiatric/Behavioral: Negative for depression.     MEDICATIONS AT HOME:   Prior to Admission medications   Medication Sig Start Date End Date Taking? Authorizing Provider  amLODipine (NORVASC) 10 MG tablet take 1 tablet by mouth once daily 12/16/16   Steele Sizer, MD  atorvastatin (LIPITOR) 10 MG tablet take 1 tablet by mouth once daily 12/16/16   Steele Sizer, MD  chlorpheniramine-HYDROcodone (TUSSIONEX PENNKINETIC ER) 10-8 MG/5ML SUER Take 5 mLs by mouth at bedtime as needed. 11/18/17   Particia Nearing, PA-C  fluticasone [facility]) 50 MCG/ACT nasal spray Place 2 sprays into both nostrils daily. 10/04/16   Particia Nearing, PA-C  guaiFENesin (MUCINEX) 600 MG 12 hr tablet Take 1 tablet (600 mg total) by mouth 2 (two) times daily. 11/18/17   Particia Nearing, PA-C  levofloxacin (LEVAQUIN) 750 MG tablet Take 1 tablet (750 mg total) by mouth daily. 11/18/17   Particia Nearing, PA-C  lisinopril (PRINIVIL,ZESTRIL) 20 MG tablet Take 1 tablet (20 mg total) by mouth daily. 11/18/16   Steele Sizer, MD  metFORMIN (GLUCOPHAGE) 1000 MG tablet Take 1 tablet (1,000 mg total) by mouth 2 (two) times daily with a meal. 12/20/16   Steele Sizer, MD  metoprolol (LOPRESSOR) 50 MG tablet take 1 tablet by mouth twice a day 12/16/16   Steele Sizer, MD  RA ASPIRIN EC 81 MG EC tablet take 1 tablet by mouth once daily 06/25/16   Olevia Perches P, DO      VITAL SIGNS:  Blood pressure (!) 74/59, pulse 96, temperature 98.5 F (36.9 C), temperature source Rectal, resp. rate 16, height 5\' 11"  (1.803 m), weight 117.9 kg (260 lb), SpO2 97 %.  PHYSICAL EXAMINATION:   Physical Exam  GENERAL:  67 y.o.-year-old patient lying in the bed with no acute distress.  EYES: Pupils equal, round, reactive to light and accommodation. No scleral icterus. Extraocular muscles intact.  HEENT: Head atraumatic, normocephalic. Oropharynx and nasopharynx clear. Some facial pain on exam, NECK:  Supple, no jugular venous distention. No thyroid enlargement, no  tenderness.  LUNGS: Normal breath sounds bilaterally, no wheezing, rales,rhonchi or crepitation. No use of accessory muscles of respiration.  CARDIOVASCULAR: S1, S2 normal. No murmurs, rubs, or gallops.  ABDOMEN: Soft, nontender, nondistended. Bowel sounds present. No organomegaly or mass.  EXTREMITIES: No pedal edema, cyanosis, or clubbing.  NEUROLOGIC: Cranial nerves II through XII are intact. Muscle strength 5/5 in all extremities. Sensation intact. Gait not checked.  PSYCHIATRIC: The patient is alert and oriented x 3.  SKIN: No obvious rash, lesion, or ulcer.   LABORATORY PANEL:   CBC Recent Labs  Lab 11/20/17 1552  WBC 14.7*  HGB 16.2  HCT 49.1  PLT 143*   ------------------------------------------------------------------------------------------------------------------  Chemistries  Recent Labs  Lab 11/20/17 1552  NA 133*  K 5.0  CL 100*  CO2 20*  GLUCOSE 288*  BUN 37*  CREATININE 2.06*  CALCIUM 8.8*  AST 21  ALT 12*  ALKPHOS 77  BILITOT 1.9*   ------------------------------------------------------------------------------------------------------------------  Cardiac Enzymes Recent Labs  Lab 11/20/17 1552  TROPONINI <0.03   ------------------------------------------------------------------------------------------------------------------  RADIOLOGY:  Dg Chest Portable 1 View  Result Date: 11/20/2017 CLINICAL DATA:  History of heart failure with increasing weakness last week. EXAM: PORTABLE CHEST 1 VIEW COMPARISON:  February 23, 2012 FINDINGS: The mediastinal contour is normal. The heart size is mildly enlarged. There is no focal infiltrate, pulmonary edema, or pleural effusion. The visualized skeletal structures are unremarkable. IMPRESSION: No active cardiopulmonary disease. Electronically Signed   By: Sherian Rein M.D.   On: 11/20/2017 16:22    EKG:   Orders placed or performed during the hospital encounter of 11/20/17  . ED EKG  . ED EKG  . EKG 12-Lead    . EKG 12-Lead    IMPRESSION AND PLAN:   Tremel Setters  is a 67 y.o. male with a known history of chronic atrial fibrillation not on anticoagulation, tachycardia induced cardiomyopathy with EF of 45%, history of DVT not on anticoagulation currently, hypertension, non-insulin-dependent diabetes mellitus, hyperlipidemia presents to hospital secondary to a syncopal episode.  1.  Syncope-secondary to hypovolemia and weakness. -IV fluids, orthostatic signs blood pressure is improved. -Hold blood pressure medications for now.  2.  Acute renal failure- due to pre renal causes, ATN - hold BP meds, IV fluids to avoid hypotension - monitor. Hold nephrotoxins - monitor urine output  3.  Acute sinusitis- no fevers, wbc elevated- but was on prednisone recently - continue levaquin- reduce the dose due to renal dysfunction  4. Hyponatremia- hypovolemic, monitor with fluids Also borderline hyperkalemia- monitor while off of lisinopril  5.  Hypertension- hold metoprolol, lisinopril and norvasc due to hypotension - IV fluids and monitor  6. Diabetes- hold metformin due to renal failure, IV fluids SSI   7.  Chronic atrial fibrillation-follows with cardiology.  Rate controlled at this time.  Hold metoprolol for now.  IV fluids On aspirin only for anticoagulation at this time.  8. DVT Prophylaxis- lovenox    All the records are reviewed and case discussed with ED provider. Management plans discussed with the patient, family and they are in agreement.  CODE STATUS: Full Code  TOTAL TIME TAKING CARE OF THIS PATIENT: 50 minutes.    Enid Baas M.D on 11/20/2017 at 5:20 PM  Between 7am to 6pm - Pager - [phone]  After 6pm go to [email or web address] - Social research officer, government  [facility]  Office  [phone]  CC: Primary care physician; Steele Sizer, MD

## 2017-11-20 NOTE — ED Provider Notes (Signed)
[facility] Emergency Department Provider Note  Time seen: 3:54 PM  I have reviewed the triage vital signs and the nursing notes.   HISTORY  Chief Complaint Loss of Consciousness    HPI Jeff [name] is a 67 y.o. male with a past medical history of CHF, atrial fibrillation (now off blood thinners), diabetes, hypertension, hyperlipidemia, presents to the emergency department if she is getting worse after syncopal episode.  According to the patient for the past 2 weeks he has not been feeling well which she describes as congestion, drainage, body aches.  Patient states he has been seen by his doctor twice in the past 2 weeks and has undergone 2 courses of antibiotics currently finishing a course of Levaquin.  Patient denies any fever at any point over the past 2 weeks.  States he has been feeling very weak over the past several days, states he was walking to get a bowl of soup today when he became extremely lightheaded, fell to his knees, brief syncopal episode.  Denies any chest pain or trouble breathing.  Denies leg pain or swelling.  Denies abdominal pain, vomiting or diarrhea.  Denies dysuria.  Past Medical History:  Diagnosis Date  . Acute systolic heart failure (HCC)    with EF of 40-45% by echocardiogram, liekly tachycardia induced cariomyopathy  . Atrial fibrillation (HCC)   . Chronic low back pain   . Diabetes mellitus without complication (HCC)   . DVT of lower limb, acute (HCC) 2014  . Erectile dysfunction   . HLD (hyperlipidemia)   . Hypertension   . Hypogonadism in male     Patient Active Problem List   Diagnosis Date Noted  . BPH (benign prostatic hyperplasia) 12/20/2016  . Special screening for malignant neoplasms, colon   . Erectile dysfunction 03/29/2015  . Hypogonadism in male 03/29/2015  . Hyperlipidemia 02/02/2013  . Diabetes (HCC) 02/02/2013  . EDEMA 06/03/2010  . HYPERTENSION, BENIGN 02/23/2010  . Congestive dilated cardiomyopathy  (HCC) 02/23/2010  . ATRIAL FIBRILLATION 02/20/2010    Past Surgical History:  Procedure Laterality Date  . COLONOSCOPY WITH PROPOFOL N/A 05/28/2016   Procedure: COLONOSCOPY WITH PROPOFOL;  Surgeon: Midge Miniumarren Wohl, MD;  [facility];  Service: Endoscopy;  Laterality: N/A;  diabetic - oral meds  . DVT, leg Left   . INSERTION OF VENA CAVA FILTER  2014    Prior to Admission medications   Medication Sig Start Date End Date Taking? Authorizing Provider  amLODipine (NORVASC) 10 MG tablet take 1 tablet by mouth once daily 12/16/16   Steele Sizerrissman, Mark A, MD  atorvastatin (LIPITOR) 10 MG tablet take 1 tablet by mouth once daily 12/16/16   Steele Sizerrissman, Mark A, MD  chlorpheniramine-HYDROcodone (TUSSIONEX PENNKINETIC ER) 10-8 MG/5ML SUER Take 5 mLs by mouth at bedtime as needed. 11/18/17   Particia NearingLane, Rachel Elizabeth, PA-C  fluticasone [facility](FLONASE) 50 MCG/ACT nasal spray Place 2 sprays into both nostrils daily. 10/04/16   Particia NearingLane, Rachel Elizabeth, PA-C  guaiFENesin (MUCINEX) 600 MG 12 hr tablet Take 1 tablet (600 mg total) by mouth 2 (two) times daily. 11/18/17   Particia NearingLane, Rachel Elizabeth, PA-C  levofloxacin (LEVAQUIN) 750 MG tablet Take 1 tablet (750 mg total) by mouth daily. 11/18/17   Particia NearingLane, Rachel Elizabeth, PA-C  lisinopril (PRINIVIL,ZESTRIL) 20 MG tablet Take 1 tablet (20 mg total) by mouth daily. 11/18/16   Steele Sizerrissman, Mark A, MD  metFORMIN (GLUCOPHAGE) 1000 MG tablet Take 1 tablet (1,000 mg total) by mouth 2 (two) times daily with  a meal. 12/20/16   Steele Sizer, MD  metoprolol (LOPRESSOR) 50 MG tablet take 1 tablet by mouth twice a day 12/16/16   Steele Sizer, MD  RA ASPIRIN EC 81 MG EC tablet take 1 tablet by mouth once daily 06/25/16   Olevia Perches P, DO    No Known Allergies  Family History  Problem Relation Age of Onset  . Heart failure Father   . Diabetes Father   . Diabetes Mellitus II Unknown   . Heart attack Neg Hx   . Hypertension Neg Hx   . Cancer Neg Hx   . COPD Neg Hx   . Stroke Neg  Hx   . Prostate cancer Neg Hx   . Kidney cancer Neg Hx   . Bladder Cancer Neg Hx     Social History Social History   Tobacco Use  . Smoking status: Never Smoker  . Smokeless tobacco: Never Used  Substance Use Topics  . Alcohol use: No    Alcohol/week: 0.0 oz  . Drug use: No    Review of Systems Constitutional: Negative for fever. Eyes: Negative for visual changes. ENT: Positive for congestion although improved. Cardiovascular: Negative for chest pain. Respiratory: Negative for shortness of breath. Gastrointestinal: Negative for abdominal pain, vomiting and diarrhea. Genitourinary: Negative for dysuria. Musculoskeletal: Negative for leg pain or swelling Skin: Negative for rash. Neurological: Negative for headache.  Denies focal weakness or numbness but does state generalized fatigue/weakness. All other ROS negative  ____________________________________________   PHYSICAL EXAM:  VITAL SIGNS: ED Triage Vitals  Enc Vitals Group     BP 11/20/17 1539 (!) 110/94     Pulse Rate 11/20/17 1539 (!) 115     Resp 11/20/17 1539 20     Temp 11/20/17 1539 (!) 94.5 F (34.7 C)     Temp Source 11/20/17 1539 Oral     SpO2 11/20/17 1539 98 %     Weight 11/20/17 1534 260 lb (117.9 kg)     Height 11/20/17 1534 5\' 11"  (1.803 m)     Head Circumference --      Peak Flow --      Pain Score --      Pain Loc --      Pain Edu? --      Excl. in GC? --    Constitutional: Alert and oriented.  Lying in bed, no distress, somewhat pale in appearance. Eyes: Normal exam ENT   Head: Normocephalic and atraumatic.   Mouth/Throat: Mucous membranes are moist. Cardiovascular: Normal rate, regular rhythm. No murmur Respiratory: Normal respiratory effort without tachypnea nor retractions. Breath sounds are clear  Gastrointestinal: Soft and nontender. No distention.   Musculoskeletal: Nontender with normal range of motion in all extremities. No lower extremity tenderness or edema. Neurologic:   Normal speech and language. No gross focal neurologic deficits.  5/5 strength in all extremities.  No pronator drift.  Cranial nerves intact. Skin:  Skin is warm, dry, somewhat pale in appearance. Psychiatric: Mood and affect are normal.   ____________________________________________    EKG  EKG reviewed and interpreted by myself shows atrial fibrillation at 115 bpm with a narrow QRS, normal axis, normal intervals, nonspecific ST changes.  ____________________________________________    RADIOLOGY  Chest x-ray negative  ____________________________________________   INITIAL IMPRESSION / ASSESSMENT AND PLAN / ED COURSE  Pertinent labs & imaging results that were available during my care of the patient were reviewed by me and considered in my medical decision making (see chart  for details).  Patient presents to the emergency department for lightheadedness, near syncope versus presyncopal episode.  Patient states he lowered himself to the ground onto his knees, did not fall to the ground, did not hit his head.  She is supposed to be on blood thinners for atrial fibrillation but stopped taking them due to finances.  Denies any focal weakness or numbness.  Differential at this time would include syncope, near syncope, anemia, sepsis, hypotension, orthostatic hypotension, dehydration, infectious etiology, metabolic abnormality.  We will check labs, begin IV hydration.  Triage blood pressure 110/94.  In the room I checked multiple times patient's current blood pressure appears to be in the low to mid 80s.  EKG consistent with atrial fibrillation.  On exam the patient has generalized fatigue, but good grip strength, 5/5 motor with effort in all extremities.  No cranial nerve deficits.  Rectal exam shows brown stool, guaiac negative.  Blood pressure increasing with fluids currently 109 systolic.  Patient's labs have resulted with creatinine of 2.06, GFR of 32, less than half of what it was  previously, consistent with acute renal failure/insufficiency, likely due to dehydration.  We will continue with IV hydration however given the syncopal episode today worsening creatinine I believe the patient would benefit from inpatient admission. ____________________________________________   FINAL CLINICAL IMPRESSION(S) / ED DIAGNOSES  syncope Acute renal insufficiency Dehydration   Minna Antis, MD 11/20/17 [phone]

## 2017-11-20 NOTE — ED Notes (Signed)
Radiology at bedside with patient

## 2017-11-20 NOTE — ED Triage Notes (Signed)
Has been feeling bad for 2 weeks, on second abx for sinus infection.  Been having generalized weakness and today passed out from feeling so weak. Mucous membranes appear pale.  Asked patient about stools and reports they were dark last night.

## 2017-11-20 NOTE — Progress Notes (Addendum)
PHARMACY NOTE -  ANTIBIOTIC RENAL DOSE ADJUSTMENT   Pharmacy to assist with antibiotic renal dose adjustment.   Patient has been initiated on levofloxacin 500mg  daily for sinusitis. Patient was taking 750mg  PTA.    11/21/17 0310 SCr 1.71, creatinine clearance greater than 50 mL/min. Levofloxacin dose modified to 500 mg po Q24H per renal dosing protocol.  Carola FrostNathan A Shakena Callari, PharmD, BCPS Clinical Pharmacist 11/20/2017 6:39 PM

## 2017-11-20 NOTE — ED Notes (Signed)
MD made aware of continual hypotension.

## 2017-11-21 ENCOUNTER — Other Ambulatory Visit: Payer: Self-pay

## 2017-11-21 DIAGNOSIS — E871 Hypo-osmolality and hyponatremia: Secondary | ICD-10-CM | POA: Diagnosis not present

## 2017-11-21 DIAGNOSIS — R55 Syncope and collapse: Secondary | ICD-10-CM | POA: Diagnosis not present

## 2017-11-21 DIAGNOSIS — I82409 Acute embolism and thrombosis of unspecified deep veins of unspecified lower extremity: Secondary | ICD-10-CM | POA: Diagnosis not present

## 2017-11-21 DIAGNOSIS — I959 Hypotension, unspecified: Secondary | ICD-10-CM | POA: Diagnosis not present

## 2017-11-21 DIAGNOSIS — N179 Acute kidney failure, unspecified: Secondary | ICD-10-CM | POA: Diagnosis not present

## 2017-11-21 DIAGNOSIS — I482 Chronic atrial fibrillation: Secondary | ICD-10-CM | POA: Diagnosis not present

## 2017-11-21 LAB — HEMOGLOBIN A1C
HEMOGLOBIN A1C: 8 % — AB (ref 4.8–5.6)
MEAN PLASMA GLUCOSE: 182.9 mg/dL

## 2017-11-21 LAB — GLUCOSE, CAPILLARY
GLUCOSE-CAPILLARY: 216 mg/dL — AB (ref 65–99)
Glucose-Capillary: 136 mg/dL — ABNORMAL HIGH (ref 65–99)
Glucose-Capillary: 206 mg/dL — ABNORMAL HIGH (ref 65–99)
Glucose-Capillary: 285 mg/dL — ABNORMAL HIGH (ref 65–99)

## 2017-11-21 LAB — BASIC METABOLIC PANEL
Anion gap: 10 (ref 5–15)
BUN: 35 mg/dL — AB (ref 6–20)
CALCIUM: 8.4 mg/dL — AB (ref 8.9–10.3)
CO2: 22 mmol/L (ref 22–32)
CREATININE: 1.71 mg/dL — AB (ref 0.61–1.24)
Chloride: 104 mmol/L (ref 101–111)
GFR calc Af Amer: 46 mL/min — ABNORMAL LOW (ref >60)
GFR, EST NON AFRICAN AMERICAN: 40 mL/min — AB (ref >60)
GLUCOSE: 140 mg/dL — AB (ref 65–99)
POTASSIUM: 4.8 mmol/L (ref 3.5–5.1)
Sodium: 136 mmol/L (ref 135–145)

## 2017-11-21 LAB — CBC
HEMATOCRIT: 45.7 % (ref 40.0–52.0)
Hemoglobin: 15.1 g/dL (ref 13.0–18.0)
MCH: 29.9 pg (ref 26.0–34.0)
MCHC: 33.2 g/dL (ref 32.0–36.0)
MCV: 90.3 fL (ref 80.0–100.0)
PLATELETS: 103 10*3/uL — AB (ref 150–440)
RBC: 5.06 MIL/uL (ref 4.40–5.90)
RDW: 13.8 % (ref 11.5–14.5)
WBC: 10.6 10*3/uL (ref 3.8–10.6)

## 2017-11-21 MED ORDER — LEVOFLOXACIN 500 MG PO TABS
500.0000 mg | ORAL_TABLET | Freq: Every day | ORAL | Status: DC
  Administered 2017-11-22 – 2017-11-23 (×2): 500 mg via ORAL
  Filled 2017-11-21 (×2): qty 1

## 2017-11-21 MED ORDER — METOPROLOL TARTRATE 50 MG PO TABS
50.0000 mg | ORAL_TABLET | Freq: Two times a day (BID) | ORAL | Status: DC
  Administered 2017-11-21 – 2017-11-23 (×5): 50 mg via ORAL
  Filled 2017-11-21 (×4): qty 1

## 2017-11-21 MED ORDER — HYDROCOD POLST-CPM POLST ER 10-8 MG/5ML PO SUER: 5.0000 mL | Freq: Every evening | ORAL | Status: DC | PRN

## 2017-11-21 MED ORDER — LEVOFLOXACIN 250 MG PO TABS
250.0000 mg | ORAL_TABLET | Freq: Once | ORAL | Status: AC
  Administered 2017-11-21: 250 mg via ORAL
  Filled 2017-11-21: qty 1

## 2017-11-21 MED ORDER — GUAIFENESIN ER 600 MG PO TB12
600.0000 mg | ORAL_TABLET | Freq: Two times a day (BID) | ORAL | Status: DC
  Administered 2017-11-21 – 2017-11-23 (×5): 600 mg via ORAL
  Filled 2017-11-21 (×5): qty 1

## 2017-11-21 NOTE — Care Management Obs Status (Signed)
MEDICARE OBSERVATION STATUS NOTIFICATION   Patient Details  Name: Jeff [name] MRN: [id number] Date of Birth: 04-Oct-1951   Medicare Observation Status Notification Given:  Yes    Gwenette GreetBrenda S Gagandeep Kossman, RN 11/21/2017, 1:36 PM

## 2017-11-21 NOTE — Patient Instructions (Signed)
Follow up as needed

## 2017-11-21 NOTE — Progress Notes (Signed)
[facility] [place] [facility]   PATIENT NAME: Jeff [name]    MR#:  [id number]  DATE OF BIRTH:  1950/11/20  SUBJECTIVE:  CHIEF COMPLAINT: Patient denies any dizziness.  Denies any nausea vomiting but feels tired.  REVIEW OF SYSTEMS:  CONSTITUTIONAL: No fever, fatigue or weakness.  EYES: No blurred or double vision.  EARS, NOSE, AND THROAT: No tinnitus or ear pain.  RESPIRATORY: No cough, shortness of breath, wheezing or hemoptysis.  CARDIOVASCULAR: No chest pain, orthopnea, edema.  GASTROINTESTINAL: No nausea, vomiting, diarrhea or abdominal pain.  GENITOURINARY: No dysuria, hematuria.  ENDOCRINE: No polyuria, nocturia,  HEMATOLOGY: No anemia, easy bruising or bleeding SKIN: No rash or lesion. MUSCULOSKELETAL: No joint pain or arthritis.   NEUROLOGIC: No tingling, numbness, weakness.  PSYCHIATRY: No anxiety or depression.   DRUG ALLERGIES:  No Known Allergies  VITALS:  Blood pressure 98/68, pulse 90, temperature 98 F (36.7 C), temperature source Oral, resp. rate 18, height 5\' 11"  (1.803 m), weight 121.5 kg (267 lb 14.4 oz), SpO2 98 %.  PHYSICAL EXAMINATION:  GENERAL:  67 y.o.-year-old patient lying in the bed with no acute distress.  EYES: Pupils equal, round, reactive to light and accommodation. No scleral icterus. Extraocular muscles intact.  HEENT: Head atraumatic, normocephalic. Oropharynx and nasopharynx clear.  NECK:  Supple, no jugular venous distention. No thyroid enlargement, no tenderness.  LUNGS: Normal breath sounds bilaterally, no wheezing, rales,rhonchi or crepitation. No use of accessory muscles of respiration.  CARDIOVASCULAR: S1, S2 normal. No murmurs, rubs, or gallops.  ABDOMEN: Soft, nontender, nondistended. Bowel sounds present. No organomegaly or mass.  EXTREMITIES: No pedal edema, cyanosis, or clubbing.  NEUROLOGIC: Cranial nerves II through XII are intact. Muscle strength 5/5 in all extremities. Sensation intact. Gait not  checked.  PSYCHIATRIC: The patient is alert and oriented x 3.  SKIN: No obvious rash, lesion, or ulcer.    LABORATORY PANEL:   CBC Recent Labs  Lab 11/21/17 0310  WBC 10.6  HGB 15.1  HCT 45.7  PLT 103*   ------------------------------------------------------------------------------------------------------------------  Chemistries  Recent Labs  Lab 11/20/17 1552 11/21/17 0310  NA 133* 136  K 5.0 4.8  CL 100* 104  CO2 20* 22  GLUCOSE 288* 140*  BUN 37* 35*  CREATININE 2.06* 1.71*  CALCIUM 8.8* 8.4*  AST 21  --   ALT 12*  --   ALKPHOS 77  --   BILITOT 1.9*  --    ------------------------------------------------------------------------------------------------------------------  Cardiac Enzymes Recent Labs  Lab 11/20/17 1552  TROPONINI <0.03   ------------------------------------------------------------------------------------------------------------------  RADIOLOGY:  Dg Chest Portable 1 View  Result Date: 11/20/2017 CLINICAL DATA:  History of heart failure with increasing weakness last week. EXAM: PORTABLE CHEST 1 VIEW COMPARISON:  February 23, 2012 FINDINGS: The mediastinal contour is normal. The heart size is mildly enlarged. There is no focal infiltrate, pulmonary edema, or pleural effusion. The visualized skeletal structures are unremarkable. IMPRESSION: No active cardiopulmonary disease. Electronically Signed   By: Sherian Rein M.D.   On: 11/20/2017 16:22    EKG:   Orders placed or performed during the hospital encounter of 11/20/17  . ED EKG  . ED EKG  . EKG 12-Lead  . EKG 12-Lead    ASSESSMENT AND PLAN:   Jeff [name]  is a 67 y.o. male with a known history of chronic atrial fibrillation not on anticoagulation, tachycardia induced cardiomyopathy with EF of 45%, history of DVT not on anticoagulation currently, hypertension, non-insulin-dependent diabetes mellitus, hyperlipidemia presents to hospital  secondary to a syncopal episode.  1.   Syncope-secondary to hypovolemia and weakness. -IV fluids, orthostatic signs blood pressure is improved. -Hold blood pressure medications for now.  2.  Acute renal failure- due to pre renal causes, ATN - hold BP meds, IV fluids to avoid hypotension - monitor. Hold nephrotoxins - monitor urine output -Monitor creatinine 2.06-1.71  3.  Acute sinusitis- no fevers, wbc elevated- but was on prednisone recently - continue levaquin-   4. Hyponatremia- hypovolemic, monitor with fluids -Sodium improved to 136.  Potassium 4.8 Also borderline hyperkalemia- monitor while off of lisinopril  5.  Hypertension- hold metoprolol, lisinopril and norvasc due to hypotension - IV fluids and monitor  6. Diabetes- hold metformin due to renal failure, IV fluids SSI   7.  Chronic atrial fibrillation-follows with cardiology.  Rate controlled at this time.  Hold metoprolol for now.  IV fluids On aspirin only for anticoagulation at this time.  8. DVT Prophylaxis- lovenox       All the records are reviewed and case discussed with Care Management/Social Workerr. Management plans discussed with the patient, family and they are in agreement.  CODE STATUS: fc   TOTAL TIME TAKING CARE OF THIS PATIENT: 36  minutes.   POSSIBLE D/C IN 1-2 DAYS, DEPENDING ON CLINICAL CONDITION.  Note: This dictation was prepared with Dragon dictation along with smaller phrase technology. Any transcriptional errors that result from this process are unintentional.   Jeff [name] M.D on 11/21/2017 at 4:23 PM  Between 7am to 6pm - Pager - [phone] After 6pm go to [email or web address] - password [email or web address] [facility]  [facility]  Office  [phone]  CC: Primary care physician; Steele Sizerrissman, Mark A, MD

## 2017-11-22 ENCOUNTER — Observation Stay: Payer: Medicare HMO

## 2017-11-22 DIAGNOSIS — R600 Localized edema: Secondary | ICD-10-CM | POA: Diagnosis not present

## 2017-11-22 DIAGNOSIS — I482 Chronic atrial fibrillation: Secondary | ICD-10-CM | POA: Diagnosis not present

## 2017-11-22 DIAGNOSIS — I82409 Acute embolism and thrombosis of unspecified deep veins of unspecified lower extremity: Secondary | ICD-10-CM | POA: Diagnosis not present

## 2017-11-22 DIAGNOSIS — R55 Syncope and collapse: Secondary | ICD-10-CM | POA: Diagnosis not present

## 2017-11-22 DIAGNOSIS — I959 Hypotension, unspecified: Secondary | ICD-10-CM | POA: Diagnosis not present

## 2017-11-22 DIAGNOSIS — N179 Acute kidney failure, unspecified: Secondary | ICD-10-CM | POA: Diagnosis not present

## 2017-11-22 DIAGNOSIS — E871 Hypo-osmolality and hyponatremia: Secondary | ICD-10-CM | POA: Diagnosis not present

## 2017-11-22 LAB — BASIC METABOLIC PANEL
Anion gap: 9 (ref 5–15)
BUN: 26 mg/dL — AB (ref 6–20)
CALCIUM: 8.6 mg/dL — AB (ref 8.9–10.3)
CO2: 20 mmol/L — AB (ref 22–32)
Chloride: 104 mmol/L (ref 101–111)
Creatinine, Ser: 1.57 mg/dL — ABNORMAL HIGH (ref 0.61–1.24)
GFR calc Af Amer: 51 mL/min — ABNORMAL LOW (ref >60)
GFR, EST NON AFRICAN AMERICAN: 44 mL/min — AB (ref >60)
GLUCOSE: 172 mg/dL — AB (ref 65–99)
Potassium: 5 mmol/L (ref 3.5–5.1)
Sodium: 133 mmol/L — ABNORMAL LOW (ref 135–145)

## 2017-11-22 LAB — GLUCOSE, CAPILLARY
GLUCOSE-CAPILLARY: 181 mg/dL — AB (ref 65–99)
GLUCOSE-CAPILLARY: 220 mg/dL — AB (ref 65–99)
Glucose-Capillary: 153 mg/dL — ABNORMAL HIGH (ref 65–99)
Glucose-Capillary: 211 mg/dL — ABNORMAL HIGH (ref 65–99)

## 2017-11-22 LAB — PROTIME-INR
INR: 1.13
Prothrombin Time: 14.4 seconds (ref 11.4–15.2)

## 2017-11-22 LAB — HEPARIN LEVEL (UNFRACTIONATED): Heparin Unfractionated: 0.62 IU/mL (ref 0.30–0.70)

## 2017-11-22 LAB — APTT: APTT: 29 s (ref 24–36)

## 2017-11-22 MED ORDER — SODIUM CHLORIDE 0.9 % IV BOLUS (SEPSIS)
500.0000 mL | Freq: Once | INTRAVENOUS | Status: AC
  Administered 2017-11-22: 13:00:00 500 mL via INTRAVENOUS

## 2017-11-22 MED ORDER — HEPARIN BOLUS VIA INFUSION
5000.0000 [IU] | Freq: Once | INTRAVENOUS | Status: AC
  Administered 2017-11-22: 5000 [IU] via INTRAVENOUS
  Filled 2017-11-22: qty 5000

## 2017-11-22 MED ORDER — INSULIN ASPART 100 UNIT/ML ~~LOC~~ SOLN
2.0000 [IU] | Freq: Three times a day (TID) | SUBCUTANEOUS | Status: DC
  Administered 2017-11-22 – 2017-11-23 (×4): 2 [IU] via SUBCUTANEOUS
  Filled 2017-11-22 (×4): qty 1

## 2017-11-22 MED ORDER — HEPARIN (PORCINE) IN NACL 100-0.45 UNIT/ML-% IJ SOLN
1750.0000 [IU]/h | INTRAMUSCULAR | Status: DC
  Administered 2017-11-22 – 2017-11-23 (×2): 1750 [IU]/h via INTRAVENOUS
  Filled 2017-11-22 (×2): qty 250

## 2017-11-22 MED ORDER — SODIUM CHLORIDE 0.9 % IV SOLN
INTRAVENOUS | Status: AC
  Administered 2017-11-22 – 2017-11-23 (×3): via INTRAVENOUS

## 2017-11-22 NOTE — Progress Notes (Signed)
Inpatient Diabetes Program Recommendations  AACE/ADA: New Consensus Statement on Inpatient Glycemic Control (2015)  Target Ranges:  Prepandial:   less than 140 mg/dL      Peak postprandial:   less than 180 mg/dL (1-2 hours)      Critically ill patients:  140 - 180 mg/dL   Results for Jeff [name], Jeff [name] (MRN [id number]) as of 11/22/2017 10:21  Ref. Range 11/21/2017 07:33 11/21/2017 11:55 11/21/2017 16:41 11/21/2017 20:12 11/22/2017 07:40  Glucose-Capillary Latest Ref Range: 65 - 99 mg/dL 213136 (H) 086216 (H) 578206 (H) 285 (H) 153 (H)  Results for Jeff [name], Jeff [name] (MRN [id number]) as of 11/22/2017 10:21  Ref. Range 11/20/2017 18:25  Hemoglobin A1C Latest Ref Range: 4.8 - 5.6 % 8.0 (H)   Review of Glycemic Control  Diabetes history: DM2 Outpatient Diabetes medications: Metformin 1000 mg BID Current orders for Inpatient glycemic control: Novolog 0-9 units TID with meals, Novolog 0-5 units QHS  Inpatient Diabetes Program Recommendations:  Insulin - Meal Coverage: Post prandial glucose is consistently elevated. While inpatient, please consider ordering Novolog 3 units TID with meals for meal coverage if patient eats at least 50% of meals. HgbA1C: A1C 8% on 11/20/17 indicating an average glucose of 183 mg/dl over the past 2-3 months. Noted on H&P that patient has recently been on prednisone for sinusitis which is contributing to elevated A1C. Recommend patient follow up with PCP regarding DM control.  Thanks, Orlando PennerMarie Garet Hooton, RN, MSN, CDE Diabetes Coordinator Inpatient Diabetes Program [phone] (Team Pager from 8am to 5pm)

## 2017-11-22 NOTE — Progress Notes (Addendum)
[facility]  [phone].  Called and said pt is positive for DVT.. Dr Amado CoeGouru was made aware. Also made aware the the tech stated the results are also found in epic.  Took report because primary nurse was unavailable.

## 2017-11-22 NOTE — Progress Notes (Signed)
[facility] [place] [facility]   PATIENT NAME: Jeff [name]    MR#:  [id number]  DATE OF BIRTH:  1951-08-18  SUBJECTIVE:  CHIEF COMPLAINT: Patient reporting left leg pain had history of DVT in the past and had IVC filter placed several years ago, denies any nausea vomiting but feels tired.  REVIEW OF SYSTEMS:  CONSTITUTIONAL: No fever, fatigue or weakness.  EYES: No blurred or double vision.  EARS, NOSE, AND THROAT: No tinnitus or ear pain.  RESPIRATORY: No cough, shortness of breath, wheezing or hemoptysis.  CARDIOVASCULAR: No chest pain, orthopnea, edema.  GASTROINTESTINAL: No nausea, vomiting, diarrhea or abdominal pain.  GENITOURINARY: No dysuria, hematuria.  ENDOCRINE: No polyuria, nocturia,  HEMATOLOGY: No anemia, easy bruising or bleeding SKIN: No rash or lesion. MUSCULOSKELETAL: Left leg pain and swelling    NEUROLOGIC: No tingling, numbness, weakness.  PSYCHIATRY: No anxiety or depression.   DRUG ALLERGIES:  No Known Allergies  VITALS:  Blood pressure 114/86, pulse 90, temperature 97.9 F (36.6 C), temperature source Oral, resp. rate 20, height 5\' 11"  (1.803 m), weight 121.5 kg (267 lb 14.4 oz), SpO2 97 %.  PHYSICAL EXAMINATION:  GENERAL:  67 y.o.-year-old patient lying in the bed with no acute distress.  EYES: Pupils equal, round, reactive to light and accommodation. No scleral icterus. Extraocular muscles intact.  HEENT: Head atraumatic, normocephalic. Oropharynx and nasopharynx clear.  NECK:  Supple, no jugular venous distention. No thyroid enlargement, no tenderness.  LUNGS: Normal breath sounds bilaterally, no wheezing, rales,rhonchi or crepitation. No use of accessory muscles of respiration.  CARDIOVASCULAR: S1, S2 normal. No murmurs, rubs, or gallops.  ABDOMEN: Soft, nontender, nondistended. Bowel sounds present. No organomegaly or mass.  EXTREMITIES: left lower extremity is tender, erythematous and edematous no joint pain or  arthritis.No pedal edema, cyanosis, or clubbing.  NEUROLOGIC: Cranial nerves II through XII are intact. Muscle strength 5/5 in all extremities. Sensation intact. Gait not checked.  PSYCHIATRIC: The patient is alert and oriented x 3.  SKIN: No obvious rash, lesion, or ulcer.    LABORATORY PANEL:   CBC Recent Labs  Lab 11/21/17 0310  WBC 10.6  HGB 15.1  HCT 45.7  PLT 103*   ------------------------------------------------------------------------------------------------------------------  Chemistries  Recent Labs  Lab 11/20/17 1552  11/22/17 0554  NA 133*   < > 133*  K 5.0   < > 5.0  CL 100*   < > 104  CO2 20*   < > 20*  GLUCOSE 288*   < > 172*  BUN 37*   < > 26*  CREATININE 2.06*   < > 1.57*  CALCIUM 8.8*   < > 8.6*  AST 21  --   --   ALT 12*  --   --   ALKPHOS 77  --   --   BILITOT 1.9*  --   --    < > = values in this interval not displayed.   ------------------------------------------------------------------------------------------------------------------  Cardiac Enzymes Recent Labs  Lab 11/20/17 1552  TROPONINI <0.03   ------------------------------------------------------------------------------------------------------------------  RADIOLOGY:  US Venous Img Lower Unilateral Left  Result Date: 11/22/2017 CLINICAL DATA:  Left lower extremity pain and edema for the past 2-3 days. History of prior DVT and IVC filter placement. Evaluate for acute or chronic DVT. EXAM: LEFT LOWER EXTREMITY VENOUS DOPPLER ULTRASOUND TECHNIQUE: Gray-scale sonography with graded compression, as well as color Doppler and duplex ultrasound were performed to evaluate the lower extremity deep venous systems from the level of the common  femoral vein and including the common femoral, femoral, profunda femoral, popliteal and calf veins including the posterior tibial, peroneal and gastrocnemius veins when visible. The superficial great saphenous vein was also interrogated. Spectral Doppler was  utilized to evaluate flow at rest and with distal augmentation maneuvers in the common femoral, femoral and popliteal veins. COMPARISON:  None. FINDINGS: Contralateral Common Femoral Vein: Respiratory phasicity is normal and symmetric with the symptomatic side. No evidence of acute or chronic thrombus. Normal compressibility. There is hypoechoic near occlusive thrombus within the left common femoral vein (images 6 and 7. The saphenofemoral junction as well as the greater saphenous vein appear patent where imaged (images 10 and 12). There is hypoechoic expansile occlusive thrombus involving the imaged portions of the left deep femoral (images 14 - 16). There is mixed echogenic occlusive thrombus seen throughout the proximal (image 18), mid (image 21) and distal (image 25) aspects of the left femoral vein. There is hypoechoic expansile thrombus involving the proximal (image 27) and distal (image 31) aspects of the left popliteal vein. Duplicated posterior tibial veins appear patent where imaged. The peroneal vein was not visualized. Other Findings:  None. IMPRESSION: Examination is positive for extensive predominantly occlusive DVT extending from the left common femoral through the left popliteal vein. Given history of prior DVT and in the absence of prior examinations, the chronicity of these findings is indeterminate, though given the expansile nature of the thrombus, the majority of the DVT is favored to be acute in etiology. Clinical correlation is advised. These results will be called to the ordering clinician or representative by the Radiologist Assistant, and communication documented in the PACS or zVision Dashboard. Electronically Signed   By: Simonne ComeJohn  Watts M.D.   On: 11/22/2017 15:15   Dg Chest Portable 1 View  Result Date: 11/20/2017 CLINICAL DATA:  History of heart failure with increasing weakness last week. EXAM: PORTABLE CHEST 1 VIEW COMPARISON:  February 23, 2012 FINDINGS: The mediastinal contour is  normal. The heart size is mildly enlarged. There is no focal infiltrate, pulmonary edema, or pleural effusion. The visualized skeletal structures are unremarkable. IMPRESSION: No active cardiopulmonary disease. Electronically Signed   By: Sherian ReinWei-Chen  Lin M.D.   On: 11/20/2017 16:22    EKG:   Orders placed or performed during the hospital encounter of 11/20/17  . ED EKG  . ED EKG  . EKG 12-Lead  . EKG 12-Lead    ASSESSMENT AND PLAN:   Abigail Buttsed Vanputten  is a 67 y.o. male with a known history of chronic atrial fibrillation not on anticoagulation, tachycardia induced cardiomyopathy with EF of 45%, history of DVT not on anticoagulation currently, hypertension, non-insulin-dependent diabetes mellitus, hyperlipidemia presents to hospital secondary to a syncopal episode.  #Acute left lower extremity DVT Patient is started on heparin drip  reports he had IVC filter in the past given the old history of DVT but not quite sure Vascular surgery consulted  #.  Syncope-secondary to hypovolemia and weakness. -IV fluids, orthostatic signs blood pressure is improved. -Hold blood pressure medications for now.  #.  Acute renal failure- due to pre renal causes, ATN - hold BP meds, IV fluids to avoid hypotension - monitor. Hold nephrotoxins - monitor urine output -Monitor creatinine 2.06-1.71- 1.57  3.  Acute sinusitis- no fevers, wbc elevated- but was on prednisone recently - continue levaquin-   4. Hyponatremia- hypovolemic, monitor with fluids -Sodium improved to 136.  Potassium 4.8- 5.0  Also borderline hyperkalemia- monitor while off of lisinopril  5.  Hypertension- hold metoprolol, lisinopril and norvasc due to hypotension - IV fluids and monitor  6. Diabetes- hold metformin due to renal failure, IV fluids SSI   7.  Chronic atrial fibrillation-follows with cardiology.  Rate controlled at this time.  Hold metoprolol for now.  IV fluids On aspirin   8. DVT Prophylaxis- Heparin  gtt       All the records are reviewed and case discussed with Care Management/Social Workerr. Management plans discussed with the patient, family and they are in agreement.  CODE STATUS: fc   TOTAL TIME TAKING CARE OF THIS PATIENT: 36  minutes.   POSSIBLE D/C IN 1-2 DAYS, DEPENDING ON CLINICAL CONDITION.  Note: This dictation was prepared with Dragon dictation along with smaller phrase technology. Any transcriptional errors that result from this process are unintentional.   Ramonita Lab M.D on 11/22/2017 at 3:56 PM  Between 7am to 6pm - Pager - [phone] After 6pm go to [email or web address] - password [email or web address] [facility]  [place] [facility]  Office  [phone]  CC: Primary care physician; Steele Sizer, MD

## 2017-11-22 NOTE — Progress Notes (Signed)
Nutrition Brief Note  Patient identified on the Malnutrition Screening Tool (MST) Report  67 y.o. male with a known history of chronic atrial fibrillation not on anticoagulation, tachycardia induced cardiomyopathy with EF of 45%, history of DVT not on anticoagulation currently, hypertension, non-insulin-dependent diabetes mellitus, hyperlipidemia presents to hospital secondary to a syncopal episode.  Wt Readings from Last 15 Encounters:  11/20/17 267 lb 14.4 oz (121.5 kg)  11/18/17 266 lb 11.2 oz (121 kg)  11/11/17 278 lb 2 oz (126.2 kg)  09/07/17 256 lb (116.1 kg)  03/03/17 264 lb 8 oz (120 kg)  12/31/16 262 lb (118.8 kg)  12/28/16 268 lb (121.6 kg)  12/20/16 262 lb (118.8 kg)  10/04/16 280 lb (127 kg)  05/28/16 260 lb (117.9 kg)  01/26/16 272 lb (123.4 kg)  01/19/16 268 lb (121.6 kg)  01/06/16 274 lb (124.3 kg)  09/01/15 269 lb (122 kg)  05/22/15 274 lb (124.3 kg)    Body mass index is 37.36 kg/m. Patient meets criteria for obesity based on current BMI.   Current diet order is HH, patient is consuming approximately 100% of meals at this time. Labs and medications reviewed.   RD provided diabetes education today  No nutrition interventions warranted at this time. If nutrition issues arise, please consult RD.   Betsey Holidayasey Airianna Kreischer MS, RD, LDN Pager #- [phone] After Hours Pager: [phone]

## 2017-11-22 NOTE — Plan of Care (Signed)
  Progressing Education: Knowledge of General Education information will improve 11/22/2017 1854 - Progressing by Luretha MurphyMiles, Afreen Siebels, RN Health Behavior/Discharge Planning: Ability to manage health-related needs will improve 11/22/2017 1854 - Progressing by Luretha MurphyMiles, Landa Mullinax, RN Clinical Measurements: Ability to maintain clinical measurements within normal limits will improve 11/22/2017 1854 - Progressing by Luretha MurphyMiles, Keily Lepp, RN Will remain free from infection 11/22/2017 1854 - Progressing by Luretha MurphyMiles, Amaan Meyer, RN Diagnostic test results will improve 11/22/2017 1854 - Progressing by Luretha MurphyMiles, Deyon Chizek, RN Respiratory complications will improve 11/22/2017 1854 - Progressing by Luretha MurphyMiles, Jaimey Franchini, RN Cardiovascular complication will be avoided 11/22/2017 1854 - Progressing by Luretha MurphyMiles, Geneve Kimpel, RN Activity: Risk for activity intolerance will decrease 11/22/2017 1854 - Progressing by Luretha MurphyMiles, Lynnsie Linders, RN Nutrition: Adequate nutrition will be maintained 11/22/2017 1854 - Progressing by Luretha MurphyMiles, Oddie Bottger, RN Coping: Level of anxiety will decrease 11/22/2017 1854 - Progressing by Luretha MurphyMiles, Clint Strupp, RN Elimination: Will not experience complications related to bowel motility 11/22/2017 1854 - Progressing by Luretha MurphyMiles, Mavery Milling, RN Will not experience complications related to urinary retention 11/22/2017 1854 - Progressing by Luretha MurphyMiles, Alysson Geist, RN Pain Managment: General experience of comfort will improve 11/22/2017 1854 - Progressing by Luretha MurphyMiles, Petrita Blunck, RN Safety: Ability to remain free from injury will improve 11/22/2017 1854 - Progressing by Luretha MurphyMiles, Erasto Sleight, RN

## 2017-11-22 NOTE — Progress Notes (Signed)
ANTICOAGULATION CONSULT NOTE - Initial Consult  Pharmacy Consult for Heparin Drip Indication: DVT  No Known Allergies  Patient Measurements: Height: 5\' 11"  (180.3 cm) Weight: 267 lb 14.4 oz (121.5 kg) IBW/kg (Calculated) : 75.3 Heparin Dosing Weight: 102.3 kg  Vital Signs: Temp: 97.9 F (36.6 C) (01/15 1552) Temp Source: Oral (01/15 1552) BP: 114/86 (01/15 1552) Pulse Rate: 90 (01/15 1552)  Labs: Recent Labs    11/20/17 1552 11/21/17 0310 11/22/17 0554 11/22/17 1604  HGB 16.2 15.1  --   --   HCT 49.1 45.7  --   --   PLT 143* 103*  --   --   APTT  --   --   --  29  LABPROT  --   --   --  14.4  INR  --   --   --  1.13  CREATININE 2.06* 1.71* 1.57*  --   TROPONINI <0.03  --   --   --     Estimated Creatinine Clearance: 61.4 mL/min (A) (by C-G formula based on SCr of 1.57 mg/dL (H)).   Medical History: Past Medical History:  Diagnosis Date  . Atrial fibrillation (HCC)   . Chronic low back pain   . Chronic systolic heart failure (HCC)    with EF of 40-45% by echocardiogram, liekly tachycardia induced cariomyopathy  . Diabetes mellitus without complication (HCC)   . DVT of lower limb, acute (HCC) 2014   off anti-coagulation  . Erectile dysfunction   . HLD (hyperlipidemia)   . Hypertension   . Hypogonadism in male     Medications:  Scheduled:  . aspirin EC  81 mg Oral Daily  . atorvastatin  10 mg Oral Daily  . fluticasone  2 spray Each Nare Daily  . guaiFENesin  600 mg Oral BID  . insulin aspart  0-5 Units Subcutaneous QHS  . insulin aspart  0-9 Units Subcutaneous TID WC  . insulin aspart  2 Units Subcutaneous TID WC  . levofloxacin  500 mg Oral Daily  . metoprolol tartrate  50 mg Oral BID   Infusions:  . sodium chloride 100 mL/hr at 11/22/17 1248  . heparin 1,750 Units/hr (11/22/17 1627)    Assessment: 67 yo M with + DVT. Hx of DVT in the past and had IVC filter. Patient on aspirin PTA.  Hgb 15.1  Plt 103  INR 1.13  APTT 29  (Patient did receive  Lovenox 40 mg on 1/14 at 2119)   Goal of Therapy:  Heparin level 0.3-0.7 units/ml Monitor platelets by anticoagulation protocol: Yes   Plan:  Give 5000 units bolus x 1 Start heparin infusion at 1750 units/hr Check anti-Xa level in 6 hours and daily while on heparin Continue to monitor H&H and platelets  Dmitri Pettigrew A 11/22/2017,5:03 PM

## 2017-11-23 ENCOUNTER — Other Ambulatory Visit: Payer: Self-pay | Admitting: Family Medicine

## 2017-11-23 DIAGNOSIS — E782 Mixed hyperlipidemia: Secondary | ICD-10-CM

## 2017-11-23 DIAGNOSIS — I482 Chronic atrial fibrillation: Secondary | ICD-10-CM | POA: Diagnosis not present

## 2017-11-23 DIAGNOSIS — I10 Essential (primary) hypertension: Secondary | ICD-10-CM

## 2017-11-23 DIAGNOSIS — E871 Hypo-osmolality and hyponatremia: Secondary | ICD-10-CM | POA: Diagnosis not present

## 2017-11-23 DIAGNOSIS — I959 Hypotension, unspecified: Secondary | ICD-10-CM | POA: Diagnosis not present

## 2017-11-23 DIAGNOSIS — N179 Acute kidney failure, unspecified: Secondary | ICD-10-CM | POA: Diagnosis not present

## 2017-11-23 DIAGNOSIS — I82409 Acute embolism and thrombosis of unspecified deep veins of unspecified lower extremity: Secondary | ICD-10-CM | POA: Diagnosis not present

## 2017-11-23 DIAGNOSIS — R55 Syncope and collapse: Secondary | ICD-10-CM | POA: Diagnosis not present

## 2017-11-23 LAB — BASIC METABOLIC PANEL
Anion gap: 9 (ref 5–15)
BUN: 19 mg/dL (ref 6–20)
CALCIUM: 8.2 mg/dL — AB (ref 8.9–10.3)
CO2: 18 mmol/L — AB (ref 22–32)
CREATININE: 1.18 mg/dL (ref 0.61–1.24)
Chloride: 106 mmol/L (ref 101–111)
Glucose, Bld: 147 mg/dL — ABNORMAL HIGH (ref 65–99)
Potassium: 4.2 mmol/L (ref 3.5–5.1)
SODIUM: 133 mmol/L — AB (ref 135–145)

## 2017-11-23 LAB — GLUCOSE, CAPILLARY
GLUCOSE-CAPILLARY: 140 mg/dL — AB (ref 65–99)
Glucose-Capillary: 216 mg/dL — ABNORMAL HIGH (ref 65–99)

## 2017-11-23 LAB — CBC
HCT: 41 % (ref 40.0–52.0)
Hemoglobin: 14.1 g/dL (ref 13.0–18.0)
MCH: 30.2 pg (ref 26.0–34.0)
MCHC: 34.4 g/dL (ref 32.0–36.0)
MCV: 87.8 fL (ref 80.0–100.0)
PLATELETS: 111 10*3/uL — AB (ref 150–440)
RBC: 4.67 MIL/uL (ref 4.40–5.90)
RDW: 13.6 % (ref 11.5–14.5)
WBC: 8.6 10*3/uL (ref 3.8–10.6)

## 2017-11-23 LAB — HEPARIN LEVEL (UNFRACTIONATED): HEPARIN UNFRACTIONATED: 0.58 [IU]/mL (ref 0.30–0.70)

## 2017-11-23 MED ORDER — RIVAROXABAN 15 MG PO TABS
15.0000 mg | ORAL_TABLET | Freq: Two times a day (BID) | ORAL | Status: DC
  Filled 2017-11-23: qty 1

## 2017-11-23 MED ORDER — RIVAROXABAN 15 MG PO TABS: 15.0000 mg | ORAL_TABLET | Freq: Two times a day (BID) | ORAL | 0 refills | Status: DC

## 2017-11-23 MED ORDER — RIVAROXABAN 20 MG PO TABS: 20.0000 mg | ORAL_TABLET | Freq: Every day | ORAL | Status: DC

## 2017-11-23 MED ORDER — RIVAROXABAN 20 MG PO TABS: 20.0000 mg | ORAL_TABLET | Freq: Every day | ORAL | 0 refills | Status: DC

## 2017-11-23 MED ORDER — LEVOFLOXACIN 500 MG PO TABS: 500.0000 mg | ORAL_TABLET | Freq: Every day | ORAL | 0 refills | Status: DC

## 2017-11-23 NOTE — Progress Notes (Signed)
ANTICOAGULATION CONSULT NOTE - Initial Consult  Pharmacy Consult for Heparin Drip Indication: DVT  No Known Allergies  Patient Measurements: Height: 5\' 11"  (180.3 cm) Weight: 267 lb 14.4 oz (121.5 kg) IBW/kg (Calculated) : 75.3 Heparin Dosing Weight: 102.3 kg  Vital Signs: Temp: 98.1 F (36.7 C) (01/15 2058) Temp Source: Oral (01/15 2058) BP: 106/75 (01/15 2058) Pulse Rate: 105 (01/15 2058)  Labs: Recent Labs    11/20/17 1552 11/21/17 0310 11/22/17 0554 11/22/17 1604 11/22/17 2253  HGB 16.2 15.1  --   --   --   HCT 49.1 45.7  --   --   --   PLT 143* 103*  --   --   --   APTT  --   --   --  29  --   LABPROT  --   --   --  14.4  --   INR  --   --   --  1.13  --   HEPARINUNFRC  --   --   --   --  0.62  CREATININE 2.06* 1.71* 1.57*  --   --   TROPONINI <0.03  --   --   --   --     Estimated Creatinine Clearance: 61.4 mL/min (A) (by C-G formula based on SCr of 1.57 mg/dL (H)).   Medical History: Past Medical History:  Diagnosis Date  . Atrial fibrillation (HCC)   . Chronic low back pain   . Chronic systolic heart failure (HCC)    with EF of 40-45% by echocardiogram, liekly tachycardia induced cariomyopathy  . Diabetes mellitus without complication (HCC)   . DVT of lower limb, acute (HCC) 2014   off anti-coagulation  . Erectile dysfunction   . HLD (hyperlipidemia)   . Hypertension   . Hypogonadism in male     Medications:  Scheduled:  . aspirin EC  81 mg Oral Daily  . atorvastatin  10 mg Oral Daily  . fluticasone  2 spray Each Nare Daily  . guaiFENesin  600 mg Oral BID  . insulin aspart  0-5 Units Subcutaneous QHS  . insulin aspart  0-9 Units Subcutaneous TID WC  . insulin aspart  2 Units Subcutaneous TID WC  . levofloxacin  500 mg Oral Daily  . metoprolol tartrate  50 mg Oral BID   Infusions:  . sodium chloride 100 mL/hr at 11/22/17 1929  . heparin 1,750 Units/hr (11/22/17 1627)    Assessment: 67 yo M with + DVT. Hx of DVT in the past and had IVC  filter. Patient on aspirin PTA.  Hgb 15.1  Plt 103  INR 1.13  APTT 29  (Patient did receive Lovenox 40 mg on 1/14 at 2119)   Goal of Therapy:  Heparin level 0.3-0.7 units/ml Monitor platelets by anticoagulation protocol: Yes   Plan:  Give 5000 units bolus x 1 Start heparin infusion at 1750 units/hr Check anti-Xa level in 6 hours and daily while on heparin Continue to monitor H&H and platelets   01/15 @ 2300 HL 0.62 therapeutic. Will continue current rate and will recheck HL/CBC w/ am labs.  Thomasene Rippleavid Nevea Spiewak, PharmD, BCPS Clinical Pharmacist 11/23/2017

## 2017-11-23 NOTE — Discharge Summary (Signed)
[facility] - [place] at [facility]   PATIENT NAME: Jeff [name]    MR#:  [id number]  DATE OF BIRTH:  [birth year]  DATE OF ADMISSION:  11/20/2017 ADMITTING PHYSICIAN: Enid Baas, MD  DATE OF DISCHARGE:  11/23/17   PRIMARY CARE PHYSICIAN: Steele Sizer, MD    ADMISSION DIAGNOSIS:  Dehydration [E86.0] Acute renal insufficiency [N28.9] Syncope, unspecified syncope type [R55]  DISCHARGE DIAGNOSIS:  Active Problems:   Acute renal failure (ARF) (HCC) LLE DVT  SECONDARY DIAGNOSIS:   Past Medical History:  Diagnosis Date  . Atrial fibrillation (HCC)   . Chronic low back pain   . Chronic systolic heart failure (HCC)    with EF of 40-45% by echocardiogram, liekly tachycardia induced cariomyopathy  . Diabetes mellitus without complication (HCC)   . DVT of lower limb, acute (HCC) 2014   off anti-coagulation  . Erectile dysfunction   . HLD (hyperlipidemia)   . Hypertension   . Hypogonadism in male     HOSPITAL COURSE:   HPI  Jeff [name]  is a 67 y.o. male with a known history of chronic atrial fibrillation not on anticoagulation, tachycardia induced cardiomyopathy with EF of 45%, history of DVT not on anticoagulation currently, hypertension, non-insulin-dependent diabetes mellitus, hyperlipidemia presents to hospital secondary to a syncopal episode. Symptoms started about 10 days ago with worsening cough, congestion and he was started on oral prednisone and Tussionex.  Since his symptoms did not improve, his 3 days ago.  Patient started to feel a little better but has been complaining of significant weakness and lack of appetite.  His oral intake has been decreased.  This morning he was walking to get a bowl of soup and he felt weak in his knees and slid to the floor and then passed out for a few seconds.  Noted to be hypotensive when he came to the emergency room.  Pressure slowly improving with IV fluids.  Labs also indicate acute renal failure, likely from  dehydration.  Patient denies any fevers or chills at this time.  No nausea, abdominal pain, vomiting or diarrhea.   #Acute left lower extremity DVT Patient is started on heparin drip, discontinue heparin drip and start the patient on Xarelto Outpatient follow-up with vascular surgery in a week okay to discharge patient from vascular standpoint.  reports he had IVC filter in the past given the old history of DVT but not quite sure  #. Syncope-secondary to hypovolemia and weakness. -IV fluids, orthostatic signs blood pressure is improved. -Hold blood pressure medications for now.  #. Acute renal failure- due to pre renal causes, ATN - hold BP meds, IV fluids given  to avoid hypotension - monitor. Hold nephrotoxins - monitor urine output -Monitor creatinine 2.06-1.71- 1.57--1.18  3. Acute sinusitis- no fevers, wbc elevated- but was on prednisone recently - continue levaquin-   4. Hyponatremia-hypovolemic, monitor with fluids -Sodium improved to 136.  Potassium 4.8- 5.0 --4.2 Also borderline hyperkalemia- monitor while off of lisinopril  5. Hypertension- Resumed metoprolol  hold  lisinopril and norvasc due to hypotension - IV fluids given during the hospital course  6.Diabetes-  resume metformin which was held during the hospital course due to renal failure, IV fluids SSIduring hospital course  7. Chronic atrial fibrillation-follows with cardiology. Rate controlled at this time. Hold metoprolol for now. IV fluids provided  changing to p.o. Xarelto, will discontinue aspirin Outpatient follow-up with Dr. Mariah Milling   8. DVT Prophylaxis- Heparin gtt provided during the hospital course  DISCHARGE CONDITIONS:   stable  CONSULTS OBTAINED:  Treatment Team:  Annice Needy, MD   PROCEDURES  NONE   DRUG ALLERGIES:  No Known Allergies  DISCHARGE MEDICATIONS:   Allergies as of 11/23/2017   No Known Allergies     Medication List    STOP taking  these medications   amLODipine 10 MG tablet Commonly known as:  NORVASC   guaiFENesin 600 MG 12 hr tablet Commonly known as:  MUCINEX   lisinopril 20 MG tablet Commonly known as:  PRINIVIL,ZESTRIL   RA ASPIRIN EC 81 MG EC tablet Generic drug:  aspirin     TAKE these medications   atorvastatin 10 MG tablet Commonly known as:  LIPITOR take 1 tablet by mouth once daily What changed:  Another medication with the same name was added. Make sure you understand how and when to take each.   atorvastatin 10 MG tablet Commonly known as:  LIPITOR TAKE 1 TABLET (10 MG TOTAL) BY MOUTH DAILY. What changed:  You were already taking a medication with the same name, and this prescription was added. Make sure you understand how and when to take each.   chlorpheniramine-HYDROcodone 10-8 MG/5ML Suer Commonly known as:  TUSSIONEX PENNKINETIC ER Take 5 mLs by mouth at bedtime as needed.   fluticasone 50 MCG/ACT nasal spray Commonly known as:  FLONASE Place 2 sprays into both nostrils daily.   levofloxacin 500 MG tablet Commonly known as:  LEVAQUIN Take 1 tablet (500 mg total) by mouth daily. Start taking on:  11/24/2017 What changed:    medication strength  how much to take   metFORMIN 1000 MG tablet Commonly known as:  GLUCOPHAGE Take 1 tablet (1,000 mg total) by mouth 2 (two) times daily with a meal.   metoprolol tartrate 50 MG tablet Commonly known as:  LOPRESSOR TAKE 1 TABLET (50 MG TOTAL) BY MOUTH 2 (TWO) TIMES DAILY.   Rivaroxaban 15 MG Tabs tablet Commonly known as:  XARELTO Take 1 tablet (15 mg total) by mouth 2 (two) times daily with a meal.   rivaroxaban 20 MG Tabs tablet Commonly known as:  XARELTO Take 1 tablet (20 mg total) by mouth daily with supper. Start taking on:  12/14/2017        DISCHARGE INSTRUCTIONS:   Follow-up with primary care physician in a week  follow-up with cardiology Dr. Mariah Milling in 1 week Follow-up with vascular surgery Dr. Gilda Crease in 1  week   DIET:  Cardiac diet and Diabetic diet  DISCHARGE CONDITION:  Stable  ACTIVITY:  Activity as tolerated  OXYGEN:  Home Oxygen: No.   Oxygen Delivery: room air  DISCHARGE LOCATION:  home   If you experience worsening of your admission symptoms, develop shortness of breath, life threatening emergency, suicidal or homicidal thoughts you must seek medical attention immediately by calling 911 or calling your MD immediately  if symptoms less severe.  You Must read complete instructions/literature along with all the possible adverse reactions/side effects for all the Medicines you take and that have been prescribed to you. Take any new Medicines after you have completely understood and accpet all the possible adverse reactions/side effects.   Please note  You were cared for by a hospitalist during your hospital stay. If you have any questions about your discharge medications or the care you received while you were in the hospital after you are discharged, you can call the unit and asked to speak with the hospitalist on call if the hospitalist that took  care of you is not available. Once you are discharged, your primary care physician will handle any further medical issues. Please note that NO REFILLS for any discharge medications will be authorized once you are discharged, as it is imperative that you return to your primary care physician (or establish a relationship with a primary care physician if you do not have one) for your aftercare needs so that they can reassess your need for medications and monitor your lab values.     Today  Chief Complaint  Patient presents with  . Loss of Consciousness   Patient is doing fine.  Denies any complaints.  Denies any shortness of breath or palpitations.  Left leg pain is better  ROS:  CONSTITUTIONAL: Denies fevers, chills. Denies any fatigue, weakness.  EYES: Denies blurry vision, double vision, eye pain. EARS, NOSE, THROAT: Denies  tinnitus, ear pain, hearing loss. RESPIRATORY: Denies cough, wheeze, shortness of breath.  CARDIOVASCULAR: Denies chest pain, palpitations, edema.  GASTROINTESTINAL: Denies nausea, vomiting, diarrhea, abdominal pain. Denies bright red blood per rectum. GENITOURINARY: Denies dysuria, hematuria. ENDOCRINE: Denies nocturia or thyroid problems. HEMATOLOGIC AND LYMPHATIC: Denies easy bruising or bleeding. SKIN: Denies rash or lesion. MUSCULOSKELETAL: Denies pain in neck, back, shoulder, knees, hips or arthritic symptoms.  NEUROLOGIC: Denies paralysis, paresthesias.  PSYCHIATRIC: Denies anxiety or depressive symptoms.   VITAL SIGNS:  Blood pressure 114/73, pulse 71, temperature 98.2 F (36.8 C), temperature source Oral, resp. rate 18, height 5\' 11"  (1.803 m), weight 121.5 kg (267 lb 14.4 oz), SpO2 96 %.  I/O:    Intake/Output Summary (Last 24 hours) at 11/23/2017 1402 Last data filed at 11/23/2017 0900 Gross per 24 hour  Intake 5145.71 ml  Output 1700 ml  Net 3445.71 ml    PHYSICAL EXAMINATION:  GENERAL:  67 y.o.-year-old patient lying in the bed with no acute distress.  EYES: Pupils equal, round, reactive to light and accommodation. No scleral icterus. Extraocular muscles intact.  HEENT: Head atraumatic, normocephalic. Oropharynx and nasopharynx clear.  NECK:  Supple, no jugular venous distention. No thyroid enlargement, no tenderness.  LUNGS: Normal breath sounds bilaterally, no wheezing, rales,rhonchi or crepitation. No use of accessory muscles of respiration.  CARDIOVASCULAR: S1, S2 normal. No murmurs, rubs, or gallops.  ABDOMEN: Soft, non-tender, non-distended. Bowel sounds present. No organomegaly or mass.  EXTREMITIES: No calf tenderness but left lower extremity is warm to touch and edematous no pedal edema, cyanosis, or clubbing.  NEUROLOGIC: Cranial nerves II through XII are intact. Muscle strength 5/5 in all extremities. Sensation intact. Gait not checked.  PSYCHIATRIC: The  patient is alert and oriented x 3.  SKIN: No obvious rash, lesion, or ulcer.   DATA REVIEW:   CBC Recent Labs  Lab 11/23/17 0447  WBC 8.6  HGB 14.1  HCT 41.0  PLT 111*    Chemistries  Recent Labs  Lab 11/20/17 1552  11/23/17 0447  NA 133*   < > 133*  K 5.0   < > 4.2  CL 100*   < > 106  CO2 20*   < > 18*  GLUCOSE 288*   < > 147*  BUN 37*   < > 19  CREATININE 2.06*   < > 1.18  CALCIUM 8.8*   < > 8.2*  AST 21  --   --   ALT 12*  --   --   ALKPHOS 77  --   --   BILITOT 1.9*  --   --    < > = values  in this interval not displayed.    Cardiac Enzymes Recent Labs  Lab 11/20/17 1552  TROPONINI <0.03    Microbiology Results  Results for orders placed or performed in visit on 09/01/15  Microscopic Examination     Status: None   Collection Time: 09/01/15  9:41 AM  Result Value Ref Range Status   WBC, UA None seen 0 - 5 /hpf Final   RBC, UA None seen 0 - 2 /hpf Final   Epithelial Cells (non renal) None seen 0 - 10 /hpf Final    RADIOLOGY:  US Venous Img Lower Unilateral Left  Result Date: 11/22/2017 CLINICAL DATA:  Left lower extremity pain and edema for the past 2-3 days. History of prior DVT and IVC filter placement. Evaluate for acute or chronic DVT. EXAM: LEFT LOWER EXTREMITY VENOUS DOPPLER ULTRASOUND TECHNIQUE: Gray-scale sonography with graded compression, as well as color Doppler and duplex ultrasound were performed to evaluate the lower extremity deep venous systems from the level of the common femoral vein and including the common femoral, femoral, profunda femoral, popliteal and calf veins including the posterior tibial, peroneal and gastrocnemius veins when visible. The superficial great saphenous vein was also interrogated. Spectral Doppler was utilized to evaluate flow at rest and with distal augmentation maneuvers in the common femoral, femoral and popliteal veins. COMPARISON:  None. FINDINGS: Contralateral Common Femoral Vein: Respiratory phasicity is normal  and symmetric with the symptomatic side. No evidence of acute or chronic thrombus. Normal compressibility. There is hypoechoic near occlusive thrombus within the left common femoral vein (images 6 and 7. The saphenofemoral junction as well as the greater saphenous vein appear patent where imaged (images 10 and 12). There is hypoechoic expansile occlusive thrombus involving the imaged portions of the left deep femoral (images 14 - 16). There is mixed echogenic occlusive thrombus seen throughout the proximal (image 18), mid (image 21) and distal (image 25) aspects of the left femoral vein. There is hypoechoic expansile thrombus involving the proximal (image 27) and distal (image 31) aspects of the left popliteal vein. Duplicated posterior tibial veins appear patent where imaged. The peroneal vein was not visualized. Other Findings:  None. IMPRESSION: Examination is positive for extensive predominantly occlusive DVT extending from the left common femoral through the left popliteal vein. Given history of prior DVT and in the absence of prior examinations, the chronicity of these findings is indeterminate, though given the expansile nature of the thrombus, the majority of the DVT is favored to be acute in etiology. Clinical correlation is advised. These results will be called to the ordering clinician or representative by the Radiologist Assistant, and communication documented in the PACS or zVision Dashboard. Electronically Signed   By: Simonne Come M.D.   On: 11/22/2017 15:15   Dg Chest Portable 1 View  Result Date: 11/20/2017 CLINICAL DATA:  History of heart failure with increasing weakness last week. EXAM: PORTABLE CHEST 1 VIEW COMPARISON:  February 23, 2012 FINDINGS: The mediastinal contour is normal. The heart size is mildly enlarged. There is no focal infiltrate, pulmonary edema, or pleural effusion. The visualized skeletal structures are unremarkable. IMPRESSION: No active cardiopulmonary disease. Electronically  Signed   By: Sherian Rein M.D.   On: 11/20/2017 16:22    EKG:   Orders placed or performed during the hospital encounter of 11/20/17  . ED EKG  . ED EKG  . EKG 12-Lead  . EKG 12-Lead      Management plans discussed with the patient, family and they are  in agreement.  CODE STATUS:     Code Status Orders  (From admission, onward)        Start     Ordered   11/20/17 1822  Full code  Continuous     11/20/17 1821    Code Status History    Date Active Date Inactive Code Status Order ID Comments User Context   This patient has a current code status but no historical code status.      TOTAL TIME TAKING CARE OF THIS PATIENT: 45  minutes.   Note: This dictation was prepared with Dragon dictation along with smaller phrase technology. Any transcriptional errors that result from this process are unintentional.   @[facility] @  on 11/23/2017 at 2:02 PM  Between 7am to 6pm - Pager - [phone]  After 6pm go to [email or web address] - password [email or web address] [facility]  Office  [phone]  CC: Primary care physician; Steele Sizerrissman, Mark A, MD

## 2017-11-23 NOTE — Discharge Instructions (Signed)
Follow-up with primary care physician in a week  follow-up with cardiology Dr. Mariah MillingGollan in 1 week Follow-up with vascular surgery Dr. Gilda CreaseSchnier in 1 week

## 2017-11-23 NOTE — Progress Notes (Signed)
ANTICOAGULATION CONSULT NOTE - Initial Consult  Pharmacy Consult for Heparin Drip Indication: DVT  No Known Allergies  Patient Measurements: Height: 5\' 11"  (180.3 cm) Weight: 267 lb 14.4 oz (121.5 kg) IBW/kg (Calculated) : 75.3 Heparin Dosing Weight: 102.3 kg  Vital Signs: Temp: 98.3 F (36.8 C) (01/16 0348) Temp Source: Oral (01/16 0348) BP: 111/74 (01/16 0348) Pulse Rate: 88 (01/16 0348)  Labs: Recent Labs    11/20/17 1552 11/21/17 0310 11/22/17 0554 11/22/17 1604 11/22/17 2253 11/23/17 0447  HGB 16.2 15.1  --   --   --  14.1  HCT 49.1 45.7  --   --   --  41.0  PLT 143* 103*  --   --   --  111*  APTT  --   --   --  29  --   --   LABPROT  --   --   --  14.4  --   --   INR  --   --   --  1.13  --   --   HEPARINUNFRC  --   --   --   --  0.62 0.58  CREATININE 2.06* 1.71* 1.57*  --   --  1.18  TROPONINI <0.03  --   --   --   --   --     Estimated Creatinine Clearance: 81.7 mL/min (by C-G formula based on SCr of 1.18 mg/dL).   Medical History: Past Medical History:  Diagnosis Date  . Atrial fibrillation (HCC)   . Chronic low back pain   . Chronic systolic heart failure (HCC)    with EF of 40-45% by echocardiogram, liekly tachycardia induced cariomyopathy  . Diabetes mellitus without complication (HCC)   . DVT of lower limb, acute (HCC) 2014   off anti-coagulation  . Erectile dysfunction   . HLD (hyperlipidemia)   . Hypertension   . Hypogonadism in male     Medications:  Scheduled:  . aspirin EC  81 mg Oral Daily  . atorvastatin  10 mg Oral Daily  . fluticasone  2 spray Each Nare Daily  . guaiFENesin  600 mg Oral BID  . insulin aspart  0-5 Units Subcutaneous QHS  . insulin aspart  0-9 Units Subcutaneous TID WC  . insulin aspart  2 Units Subcutaneous TID WC  . levofloxacin  500 mg Oral Daily  . metoprolol tartrate  50 mg Oral BID   Infusions:  . sodium chloride 100 mL/hr at 11/23/17 0346  . heparin 1,750 Units/hr (11/23/17 0346)    Assessment: 67  yo M with + DVT. Hx of DVT in the past and had IVC filter. Patient on aspirin PTA.  Hgb 15.1  Plt 103  INR 1.13  APTT 29  (Patient did receive Lovenox 40 mg on 1/14 at 2119)   Goal of Therapy:  Heparin level 0.3-0.7 units/ml Monitor platelets by anticoagulation protocol: Yes   Plan:  Give 5000 units bolus x 1 Start heparin infusion at 1750 units/hr Check anti-Xa level in 6 hours and daily while on heparin Continue to monitor H&H and platelets   01/15 @ 2300 HL 0.62 therapeutic. Will continue current rate and will recheck HL/CBC w/ am labs.  01/16 @ 0500 HL 0.58 therapeutic. Will continue current rate and will recheck w/ am labs.   Thomasene Rippleavid Jahzara Slattery, PharmD, BCPS Clinical Pharmacist 11/23/2017

## 2017-11-23 NOTE — Progress Notes (Signed)
Patient discharged home with spouse. Coupon given for Xarelto. Discharge instructions and follow up appointments reviewed. IV removed without complication. Patient discharged via w/c by volunteer.

## 2017-11-24 ENCOUNTER — Telehealth: Payer: Self-pay | Admitting: Cardiovascular Disease

## 2017-11-24 ENCOUNTER — Telehealth (INDEPENDENT_AMBULATORY_CARE_PROVIDER_SITE_OTHER): Payer: Self-pay | Admitting: Vascular Surgery

## 2017-11-24 ENCOUNTER — Ambulatory Visit: Payer: Self-pay | Admitting: *Deleted

## 2017-11-24 NOTE — Telephone Encounter (Signed)
Patient stated that he was having pain in his groin area but he stated that the pain was less while he was sitting in his recliner I inform him that if the pain was getting worse for him to go to the ER to evaluated.Patient states that he had a ivc filter place 5615yrs in that area.He was offered 11/28/17 to come in but he will be keeping his appointment on 12/01/17

## 2017-11-24 NOTE — Telephone Encounter (Signed)
New Message  Pt verbalized he is in a lot of pain.   Pt was in ED 1.13.19, with blood clot and not pt is in a lot pain in groin area and they stated they are needing an emergent appt.  Offered 1.21.19, pt verbalized that's too far away and wants to speak to GS abt being seen sooner than appt for next week.  Please f/u

## 2017-11-24 NOTE — Telephone Encounter (Signed)
Patient was discharged yesterday, patient not TCM but told to f/u Scheduled 2/1 Patient says he is having a lot of issues with heart rate and needs to be seen sooner Please call to discuss - best number to contact [phone]

## 2017-11-24 NOTE — Telephone Encounter (Signed)
Pt recently discharged for syncope and hypotension found to have LLE DVT while admitted.  He received IV heparin and discharged yesterday on xarelto 15mg  BID.  Pain has now moved from left knee to left groin, causing difficulty walking.   Heart races with ambulation; unsure of HR. He has an IVC filter.  Denies SOB or any other sx other than pain in left groin and elevated HR with ambulation. After discussion with patient and [name], PA-C, advised pt to go to the ED now for evaluation.  Pt agreeable.   Appt moved to January 23, 2:20pm

## 2017-11-24 NOTE — Telephone Encounter (Signed)
Called in c/o left groin pain that is so bad he can hardly walk and it's "making my heart rate go up".   He was just discharged from the hospital and while there developed a blood clot in his left knee.   He also mentioned he has a filter for blood clots that was put in 2010.  He started Arlo last night.   He called his cardiologist prior to calling Dr. Christell Faithrissman's office but they could  Not see him today.  I informed him he needed to go to the ED now.  I touched base with Dr. Christell Faithrissman's flow coordinator at the pt's request before going to the ED.   They also said he needed to go to the ED.  I relayed this to the pt.  He is going to [facility] ED now.  I routed a note to Dr. Christell Faithrissman's nurse pool letting know of his situation even though I just spoke with them.

## 2017-11-25 ENCOUNTER — Ambulatory Visit: Payer: Medicare HMO | Admitting: Cardiovascular Disease

## 2017-11-25 ENCOUNTER — Encounter: Payer: Self-pay | Admitting: Cardiovascular Disease

## 2017-11-25 ENCOUNTER — Telehealth: Payer: Self-pay

## 2017-11-25 VITALS — BP 100/80 | HR 115 | Ht 71.0 in | Wt 276.5 lb

## 2017-11-25 DIAGNOSIS — I420 Dilated cardiomyopathy: Secondary | ICD-10-CM | POA: Diagnosis not present

## 2017-11-25 DIAGNOSIS — E1159 Type 2 diabetes mellitus with other circulatory complications: Secondary | ICD-10-CM | POA: Diagnosis not present

## 2017-11-25 DIAGNOSIS — I480 Paroxysmal atrial fibrillation: Secondary | ICD-10-CM | POA: Diagnosis not present

## 2017-11-25 DIAGNOSIS — E782 Mixed hyperlipidemia: Secondary | ICD-10-CM

## 2017-11-25 DIAGNOSIS — I10 Essential (primary) hypertension: Secondary | ICD-10-CM | POA: Diagnosis not present

## 2017-11-25 MED ORDER — DIGOXIN 250 MCG PO TABS: 0.2500 mg | ORAL_TABLET | Freq: Every day | ORAL | 3 refills | Status: DC

## 2017-11-25 MED ORDER — RIVAROXABAN 20 MG PO TABS: 20.0000 mg | ORAL_TABLET | Freq: Every day | ORAL | 11 refills | Status: DC

## 2017-11-25 NOTE — Patient Instructions (Addendum)
Medication Instructions:   Please start digoxin 2 pills the first day Then one pill a day for speed control  Xarelto 15 mg twice a day for three weeks After three weeks, then 20 mg daily   Medication Samples have been provided to the patient.  Drug name: Xarelto       Strength: 20 mg        Qty: 4 bottles  LOT: [id number]  Exp.Date: 3/21   Labwork:  If you are still on digoxin in 2 weeks Call the office for lab work, digoxin level  Testing/Procedures:  No further testing at this time   Follow-Up: It was a pleasure seeing you in the office today. Please call us if you have new issues that need to be addressed before your next appt.  [phone]  Your physician wants you to follow-up in: 3 months.  You will receive a reminder letter in the mail two months in advance. If you don't receive a letter, please call our office to schedule the follow-up appointment.  If you need a refill on your cardiac medications before your next appointment, please call your pharmacy.   You have been referred to Dr. Marijean HeathSchnier's office to see Cleda DaubKimberly Stegmayer PA  11/28/17 10:45 AM

## 2017-11-25 NOTE — Telephone Encounter (Signed)
.  Transition Care Management Follow-up Telephone Call   Date discharged? 11/23/2017   How have you been since you were released from the hospital? "still in a little pain, seeing cardiologist today"   Do you understand why you were in the hospital? yes   Do you understand the discharge instructions? yes   Where were you discharged to? home   Items Reviewed:  Medications reviewed: yes  Allergies reviewed: yes  Dietary changes reviewed: yes  Referrals reviewed: yes   Functional Questionnaire:   Activities of Daily Living (ADLs):   He states they are independent in the following: ambulation, bathing and hygiene, feeding, continence, grooming, toileting and dressing States they require assistance with the following: none   Any transportation issues/concerns?: no   Any patient concerns? No, "seeing cardiologist today and hoping xarelto has started working"   Confirmed importance and date/time of follow-up visits scheduled yes  Provider Appointment booked with Dr.Crissman on 12/08/2017 at 2:30pm.  Confirmed with patient if condition begins to worsen call PCP or go to the ER.  Patient was given the office number and encouraged to call back with question or concerns.  : yes

## 2017-11-25 NOTE — Telephone Encounter (Signed)
I have made the 1st attempt to contact the patient or family member in charge, in order to follow up from recently being discharged from the hospital. I left a message on voicemail but I will make another attempt at a different time.  

## 2017-11-25 NOTE — Progress Notes (Signed)
Cardiology Office Note  Date:  11/25/2017   ID:  Jeff [name], DOB [birth year], MRN [id number]  PCP:  Steele Sizerrissman, Mark A, MD   Chief Complaint  Patient presents with  . other    Follow up from [facility]; A-Fib, syncope due to dehydration and DVT. Meds reviewed by the pt. verbally. Pt. c/o pain in his groin due to blood clot; is on xarelto and has a follow up with Dr. Wyn Quakerew next Thursday.     HPI:  Jeff Buttsed Kings is a 67 y/o male with h/o  HTN,  DM2  hemoglobin A1c 7.7 DVT LLE 2010, with filter hyperlipidemia   April 2011  atrial fibrillation with RVR,  emergency room on April 15 2010 for rapid atrial fibrillation,  s/p cardioversion   lower extremity edema On calcium channel blockers,    Echo in 02/2010 shows EF 40-45%,  possibly tachycardia induced  Converted back to atrial fibrillation in 2016, at that time did not want cardioversion  he presents for routine followup for chronic atrial fibrillation.  In follow-up today he reports that he was not taking xarelto for his atrial fibrillation as we recommended on his last clinic visit  He developed severe sinusitis/URI , weight loss over 2 weeks, bedrest  Became tremendously weak requiring hospitalization  early January 2019 Diagnosed with dehydration, renal failure, syncope, left lower extremity DVT Hospital records reviewed with the patient in detail Syncope in the setting of not eating well, weakness Slid to the floor after getting weak in the knees, feels he was out for several seconds, hypotensive in the emergency room, improved with IV fluids Started on heparin infusion for DVT left lower extremity  thrombus was occlusive left SFA down to popliteal   IVC filter in place from 2010 Is not seen by vascular surgery at the time  Shortly after he was discharged home he developed severe pain in his groin Unable to ambulate very far without severe pain Feels like he has been kicked in the groin Presents today in a wheelchair Has been taking  Xarelto 50 mg twice a day since discharge last Wednesday, 2 days ago  Scheduled to see vascular surgery next week on Thursday, January 24 We have called and they have new appointment January 21  Lab work reviewed, hematocrit 41   creatinine 1.57 which is well above his baseline  EKG personally reviewed by myself on todays visit Shows atrial fibrillation with ventricular rate 115 bpm no significant ST or T-wave changes  Other past medical history reviewed  Echo in 02/2010 shows EF 40-45% which was felt to be tachycardia induced.  Prior lab work LDL 110, hemoglobin A1c 9.1, HDL 36, total cholesterol 409169     PMH:   has a past medical history of Atrial fibrillation (HCC), Chronic low back pain, Chronic systolic heart failure (HCC), Diabetes mellitus without complication (HCC), DVT of lower limb, acute (HCC) (2014), Erectile dysfunction, HLD (hyperlipidemia), Hypertension, and Hypogonadism in male.  PSH:    Past Surgical History:  Procedure Laterality Date  . COLONOSCOPY WITH PROPOFOL N/A 05/28/2016   Procedure: COLONOSCOPY WITH PROPOFOL;  Surgeon: Midge Miniumarren Wohl, MD;  Location: [facility];  Service: Endoscopy;  Laterality: N/A;  diabetic - oral meds  . DVT, leg Left   . INSERTION OF VENA CAVA FILTER  2014    Current Outpatient Medications  Medication Sig Dispense Refill  . atorvastatin (LIPITOR) 10 MG tablet take 1 tablet by mouth once daily 90 tablet 4  . chlorpheniramine-HYDROcodone (TUSSIONEX PENNKINETIC  ER) 10-8 MG/5ML SUER Take 5 mLs by mouth at bedtime as needed. 140 mL 0  . fluticasone (FLONASE) 50 MCG/ACT nasal spray Place 2 sprays into both nostrils daily. 16 g 11  . levofloxacin (LEVAQUIN) 500 MG tablet Take 1 tablet (500 mg total) by mouth daily. 3 tablet 0  . metFORMIN (GLUCOPHAGE) 1000 MG tablet Take 1 tablet (1,000 mg total) by mouth 2 (two) times daily with a meal. 180 tablet 4  . metoprolol tartrate (LOPRESSOR) 50 MG tablet TAKE 1 TABLET (50 MG TOTAL) BY MOUTH 2  (TWO) TIMES DAILY. 180 tablet 0  . Rivaroxaban (XARELTO) 15 MG TABS tablet Take 1 tablet (15 mg total) by mouth 2 (two) times daily with a meal. 42 tablet 0  . [START ON 12/14/2017] rivaroxaban (XARELTO) 20 MG TABS tablet Take 1 tablet (20 mg total) by mouth daily with supper. 30 tablet 0   No current facility-administered medications for this visit.      Allergies:   Patient has no known allergies.   Social History:  The patient  reports that  has never smoked. he has never used smokeless tobacco. He reports that he does not drink alcohol or use drugs.   Family History:   family history includes Diabetes in his father; Diabetes Mellitus II in his unknown relative; Heart failure in his father; Lymphoma in his mother.    Review of Systems: Review of Systems  Constitutional: Negative.   Respiratory: Negative.   Cardiovascular: Positive for leg swelling.  Gastrointestinal: Negative.   Musculoskeletal: Negative.   Neurological: Negative.   Psychiatric/Behavioral: Negative.   All other systems reviewed and are negative.    PHYSICAL EXAM: VS:  BP 100/80 (BP Location: Left Arm, Patient Position: Sitting, Cuff Size: Normal)   Pulse (!) 115   Ht 5\' 11"  (1.803 m)   Wt 276 lb 8 oz (125.4 kg)   BMI 38.56 kg/m  , BMI Body mass index is 38.56 kg/m. GEN: Well nourished, well developed, in no acute distress , obese HEENT: normal  Neck: no JVD, carotid bruits, or masses Cardiac: Irregularly irregular, tachycardic no murmurs, rubs, or gallops,no edema  Respiratory:  clear to auscultation bilaterally, normal work of breathing GI: soft, nontender, nondistended, + BS MS: no deformity or atrophy  Skin: warm and dry, no rash Neuro:  Strength and sensation are intact Psych: euthymic mood, full affect    Recent Labs: 11/20/2017: ALT 12; TSH 2.638 11/23/2017: BUN 19; Creatinine, Ser 1.18; Hemoglobin 14.1; Platelets 111; Potassium 4.2; Sodium 133    Lipid Panel Lab Results  Component Value  Date   CHOL 115 12/20/2016   HDL 32 (L) 12/20/2016   LDLCALC 37 12/20/2016   TRIG 229 (H) 12/20/2016      Wt Readings from Last 3 Encounters:  11/25/17 276 lb 8 oz (125.4 kg)  11/20/17 267 lb 14.4 oz (121.5 kg)  11/18/17 266 lb 11.2 oz (121 kg)       ASSESSMENT AND PLAN:  Congestive dilated cardiomyopathy (HCC) - Plan: EKG 12-Lead 40-45% ejection fraction in 2011  Likely tachycardia mediated cardiomyopathy   rate elevated on today's visit Digoxin added for rate control  Permanent atrial fibrillation (HCC) - Plan: EKG 12-Lead Rate elevated secondary to recent groin pain from DVT We'll add digoxin 0.25 mg daily Stay on xarelto   HYPERTENSION, BENIGN - Plan: EKG 12-Lead Low blood pressure likely secondary to tachycardia  unable to advance his metoprolol.  Acute DVT Prior history of DVT 2010 with filter placed  at that time Recurrent DVT in the setting of being bedbound with upper respiratory tract infection, sinusitis He does have IVC filter in place Scheduled to see vascular surgery Monday, January 21 Suspect he will need thrombectomy given groin pain, immobility Pain contribute to tachycardia, difficult to control atrial fibrillation rate Occluded vessel left femoral down to popliteal  Mixed hyperlipidemia - Plan: EKG 12-Lead Cholesterol is at goal on the current lipid regimen. No changes to the medications were made.  Type 2 diabetes mellitus with other circulatory complication, without long-term current use of insulin (HCC) -  Hemoglobin A1c 8.0   recommended weight loss, low carbohydrates Prior history of good activity level  Encounter for anticoagulation discussion and counseling Samples provided of Xarelto   including coupon stressed importance of staying on anticoagulation given DVT and atrial fibrillation   Total encounter time more than 45 minutes  Greater than 50% was spent in counseling and coordination of care with the patient   Disposition:   F/U   3 months   Orders Placed This Encounter  Procedures  . EKG 12-Lead     Signed, Dossie Arbour, M.D., Ph.D. 11/25/2017  [facility] [place], [place] [phone]

## 2017-11-28 ENCOUNTER — Encounter (INDEPENDENT_AMBULATORY_CARE_PROVIDER_SITE_OTHER): Payer: Self-pay | Admitting: Vascular Surgery

## 2017-11-28 ENCOUNTER — Other Ambulatory Visit (INDEPENDENT_AMBULATORY_CARE_PROVIDER_SITE_OTHER): Payer: Self-pay | Admitting: Vascular Surgery

## 2017-11-28 ENCOUNTER — Encounter (INDEPENDENT_AMBULATORY_CARE_PROVIDER_SITE_OTHER): Payer: Self-pay

## 2017-11-28 ENCOUNTER — Ambulatory Visit (INDEPENDENT_AMBULATORY_CARE_PROVIDER_SITE_OTHER): Payer: Medicare HMO | Admitting: Vascular Surgery

## 2017-11-28 VITALS — BP 114/82 | HR 98 | Resp 16 | Ht 71.0 in | Wt 276.0 lb

## 2017-11-28 DIAGNOSIS — I82412 Acute embolism and thrombosis of left femoral vein: Secondary | ICD-10-CM

## 2017-11-28 DIAGNOSIS — E1159 Type 2 diabetes mellitus with other circulatory complications: Secondary | ICD-10-CM

## 2017-11-28 DIAGNOSIS — E782 Mixed hyperlipidemia: Secondary | ICD-10-CM | POA: Diagnosis not present

## 2017-11-28 DIAGNOSIS — I10 Essential (primary) hypertension: Secondary | ICD-10-CM

## 2017-11-28 MED ORDER — TRAMADOL HCL 50 MG PO TABS: ORAL_TABLET | ORAL | 0 refills | Status: DC

## 2017-11-28 NOTE — Progress Notes (Signed)
Subjective:    Patient ID: Jeff [name], male    DOB: May 13, 1951, 67 y.o.   MRN: [id number] Chief Complaint  Patient presents with  . New Patient (Initial Visit)    [facility] 1week follow up   Patient presents a week sooner than his originally scheduled appointment at the request of Dr. Mariah Milling for evaluation of a left lower extremity DVT.  The patient endorses a history with a severe sinus infection for which he was so sick he was on bedrest for approximately 2 weeks.  The patient states that he became very dehydrated and his "kidneys failed" and was briefly treated as an inpatient early July 2019.  Of note, the patient has a past medical history of atrial fibrillation for which he was prescribed Xarelto however was only taking 81 mg aspirin.  He was diagnosed with a DVT as an inpatient.  Heparin drip was started then transitioned to Xarelto and the patient was discharged home with a scheduled follow-up in our office.  The patient has been experiencing progressively worsening "groin pain" since his discharge.  The patient does note his left lower extremity is "swollen".  He states it feels that "there is a "10 pound weight on his scrotum".  Patient denies any discoloration ulcer formation to the left lower extremity.  Patient denies any fever, nausea vomiting.   Review of Systems  Constitutional: Negative.   HENT: Negative.   Eyes: Negative.   Respiratory: Negative.   Cardiovascular: Positive for leg swelling.       LLE DVT  Gastrointestinal: Negative.   Endocrine: Negative.   Genitourinary: Negative.   Musculoskeletal: Negative.   Skin: Negative.   Allergic/Immunologic: Negative.   Neurological: Negative.   Hematological: Negative.   Psychiatric/Behavioral: Negative.       Objective:   Physical Exam  Constitutional: He is oriented to person, place, and time. He appears well-developed and well-nourished. No distress.  HENT:  Head: Normocephalic and atraumatic.  Eyes: Conjunctivae  are normal. Pupils are equal, round, and reactive to light.  Neck: Normal range of motion.  Cardiovascular: Normal rate, regular rhythm, normal heart sounds and intact distal pulses.  Pulses:      Radial pulses are 2+ on the right side, and 2+ on the left side.       Dorsalis pedis pulses are 2+ on the right side.       Posterior tibial pulses are 2+ on the right side.  Hard to feel pedal pulses to the left lower extremity due to edema however his bilateral feet are warm.  There is no acute compromise to the left lower extremity.  Skin is intact.  There is no discoloration.  Pulmonary/Chest: Effort normal and breath sounds normal.  Musculoskeletal: Normal range of motion. He exhibits edema (Moderate 1+ pitting edema noted to the left lower extremity).  Neurological: He is alert and oriented to person, place, and time.  Skin: Skin is warm and dry. He is not diaphoretic.  Psychiatric: He has a normal mood and affect. His behavior is normal. Judgment and thought content normal.  Vitals reviewed.  BP 114/82 (BP Location: Right Arm)   Pulse 98   Resp 16   Ht 5\' 11"  (1.803 m)   Wt 276 lb (125.2 kg)   BMI 38.49 kg/m   Past Medical History:  Diagnosis Date  . Atrial fibrillation (HCC)   . Chronic low back pain   . Chronic systolic heart failure (HCC)    with EF of 40-45%  by echocardiogram, liekly tachycardia induced cariomyopathy  . Diabetes mellitus without complication (HCC)   . DVT of lower limb, acute (HCC) 2014   off anti-coagulation  . Erectile dysfunction   . HLD (hyperlipidemia)   . Hypertension   . Hypogonadism in male    Social History   Socioeconomic History  . Marital status: Divorced    Spouse name: Not on file  . Number of children: Not on file  . Years of education: Not on file  . Highest education level: Not on file  Social Needs  . Financial resource strain: Not on file  . Food insecurity - worry: Not on file  . Food insecurity - inability: Not on file  .  Transportation needs - medical: Not on file  . Transportation needs - non-medical: Not on file  Occupational History  . Not on file  Tobacco Use  . Smoking status: Never Smoker  . Smokeless tobacco: Never Used  Substance and Sexual Activity  . Alcohol use: No    Alcohol/week: 0.0 oz  . Drug use: No  . Sexual activity: Not on file  Other Topics Concern  . Not on file  Social History Narrative   Former Emergency planning/management officer    Independent at baseline.    Past Surgical History:  Procedure Laterality Date  . COLONOSCOPY WITH PROPOFOL N/A 05/28/2016   Procedure: COLONOSCOPY WITH PROPOFOL;  Surgeon: Midge Minium, MD;  Location: [facility];  Service: Endoscopy;  Laterality: N/A;  diabetic - oral meds  . DVT, leg Left   . INSERTION OF VENA CAVA FILTER  2014   Family History  Problem Relation Age of Onset  . Lymphoma Mother   . Heart failure Father   . Diabetes Father   . Diabetes Mellitus II Unknown   . Heart attack Neg Hx   . Hypertension Neg Hx   . Cancer Neg Hx   . COPD Neg Hx   . Stroke Neg Hx   . Prostate cancer Neg Hx   . Kidney cancer Neg Hx   . Bladder Cancer Neg Hx    No Known Allergies     Assessment & Plan:  Patient presents a week sooner than his originally scheduled appointment at the request of Dr. Mariah Milling for evaluation of a left lower extremity DVT.  The patient endorses a history with a severe sinus infection for which he was so sick he was on bedrest for approximately 2 weeks.  The patient states that he became very dehydrated and his "kidneys failed" and was briefly treated as an inpatient early July 2019.  Of note, the patient has a past medical history of atrial fibrillation for which he was prescribed Xarelto however was only taking 81 mg aspirin.  He was diagnosed with a DVT as an inpatient.  Heparin drip was started then transitioned to Xarelto and the patient was discharged home with a scheduled follow-up in our office.  The patient has been experiencing  progressively worsening "groin pain" since his discharge.  The patient does note his left lower extremity is "swollen".  He states it feels that "there is a "10 pound weight on his scrotum".  Patient denies any discoloration ulcer formation to the left lower extremity.  Patient denies any fever, nausea vomiting.  1. Acute deep vein thrombosis (DVT) of femoral vein of left lower extremity (HCC) - New Patient diagnosed with a left lower extremity occlusive femoral to popliteal DVT while an inpatient on November 22, 2017. The patient has  an IVC filter placed by Dr. Gilda CreaseSchnier in 2010 The patient is having progressively worsening left lower extremity pain especially located in the groin Patient with moderate 1+ pitting edema noted on exam.  There is no acute vascular compromise to the left lower extremity. Patient is on Xarelto 15 mg p.o. twice daily for 21 days transitioning to 20 mg daily after that. Tramadol 50 mg 1-2 tabs every 6 hours #60 written for pain control Recommend a left lower extremity venous lysis to assess anatomy and possibly improve the patient's symptoms Procedure, risks and benefits explained to the patient All questions answered The patient wishes to proceed  2. HYPERTENSION, BENIGN - Stable Encouraged good control as its slows the progression of atherosclerotic disease  3. Type 2 diabetes mellitus with other circulatory complication, without long-term current use of insulin (HCC) - Stable Encouraged good control as its slows the progression of atherosclerotic disease  4. Mixed hyperlipidemia - Stable Encouraged good control as its slows the progression of atherosclerotic disease  Current Outpatient Medications on File Prior to Visit  Medication Sig Dispense Refill  . atorvastatin (LIPITOR) 10 MG tablet take 1 tablet by mouth once daily 90 tablet 4  . chlorpheniramine-HYDROcodone (TUSSIONEX PENNKINETIC ER) 10-8 MG/5ML SUER Take 5 mLs by mouth at bedtime as needed. 140 mL 0  .  digoxin (LANOXIN) 0.25 MG tablet Take 1 tablet (0.25 mg total) by mouth daily. 30 tablet 3  . fluticasone (FLONASE) 50 MCG/ACT nasal spray Place 2 sprays into both nostrils daily. 16 g 11  . levofloxacin (LEVAQUIN) 500 MG tablet Take 1 tablet (500 mg total) by mouth daily. 3 tablet 0  . metFORMIN (GLUCOPHAGE) 1000 MG tablet Take 1 tablet (1,000 mg total) by mouth 2 (two) times daily with a meal. 180 tablet 4  . metoprolol tartrate (LOPRESSOR) 50 MG tablet TAKE 1 TABLET (50 MG TOTAL) BY MOUTH 2 (TWO) TIMES DAILY. 180 tablet 0  . Rivaroxaban (XARELTO) 15 MG TABS tablet Take 1 tablet (15 mg total) by mouth 2 (two) times daily with a meal. 42 tablet 0  . [START ON 12/14/2017] rivaroxaban (XARELTO) 20 MG TABS tablet Take 1 tablet (20 mg total) by mouth daily with supper. 30 tablet 11   No current facility-administered medications on file prior to visit.    There are no Patient Instructions on file for this visit. No Follow-up on file.  Tykera Skates A Aslan Montagna, PA-C

## 2017-11-30 MED ORDER — CEFAZOLIN SODIUM-DEXTROSE 2-4 GM/100ML-% IV SOLN
2.0000 g | Freq: Once | INTRAVENOUS | Status: AC
  Administered 2017-12-01: 2 g via INTRAVENOUS

## 2017-12-01 ENCOUNTER — Encounter (INDEPENDENT_AMBULATORY_CARE_PROVIDER_SITE_OTHER): Payer: Self-pay | Admitting: Vascular Surgery

## 2017-12-01 ENCOUNTER — Ambulatory Visit
Admission: RE | Admit: 2017-12-01 | Discharge: 2017-12-01 | Disposition: A | Discharge status: Home / Self Care | Payer: Medicare HMO | Source: Ambulatory Visit | Attending: Vascular Surgery | Admitting: Vascular Surgery

## 2017-12-01 ENCOUNTER — Encounter: Payer: Self-pay | Admitting: *Deleted

## 2017-12-01 ENCOUNTER — Inpatient Hospital Stay: Payer: Medicare HMO | Admitting: Family Medicine

## 2017-12-01 ENCOUNTER — Encounter
Admission: RE | Disposition: A | Discharge status: Home / Self Care | Payer: Self-pay | Source: Ambulatory Visit | Attending: Vascular Surgery

## 2017-12-01 DIAGNOSIS — Z807 Family history of other malignant neoplasms of lymphoid, hematopoietic and related tissues: Secondary | ICD-10-CM | POA: Diagnosis not present

## 2017-12-01 DIAGNOSIS — I110 Hypertensive heart disease with heart failure: Secondary | ICD-10-CM | POA: Diagnosis not present

## 2017-12-01 DIAGNOSIS — I82412 Acute embolism and thrombosis of left femoral vein: Secondary | ICD-10-CM | POA: Diagnosis not present

## 2017-12-01 DIAGNOSIS — Z9889 Other specified postprocedural states: Secondary | ICD-10-CM | POA: Diagnosis not present

## 2017-12-01 DIAGNOSIS — Z8249 Family history of ischemic heart disease and other diseases of the circulatory system: Secondary | ICD-10-CM | POA: Insufficient documentation

## 2017-12-01 DIAGNOSIS — E782 Mixed hyperlipidemia: Secondary | ICD-10-CM | POA: Insufficient documentation

## 2017-12-01 DIAGNOSIS — E1159 Type 2 diabetes mellitus with other circulatory complications: Secondary | ICD-10-CM | POA: Diagnosis not present

## 2017-12-01 DIAGNOSIS — N179 Acute kidney failure, unspecified: Secondary | ICD-10-CM

## 2017-12-01 DIAGNOSIS — Z833 Family history of diabetes mellitus: Secondary | ICD-10-CM | POA: Insufficient documentation

## 2017-12-01 DIAGNOSIS — I4891 Unspecified atrial fibrillation: Secondary | ICD-10-CM | POA: Insufficient documentation

## 2017-12-01 DIAGNOSIS — Z79899 Other long term (current) drug therapy: Secondary | ICD-10-CM | POA: Insufficient documentation

## 2017-12-01 DIAGNOSIS — I5022 Chronic systolic (congestive) heart failure: Secondary | ICD-10-CM | POA: Diagnosis not present

## 2017-12-01 DIAGNOSIS — N529 Male erectile dysfunction, unspecified: Secondary | ICD-10-CM | POA: Insufficient documentation

## 2017-12-01 DIAGNOSIS — I82402 Acute embolism and thrombosis of unspecified deep veins of left lower extremity: Secondary | ICD-10-CM | POA: Diagnosis not present

## 2017-12-01 DIAGNOSIS — Z7902 Long term (current) use of antithrombotics/antiplatelets: Secondary | ICD-10-CM | POA: Diagnosis not present

## 2017-12-01 HISTORY — PX: PERIPHERAL VASCULAR THROMBECTOMY: CATH118306

## 2017-12-01 LAB — CREATININE, SERUM
CREATININE: 1.44 mg/dL — AB (ref 0.61–1.24)
GFR calc Af Amer: 57 mL/min — ABNORMAL LOW (ref >60)
GFR, EST NON AFRICAN AMERICAN: 49 mL/min — AB (ref >60)

## 2017-12-01 LAB — BUN: BUN: 19 mg/dL (ref 6–20)

## 2017-12-01 LAB — GLUCOSE, CAPILLARY: Glucose-Capillary: 186 mg/dL — ABNORMAL HIGH (ref 65–99)

## 2017-12-01 SURGERY — PERIPHERAL VASCULAR THROMBECTOMY
Anesthesia: Moderate Sedation | Laterality: Left

## 2017-12-01 MED ORDER — ALTEPLASE 2 MG IJ SOLR
INTRAMUSCULAR | Status: AC
  Filled 2017-12-01: qty 2

## 2017-12-01 MED ORDER — ALTEPLASE 2 MG IJ SOLR
INTRAMUSCULAR | Status: AC
  Filled 2017-12-01: qty 12

## 2017-12-01 MED ORDER — HYDROMORPHONE HCL 1 MG/ML IJ SOLN: 1.0000 mg | Freq: Once | INTRAMUSCULAR | Status: DC | PRN

## 2017-12-01 MED ORDER — HEPARIN SODIUM (PORCINE) 1000 UNIT/ML IJ SOLN
INTRAMUSCULAR | Status: DC | PRN
  Administered 2017-12-01: 3000 [IU] via INTRAVENOUS

## 2017-12-01 MED ORDER — IOPAMIDOL (ISOVUE-300) INJECTION 61%
INTRAVENOUS | Status: DC | PRN
  Administered 2017-12-01: 25 mL via INTRAVENOUS

## 2017-12-01 MED ORDER — ONDANSETRON HCL 4 MG/2ML IJ SOLN: 4.0000 mg | Freq: Four times a day (QID) | INTRAMUSCULAR | Status: DC | PRN

## 2017-12-01 MED ORDER — FENTANYL CITRATE (PF) 100 MCG/2ML IJ SOLN
INTRAMUSCULAR | Status: DC | PRN
  Administered 2017-12-01 (×2): 50 ug via INTRAVENOUS

## 2017-12-01 MED ORDER — SODIUM CHLORIDE 0.9 % IV SOLN
INTRAVENOUS | Status: DC
  Administered 2017-12-01: 11:00:00 via INTRAVENOUS

## 2017-12-01 MED ORDER — FENTANYL CITRATE (PF) 100 MCG/2ML IJ SOLN
INTRAMUSCULAR | Status: AC
  Filled 2017-12-01: qty 2

## 2017-12-01 MED ORDER — HEPARIN (PORCINE) IN NACL 2-0.9 UNIT/ML-% IJ SOLN
INTRAMUSCULAR | Status: AC
  Filled 2017-12-01: qty 1000

## 2017-12-01 MED ORDER — LIDOCAINE HCL (PF) 1 % IJ SOLN
INTRAMUSCULAR | Status: AC
  Filled 2017-12-01: qty 30

## 2017-12-01 MED ORDER — SODIUM CHLORIDE 0.9 % IV BOLUS (SEPSIS)
250.0000 mL | Freq: Once | INTRAVENOUS | Status: AC
  Administered 2017-12-01: 250 mL via INTRAVENOUS

## 2017-12-01 MED ORDER — METHYLPREDNISOLONE SODIUM SUCC 125 MG IJ SOLR: 125.0000 mg | INTRAMUSCULAR | Status: DC | PRN

## 2017-12-01 MED ORDER — HEPARIN SODIUM (PORCINE) 1000 UNIT/ML IJ SOLN
INTRAMUSCULAR | Status: AC
  Filled 2017-12-01: qty 1

## 2017-12-01 MED ORDER — MIDAZOLAM HCL 2 MG/2ML IJ SOLN
INTRAMUSCULAR | Status: DC | PRN
  Administered 2017-12-01 (×2): 2 mg via INTRAVENOUS

## 2017-12-01 MED ORDER — RIVAROXABAN 15 MG PO TABS
15.0000 mg | ORAL_TABLET | Freq: Once | ORAL | Status: AC
  Administered 2017-12-01: 15 mg via ORAL
  Filled 2017-12-01: qty 1

## 2017-12-01 MED ORDER — MIDAZOLAM HCL 5 MG/5ML IJ SOLN
INTRAMUSCULAR | Status: AC
  Filled 2017-12-01: qty 5

## 2017-12-01 MED ORDER — FAMOTIDINE 20 MG PO TABS: 40.0000 mg | ORAL_TABLET | ORAL | Status: DC | PRN

## 2017-12-01 SURGICAL SUPPLY — 10 items
CANISTER PENUMBRA MAX (MISCELLANEOUS) IMPLANT
CATH BEACON 5 .035 65 KMP TIP (CATHETERS) ×2 IMPLANT
CATH BEACON 5 .038 100 VERT TP (CATHETERS) ×2 IMPLANT
DEVICE PRESTO INFLATION (MISCELLANEOUS) IMPLANT
DEVICE TORQUE (MISCELLANEOUS) ×2 IMPLANT
GLIDEWIRE STIFF .35X180X3 HYDR (WIRE) ×2 IMPLANT
NEEDLE ENTRY 21GA 7CM ECHOTIP (NEEDLE) ×2 IMPLANT
PACK ANGIOGRAPHY (CUSTOM PROCEDURE TRAY) ×2 IMPLANT
SET INTRO CAPELLA COAXIAL (SET/KITS/TRAYS/PACK) ×2 IMPLANT
SHEATH BRITE TIP 8FRX11 (SHEATH) ×2 IMPLANT

## 2017-12-01 NOTE — Op Note (Signed)
[facility]                                                                               OPERATIVE NOTE    PRE-OPERATIVE DIAGNOSIS: Symptomatic left leg DVT;   POST-OPERATIVE DIAGNOSIS: Same  PROCEDURE: 1. Ultrasound guidance for vascular access to the left popliteal vein 2. Catheter placement into the inferior vena cava  3. Inferior venacavogram with left lower extremity venogram  SURGEON: Levora DredgeGregory Schnier  ASSISTANT(S): None  ANESTHESIA: Conscious sedation was administered by the interventional radiology RN under my direct supervision. IV Versed plus fentanyl were utilized. Continuous ECG, pulse oximetry and blood pressure was monitored throughout the entire procedure.  Conscious sedation was administered for a total of 38 minutes.  ESTIMATED BLOOD LOSS: minimal  Contrast:25 cc  Fluoroscopy time:2.8 minutes  FINDING(S): 1. Patent IVC; thrombus within the left popliteal, superficial femoral vein and common femoral vein  SPECIMEN(S): none  INDICATIONS:  Jeff [name] is a 67 y.o. year old male who presents with massive swelling of the left leg quite painful in association with pleuritic chest pains and hypoxia as well as shortness of breath. Inferior vena cava filter is indicated for this reason. Risks and benefits including filter thrombosis, migration, fracture, bleeding, and infection were all discussed. We discussed that all IVC filters that we place can be removed if desired from the patient once the need for the filter has passed.   DESCRIPTION: After obtaining full informed written consent, the patient was brought back to the vascular suite.     The patient was positioned to the prone and the popliteal fossa of the left leg was prepped and draped in a sterile fashion. Ultrasound was placed in a sterile sleeve. Ultrasound is utilized to gain access to the popliteal  vein. Vein is known to have thrombus within it by previous ultrasound. It is therefore identified by its enlarged size with heterogenous thrombus and non-compressibility. 1% lidocaine is infiltrated in soft tissues and subsequent microneedle is inserted into the popliteal vein without difficulty. Images recorded for the permanent record and the puncture is made under real-time visualization. Microwire is then advanced under fluoroscopic guidance and noted to follow the path of the superficial femoral vein. Therefore the micro-sheath was placed followed by a glide wire and subsequently an 8 [place] sheath.  KMP catheter together with the glide wire then advanced up to the level of the common iliac and hand injection contrast is utilized to demonstrate that the common iliac vein on the left is patent the inferior vena cava is known to be patent from the above filter placement. Catheter is then repositioned to the common femoral level and hand injection contrast demonstrates that there is a and occlusive thrombus within the common femoral and iliac veins.  The wire was then reintroduced and  the catheter was advanced into the inferior vena cava.  Hand-injection of contrast demonstrates thrombus within the inferior vena cava as well.  The IVC filter is in good position.  The catheter was then advanced up to the IVC filter and then above the IVC filter.  The hand-injection contrast demonstrates the filter itself is full of thrombus and there is thrombus above the filter as well.   Interpretation: The venography demonstrates thrombus throughout the popliteal femoral iliac and caval systems.  There is thrombus both within the filter as well as above the filter.  Given these findings thrombolysis is not undertaken.  The patient is tolerating his Xarelto and therefore suprarenal filter is not indicated.  COMPLICATIONS: None  CONDITION: Stable  Levora Dredge  12/01/2017,9:23 PM

## 2017-12-01 NOTE — H&P (Signed)
[facility] History & Physical Update  The patient was interviewed and re-examined.  The patient's previous History and Physical has been reviewed and is unchanged.  There is no change in the plan of care. We plan to proceed with the scheduled procedure.  Levora DredgeGregory Glendine Swetz, MD  12/01/2017, 12:51 PM

## 2017-12-02 ENCOUNTER — Encounter: Payer: Self-pay | Admitting: Vascular Surgery

## 2017-12-06 ENCOUNTER — Telehealth (INDEPENDENT_AMBULATORY_CARE_PROVIDER_SITE_OTHER): Payer: Self-pay

## 2017-12-06 NOTE — Telephone Encounter (Signed)
Patient called stating that his right leg is swelling. He had a procedure [facility] where an IVC filter was placed by Dr. Gilda CreaseSchnier due to a left leg DVT. The patient is on Xarelto and was told by Schnier to wear compression hose and elevate his leg. The patient stated that the last couple of days his right leg has been swelling and he wanted to let Dr. Gilda CreaseSchnier know about it. The patient does have an appt in our office on 12/15/17 @ 9:45 with Dr. Gilda CreaseSchnier. Per Raul DelKim Stegmayer PA the patient is covered for DVT as he has a IVC filter and is on Xarelto.

## 2017-12-08 ENCOUNTER — Encounter: Payer: Self-pay | Admitting: Family Medicine

## 2017-12-08 ENCOUNTER — Ambulatory Visit (INDEPENDENT_AMBULATORY_CARE_PROVIDER_SITE_OTHER): Payer: Medicare HMO | Admitting: Family Medicine

## 2017-12-08 DIAGNOSIS — R040 Epistaxis: Secondary | ICD-10-CM | POA: Diagnosis not present

## 2017-12-08 DIAGNOSIS — I82403 Acute embolism and thrombosis of unspecified deep veins of lower extremity, bilateral: Secondary | ICD-10-CM | POA: Diagnosis not present

## 2017-12-08 DIAGNOSIS — E1159 Type 2 diabetes mellitus with other circulatory complications: Secondary | ICD-10-CM | POA: Diagnosis not present

## 2017-12-08 DIAGNOSIS — N179 Acute kidney failure, unspecified: Secondary | ICD-10-CM | POA: Diagnosis not present

## 2017-12-08 DIAGNOSIS — I10 Essential (primary) hypertension: Secondary | ICD-10-CM | POA: Diagnosis not present

## 2017-12-08 DIAGNOSIS — I480 Paroxysmal atrial fibrillation: Secondary | ICD-10-CM | POA: Diagnosis not present

## 2017-12-08 NOTE — Assessment & Plan Note (Signed)
Discussed hypertension patient transiently elevated here will follow at home and here and if stays elevated we will need to reassess blood pressure.

## 2017-12-08 NOTE — Progress Notes (Signed)
BP (!) 150/80 (BP Location: Left Arm)   Pulse (!) 59   Wt 284 lb (128.8 kg)   SpO2 95%   BMI 39.61 kg/m    Subjective:    Patient ID: Jeff [name], male    DOB: 1951/07/05, 67 y.o.   MRN: [id number]  HPI: Jeff [name] is a 67 y.o. male  Chief Complaint  Patient presents with  . Hospitalization Follow-up  Patient just in the hospital for dehydration.  This is secondary to bad sinus infection was on 2 rounds of antibiotics when developed dehydration and was admitted to the hospital.  During hospitalization developed deep venous thrombosis of both legs.  Patient with known atrial fibrillation. Had been only taking aspirin.  Did have a filter in place in his right femoral vein. Patient now taking Xarelto 20 mg once a day without problems. This is his second bout of deep venous thrombosis.  Patient also wearing compression on lower legs for edema. Also developed acute renal failure while hospitalized which is resolving. On review patient's blood pressures been consistently good.  Relevant past medical, surgical, family and social history reviewed and updated as indicated. Interim medical history since our last visit reviewed. Allergies and medications reviewed and updated.  Review of Systems  Constitutional: Negative.   Respiratory: Negative.   Cardiovascular: Negative.     Per HPI unless specifically indicated above     Objective:    BP (!) 150/80 (BP Location: Left Arm)   Pulse (!) 59   Wt 284 lb (128.8 kg)   SpO2 95%   BMI 39.61 kg/m   Wt Readings from Last 3 Encounters:  12/08/17 284 lb (128.8 kg)  12/01/17 276 lb (125.2 kg)  11/28/17 276 lb (125.2 kg)    Physical Exam  Constitutional: He is oriented to person, place, and time. He appears well-developed and well-nourished.  HENT:  Head: Normocephalic and atraumatic.  Eyes: Conjunctivae and EOM are normal.  Neck: Normal range of motion.  Cardiovascular: Normal rate, regular rhythm and normal heart sounds.    Pulmonary/Chest: Effort normal and breath sounds normal.  Musculoskeletal: Normal range of motion.  Neurological: He is alert and oriented to person, place, and time.  Skin: No erythema.  Psychiatric: He has a normal mood and affect. His behavior is normal. Judgment and thought content normal.    Results for orders placed or performed during the hospital encounter of 12/01/17  BUN  Result Value Ref Range   BUN 19 6 - 20 mg/dL  Creatinine, serum  Result Value Ref Range   Creatinine, Ser 1.44 (H) 0.61 - 1.24 mg/dL   GFR calc non Af Amer 49 (L) >60 mL/min   GFR calc Af Amer 57 (L) >60 mL/min  Glucose, capillary  Result Value Ref Range   Glucose-Capillary 186 (H) 65 - 99 mg/dL      Assessment & Plan:   Problem List Items Addressed This Visit      Cardiovascular and Mediastinum   HYPERTENSION, BENIGN    Discussed hypertension patient transiently elevated here will follow at home and here and if stays elevated we will need to reassess blood pressure.      ATRIAL FIBRILLATION    Followed by cardiology on blood thinners now      DVT of lower limb, acute (HCC)    2nd episode of DVT was off anti-coagulation on anti-coagulation now      Relevant Orders   Ambulatory referral to Vascular Surgery  Genitourinary   Acute renal failure (ARF) (HCC)    Reviewed labs and resolved        Other   Bleeding nose    Patient on Xarelto with first time ever nosebleeds. Does have chronic nasal congestion and sinus drainage      Relevant Orders   Ambulatory referral to ENT      Patient will reschedule his physical for later next week for 3 months. Follow up plan: Return in about 3 months (around 03/07/2018), or if symptoms worsen or fail to improve, for Physical Exam, Hemoglobin A1c.

## 2017-12-08 NOTE — Assessment & Plan Note (Signed)
Patient on Xarelto with first time ever nosebleeds. Does have chronic nasal congestion and sinus drainage

## 2017-12-08 NOTE — Assessment & Plan Note (Signed)
Reviewed labs and resolved

## 2017-12-08 NOTE — Assessment & Plan Note (Signed)
2nd episode of DVT was off anti-coagulation on anti-coagulation now

## 2017-12-08 NOTE — Assessment & Plan Note (Signed)
Discussed diabetes poor control will increase metformin to 1000 twice daily weight loss and will be rechecking in 3 months at physical.

## 2017-12-08 NOTE — Assessment & Plan Note (Signed)
Followed by cardiology on blood thinners now

## 2017-12-09 ENCOUNTER — Ambulatory Visit: Payer: Medicare HMO | Admitting: Physician Assistant

## 2017-12-12 ENCOUNTER — Telehealth: Payer: Self-pay | Admitting: Family Medicine

## 2017-12-12 DIAGNOSIS — E1159 Type 2 diabetes mellitus with other circulatory complications: Secondary | ICD-10-CM

## 2017-12-12 MED ORDER — GLUCOSE BLOOD VI STRP: ORAL_STRIP | 12 refills | Status: DC

## 2017-12-12 NOTE — Telephone Encounter (Signed)
Contour Next Blood Glucose Test Strips refill.  Last OV: 12/08/17 with Dr. Dossie Arbourrissman Next OV 03/02/18 Last Refill:Not on current med list [facility] [place] [address]

## 2017-12-12 NOTE — Telephone Encounter (Signed)
ok 

## 2017-12-12 NOTE — Telephone Encounter (Signed)
Copied from CRM [id number]. Topic: Quick Communication - Rx Refill/Question >> Dec 12, 2017  1:22 PM Guinevere FerrariMorris, Sebastien Jackson E, NT wrote: Medication: Contour Next Blood Glucose  Test Strips   Has the patient contacted their pharmacy? Yes   (Agent: If no, request that the patient contact the pharmacy for the refill.)   Preferred Pharmacy (with phone number or street name): [id number] [place], [place] - [address] [phone] (Phone) [phone] (Fax)     Agent: Please be advised that RX refills may take up to 3 business days. We ask that you follow-up with your pharmacy.

## 2017-12-15 ENCOUNTER — Ambulatory Visit (INDEPENDENT_AMBULATORY_CARE_PROVIDER_SITE_OTHER): Payer: Medicare HMO | Admitting: Vascular Surgery

## 2017-12-16 DIAGNOSIS — I82220 Acute embolism and thrombosis of inferior vena cava: Secondary | ICD-10-CM | POA: Diagnosis not present

## 2017-12-16 DIAGNOSIS — M79605 Pain in left leg: Secondary | ICD-10-CM | POA: Diagnosis not present

## 2017-12-16 DIAGNOSIS — Z86718 Personal history of other venous thrombosis and embolism: Secondary | ICD-10-CM | POA: Diagnosis not present

## 2017-12-17 DIAGNOSIS — Z86718 Personal history of other venous thrombosis and embolism: Secondary | ICD-10-CM | POA: Insufficient documentation

## 2017-12-19 DIAGNOSIS — R040 Epistaxis: Secondary | ICD-10-CM | POA: Diagnosis not present

## 2017-12-19 DIAGNOSIS — J320 Chronic maxillary sinusitis: Secondary | ICD-10-CM | POA: Diagnosis not present

## 2017-12-20 ENCOUNTER — Other Ambulatory Visit: Payer: Self-pay | Admitting: *Deleted

## 2017-12-20 MED ORDER — DIGOXIN 250 MCG PO TABS: 0.2500 mg | ORAL_TABLET | Freq: Every day | ORAL | 3 refills | Status: DC

## 2017-12-22 ENCOUNTER — Encounter: Payer: Self-pay | Admitting: Family Medicine

## 2017-12-23 DIAGNOSIS — I82220 Acute embolism and thrombosis of inferior vena cava: Secondary | ICD-10-CM | POA: Diagnosis not present

## 2017-12-23 DIAGNOSIS — R918 Other nonspecific abnormal finding of lung field: Secondary | ICD-10-CM | POA: Diagnosis not present

## 2017-12-28 ENCOUNTER — Ambulatory Visit: Payer: Medicare HMO | Admitting: Urology

## 2017-12-28 DIAGNOSIS — Z86718 Personal history of other venous thrombosis and embolism: Secondary | ICD-10-CM | POA: Diagnosis not present

## 2017-12-28 DIAGNOSIS — I82220 Acute embolism and thrombosis of inferior vena cava: Secondary | ICD-10-CM | POA: Diagnosis not present

## 2017-12-29 DIAGNOSIS — Z95828 Presence of other vascular implants and grafts: Secondary | ICD-10-CM | POA: Insufficient documentation

## 2017-12-29 DIAGNOSIS — J329 Chronic sinusitis, unspecified: Secondary | ICD-10-CM | POA: Insufficient documentation

## 2018-01-02 DIAGNOSIS — I82220 Acute embolism and thrombosis of inferior vena cava: Secondary | ICD-10-CM | POA: Diagnosis not present

## 2018-01-02 DIAGNOSIS — Z95828 Presence of other vascular implants and grafts: Secondary | ICD-10-CM | POA: Diagnosis not present

## 2018-01-02 DIAGNOSIS — I480 Paroxysmal atrial fibrillation: Secondary | ICD-10-CM | POA: Diagnosis not present

## 2018-01-03 DIAGNOSIS — I482 Chronic atrial fibrillation: Secondary | ICD-10-CM | POA: Diagnosis not present

## 2018-01-03 DIAGNOSIS — I82423 Acute embolism and thrombosis of iliac vein, bilateral: Secondary | ICD-10-CM | POA: Diagnosis not present

## 2018-01-03 DIAGNOSIS — I82220 Acute embolism and thrombosis of inferior vena cava: Secondary | ICD-10-CM | POA: Diagnosis not present

## 2018-01-03 DIAGNOSIS — D62 Acute posthemorrhagic anemia: Secondary | ICD-10-CM | POA: Diagnosis not present

## 2018-01-03 DIAGNOSIS — Z79899 Other long term (current) drug therapy: Secondary | ICD-10-CM | POA: Diagnosis not present

## 2018-01-03 DIAGNOSIS — I82413 Acute embolism and thrombosis of femoral vein, bilateral: Secondary | ICD-10-CM | POA: Diagnosis not present

## 2018-01-03 DIAGNOSIS — E119 Type 2 diabetes mellitus without complications: Secondary | ICD-10-CM | POA: Diagnosis not present

## 2018-01-03 DIAGNOSIS — Z86718 Personal history of other venous thrombosis and embolism: Secondary | ICD-10-CM | POA: Diagnosis not present

## 2018-01-03 DIAGNOSIS — Z7901 Long term (current) use of anticoagulants: Secondary | ICD-10-CM | POA: Diagnosis not present

## 2018-01-03 DIAGNOSIS — Z7984 Long term (current) use of oral hypoglycemic drugs: Secondary | ICD-10-CM | POA: Diagnosis not present

## 2018-01-04 DIAGNOSIS — Z86718 Personal history of other venous thrombosis and embolism: Secondary | ICD-10-CM | POA: Diagnosis not present

## 2018-01-04 DIAGNOSIS — D62 Acute posthemorrhagic anemia: Secondary | ICD-10-CM | POA: Diagnosis not present

## 2018-01-04 DIAGNOSIS — Z6841 Body Mass Index (BMI) 40.0 and over, adult: Secondary | ICD-10-CM | POA: Diagnosis not present

## 2018-01-04 DIAGNOSIS — I480 Paroxysmal atrial fibrillation: Secondary | ICD-10-CM | POA: Diagnosis not present

## 2018-01-04 DIAGNOSIS — E669 Obesity, unspecified: Secondary | ICD-10-CM | POA: Diagnosis not present

## 2018-01-04 DIAGNOSIS — I82220 Acute embolism and thrombosis of inferior vena cava: Secondary | ICD-10-CM | POA: Diagnosis not present

## 2018-01-04 DIAGNOSIS — E119 Type 2 diabetes mellitus without complications: Secondary | ICD-10-CM | POA: Diagnosis not present

## 2018-01-04 DIAGNOSIS — I82413 Acute embolism and thrombosis of femoral vein, bilateral: Secondary | ICD-10-CM | POA: Diagnosis not present

## 2018-01-04 DIAGNOSIS — Z95828 Presence of other vascular implants and grafts: Secondary | ICD-10-CM | POA: Diagnosis not present

## 2018-01-05 DIAGNOSIS — J320 Chronic maxillary sinusitis: Secondary | ICD-10-CM | POA: Diagnosis not present

## 2018-01-10 ENCOUNTER — Other Ambulatory Visit: Payer: Self-pay | Admitting: Family Medicine

## 2018-02-03 ENCOUNTER — Other Ambulatory Visit: Payer: Self-pay | Admitting: Family Medicine

## 2018-02-03 NOTE — Telephone Encounter (Signed)
Metformin refill Last OV: 09/07/17 Last Refill:01/11/18 #180 tabs 4 RF [facility] [address] PCP: Dr Dossie Arbourrissman Last Hgb A1C 11/20/17  : 8.0  An electronic refill was sent 01/11/18. Called pharmacy and confirmed that refill was not received. Not sure if Metformin frequency has changed. There is a note in med list that patient needs a new RX. It states that the pt is taking 2 tabs in the morning and 2 tabs in the evening. Did not see an OV that confirmed this. Sending back to provider.

## 2018-02-15 ENCOUNTER — Ambulatory Visit: Payer: Self-pay | Admitting: *Deleted

## 2018-02-15 ENCOUNTER — Telehealth: Payer: Self-pay | Admitting: Family Medicine

## 2018-02-15 MED ORDER — FLUTICASONE PROPIONATE 50 MCG/ACT NA SUSP: 2.0000 | Freq: Every day | NASAL | 11 refills | Status: DC

## 2018-02-15 MED ORDER — BENZONATATE 100 MG PO CAPS: 100.0000 mg | ORAL_CAPSULE | Freq: Two times a day (BID) | ORAL | 1 refills | Status: DC | PRN

## 2018-02-15 NOTE — Telephone Encounter (Signed)
Request for Flonase nasal spray. This prescription expired 10/04/17. Also he is requesting tussionex suspension.  LOV  12/08/17  Provider  Dr. Hadley PenM. Crissman  He is requesting these meds to be called in.   [phone]  [address]  [place]

## 2018-02-15 NOTE — Telephone Encounter (Signed)
Copied from CRM [phone]. Topic: Quick Communication - Rx Refill/Question >> Feb 15, 2018  8:20 AM Maia Pettiesrtiz, Kristie S wrote: Medication: fluticasone (FLONASE) 50 MCG/ACT nasal spray  & chlorpheniramine-HYDROcodone (TUSSIONEX) 10-8 MG/5ML suspension 5 mL - pt requesting both for sinus/allergies/cough Has the patient contacted their pharmacy? No - no refills/expired RX Preferred Pharmacy (with phone number or street name): [id number] [place], [place] - [address] [phone] (Phone) [phone] (Fax)

## 2018-02-15 NOTE — Telephone Encounter (Signed)
Pt called because he is wanting Flonase and Tussionex for his seasonal allergies. He states that he is coughing from the sinus drainage that he is having. He has a nonproductive cough. He does not want to make an appointment to be seen.  He is asking for a call back regarding these meds.   Reason for Disposition . [1] Nasal allergies AND [2] only certain times of year AND [3] hay fever diagnosis has never been confirmed by a HCP  Answer Assessment - Initial Assessment Questions 1. SYMPTOM: "What's the main symptom you're concerned about?" (e.g., runny nose, stuffiness, sneezing, itching)     Runny nose, coughing 2. SEVERITY: "How bad is it?" "What does it keep you from doing?" (e.g., sleeping, working)      Getting bad 3. EYES: "Are the eyes also red, watery, and itchy?"      n 4. TRIGGER: "What pollen or other allergic substance do you think is causing the symptoms?"      pollen 5. TREATMENT: "What medicine are you using?" "What medicine worked best in the past?"     Finishing up Flonase and some cough med 6. OTHER SYMPTOMS: "Do you have any other symptoms?" (e.g., coughing, difficulty breathing, wheezing)     coughing 7. PREGNANCY: "Is there any chance you are pregnant?" "When was your last menstrual period?"     n/a  Protocols used: NASAL ALLERGIES (HAY FEVER)-A-AH

## 2018-02-17 DIAGNOSIS — J301 Allergic rhinitis due to pollen: Secondary | ICD-10-CM | POA: Diagnosis not present

## 2018-02-17 DIAGNOSIS — J340 Abscess, furuncle and carbuncle of nose: Secondary | ICD-10-CM | POA: Diagnosis not present

## 2018-02-17 DIAGNOSIS — J320 Chronic maxillary sinusitis: Secondary | ICD-10-CM | POA: Diagnosis not present

## 2018-02-18 ENCOUNTER — Other Ambulatory Visit: Payer: Self-pay | Admitting: Family Medicine

## 2018-02-18 DIAGNOSIS — I10 Essential (primary) hypertension: Secondary | ICD-10-CM

## 2018-02-18 DIAGNOSIS — E782 Mixed hyperlipidemia: Secondary | ICD-10-CM

## 2018-02-20 NOTE — Telephone Encounter (Signed)
Lipitor refill request - looks like this was discontinued on 11/25/17.  Metoprolol refill request  Pt of Dr. Dossie Arbourrissman  [facility] [address] [place] - [phone]

## 2018-02-22 DIAGNOSIS — I82423 Acute embolism and thrombosis of iliac vein, bilateral: Secondary | ICD-10-CM | POA: Diagnosis not present

## 2018-02-22 DIAGNOSIS — Z48812 Encounter for surgical aftercare following surgery on the circulatory system: Secondary | ICD-10-CM | POA: Diagnosis not present

## 2018-02-22 DIAGNOSIS — Z9889 Other specified postprocedural states: Secondary | ICD-10-CM | POA: Diagnosis not present

## 2018-02-22 DIAGNOSIS — I82220 Acute embolism and thrombosis of inferior vena cava: Secondary | ICD-10-CM | POA: Diagnosis not present

## 2018-02-22 DIAGNOSIS — M7989 Other specified soft tissue disorders: Secondary | ICD-10-CM | POA: Diagnosis not present

## 2018-02-22 DIAGNOSIS — I82413 Acute embolism and thrombosis of femoral vein, bilateral: Secondary | ICD-10-CM | POA: Diagnosis not present

## 2018-02-22 NOTE — Progress Notes (Signed)
Cardiology Office Note  Date:  02/23/2018   ID:  Ricardo Jerichoed Allen Kinser, DOB [birth year], MRN [id number]  PCP:  Steele Sizerrissman, Mark A, MD   Chief Complaint  Patient presents with  . Other    3 month follow up. Meds reviewed by the pt. verbally. Patient had [facility] "doing well."     HPI:  Abigail Buttsed Hardigree is a 67 y/o male with h/o  HTN, and extensive DM2  hemoglobin A1c 7.7 DVT LLE 2010, with filter hyperlipidemia   April 2011  atrial fibrillation with RVR,  emergency room on April 15 2010 for rapid atrial fibrillation,  s/p cardioversion   lower extremity edema On calcium channel blockers,    Echo in 02/2010 shows EF 40-45%,  possibly tachycardia induced  Converted back to atrial fibrillation in 2016, at that time did not want cardioversion  he presents for routine followup for chronic atrial fibrillation, and extensive DVT  On prior clinic visit was not taking xarelto for his atrial fibrillation developed severe sinusitis/URI , weight loss over 2 weeks, bedrest  Became tremendously weak requiring hospitalization  early January 2019 Diagnosed with dehydration, renal failure, syncope, left lower extremity DVT Syncope in the setting of not eating well, weakness  Started on heparin infusion for DVT left lower extremity  thrombus was occlusive left SFA down to popliteal   IVC filter in place from 2010  As outpatient seen by Dr. Lorretta HarpSchneir, "too much clot" Recommended lymphedema compression pumps  Referred to [facility] for thrombectomy Reports that he had thrombectomy procedure , took hours 5 pm to 10 pm No anesthesia, very painful  He has [facility] Confirming residual B/l extensive clot, iliacs extending downward  Reports the lower extremity swelling has dramatically improved, not quite normal but close to it  Blood pressure elevated on today's visit, better at home in the 130s systolic Denies any significant shortness of breath  or chest pain on exertion  EKG personally reviewed by myself on todays visit Shows atrial fibrillation with ventricular rate 82 bpm no significant ST or T-wave changes  Other past medical history reviewed  Echo in 02/2010 shows EF 40-45% which was felt to be tachycardia induced.  Prior lab work LDL 110, hemoglobin A1c 9.1, HDL 36, total cholesterol 295169    PMH:   has a past medical history of Atrial fibrillation (HCC), Chronic low back pain, Chronic systolic heart failure (HCC), Diabetes mellitus without complication (HCC), DVT of lower limb, acute (HCC) (2014), Erectile dysfunction, HLD (hyperlipidemia), Hypertension, and Hypogonadism in male.  PSH:    Past Surgical History:  Procedure Laterality Date  . COLONOSCOPY WITH PROPOFOL N/A 05/28/2016   Procedure: COLONOSCOPY WITH PROPOFOL;  Surgeon: Midge Miniumarren Wohl, MD;  [facility];  Service: Endoscopy;  Laterality: N/A;  diabetic - oral meds  . DVT, leg Left   . INSERTION OF VENA CAVA FILTER  2014  . PERIPHERAL VASCULAR THROMBECTOMY Left 12/01/2017   Procedure: PERIPHERAL VASCULAR THROMBECTOMY;  Surgeon: Renford DillsSchnier, Gregory G, MD;  Location: [facility] INVASIVE CV LAB;  Service: Cardiovascular;  Laterality: Left;    Current Outpatient Medications  Medication Sig Dispense Refill  . amLODipine (NORVASC) 10 MG tablet TAKE 1 TABLET (10 MG TOTAL) BY MOUTH DAILY.  0  . aspirin EC 325 MG tablet Take by mouth.    Marland Kitchen. atorvastatin (LIPITOR) 10 MG tablet TAKE 1 TABLET (10 MG TOTAL) BY MOUTH DAILY. 90 tablet 0  . benzonatate (TESSALON)  100 MG capsule Take 1 capsule (100 mg total) by mouth 2 (two) times daily as needed for cough. Must have office visit to get Tussionex 20 capsule 1  . digoxin (LANOXIN) 0.25 MG tablet Take 1 tablet (0.25 mg total) by mouth daily. 90 tablet 3  . fluticasone (FLONASE) 50 MCG/ACT nasal spray Place 2 sprays into both nostrils daily. 16 g 11  . glucose blood (CONTOUR NEXT TEST) test strip Use as instructed 100 each 12   . metFORMIN (GLUCOPHAGE) 1000 MG tablet Take 1 tablet (1,000 mg total) by mouth 2 (two) times daily with a meal. 180 tablet 4  . metoprolol tartrate (LOPRESSOR) 50 MG tablet TAKE 1 TABLET (50 MG TOTAL) BY MOUTH 2 (TWO) TIMES DAILY. 180 tablet 0  . predniSONE (DELTASONE) 50 MG tablet TAKE 1 TABLET (50 MG TOTAL) BY MOUTH DAILY WITH BREAKFAST.    . rivaroxaban (XARELTO) 20 MG TABS tablet Take 1 tablet (20 mg total) by mouth daily with supper. 30 tablet 11  . traMADol (ULTRAM) 50 MG tablet One to Two Tabs Every Six Hours As Needed For Pain 60 tablet 0   No current facility-administered medications for this visit.      Allergies:   Patient has no known allergies.   Social History:  The patient  reports that he has never smoked. He has never used smokeless tobacco. He reports that he does not drink alcohol or use drugs.   Family History:   family history includes Diabetes in his father; Diabetes Mellitus II in his unknown relative; Heart failure in his father; Lymphoma in his mother.    Review of Systems: Review of Systems  Constitutional: Negative.   Respiratory: Negative.   Cardiovascular: Positive for leg swelling.  Gastrointestinal: Negative.   Musculoskeletal: Negative.   Neurological: Negative.   Psychiatric/Behavioral: Negative.   All other systems reviewed and are negative.    PHYSICAL EXAM: VS:  BP (!) 158/88 (BP Location: Left Arm, Patient Position: Sitting, Cuff Size: Large)   Pulse 82   Ht 5\' 11"  (1.803 m)   Wt 279 lb 12 oz (126.9 kg)   BMI 39.02 kg/m  , BMI Body mass index is 39.02 kg/m. GEN: Well nourished, well developed, in no acute distress , obese HEENT: normal  Neck: no JVD, carotid bruits, or masses Cardiac: Irregularly irregular, tachycardic no murmurs, rubs, or gallops,no edema  Respiratory:  clear to auscultation bilaterally, normal work of breathing GI: soft, nontender, nondistended, + BS MS: no deformity or atrophy  Skin: warm and dry, no rash Neuro:   Strength and sensation are intact Psych: euthymic mood, full affect   Recent Labs: 11/20/2017: ALT 12; TSH 2.638 11/23/2017: Hemoglobin 14.1; Platelets 111; Potassium 4.2; Sodium 133 12/01/2017: BUN 19; Creatinine, Ser 1.44    Lipid Panel Lab Results  Component Value Date   CHOL 115 12/20/2016   HDL 32 (L) 12/20/2016   LDLCALC 37 12/20/2016   TRIG 229 (H) 12/20/2016      Wt Readings from Last 3 Encounters:  02/23/18 279 lb 12 oz (126.9 kg)  02/23/18 281 lb 12.8 oz (127.8 kg)  12/08/17 284 lb (128.8 kg)      ASSESSMENT AND PLAN:  Congestive dilated cardiomyopathy (HCC) - Plan: EKG 12-Lead 40-45% ejection fraction in 2011 , no recent echocardiogram available.  Likely tachycardia mediated cardiomyopathy  Rate well-controlled on current medications  Permanent atrial fibrillation (HCC) - Plan: EKG 12-Lead Stay on xarelto , indefinitely in the setting of DVT Rate improved on current  regiment digoxin, metoprolol  HYPERTENSION, BENIGN - Plan: EKG 12-Lead Initially elevated, reports it is well controlled at home No medication changes made  Chronic DVT history of DVT 2010 with filter placed at that time Recurrent DVT in the setting of being bedbound with upper respiratory tract infection,  Currently without a filter,  still with extensive thrombus, managed by [facility]  Limitations discussed with him He has indicated he has follow-up with [facility] for possible repeat procedure in 2 months  Mixed hyperlipidemia - Plan: EKG 12-Lead Cholesterol is at goal on the current lipid regimen. No changes to the medications were made. Stable.  Weight is down 20 pounds   Type 2 diabetes mellitus with other circulatory complication, without long-term current use of insulin (HCC) -  Hemoglobin A1c 8.0  He is down 20 pounds  Encounter for anticoagulation discussion and counseling stressed importance of staying on anticoagulation given DVT and atrial fibrillation Tolerating Xarelto    Total encounter time more than 25 minutes  Greater than 50% was spent in counseling and coordination of care with the patient   Disposition:   F/U  6 months   Orders Placed This Encounter  Procedures  . EKG 12-Lead     Signed, Dossie Arbour, M.D., Ph.D. 02/23/2018  [facility] [place], [place] [phone]

## 2018-02-23 ENCOUNTER — Encounter: Payer: Self-pay | Admitting: Cardiovascular Disease

## 2018-02-23 ENCOUNTER — Ambulatory Visit: Payer: Medicare HMO | Admitting: Cardiovascular Disease

## 2018-02-23 ENCOUNTER — Ambulatory Visit (INDEPENDENT_AMBULATORY_CARE_PROVIDER_SITE_OTHER): Payer: Medicare HMO

## 2018-02-23 VITALS — BP 168/98 | HR 79 | Temp 98.2°F | Resp 16 | Ht 69.0 in | Wt 281.8 lb

## 2018-02-23 VITALS — BP 158/88 | HR 82 | Ht 71.0 in | Wt 279.8 lb

## 2018-02-23 DIAGNOSIS — E782 Mixed hyperlipidemia: Secondary | ICD-10-CM | POA: Diagnosis not present

## 2018-02-23 DIAGNOSIS — I420 Dilated cardiomyopathy: Secondary | ICD-10-CM

## 2018-02-23 DIAGNOSIS — I480 Paroxysmal atrial fibrillation: Secondary | ICD-10-CM

## 2018-02-23 DIAGNOSIS — E1159 Type 2 diabetes mellitus with other circulatory complications: Secondary | ICD-10-CM

## 2018-02-23 DIAGNOSIS — N401 Enlarged prostate with lower urinary tract symptoms: Secondary | ICD-10-CM | POA: Diagnosis not present

## 2018-02-23 DIAGNOSIS — I10 Essential (primary) hypertension: Secondary | ICD-10-CM | POA: Diagnosis not present

## 2018-02-23 DIAGNOSIS — Z23 Encounter for immunization: Secondary | ICD-10-CM

## 2018-02-23 DIAGNOSIS — Z0000 Encounter for general adult medical examination without abnormal findings: Secondary | ICD-10-CM

## 2018-02-23 DIAGNOSIS — Z1159 Encounter for screening for other viral diseases: Secondary | ICD-10-CM | POA: Diagnosis not present

## 2018-02-23 DIAGNOSIS — R5383 Other fatigue: Secondary | ICD-10-CM

## 2018-02-23 LAB — URINALYSIS, ROUTINE W REFLEX MICROSCOPIC
BILIRUBIN UA: NEGATIVE
Ketones, UA: NEGATIVE
LEUKOCYTES UA: NEGATIVE
Nitrite, UA: NEGATIVE
PROTEIN UA: NEGATIVE
SPEC GRAV UA: 1.01 (ref 1.005–1.030)
UUROB: 0.2 mg/dL (ref 0.2–1.0)
pH, UA: 6 (ref 5.0–7.5)

## 2018-02-23 LAB — MICROSCOPIC EXAMINATION: BACTERIA UA: NONE SEEN

## 2018-02-23 LAB — BAYER DCA HB A1C WAIVED: HB A1C (BAYER DCA - WAIVED): 7.2 % — ABNORMAL HIGH (ref <7.0)

## 2018-02-23 NOTE — Patient Instructions (Signed)
[name] , Thank you for taking time to come for your Medicare Wellness Visit. I appreciate your ongoing commitment to your health goals. Please review the following plan we discussed and let me know if I can assist you in the future.   Screening recommendations/referrals: Colonoscopy: completed  Recommended yearly ophthalmology/optometry visit for glaucoma screening and checkup Recommended yearly dental visit for hygiene and checkup  Vaccinations: Influenza vaccine: up to date Pneumococcal vaccine: completed series Tdap vaccine: up to date Shingles vaccine: eligible for shingrix, check with your insurance company for coverage     Advanced directives: Advance directive discussed with you today. Even though you declined this today please call our office should you change your mind and we can give you the proper paperwork for you to fill out.  Conditions/risks identified: recommend drinking at least 6-8 glasses of water a day   Next appointment: Follow up on 03/02/2018 at 10:00am with Jeff [name]. Follow up in one year for your annual wellness exam.   Preventive Care 65 Years and Older, Male Preventive care refers to lifestyle choices and visits with your health care provider that can promote health and wellness. What does preventive care include?  A yearly physical exam. This is also called an annual well check.  Dental exams once or twice a year.  Routine eye exams. Ask your health care provider how often you should have your eyes checked.  Personal lifestyle choices, including:  Daily care of your teeth and gums.  Regular physical activity.  Eating a healthy diet.  Avoiding tobacco and drug use.  Limiting alcohol use.  Practicing safe sex.  Taking low doses of aspirin every day.  Taking vitamin and mineral supplements as recommended by your health care provider. What happens during an annual well check? The services and screenings done by your health care provider  during your annual well check will depend on your age, overall health, lifestyle risk factors, and family history of disease. Counseling  Your health care provider may ask you questions about your:  Alcohol use.  Tobacco use.  Drug use.  Emotional well-being.  Home and relationship well-being.  Sexual activity.  Eating habits.  History of falls.  Memory and ability to understand (cognition).  Work and work Astronomerenvironment. Screening  You may have the following tests or measurements:  Height, weight, and BMI.  Blood pressure.  Lipid and cholesterol levels. These may be checked every 5 years, or more frequently if you are over 67 years old.  Skin check.  Lung cancer screening. You may have this screening every year starting at age 67 if you have a 30-pack-year history of smoking and currently smoke or have quit within the past 15 years.  Fecal occult blood test (FOBT) of the stool. You may have this test every year starting at age 67.  Flexible sigmoidoscopy or colonoscopy. You may have a sigmoidoscopy every 5 years or a colonoscopy every 10 years starting at age 67.  Prostate cancer screening. Recommendations will vary depending on your family history and other risks.  Hepatitis C blood test.  Hepatitis B blood test.  Sexually transmitted disease (STD) testing.  Diabetes screening. This is done by checking your blood sugar (glucose) after you have not eaten for a while (fasting). You may have this done every 1-3 years.  Abdominal aortic aneurysm (AAA) screening. You may need this if you are a current or former smoker.  Osteoporosis. You may be screened starting at age 67 if you are at high  risk. Talk with your health care provider about your test results, treatment options, and if necessary, the need for more tests. Vaccines  Your health care provider may recommend certain vaccines, such as:  Influenza vaccine. This is recommended every year.  Tetanus,  diphtheria, and acellular pertussis (Tdap, Td) vaccine. You may need a Td booster every 10 years.  Zoster vaccine. You may need this after age 75.  Pneumococcal 13-valent conjugate (PCV13) vaccine. One dose is recommended after age 37.  Pneumococcal polysaccharide (PPSV23) vaccine. One dose is recommended after age 6. Talk to your health care provider about which screenings and vaccines you need and how often you need them. This information is not intended to replace advice given to you by your health care provider. Make sure you discuss any questions you have with your health care provider. Document Released: 11/21/2015 Document Revised: 07/14/2016 Document Reviewed: 08/26/2015 Elsevier Interactive Patient Education  2017 [employer] Prevention in the Home Falls can cause injuries. They can happen to people of all ages. There are many things you can do to make your home safe and to help prevent falls. What can I do on the outside of my home?  Regularly fix the edges of walkways and driveways and fix any cracks.  Remove anything that might make you trip as you walk through a door, such as a raised step or threshold.  Trim any bushes or trees on the path to your home.  Use bright outdoor lighting.  Clear any walking paths of anything that might make someone trip, such as rocks or tools.  Regularly check to see if handrails are loose or broken. Make sure that both sides of any steps have handrails.  Any raised decks and porches should have guardrails on the edges.  Have any leaves, snow, or ice cleared regularly.  Use sand or salt on walking paths during winter.  Clean up any spills in your garage right away. This includes oil or grease spills. What can I do in the bathroom?  Use night lights.  Install grab bars by the toilet and in the tub and shower. Do not use towel bars as grab bars.  Use non-skid mats or decals in the tub or shower.  If you need to sit down in  the shower, use a plastic, non-slip stool.  Keep the floor dry. Clean up any water that spills on the floor as soon as it happens.  Remove soap buildup in the tub or shower regularly.  Attach bath mats securely with double-sided non-slip rug tape.  Do not have throw rugs and other things on the floor that can make you trip. What can I do in the bedroom?  Use night lights.  Make sure that you have a light by your bed that is easy to reach.  Do not use any sheets or blankets that are too big for your bed. They should not hang down onto the floor.  Have a firm chair that has side arms. You can use this for support while you get dressed.  Do not have throw rugs and other things on the floor that can make you trip. What can I do in the kitchen?  Clean up any spills right away.  Avoid walking on wet floors.  Keep items that you use a lot in easy-to-reach places.  If you need to reach something above you, use a strong step stool that has a grab bar.  Keep electrical cords out of the way.  Do not use floor polish or wax that makes floors slippery. If you must use wax, use non-skid floor wax.  Do not have throw rugs and other things on the floor that can make you trip. What can I do with my stairs?  Do not leave any items on the stairs.  Make sure that there are handrails on both sides of the stairs and use them. Fix handrails that are broken or loose. Make sure that handrails are as long as the stairways.  Check any carpeting to make sure that it is firmly attached to the stairs. Fix any carpet that is loose or worn.  Avoid having throw rugs at the top or bottom of the stairs. If you do have throw rugs, attach them to the floor with carpet tape.  Make sure that you have a light switch at the top of the stairs and the bottom of the stairs. If you do not have them, ask someone to add them for you. What else can I do to help prevent falls?  Wear shoes that:  Do not have high  heels.  Have rubber bottoms.  Are comfortable and fit you well.  Are closed at the toe. Do not wear sandals.  If you use a stepladder:  Make sure that it is fully opened. Do not climb a closed stepladder.  Make sure that both sides of the stepladder are locked into place.  Ask someone to hold it for you, if possible.  Clearly mark and make sure that you can see:  Any grab bars or handrails.  First and last steps.  Where the edge of each step is.  Use tools that help you move around (mobility aids) if they are needed. These include:  Canes.  Walkers.  Scooters.  Crutches.  Turn on the lights when you go into a dark area. Replace any light bulbs as soon as they burn out.  Set up your furniture so you have a clear path. Avoid moving your furniture around.  If any of your floors are uneven, fix them.  If there are any pets around you, be aware of where they are.  Review your medicines with your doctor. Some medicines can make you feel dizzy. This can increase your chance of falling. Ask your doctor what other things that you can do to help prevent falls. This information is not intended to replace advice given to you by your health care provider. Make sure you discuss any questions you have with your health care provider. Document Released: 08/21/2009 Document Revised: 04/01/2016 Document Reviewed: 11/29/2014 Elsevier Interactive Patient Education  2017 [employer].

## 2018-02-23 NOTE — Patient Instructions (Addendum)

## 2018-02-23 NOTE — Progress Notes (Signed)
Subjective:   Jeff [name] is a 67 y.o. male who presents for Medicare Annual/Subsequent preventive examination.  Review of Systems:   Cardiac Risk Factors include: hypertension;advanced age (>6155men, 7>65 women);male gender;dyslipidemia;obesity (BMI >30kg/m2);diabetes mellitus     Objective:    Vitals: BP (!) 168/98 (BP Location: Left Arm, Patient Position: Sitting)   Pulse 79   Temp 98.2 F (36.8 C) (Temporal)   Resp 16   Ht 5\' 9"  (1.753 m)   Wt 281 lb 12.8 oz (127.8 kg)   BMI 41.61 kg/m   Body mass index is 41.61 kg/m.  Advanced Directives 02/23/2018 11/20/2017 11/20/2017 05/28/2016  Does Patient Have a Medical Advance Directive? No No No No  Would patient like information on creating a medical advance directive? No - Patient declined No - Patient declined - No - patient declined information    Tobacco Social History   Tobacco Use  Smoking Status Never Smoker  Smokeless Tobacco Never Used     Counseling given: Not Answered   Clinical Intake:  Pre-visit preparation completed: Yes  Pain : No/denies pain     Nutritional Status: BMI > 30  Obese Nutritional Risks: None Diabetes: Yes CBG done?: No Did pt. bring in CBG monitor from home?: No  How often do you need to have someone help you when you read instructions, pamphlets, or other written materials from your doctor or pharmacy?: 1 - Never What is the last grade level you completed in school?: college   Interpreter Needed?: No  Information entered by :: Eagle Pitta,LPN   Past Medical History:  Diagnosis Date  . Atrial fibrillation (HCC)   . Chronic low back pain   . Chronic systolic heart failure (HCC)    with EF of 40-45% by echocardiogram, liekly tachycardia induced cariomyopathy  . Diabetes mellitus without complication (HCC)   . DVT of lower limb, acute (HCC) 2014   off anti-coagulation  . Erectile dysfunction   . HLD (hyperlipidemia)   . Hypertension   . Hypogonadism in male    Past  Surgical History:  Procedure Laterality Date  . COLONOSCOPY WITH PROPOFOL N/A 05/28/2016   Procedure: COLONOSCOPY WITH PROPOFOL;  Surgeon: Midge Miniumarren Wohl, MD;  [facility];  Service: Endoscopy;  Laterality: N/A;  diabetic - oral meds  . DVT, leg Left   . INSERTION OF VENA CAVA FILTER  2014  . PERIPHERAL VASCULAR THROMBECTOMY Left 12/01/2017   Procedure: PERIPHERAL VASCULAR THROMBECTOMY;  Surgeon: Renford DillsSchnier, Gregory G, MD;  Location: [facility];  Service: Cardiovascular;  Laterality: Left;   Family History  Problem Relation Age of Onset  . Lymphoma Mother   . Heart failure Father   . Diabetes Father   . Diabetes Mellitus II Unknown   . Heart attack Neg Hx   . Hypertension Neg Hx   . Cancer Neg Hx   . COPD Neg Hx   . Stroke Neg Hx   . Prostate cancer Neg Hx   . Kidney cancer Neg Hx   . Bladder Cancer Neg Hx    Social History   Socioeconomic History  . Marital status: Significant Other    Spouse name: Not on file  . Number of children: Not on file  . Years of education: Not on file  . Highest education level: Not on file  Occupational History  . Not on file  Social Needs  . Financial resource strain: Not hard at all  . Food insecurity:    Worry: Never true  Inability: Never true  . Transportation needs:    Medical: No    Non-medical: No  Tobacco Use  . Smoking status: Never Smoker  . Smokeless tobacco: Never Used  Substance and Sexual Activity  . Alcohol use: No    Alcohol/week: 0.0 oz  . Drug use: No  . Sexual activity: Not on file  Lifestyle  . Physical activity:    Days per week: 3 days    Minutes per session: 150+ min  . Stress: Not at all  Relationships  . Social connections:    Talks on phone: Twice a week    Gets together: More than three times a week    Attends religious service: More than 4 times per year    Active member of club or organization: Yes    Attends meetings of clubs or organizations: More than 4 times per year     Relationship status: Living with partner  Other Topics Concern  . Not on file  Social History Narrative   Former Emergency planning/management officer    Independent at baseline. [employer] club    Outpatient Encounter Medications as of 02/23/2018  Medication Sig  . amLODipine (NORVASC) 10 MG tablet TAKE 1 TABLET (10 MG TOTAL) BY MOUTH DAILY.  Marland Kitchen aspirin EC 325 MG tablet Take by mouth.  Marland Kitchen atorvastatin (LIPITOR) 10 MG tablet TAKE 1 TABLET (10 MG TOTAL) BY MOUTH DAILY.  . benzonatate (TESSALON) 100 MG capsule Take 1 capsule (100 mg total) by mouth 2 (two) times daily as needed for cough. Must have office visit to get Tussionex  . digoxin (LANOXIN) 0.25 MG tablet Take 1 tablet (0.25 mg total) by mouth daily.  . fluticasone (FLONASE) 50 MCG/ACT nasal spray Place 2 sprays into both nostrils daily.  Marland Kitchen glucose blood (CONTOUR NEXT TEST) test strip Use as instructed  . metFORMIN (GLUCOPHAGE) 1000 MG tablet Take 1 tablet (1,000 mg total) by mouth 2 (two) times daily with a meal.  . metoprolol tartrate (LOPRESSOR) 50 MG tablet TAKE 1 TABLET (50 MG TOTAL) BY MOUTH 2 (TWO) TIMES DAILY.  Marland Kitchen predniSONE (DELTASONE) 50 MG tablet TAKE 1 TABLET (50 MG TOTAL) BY MOUTH DAILY WITH BREAKFAST.  . rivaroxaban (XARELTO) 20 MG TABS tablet Take 1 tablet (20 mg total) by mouth daily with supper.  . traMADol (ULTRAM) 50 MG tablet One to Two Tabs Every Six Hours As Needed For Pain  . [DISCONTINUED] Rivaroxaban (XARELTO) 15 MG TABS tablet Take 1 tablet (15 mg total) by mouth 2 (two) times daily with a meal.  . [DISCONTINUED] atorvastatin (LIPITOR) 10 MG tablet take 1 tablet by mouth once daily (Patient not taking: Reported on 02/23/2018)  . [DISCONTINUED] metFORMIN (GLUCOPHAGE) 1000 MG tablet TAKE 1 TABLET BY MOUTH TWICE A DAY WITH A MEAL  . [DISCONTINUED] metFORMIN (GLUCOPHAGE) 1000 MG tablet TAKE 1 TABLET BY MOUTH TWICE A DAY WITH A MEAL   No facility-administered encounter medications on file as of 02/23/2018.     Activities of Daily  Living In your present state of health, do you have any difficulty performing the following activities: 02/23/2018 11/20/2017  Hearing? N N  Vision? N N  Difficulty concentrating or making decisions? N N  Walking or climbing stairs? N N  Dressing or bathing? N N  Doing errands, shopping? N N  Preparing Food and eating ? N -  Using the Toilet? N -  In the past six months, have you accidently leaked urine? N -  Do you have problems with loss  of bowel control? N -  Managing your Medications? N -  Managing your Finances? N -  Housekeeping or managing your Housekeeping? N -  Some recent data might be hidden    Patient Care Team: Steele Sizer, MD as PCP - General Mariah Milling Tollie Pizza, MD as Consulting Physician (Cardiology)   Assessment:   This is a routine wellness examination for Asaf.  Exercise Activities and Dietary recommendations Current Exercise Habits: Structured exercise class, Time (Minutes): 60, Frequency (Times/Week): 3, Weekly Exercise (Minutes/Week): 180, Intensity: Moderate, Exercise limited by: None identified  Goals    . DIET - INCREASE WATER INTAKE     Recommend drinking at least 6-8 glasses of water a day        Fall Risk Fall Risk  02/23/2018 12/08/2017 09/07/2017 12/20/2016 10/04/2016  Falls in the past year? No No No No No   Is the patient's home free of loose throw rugs in walkways, pet beds, electrical cords, etc?   yes      Grab bars in the bathroom? yes      Handrails on the stairs?   yes      Adequate lighting?   yes  Timed Get Up and Go Performed: Completed in 8 seconds with no use of assistive devices, steady gait. No intervention needed at this time.   Depression Screen PHQ 2/9 Scores 02/23/2018 12/08/2017 09/07/2017 12/20/2016  PHQ - 2 Score 0 0 0 0    Cognitive Function        Immunization History  Administered Date(s) Administered  . Influenza, High Dose Seasonal PF 10/04/2016, 09/07/2017  . Influenza,inj,Quad PF,6+ Mos 09/01/2015  .  Pneumococcal Conjugate-13 10/04/2016  . Pneumococcal Polysaccharide-23 07/30/2009, 02/23/2018  . Zoster 03/18/2014    Qualifies for Shingles Vaccine? Yes, discussed shingrix vaccine   Screening Tests Health Maintenance  Topic Date Due  . URINE MICROALBUMIN  12/31/2015  . OPHTHALMOLOGY EXAM  01/27/2016  . FOOT EXAM  01/18/2017  . HEMOGLOBIN A1C  05/20/2018  . INFLUENZA VACCINE  06/08/2018  . TETANUS/TDAP  12/09/2018  . COLONOSCOPY  05/28/2026  . Hepatitis C Screening  Completed  . PNA vac Low Risk Adult  Completed   Cancer Screenings: Lung: Low Dose CT Chest recommended if Age 66-80 years, 30 pack-year currently smoking OR have quit w/in 15years. Patient does not qualify. Colorectal: completed 05/28/2016  Additional Screenings:  Hepatitis C Screening: done today       Plan:    I have personally reviewed and addressed the Medicare Annual Wellness questionnaire and have noted the following in the patient's chart:  A. Medical and social history B. Use of alcohol, tobacco or illicit drugs  C. Current medications and supplements D. Functional ability and status E.  Nutritional status F.  Physical activity G. Advance directives H. List of other physicians I.  Hospitalizations, surgeries, and ER visits in previous 12 months J.  Vitals K. Screenings such as hearing and vision if needed, cognitive and depression L. Referrals and appointments   In addition, I have reviewed and discussed with patient certain preventive protocols, quality metrics, and best practice recommendations. A written personalized care plan for preventive services as well as general preventive health recommendations were provided to patient.   Signed,  Marin Roberts, LPN Nurse Health Advisor   Nurse Notes:due for diabetic foot exam- scheduled to see Dr.Crissman on 03/02/2018.  Completed diabetic eye exam done at [facility] Stann Mainland request results.

## 2018-02-24 LAB — CBC WITH DIFFERENTIAL/PLATELET
BASOS ABS: 0 10*3/uL (ref 0.0–0.2)
Basos: 0 %
EOS (ABSOLUTE): 0 10*3/uL (ref 0.0–0.4)
EOS: 0 %
HEMATOCRIT: 46.2 % (ref 37.5–51.0)
HEMOGLOBIN: 14.6 g/dL (ref 13.0–17.7)
IMMATURE GRANS (ABS): 0.1 10*3/uL (ref 0.0–0.1)
Immature Granulocytes: 1 %
LYMPHS: 9 %
Lymphocytes Absolute: 0.9 10*3/uL (ref 0.7–3.1)
MCH: 27.4 pg (ref 26.6–33.0)
MCHC: 31.6 g/dL (ref 31.5–35.7)
MCV: 87 fL (ref 79–97)
MONOCYTES: 3 %
Monocytes Absolute: 0.3 10*3/uL (ref 0.1–0.9)
NEUTROS ABS: 8.5 10*3/uL — AB (ref 1.4–7.0)
Neutrophils: 87 %
Platelets: 246 10*3/uL (ref 150–379)
RBC: 5.32 x10E6/uL (ref 4.14–5.80)
RDW: 13.4 % (ref 12.3–15.4)
WBC: 9.9 10*3/uL (ref 3.4–10.8)

## 2018-02-24 LAB — COMPREHENSIVE METABOLIC PANEL
ALBUMIN: 4.8 g/dL (ref 3.6–4.8)
ALT: 20 IU/L (ref 0–44)
AST: 22 IU/L (ref 0–40)
Albumin/Globulin Ratio: 2.5 — ABNORMAL HIGH (ref 1.2–2.2)
Alkaline Phosphatase: 66 IU/L (ref 39–117)
BUN / CREAT RATIO: 13 (ref 10–24)
BUN: 18 mg/dL (ref 8–27)
Bilirubin Total: 0.4 mg/dL (ref 0.0–1.2)
CO2: 24 mmol/L (ref 20–29)
CREATININE: 1.43 mg/dL — AB (ref 0.76–1.27)
Calcium: 9.8 mg/dL (ref 8.6–10.2)
Chloride: 95 mmol/L — ABNORMAL LOW (ref 96–106)
GFR calc non Af Amer: 50 mL/min/{1.73_m2} — ABNORMAL LOW (ref >59)
GFR, EST AFRICAN AMERICAN: 58 mL/min/{1.73_m2} — AB (ref >59)
GLUCOSE: 223 mg/dL — AB (ref 65–99)
Globulin, Total: 1.9 g/dL (ref 1.5–4.5)
Potassium: 5.9 mmol/L — ABNORMAL HIGH (ref 3.5–5.2)
Sodium: 135 mmol/L (ref 134–144)
TOTAL PROTEIN: 6.7 g/dL (ref 6.0–8.5)

## 2018-02-24 LAB — LIPID PANEL W/O CHOL/HDL RATIO
Cholesterol, Total: 104 mg/dL (ref 100–199)
HDL: 39 mg/dL — ABNORMAL LOW (ref >39)
LDL CALC: 49 mg/dL (ref 0–99)
Triglycerides: 80 mg/dL (ref 0–149)
VLDL CHOLESTEROL CAL: 16 mg/dL (ref 5–40)

## 2018-02-24 LAB — TSH: TSH: 2.48 u[IU]/mL (ref 0.450–4.500)

## 2018-02-24 LAB — PSA: Prostate Specific Ag, Serum: 2.4 ng/mL (ref 0.0–4.0)

## 2018-02-24 LAB — HEPATITIS C ANTIBODY

## 2018-02-27 ENCOUNTER — Telehealth: Payer: Self-pay | Admitting: Family Medicine

## 2018-02-27 NOTE — Telephone Encounter (Signed)
-----   Message from Richarda OverlieJada A Fox, [place] sent at 02/27/2018 11:57 AM EDT ----- Patient was transferred to provider for telephone conversation.

## 2018-02-27 NOTE — Telephone Encounter (Signed)
Phone call Discussed with patient elevated potassium just got off prednisone today.  For sinusitis. Will check BMP at office visit Thursday.

## 2018-03-02 ENCOUNTER — Encounter: Payer: Self-pay | Admitting: Family Medicine

## 2018-03-02 ENCOUNTER — Ambulatory Visit (INDEPENDENT_AMBULATORY_CARE_PROVIDER_SITE_OTHER): Payer: Medicare HMO | Admitting: Family Medicine

## 2018-03-02 VITALS — BP 138/87 | HR 90 | Ht 69.0 in | Wt 278.9 lb

## 2018-03-02 DIAGNOSIS — I420 Dilated cardiomyopathy: Secondary | ICD-10-CM

## 2018-03-02 DIAGNOSIS — E1159 Type 2 diabetes mellitus with other circulatory complications: Secondary | ICD-10-CM | POA: Diagnosis not present

## 2018-03-02 DIAGNOSIS — I82412 Acute embolism and thrombosis of left femoral vein: Secondary | ICD-10-CM | POA: Diagnosis not present

## 2018-03-02 DIAGNOSIS — Z0000 Encounter for general adult medical examination without abnormal findings: Secondary | ICD-10-CM | POA: Diagnosis not present

## 2018-03-02 DIAGNOSIS — E782 Mixed hyperlipidemia: Secondary | ICD-10-CM | POA: Diagnosis not present

## 2018-03-02 DIAGNOSIS — Z7189 Other specified counseling: Secondary | ICD-10-CM

## 2018-03-02 DIAGNOSIS — I10 Essential (primary) hypertension: Secondary | ICD-10-CM

## 2018-03-02 MED ORDER — METOPROLOL TARTRATE 50 MG PO TABS: 50.0000 mg | ORAL_TABLET | Freq: Two times a day (BID) | ORAL | 4 refills | Status: DC

## 2018-03-02 MED ORDER — METFORMIN HCL 1000 MG PO TABS: 1000.0000 mg | ORAL_TABLET | Freq: Two times a day (BID) | ORAL | 4 refills | Status: DC

## 2018-03-02 MED ORDER — ATORVASTATIN CALCIUM 10 MG PO TABS: 10.0000 mg | ORAL_TABLET | Freq: Every day | ORAL | 4 refills | Status: DC

## 2018-03-02 MED ORDER — AMLODIPINE BESYLATE 10 MG PO TABS: 10.0000 mg | ORAL_TABLET | Freq: Every day | ORAL | 4 refills | Status: DC

## 2018-03-02 NOTE — Assessment & Plan Note (Signed)
The current medical regimen is effective;  continue present plan and medications.  

## 2018-03-02 NOTE — Assessment & Plan Note (Signed)
Stable after IVC removal

## 2018-03-02 NOTE — Assessment & Plan Note (Signed)
Followed by cardiology 

## 2018-03-02 NOTE — Progress Notes (Signed)
BP 138/87   Pulse 90   Ht _0  (1.753 m)   Wt 278 lb 14.4 oz (126.5 kg)   SpO2 98%   BMI 41.19 kg/m    Subjective:    Patient ID: Jeff [name], male    DOB: 1950-12-21, 67 y.o.   MRN: [id number]  HPI: Jeff [name] is a 67 y.o. male  Chief Complaint  Patient presents with  . Annual Exam  Follow-up all in all doing well had difficult time with no anesthesia for catheterization procedure for clot and intravascular filter removal.  This was done successfully in February. Blood pressures remained stable diabetes stable without issues. Issues are stable concerned about forming new blood clots in the legs and followed by cardiology may go back to see vascular to reconsider filter placement.  Relevant past medical, surgical, family and social history reviewed and updated as indicated. Interim medical history since our last visit reviewed. Allergies and medications reviewed and updated.  Review of Systems  Constitutional: Negative.   HENT: Negative.   Eyes: Negative.   Respiratory: Negative.   Cardiovascular: Negative.   Gastrointestinal: Negative.   Endocrine: Negative.   Genitourinary: Negative.   Musculoskeletal: Negative.   Skin: Negative.   Allergic/Immunologic: Negative.   Neurological: Negative.   Hematological: Negative.   Psychiatric/Behavioral: Negative.     Per HPI unless specifically indicated above     Objective:    BP 138/87   Pulse 90   Ht _1  (1.753 m)   Wt 278 lb 14.4 oz (126.5 kg)   SpO2 98%   BMI 41.19 kg/m   Wt Readings from Last 3 Encounters:  03/02/18 278 lb 14.4 oz (126.5 kg)  02/23/18 279 lb 12 oz (126.9 kg)  02/23/18 281 lb 12.8 oz (127.8 kg)    Physical Exam  Constitutional: He is oriented to person, place, and time. He appears well-developed and well-nourished.  HENT:  Head: Normocephalic and atraumatic.  Right Ear: External ear normal.  Left Ear: External ear normal.  Eyes: Pupils are equal, round, and reactive to light.  Conjunctivae and EOM are normal.  Neck: Normal range of motion. Neck supple.  Cardiovascular: Normal rate, regular rhythm, normal heart sounds and intact distal pulses.  Pulmonary/Chest: Effort normal and breath sounds normal.  Abdominal: Soft. Bowel sounds are normal. There is no splenomegaly or hepatomegaly.  Genitourinary: Rectum normal, prostate normal and penis normal.  Musculoskeletal: Normal range of motion.  Neurological: He is alert and oriented to person, place, and time. He has normal reflexes.  Skin: No rash noted. No erythema.  Psychiatric: He has a normal mood and affect. His behavior is normal. Judgment and thought content normal.    Results for orders placed or performed in visit on 02/23/18  Microscopic Examination  Result Value Ref Range   WBC, UA 0-5 0 - 5 /hpf   RBC, UA 0-2 0 - 2 /hpf   Epithelial Cells (non renal) 0-10 0 - 10 /hpf   Bacteria, UA None seen None seen/Few  CBC with Differential  Result Value Ref Range   WBC 9.9 3.4 - 10.8 x10E3/uL   RBC 5.32 4.14 - 5.80 x10E6/uL   Hemoglobin 14.6 13.0 - 17.7 g/dL   Hematocrit 46.2 37.5 - 51.0 %   MCV 87 79 - 97 fL   MCH 27.4 26.6 - 33.0 pg   MCHC 31.6 31.5 - 35.7 g/dL   RDW 13.4 12.3 - 15.4 %   Platelets 246 150 - 379 x10E3/uL  Neutrophils 87 Not Estab. %   Lymphs 9 Not Estab. %   Monocytes 3 Not Estab. %   Eos 0 Not Estab. %   Basos 0 Not Estab. %   Neutrophils Absolute 8.5 (H) 1.4 - 7.0 x10E3/uL   Lymphocytes Absolute 0.9 0.7 - 3.1 x10E3/uL   Monocytes Absolute 0.3 0.1 - 0.9 x10E3/uL   EOS (ABSOLUTE) 0.0 0.0 - 0.4 x10E3/uL   Basophils Absolute 0.0 0.0 - 0.2 x10E3/uL   Immature Granulocytes 1 Not Estab. %   Immature Grans (Abs) 0.1 0.0 - 0.1 x10E3/uL  Comp Met (CMET)  Result Value Ref Range   Glucose 223 (H) 65 - 99 mg/dL   BUN 18 8 - 27 mg/dL   Creatinine, Ser 1.43 (H) 0.76 - 1.27 mg/dL   GFR calc non Af Amer 50 (L) >59 mL/min/1.73   GFR calc Af Amer 58 (L) >59 mL/min/1.73   BUN/Creatinine Ratio  13 10 - 24   Sodium 135 134 - 144 mmol/L   Potassium 5.9 (H) 3.5 - 5.2 mmol/L   Chloride 95 (L) 96 - 106 mmol/L   CO2 24 20 - 29 mmol/L   Calcium 9.8 8.6 - 10.2 mg/dL   Total Protein 6.7 6.0 - 8.5 g/dL   Albumin 4.8 3.6 - 4.8 g/dL   Globulin, Total 1.9 1.5 - 4.5 g/dL   Albumin/Globulin Ratio 2.5 (H) 1.2 - 2.2   Bilirubin Total 0.4 0.0 - 1.2 mg/dL   Alkaline Phosphatase 66 39 - 117 IU/L   AST 22 0 - 40 IU/L   ALT 20 0 - 44 IU/L  Lipid Panel w/o Chol/HDL Ratio  Result Value Ref Range   Cholesterol, Total 104 100 - 199 mg/dL   Triglycerides 80 0 - 149 mg/dL   HDL 39 (L) >39 mg/dL   VLDL Cholesterol Cal 16 5 - 40 mg/dL   LDL Calculated 49 0 - 99 mg/dL  PSA  Result Value Ref Range   Prostate Specific Ag, Serum 2.4 0.0 - 4.0 ng/mL  TSH  Result Value Ref Range   TSH 2.480 0.450 - 4.500 uIU/mL  Bayer DCA Hb A1c Waived (STAT)  Result Value Ref Range   Bayer DCA Hb A1c Waived 7.2 (H) <7.0 %  Hepatitis C antibody screen  Result Value Ref Range   Hep C Virus Ab <0.1 0.0 - 0.9 s/co ratio  Urinalysis, Routine w reflex microscopic  Result Value Ref Range   Specific Gravity, UA 1.010 1.005 - 1.030   pH, UA 6.0 5.0 - 7.5   Color, UA Yellow Yellow   Appearance Ur Clear Clear   Leukocytes, UA Negative Negative   Protein, UA Negative Negative/Trace   Glucose, UA 3+ (A) Negative   Ketones, UA Negative Negative   RBC, UA Trace (A) Negative   Bilirubin, UA Negative Negative   Urobilinogen, Ur 0.2 0.2 - 1.0 mg/dL   Nitrite, UA Negative Negative   Microscopic Examination See below:       Assessment & Plan:   Problem List Items Addressed This Visit      Cardiovascular and Mediastinum   HYPERTENSION, BENIGN    The current medical regimen is effective;  continue present plan and medications.       Relevant Medications   metoprolol tartrate (LOPRESSOR) 50 MG tablet   atorvastatin (LIPITOR) 10 MG tablet   amLODipine (NORVASC) 10 MG tablet   Congestive dilated cardiomyopathy ([place])     Followed by cardiology      Relevant Medications  metoprolol tartrate (LOPRESSOR) 50 MG tablet   atorvastatin (LIPITOR) 10 MG tablet   amLODipine (NORVASC) 10 MG tablet   Acute deep vein thrombosis (DVT) of femoral vein of left lower extremity (HCC)    Stable after IVC removal      Relevant Medications   metoprolol tartrate (LOPRESSOR) 50 MG tablet   atorvastatin (LIPITOR) 10 MG tablet   amLODipine (NORVASC) 10 MG tablet     Endocrine   Diabetes (HCC)    The current medical regimen is effective;  continue present plan and medications.       Relevant Medications   metFORMIN (GLUCOPHAGE) 1000 MG tablet   atorvastatin (LIPITOR) 10 MG tablet     Other   Hyperlipidemia    The current medical regimen is effective;  continue present plan and medications.       Relevant Medications   metoprolol tartrate (LOPRESSOR) 50 MG tablet   atorvastatin (LIPITOR) 10 MG tablet   amLODipine (NORVASC) 10 MG tablet   Advanced care planning/counseling discussion - Primary    A voluntary discussion about advance care planning including the explanation and discussion of advance directives was extensively discussed  with the patient.  Explanation about the health care proxy and Living will was reviewed and packet with forms with explanation of how to fill them out was given. .  Time spent: encounter 16+ min       Individuals present pt           Follow up plan: Return in about 6 months (around 09/01/2018) for BMP,  Lipids, ALT, AST, Hemoglobin A1c.

## 2018-03-02 NOTE — Assessment & Plan Note (Signed)
A voluntary discussion about advance care planning including the explanation and discussion of advance directives was extensively discussed  with the patient.  Explanation about the health care proxy and Living will was reviewed and packet with forms with explanation of how to fill them out was given.    Time spent: encounter 16+ min       Individuals present: pt  

## 2018-05-18 DIAGNOSIS — I82220 Acute embolism and thrombosis of inferior vena cava: Secondary | ICD-10-CM | POA: Diagnosis not present

## 2018-05-22 DIAGNOSIS — I871 Compression of vein: Secondary | ICD-10-CM | POA: Insufficient documentation

## 2018-05-25 ENCOUNTER — Encounter: Payer: Self-pay | Admitting: Family Medicine

## 2018-05-25 ENCOUNTER — Ambulatory Visit (INDEPENDENT_AMBULATORY_CARE_PROVIDER_SITE_OTHER): Payer: Medicare HMO | Admitting: Family Medicine

## 2018-05-25 VITALS — BP 149/104 | HR 87 | Ht 66.0 in | Wt 287.0 lb

## 2018-05-25 DIAGNOSIS — E669 Obesity, unspecified: Secondary | ICD-10-CM

## 2018-05-25 DIAGNOSIS — I82220 Acute embolism and thrombosis of inferior vena cava: Secondary | ICD-10-CM

## 2018-05-25 DIAGNOSIS — E782 Mixed hyperlipidemia: Secondary | ICD-10-CM | POA: Diagnosis not present

## 2018-05-25 DIAGNOSIS — E119 Type 2 diabetes mellitus without complications: Secondary | ICD-10-CM

## 2018-05-25 DIAGNOSIS — I420 Dilated cardiomyopathy: Secondary | ICD-10-CM

## 2018-05-25 DIAGNOSIS — I10 Essential (primary) hypertension: Secondary | ICD-10-CM | POA: Diagnosis not present

## 2018-05-25 LAB — BAYER DCA HB A1C WAIVED: HB A1C (BAYER DCA - WAIVED): 7.8 % — ABNORMAL HIGH

## 2018-05-25 MED ORDER — BENAZEPRIL HCL 20 MG PO TABS: 20.0000 mg | ORAL_TABLET | Freq: Every day | ORAL | 1 refills | Status: DC

## 2018-05-25 NOTE — Assessment & Plan Note (Signed)
Followed by cardiology 

## 2018-05-25 NOTE — Assessment & Plan Note (Signed)
The current medical regimen is effective;  continue present plan and medications.  

## 2018-05-25 NOTE — Progress Notes (Signed)
BP (!) 149/104   Pulse 87   Ht 5' 6"  (1.676 m)   Wt 287 lb (130.2 kg)   SpO2 98%   BMI 46.32 kg/m    Subjective:    Patient ID: Jeff [name], male    DOB: 08-Apr-1951, 67 y.o.   MRN: [id number]  HPI: Jeff [name] is a 67 y.o. male  Chief Complaint  Patient presents with  . Consult    Imaging done at [facility].   Patient multiple concerns, primarily concerned about imaging procedure done at [facility] pain associated.  Reviewed again with patient especially about pulmonary nodule patient has follow-up imaging scheduled to review his pulmonary nodule and blood clotting. Diabetes doing well No low blood sugar spells no issues. Does relate eating 2 small cartons of yogurt each evening prior to bed which is a new change Taking blood pressure medicine without problems. Cholesterol also doing well  Relevant past medical, surgical, family and social history reviewed and updated as indicated. Interim medical history since our last visit reviewed. Allergies and medications reviewed and updated.  Review of Systems  Constitutional: Negative.   Respiratory: Negative.   Cardiovascular: Negative.     Per HPI unless specifically indicated above     Objective:    BP (!) 149/104   Pulse 87   Ht 5' 6"  (1.676 m)   Wt 287 lb (130.2 kg)   SpO2 98%   BMI 46.32 kg/m   Wt Readings from Last 3 Encounters:  05/25/18 287 lb (130.2 kg)  03/02/18 278 lb 14.4 oz (126.5 kg)  02/23/18 279 lb 12 oz (126.9 kg)    Physical Exam  Constitutional: He is oriented to person, place, and time. He appears well-developed and well-nourished.  HENT:  Head: Normocephalic and atraumatic.  Eyes: Conjunctivae and EOM are normal.  Neck: Normal range of motion.  Cardiovascular: Normal rate, regular rhythm and normal heart sounds.  Pulmonary/Chest: Effort normal and breath sounds normal.  Musculoskeletal: Normal range of motion.  Neurological: He is alert and oriented to person, place, and time.  Skin: No  erythema.  Psychiatric: He has a normal mood and affect. His behavior is normal. Judgment and thought content normal.    Results for orders placed or performed in visit on 02/23/18  Microscopic Examination  Result Value Ref Range   WBC, UA 0-5 0 - 5 /hpf   RBC, UA 0-2 0 - 2 /hpf   Epithelial Cells (non renal) 0-10 0 - 10 /hpf   Bacteria, UA None seen None seen/Few  CBC with Differential  Result Value Ref Range   WBC 9.9 3.4 - 10.8 x10E3/uL   RBC 5.32 4.14 - 5.80 x10E6/uL   Hemoglobin 14.6 13.0 - 17.7 g/dL   Hematocrit 46.2 37.5 - 51.0 %   MCV 87 79 - 97 fL   MCH 27.4 26.6 - 33.0 pg   MCHC 31.6 31.5 - 35.7 g/dL   RDW 13.4 12.3 - 15.4 %   Platelets 246 150 - 379 x10E3/uL   Neutrophils 87 Not Estab. %   Lymphs 9 Not Estab. %   Monocytes 3 Not Estab. %   Eos 0 Not Estab. %   Basos 0 Not Estab. %   Neutrophils Absolute 8.5 (H) 1.4 - 7.0 x10E3/uL   Lymphocytes Absolute 0.9 0.7 - 3.1 x10E3/uL   Monocytes Absolute 0.3 0.1 - 0.9 x10E3/uL   EOS (ABSOLUTE) 0.0 0.0 - 0.4 x10E3/uL   Basophils Absolute 0.0 0.0 - 0.2 x10E3/uL   Immature  Granulocytes 1 Not Estab. %   Immature Grans (Abs) 0.1 0.0 - 0.1 x10E3/uL  Comp Met (CMET)  Result Value Ref Range   Glucose 223 (H) 65 - 99 mg/dL   BUN 18 8 - 27 mg/dL   Creatinine, Ser 1.43 (H) 0.76 - 1.27 mg/dL   GFR calc non Af Amer 50 (L) >59 mL/min/1.73   GFR calc Af Amer 58 (L) >59 mL/min/1.73   BUN/Creatinine Ratio 13 10 - 24   Sodium 135 134 - 144 mmol/L   Potassium 5.9 (H) 3.5 - 5.2 mmol/L   Chloride 95 (L) 96 - 106 mmol/L   CO2 24 20 - 29 mmol/L   Calcium 9.8 8.6 - 10.2 mg/dL   Total Protein 6.7 6.0 - 8.5 g/dL   Albumin 4.8 3.6 - 4.8 g/dL   Globulin, Total 1.9 1.5 - 4.5 g/dL   Albumin/Globulin Ratio 2.5 (H) 1.2 - 2.2   Bilirubin Total 0.4 0.0 - 1.2 mg/dL   Alkaline Phosphatase 66 39 - 117 IU/L   AST 22 0 - 40 IU/L   ALT 20 0 - 44 IU/L  Lipid Panel w/o Chol/HDL Ratio  Result Value Ref Range   Cholesterol, Total 104 100 - 199 mg/dL     Triglycerides 80 0 - 149 mg/dL   HDL 39 (L) >39 mg/dL   VLDL Cholesterol Cal 16 5 - 40 mg/dL   LDL Calculated 49 0 - 99 mg/dL  PSA  Result Value Ref Range   Prostate Specific Ag, Serum 2.4 0.0 - 4.0 ng/mL  TSH  Result Value Ref Range   TSH 2.480 0.450 - 4.500 uIU/mL  Bayer DCA Hb A1c Waived (STAT)  Result Value Ref Range   HB A1C (BAYER DCA - WAIVED) 7.2 (H) <7.0 %  Hepatitis C antibody screen  Result Value Ref Range   Hep C Virus Ab <0.1 0.0 - 0.9 s/co ratio  Urinalysis, Routine w reflex microscopic  Result Value Ref Range   Specific Gravity, UA 1.010 1.005 - 1.030   pH, UA 6.0 5.0 - 7.5   Color, UA Yellow Yellow   Appearance Ur Clear Clear   Leukocytes, UA Negative Negative   Protein, UA Negative Negative/Trace   Glucose, UA 3+ (A) Negative   Ketones, UA Negative Negative   RBC, UA Trace (A) Negative   Bilirubin, UA Negative Negative   Urobilinogen, Ur 0.2 0.2 - 1.0 mg/dL   Nitrite, UA Negative Negative   Microscopic Examination See below:       Assessment & Plan:   Problem List Items Addressed This Visit      Cardiovascular and Mediastinum   HYPERTENSION, BENIGN    Poor control will add Benzapril 20 mg once a day      Relevant Medications   benazepril (LOTENSIN) 20 MG tablet   Congestive dilated cardiomyopathy (HCC)    Followed by cardiology      Relevant Medications   benazepril (LOTENSIN) 20 MG tablet   IVC thrombosis (HCC)    Followed by [facility]      Relevant Medications   benazepril (LOTENSIN) 20 MG tablet     Endocrine   Non-insulin dependent type 2 diabetes mellitus ([place]) - Primary    Discuss poor control of diabetes especially with the addition of yogurt.  Patient is going to be starting a serious diet as a consequence will not change medications at this time       Relevant Medications   benazepril (LOTENSIN) 20 MG tablet   Other  Relevant Orders   Bayer DCA Hb A1c Waived     Other   Hyperlipidemia    The current medical regimen is  effective;  continue present plan and medications.       Relevant Medications   benazepril (LOTENSIN) 20 MG tablet   Obesity (BMI 30-39.9)    Patient getting ready to start keto diet encouraged weight loss          Follow up plan: Return in about 3 months (around 08/25/2018) for Hemoglobin A1c, BMP,  Lipids, ALT, AST.

## 2018-05-25 NOTE — Assessment & Plan Note (Signed)
Followed by [facility]. 

## 2018-05-25 NOTE — Assessment & Plan Note (Signed)
Poor control will add Benzapril 20 mg once a day

## 2018-05-25 NOTE — Assessment & Plan Note (Signed)
Patient getting ready to start keto diet encouraged weight loss

## 2018-05-25 NOTE — Assessment & Plan Note (Addendum)
Discuss poor control of diabetes especially with the addition of yogurt.  Patient is going to be starting a serious diet as a consequence will not change medications at this time

## 2018-08-16 ENCOUNTER — Emergency Department: Payer: Medicare HMO

## 2018-08-16 ENCOUNTER — Other Ambulatory Visit: Payer: Self-pay

## 2018-08-16 ENCOUNTER — Inpatient Hospital Stay
Admission: EM | Admit: 2018-08-16 | Discharge: 2018-08-19 | DRG: 872 | Disposition: A | Discharge status: Home Health | Payer: Medicare HMO | Attending: Internal Medicine | Admitting: Internal Medicine

## 2018-08-16 DIAGNOSIS — N529 Male erectile dysfunction, unspecified: Secondary | ICD-10-CM | POA: Diagnosis present

## 2018-08-16 DIAGNOSIS — Z833 Family history of diabetes mellitus: Secondary | ICD-10-CM | POA: Diagnosis not present

## 2018-08-16 DIAGNOSIS — A408 Other streptococcal sepsis: Principal | ICD-10-CM | POA: Diagnosis present

## 2018-08-16 DIAGNOSIS — N182 Chronic kidney disease, stage 2 (mild): Secondary | ICD-10-CM | POA: Diagnosis present

## 2018-08-16 DIAGNOSIS — M545 Low back pain: Secondary | ICD-10-CM | POA: Diagnosis present

## 2018-08-16 DIAGNOSIS — Z6838 Body mass index (BMI) 38.0-38.9, adult: Secondary | ICD-10-CM

## 2018-08-16 DIAGNOSIS — A419 Sepsis, unspecified organism: Secondary | ICD-10-CM | POA: Diagnosis not present

## 2018-08-16 DIAGNOSIS — R52 Pain, unspecified: Secondary | ICD-10-CM | POA: Diagnosis not present

## 2018-08-16 DIAGNOSIS — Z95828 Presence of other vascular implants and grafts: Secondary | ICD-10-CM

## 2018-08-16 DIAGNOSIS — I517 Cardiomegaly: Secondary | ICD-10-CM | POA: Diagnosis not present

## 2018-08-16 DIAGNOSIS — N179 Acute kidney failure, unspecified: Secondary | ICD-10-CM | POA: Diagnosis not present

## 2018-08-16 DIAGNOSIS — Z807 Family history of other malignant neoplasms of lymphoid, hematopoietic and related tissues: Secondary | ICD-10-CM

## 2018-08-16 DIAGNOSIS — I10 Essential (primary) hypertension: Secondary | ICD-10-CM | POA: Diagnosis not present

## 2018-08-16 DIAGNOSIS — I5022 Chronic systolic (congestive) heart failure: Secondary | ICD-10-CM | POA: Diagnosis not present

## 2018-08-16 DIAGNOSIS — E1122 Type 2 diabetes mellitus with diabetic chronic kidney disease: Secondary | ICD-10-CM | POA: Diagnosis present

## 2018-08-16 DIAGNOSIS — J329 Chronic sinusitis, unspecified: Secondary | ICD-10-CM | POA: Diagnosis present

## 2018-08-16 DIAGNOSIS — Z8249 Family history of ischemic heart disease and other diseases of the circulatory system: Secondary | ICD-10-CM | POA: Diagnosis not present

## 2018-08-16 DIAGNOSIS — I482 Chronic atrial fibrillation, unspecified: Secondary | ICD-10-CM | POA: Diagnosis not present

## 2018-08-16 DIAGNOSIS — I82502 Chronic embolism and thrombosis of unspecified deep veins of left lower extremity: Secondary | ICD-10-CM | POA: Diagnosis not present

## 2018-08-16 DIAGNOSIS — R918 Other nonspecific abnormal finding of lung field: Secondary | ICD-10-CM | POA: Diagnosis not present

## 2018-08-16 DIAGNOSIS — Z7982 Long term (current) use of aspirin: Secondary | ICD-10-CM

## 2018-08-16 DIAGNOSIS — Z7901 Long term (current) use of anticoagulants: Secondary | ICD-10-CM

## 2018-08-16 DIAGNOSIS — R509 Fever, unspecified: Secondary | ICD-10-CM | POA: Diagnosis not present

## 2018-08-16 DIAGNOSIS — E669 Obesity, unspecified: Secondary | ICD-10-CM | POA: Diagnosis present

## 2018-08-16 DIAGNOSIS — I4821 Permanent atrial fibrillation: Secondary | ICD-10-CM | POA: Diagnosis not present

## 2018-08-16 DIAGNOSIS — E119 Type 2 diabetes mellitus without complications: Secondary | ICD-10-CM | POA: Diagnosis not present

## 2018-08-16 DIAGNOSIS — I509 Heart failure, unspecified: Secondary | ICD-10-CM | POA: Diagnosis not present

## 2018-08-16 DIAGNOSIS — Z452 Encounter for adjustment and management of vascular access device: Secondary | ICD-10-CM | POA: Diagnosis not present

## 2018-08-16 DIAGNOSIS — L03116 Cellulitis of left lower limb: Secondary | ICD-10-CM | POA: Diagnosis not present

## 2018-08-16 DIAGNOSIS — E291 Testicular hypofunction: Secondary | ICD-10-CM | POA: Diagnosis present

## 2018-08-16 DIAGNOSIS — I4819 Other persistent atrial fibrillation: Secondary | ICD-10-CM | POA: Diagnosis not present

## 2018-08-16 DIAGNOSIS — I130 Hypertensive heart and chronic kidney disease with heart failure and stage 1 through stage 4 chronic kidney disease, or unspecified chronic kidney disease: Secondary | ICD-10-CM | POA: Diagnosis present

## 2018-08-16 DIAGNOSIS — I4891 Unspecified atrial fibrillation: Secondary | ICD-10-CM | POA: Diagnosis not present

## 2018-08-16 DIAGNOSIS — I420 Dilated cardiomyopathy: Secondary | ICD-10-CM | POA: Diagnosis present

## 2018-08-16 DIAGNOSIS — G8929 Other chronic pain: Secondary | ICD-10-CM | POA: Diagnosis present

## 2018-08-16 DIAGNOSIS — R079 Chest pain, unspecified: Secondary | ICD-10-CM | POA: Diagnosis not present

## 2018-08-16 DIAGNOSIS — R600 Localized edema: Secondary | ICD-10-CM | POA: Diagnosis not present

## 2018-08-16 DIAGNOSIS — Z79899 Other long term (current) drug therapy: Secondary | ICD-10-CM

## 2018-08-16 HISTORY — DX: Permanent atrial fibrillation: I48.21

## 2018-08-16 HISTORY — DX: Chronic embolism and thrombosis of unspecified deep veins of unspecified lower extremity: I82.509

## 2018-08-16 LAB — COMPREHENSIVE METABOLIC PANEL
ALT: 18 U/L (ref 0–44)
AST: 24 U/L (ref 15–41)
Albumin: 4.7 g/dL (ref 3.5–5.0)
Alkaline Phosphatase: 54 U/L (ref 38–126)
Anion gap: 14 (ref 5–15)
BILIRUBIN TOTAL: 1.7 mg/dL — AB (ref 0.3–1.2)
BUN: 31 mg/dL — AB (ref 8–23)
CHLORIDE: 101 mmol/L (ref 98–111)
CO2: 23 mmol/L (ref 22–32)
CREATININE: 1.47 mg/dL — AB (ref 0.61–1.24)
Calcium: 9.2 mg/dL (ref 8.9–10.3)
GFR calc Af Amer: 55 mL/min — ABNORMAL LOW (ref >60)
GFR, EST NON AFRICAN AMERICAN: 48 mL/min — AB (ref >60)
Glucose, Bld: 169 mg/dL — ABNORMAL HIGH (ref 70–99)
Potassium: 4.8 mmol/L (ref 3.5–5.1)
Sodium: 138 mmol/L (ref 135–145)
TOTAL PROTEIN: 7.7 g/dL (ref 6.5–8.1)

## 2018-08-16 LAB — URINALYSIS, ROUTINE W REFLEX MICROSCOPIC
Bacteria, UA: NONE SEEN
Bilirubin Urine: NEGATIVE
GLUCOSE, UA: 50 mg/dL — AB
Ketones, ur: 20 mg/dL — AB
Leukocytes, UA: NEGATIVE
NITRITE: NEGATIVE
PH: 5 (ref 5.0–8.0)
Protein, ur: NEGATIVE mg/dL
SPECIFIC GRAVITY, URINE: 1.014 (ref 1.005–1.030)
Squamous Epithelial / LPF: NONE SEEN (ref 0–5)

## 2018-08-16 LAB — CBC WITH DIFFERENTIAL/PLATELET
Abs Immature Granulocytes: 0.04 10*3/uL (ref 0.00–0.07)
BASOS ABS: 0 10*3/uL (ref 0.0–0.1)
BASOS PCT: 0 %
EOS ABS: 0 10*3/uL (ref 0.0–0.5)
EOS PCT: 0 %
HCT: 47.5 % (ref 39.0–52.0)
HEMOGLOBIN: 15.8 g/dL (ref 13.0–17.0)
Immature Granulocytes: 0 %
LYMPHS PCT: 3 %
Lymphs Abs: 0.3 10*3/uL — ABNORMAL LOW (ref 0.7–4.0)
MCH: 29.3 pg (ref 26.0–34.0)
MCHC: 33.3 g/dL (ref 30.0–36.0)
MCV: 88.1 fL (ref 80.0–100.0)
MONO ABS: 0.4 10*3/uL (ref 0.1–1.0)
Monocytes Relative: 3 %
NRBC: 0 % (ref 0.0–0.2)
Neutro Abs: 10.6 10*3/uL — ABNORMAL HIGH (ref 1.7–7.7)
Neutrophils Relative %: 94 %
PLATELETS: 170 10*3/uL (ref 150–400)
RBC: 5.39 MIL/uL (ref 4.22–5.81)
RDW: 13 % (ref 11.5–15.5)
WBC: 11.4 10*3/uL — AB (ref 4.0–10.5)

## 2018-08-16 LAB — LACTIC ACID, PLASMA
LACTIC ACID, VENOUS: 1.8 mmol/L (ref 0.5–1.9)
LACTIC ACID, VENOUS: 1.4 mmol/L (ref 0.5–1.9)

## 2018-08-16 LAB — GLUCOSE, CAPILLARY: GLUCOSE-CAPILLARY: 151 mg/dL — AB (ref 70–99)

## 2018-08-16 LAB — INFLUENZA PANEL BY PCR (TYPE A & B)
INFLAPCR: NEGATIVE
Influenza B By PCR: NEGATIVE

## 2018-08-16 LAB — DIGOXIN LEVEL: Digoxin Level: <0.2 ng/mL — ABNORMAL LOW (ref 0.8–2.0)

## 2018-08-16 LAB — PROTIME-INR
INR: 1.53
Prothrombin Time: 18.3 seconds — ABNORMAL HIGH (ref 11.4–15.2)

## 2018-08-16 LAB — TROPONIN I

## 2018-08-16 MED ORDER — ACETAMINOPHEN 650 MG RE SUPP: 650.0000 mg | Freq: Four times a day (QID) | RECTAL | Status: DC | PRN

## 2018-08-16 MED ORDER — ACETAMINOPHEN 500 MG PO TABS
1000.0000 mg | ORAL_TABLET | Freq: Once | ORAL | Status: AC
  Administered 2018-08-16: 1000 mg via ORAL
  Filled 2018-08-16: qty 2

## 2018-08-16 MED ORDER — IBUPROFEN 600 MG PO TABS
600.0000 mg | ORAL_TABLET | Freq: Once | ORAL | Status: AC
  Administered 2018-08-16: 600 mg via ORAL

## 2018-08-16 MED ORDER — AMLODIPINE BESYLATE 10 MG PO TABS: 10.0000 mg | ORAL_TABLET | Freq: Every day | ORAL | Status: DC

## 2018-08-16 MED ORDER — ONDANSETRON HCL 4 MG PO TABS
4.0000 mg | ORAL_TABLET | Freq: Four times a day (QID) | ORAL | Status: DC | PRN
  Administered 2018-08-19: 4 mg via ORAL
  Filled 2018-08-16 (×2): qty 1

## 2018-08-16 MED ORDER — FLUTICASONE PROPIONATE 50 MCG/ACT NA SUSP
2.0000 | Freq: Every day | NASAL | Status: DC
  Filled 2018-08-16: qty 16

## 2018-08-16 MED ORDER — SODIUM CHLORIDE 0.9 % IV SOLN
INTRAVENOUS | Status: DC
  Administered 2018-08-17: via INTRAVENOUS

## 2018-08-16 MED ORDER — DIGOXIN 250 MCG PO TABS
0.2500 mg | ORAL_TABLET | Freq: Every day | ORAL | Status: DC
  Filled 2018-08-16 (×2): qty 1

## 2018-08-16 MED ORDER — IBUPROFEN 600 MG PO TABS
ORAL_TABLET | ORAL | Status: AC
  Filled 2018-08-16: qty 1

## 2018-08-16 MED ORDER — INSULIN ASPART 100 UNIT/ML ~~LOC~~ SOLN: 0.0000 [IU] | Freq: Every day | SUBCUTANEOUS | Status: DC

## 2018-08-16 MED ORDER — SODIUM CHLORIDE 0.9 % IV BOLUS
500.0000 mL | Freq: Once | INTRAVENOUS | Status: AC
  Administered 2018-08-16: 500 mL via INTRAVENOUS

## 2018-08-16 MED ORDER — ASPIRIN EC 325 MG PO TBEC
325.0000 mg | DELAYED_RELEASE_TABLET | Freq: Every day | ORAL | Status: DC
  Administered 2018-08-17: 325 mg via ORAL
  Filled 2018-08-16: qty 1

## 2018-08-16 MED ORDER — ACETAMINOPHEN 325 MG PO TABS
650.0000 mg | ORAL_TABLET | Freq: Four times a day (QID) | ORAL | Status: DC | PRN
  Administered 2018-08-17 – 2018-08-19 (×6): 650 mg via ORAL
  Filled 2018-08-16 (×6): qty 2

## 2018-08-16 MED ORDER — DOCUSATE SODIUM 100 MG PO CAPS
100.0000 mg | ORAL_CAPSULE | Freq: Two times a day (BID) | ORAL | Status: DC
  Administered 2018-08-16 – 2018-08-19 (×3): 100 mg via ORAL
  Filled 2018-08-16 (×6): qty 1

## 2018-08-16 MED ORDER — SODIUM CHLORIDE 0.9 % IV SOLN
2.0000 g | Freq: Once | INTRAVENOUS | Status: AC
  Administered 2018-08-16: 2 g via INTRAVENOUS
  Filled 2018-08-16: qty 2

## 2018-08-16 MED ORDER — TRAMADOL HCL 50 MG PO TABS
50.0000 mg | ORAL_TABLET | Freq: Four times a day (QID) | ORAL | Status: DC | PRN
  Administered 2018-08-17 – 2018-08-19 (×2): 50 mg via ORAL
  Filled 2018-08-16 (×4): qty 1

## 2018-08-16 MED ORDER — ONDANSETRON HCL 4 MG/2ML IJ SOLN
4.0000 mg | Freq: Four times a day (QID) | INTRAMUSCULAR | Status: DC | PRN
  Administered 2018-08-17: 4 mg via INTRAVENOUS
  Filled 2018-08-16: qty 2

## 2018-08-16 MED ORDER — SODIUM CHLORIDE 0.9 % IV SOLN
2.0000 g | Freq: Three times a day (TID) | INTRAVENOUS | Status: DC
  Administered 2018-08-16: 2 g via INTRAVENOUS
  Filled 2018-08-16 (×3): qty 2

## 2018-08-16 MED ORDER — VANCOMYCIN HCL IN DEXTROSE 1-5 GM/200ML-% IV SOLN
1000.0000 mg | Freq: Once | INTRAVENOUS | Status: AC
  Administered 2018-08-16: 1000 mg via INTRAVENOUS
  Filled 2018-08-16: qty 200

## 2018-08-16 MED ORDER — VANCOMYCIN HCL 10 G IV SOLR
1250.0000 mg | Freq: Three times a day (TID) | INTRAVENOUS | Status: DC
  Administered 2018-08-16: 1250 mg via INTRAVENOUS
  Filled 2018-08-16 (×2): qty 1250

## 2018-08-16 MED ORDER — INSULIN ASPART 100 UNIT/ML ~~LOC~~ SOLN
0.0000 [IU] | Freq: Three times a day (TID) | SUBCUTANEOUS | Status: DC
  Administered 2018-08-17: 2 [IU] via SUBCUTANEOUS
  Administered 2018-08-17 – 2018-08-19 (×5): 1 [IU] via SUBCUTANEOUS
  Filled 2018-08-16 (×6): qty 1

## 2018-08-16 MED ORDER — BENAZEPRIL HCL 20 MG PO TABS
20.0000 mg | ORAL_TABLET | Freq: Every day | ORAL | Status: DC
  Filled 2018-08-16 (×2): qty 1

## 2018-08-16 MED ORDER — DILTIAZEM HCL 25 MG/5ML IV SOLN
5.0000 mg | Freq: Four times a day (QID) | INTRAVENOUS | Status: DC | PRN
  Administered 2018-08-17: 5 mg via INTRAVENOUS
  Filled 2018-08-16 (×4): qty 5

## 2018-08-16 MED ORDER — ATORVASTATIN CALCIUM 10 MG PO TABS
10.0000 mg | ORAL_TABLET | Freq: Every day | ORAL | Status: DC
  Administered 2018-08-17 – 2018-08-19 (×3): 10 mg via ORAL
  Filled 2018-08-16 (×3): qty 1

## 2018-08-16 MED ORDER — RIVAROXABAN 20 MG PO TABS
20.0000 mg | ORAL_TABLET | Freq: Every day | ORAL | Status: DC
  Administered 2018-08-17 – 2018-08-18 (×2): 20 mg via ORAL
  Filled 2018-08-16 (×2): qty 1

## 2018-08-16 MED ORDER — METOPROLOL TARTRATE 50 MG PO TABS
50.0000 mg | ORAL_TABLET | Freq: Two times a day (BID) | ORAL | Status: DC
  Filled 2018-08-16: qty 1

## 2018-08-16 NOTE — Progress Notes (Signed)
.  Family Meeting Note  Advance Directive:yes  Today a meeting took place with the Patient, fiancee at bed side    The following clinical team members were present during this meeting:MD  The following were discussed:Patient's diagnosis: Sepsis, left lower extremity cellulitis, chronic left leg DVT, atrial fibrillation with RVR other comorbidities as documented below, treatment plan of care discussed in detail with the patient  Atrial fibrillation (HCC)    . Chronic low back pain   . Chronic systolic heart failure (HCC)    with EF of 40-45% by echocardiogram, liekly tachycardia induced cariomyopathy  . Diabetes mellitus without complication (HCC)   . DVT of lower limb, acute (HCC) 2014   off anti-coagulation  . Erectile dysfunction   . HLD (hyperlipidemia)   . Hypertension   . Hypogonadism in male        patient's progosis: Unable to determine and Goals for treatment: Full code, fianc Arline Asp is a healthcare POA  Additional follow-up to be provided: Hospitalist, cardiology  Time spent during discussion:17 min  Ramonita Lab, MD

## 2018-08-16 NOTE — Progress Notes (Signed)
Pt's blood pressure 75/36. Verified by checking manual blood pressure and bilateral arms. [facility] notified. Per orders, hold metoprolol and digoxin for now. 500 cc bolus in progress. Per orders, will start 62mL/hr for maintenance fluids. Pt denies dizziness, just states feeling sleepy. Will continue to monitor BP's closely.

## 2018-08-16 NOTE — ED Triage Notes (Addendum)
Per pt he was working out in the yard, had chest tightness in the left of his chest that did not radiate.  + diaphoresis, nausea, dry heaves.  Called EMS.  Per EMS, pt c/o chest pain.  A fib on monitor.  Pt to have stint placed tomorrow.  Given metoprolol 5mg  IV and zofran 4mg  IV.

## 2018-08-16 NOTE — H&P (Signed)
[facility] [place] NAME: Jeff [name]    MR#:  [id number]  DATE OF BIRTH:  15-Jun-1951  DATE OF ADMISSION:  08/16/2018  PRIMARY CARE PHYSICIAN: Guadalupe Maple, MD   REQUESTING/REFERRING PHYSICIAN:  Schuyler Amor, MD  CHIEF COMPLAINT:   Chest pain HISTORY OF PRESENT ILLNESS:  Jeff [name]  is a 67 y.o. 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  Denies any abdominal pain.  Left leg is erythematous, swollen and painful hospitalist team is called for left lower extremity cellulitis and atrial for ablation with RVR.  Patient denies any chest discomfort or shortness of breath or palpitation during my examination.  PAST MEDICAL HISTORY:   Past Medical History:  Diagnosis Date  . Atrial fibrillation ([place])   . Chronic low back pain   . Chronic systolic heart failure (HCC)    with EF of 40-45% by echocardiogram, liekly tachycardia induced cariomyopathy  . Diabetes mellitus without complication ([place])   . DVT of lower limb, acute ([place]) 2014   off anti-coagulation  . Erectile dysfunction   . HLD (hyperlipidemia)   . Hypertension   . Hypogonadism in male     PAST SURGICAL HISTOIRY:   Past Surgical History:  Procedure Laterality Date  . COLONOSCOPY WITH PROPOFOL N/A 05/28/2016   Procedure: COLONOSCOPY WITH PROPOFOL;  Surgeon: Lucilla Lame, MD;  Location: [facility];  Service: Endoscopy;  Laterality: N/A;  diabetic - oral meds  . DVT, leg Left   . INSERTION OF VENA CAVA FILTER  2014  . PERIPHERAL VASCULAR THROMBECTOMY Left 12/01/2017   Procedure: PERIPHERAL VASCULAR THROMBECTOMY;  Surgeon: Katha Cabal, MD;  Location: [facility];  Service: Cardiovascular;  Laterality: Left;    SOCIAL HISTORY:   Social History   Tobacco Use  . Smoking status: Never Smoker  . Smokeless tobacco: Never Used  Substance Use Topics  . Alcohol use: No    Alcohol/week: 0.0 standard drinks    FAMILY HISTORY:   Family History  Problem Relation Age of Onset  . Lymphoma Mother   . Heart failure Father   . Diabetes Father   . Diabetes Mellitus II Unknown   . Heart attack Neg Hx   . Hypertension Neg Hx   . Cancer Neg Hx   . COPD Neg Hx   . Stroke Neg Hx   . Prostate cancer Neg Hx   . Kidney cancer Neg Hx   . Bladder Cancer Neg Hx     DRUG ALLERGIES:  No Known Allergies  REVIEW OF SYSTEMS:  CONSTITUTIONAL: No fever, fatigue or weakness.  EYES: No blurred or double vision.  EARS, NOSE, AND THROAT: No tinnitus or ear pain.  RESPIRATORY: No cough, shortness of breath, wheezing or hemoptysis.  CARDIOVASCULAR: No chest pain, orthopnea, edema.  GASTROINTESTINAL: No nausea, vomiting, diarrhea or abdominal pain.  GENITOURINARY: No dysuria, hematuria.  ENDOCRINE: No polyuria, nocturia,  HEMATOLOGY: No anemia, easy bruising or bleeding SKIN: Left leg is swollen, erythematous MUSCULOSKELETAL: No joint pain or arthritis.   NEUROLOGIC: No tingling, numbness, weakness.  PSYCHIATRY: No anxiety or depression.   MEDICATIONS AT  HOME:   Prior to Admission medications   Medication Sig Start Date End Date Taking? Authorizing Provider  amLODipine (NORVASC) 10 MG tablet Take 1 tablet (10 mg total) by mouth daily. 03/02/18   Guadalupe Maple, MD  aspirin EC 325 MG tablet Take by mouth. 01/03/18 01/03/19  [provider]  atorvastatin (LIPITOR) 10 MG tablet Take 1 tablet (10 mg total) by mouth daily. 03/02/18   Guadalupe Maple, MD  benazepril (LOTENSIN) 20 MG tablet Take 1 tablet (20 mg total) by mouth daily. 05/25/18    Guadalupe Maple, MD  digoxin (LANOXIN) 0.25 MG tablet Take 1 tablet (0.25 mg total) by mouth daily. 12/20/17   Minna Merritts, MD  fluticasone (FLONASE) 50 MCG/ACT nasal spray Place 2 sprays into both nostrils daily. 02/15/18   Guadalupe Maple, MD  glucose blood (CONTOUR NEXT TEST) test strip Use as instructed 12/12/17   Guadalupe Maple, MD  metFORMIN (GLUCOPHAGE) 1000 MG tablet Take 1 tablet (1,000 mg total) by mouth 2 (two) times daily with a meal. 03/02/18   Crissman, Jeannette How, MD  metoprolol tartrate (LOPRESSOR) 50 MG tablet Take 1 tablet (50 mg total) by mouth 2 (two) times daily. 03/02/18   Guadalupe Maple, MD  rivaroxaban (XARELTO) 20 MG TABS tablet Take 1 tablet (20 mg total) by mouth daily with supper. 12/14/17   Minna Merritts, MD      VITAL SIGNS:  Blood pressure 132/74, pulse (!) 128, temperature 99.3 F (37.4 C), temperature source Oral, resp. rate 16, height 5' 10"  (1.778 m), weight 117.9 kg, SpO2 95 %.  PHYSICAL EXAMINATION:  GENERAL:  67 y.o.-year-old patient lying in the bed with no acute distress.  EYES: Pupils equal, round, reactive to light and accommodation. No scleral icterus. Extraocular muscles intact.  HEENT: Head atraumatic, normocephalic. Oropharynx and nasopharynx clear.  NECK:  Supple, no jugular venous distention. No thyroid enlargement, no tenderness.  LUNGS: Normal breath sounds bilaterally, no wheezing, rales,rhonchi or crepitation. No use of accessory muscles of respiration.  CARDIOVASCULAR: Irregularly irregular, atrial ablation with RVR no murmurs, rubs, or gallops.  ABDOMEN: Soft, nontender, nondistended. Bowel sounds present.  EXTREMITIES: Left lower extremity is erythematous and edematous with feeble pulses no pedal edema, cyanosis, or clubbing.  NEUROLOGIC: Cranial nerves II through XII are intact. Muscle strength 5/5 in all extremities. Sensation intact. Gait not checked.  PSYCHIATRIC: The patient is alert and oriented x 3.  SKIN: No obvious rash,  lesion, or ulcer.   LABORATORY PANEL:   CBC Recent Labs  Lab 08/16/18 1559  WBC 11.4*  HGB 15.8  HCT 47.5  PLT 170   ------------------------------------------------------------------------------------------------------------------  Chemistries  Recent Labs  Lab 08/16/18 1559  NA 138  K 4.8  CL 101  CO2 23  GLUCOSE 169*  BUN 31*  CREATININE 1.47*  CALCIUM 9.2  AST 24  ALT 18  ALKPHOS 54  BILITOT 1.7*   ------------------------------------------------------------------------------------------------------------------  Cardiac Enzymes Recent Labs  Lab 08/16/18 1559  TROPONINI <0.03   ------------------------------------------------------------------------------------------------------------------  RADIOLOGY:  Dg Chest Port 1 View  Result Date: 08/16/2018 CLINICAL DATA:  Per pt he was working out in the yard, had chest tightness in the left of his chest that did not radiate. + diaphoresis, nausea, dry heaves. EXAM: PORTABLE CHEST 1 VIEW COMPARISON:  11/20/2017 FINDINGS: Lung volumes are low. There is elevation right hemidiaphragm. There is opacity noted at the right lung base that is likely atelectasis. Lungs are otherwise clear. No convincing pleural effusion.  No pneumothorax. Cardiac silhouette is normal in size. No mediastinal or hilar masses or convincing adenopathy. Skeletal structures are grossly intact. IMPRESSION: No acute cardiopulmonary disease. Electronically Signed   By: Lajean Manes M.D.   On: 08/16/2018 16:23    EKG:   Orders placed or performed during the hospital encounter of 08/16/18  . EKG 12-Lead  . EKG 12-Lead  . ED EKG 12-Lead  . ED EKG 12-Lead    IMPRESSION AND PLAN:    #Sepsis secondary to left lower extremity cellulitis Admit to MedSurg unit Patient met septic criteria at the time of admission with the fever and tachycardia Blood cultures, urine cultures Broad-spectrum IV antibiotics cefepime and vancomycin  #Left lower extremity  cellulitis with chronic history of DVT Admit to MedSurg unit IVC filter was removed by [facility] radiologist IV cefepime Pain management as needed Left lower extremity venous Dopplers  #Atrial for ablation with RVR  IV Cardizem Continue digoxin Continue Xarelto and aspirin his home medications Cardiology consult Gentle hydration with IV fluids as the patient has systolic CHF not fluid overloaded  #Non-insulin-requiring diabetes mellitus Hold metformin continue sliding scale insulin  #Dilated cardiomyopathy with chronic systolic CHF Not fluid overloaded at this time Continue home medication aspirin, Lipitor, benazepril, Lopressor   DVT prophylaxis on Xarelto  all the records are reviewed and case discussed with ED provider. Management plans discussed with the patient, family and they are in agreement.  CODE STATUS: fc   TOTAL TIME TAKING CARE OF THIS PATIENT: 43 minutes.   Note: This dictation was prepared with Dragon dictation along with smaller phrase technology. Any transcriptional errors that result from this process are unintentional.  Nicholes Mango M.D on 08/16/2018 at 6:40 PM  Between 7am to 6pm - Pager - [phone]  After 6pm go to [email or web address] - password [email or web address] [facility]  Office  [phone]  CC: Primary care physician; Guadalupe Maple, MD

## 2018-08-16 NOTE — ED Provider Notes (Signed)
[facility] Emergency Department Provider Note  ____________________________________________   I have reviewed the triage vital signs and the nursing notes. Where available I have reviewed prior notes and, if possible and indicated, outside hospital notes.    HISTORY  Chief Complaint Chest Pain    HPI Jeff [name] is a 67 y.o. male   With a history of inferior vena caval stenosis, DVT IVC thrombosis in the past obesity chronic sinusitis, on Xarelto and digitalis, history of atrial fibrillation and CHF with a EF of 40 to 45% by prior notes, diabetes mellitus, states that he was in his normal state of health until this afternoon when he began to feel chills and vomited, just dry heaving, x2.  No abdominal pain.  He has a chronically distended left lower leg which is not different from baseline.  He thinks it usually looks pretty red.  This is where he had his DVT in the past.  He has had no known fever, no cough, no testicular pain or swelling no diarrhea, no abdominal pain no chest pain, he does not feel short of breath, he states that he just felt chills and generally weak and had some dry heaves.  EMS found him to be in A. fib with RVR and elected to give him 5 mg of IV Lopressor according to nursing, they also gave him approximately 130 cc of IV fluid on transport.  At this time states he feels "pretty good".    Past Medical History:  Diagnosis Date  . Atrial fibrillation (HCC)   . Chronic low back pain   . Chronic systolic heart failure (HCC)    with EF of 40-45% by echocardiogram, liekly tachycardia induced cariomyopathy  . Diabetes mellitus without complication (HCC)   . DVT of lower limb, acute (HCC) 2014   off anti-coagulation  . Erectile dysfunction   . HLD (hyperlipidemia)   . Hypertension   . Hypogonadism in male     Patient Active Problem List   Diagnosis Date Noted  . Inferior vena caval stenosis 05/22/2018  . Acute blood loss anemia  01/04/2018  . 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 Obesity (BMI 30-39.9) 01/04/2018  . Chronic sinusitis 12/29/2017  . History of DVT (deep vein thrombosis) 12/17/2017  . Bleeding nose 12/08/2017  . Acute deep vein thrombosis (DVT) of femoral vein of left lower extremity (HCC) 11/28/2017  . Acute renal failure (ARF) (HCC) 11/20/2017  . BPH (benign prostatic hyperplasia) 12/20/2016  . Advanced care planning/counseling discussion   . Erectile dysfunction 03/29/2015  . Hypogonadism in male 03/29/2015  . Hyperlipidemia 02/02/2013  . Non-insulin dependent type 2 diabetes mellitus (HCC) 02/02/2013  . DVT of lower limb, acute (HCC) 11/08/2012  . EDEMA 06/03/2010  . HYPERTENSION, BENIGN 02/23/2010  . Congestive dilated cardiomyopathy (HCC) 02/23/2010  . A-fib (HCC) 02/20/2010    Past Surgical History:  Procedure Laterality Date  . COLONOSCOPY WITH PROPOFOL N/A 05/28/2016   Procedure: COLONOSCOPY WITH PROPOFOL;  Surgeon: Midge Minium, MD;  Location: [facility];  Service: Endoscopy;  Laterality: N/A;  diabetic - oral meds  . DVT, leg Left   . INSERTION OF VENA CAVA FILTER  2014  . PERIPHERAL VASCULAR THROMBECTOMY Left 12/01/2017   Procedure: PERIPHERAL VASCULAR THROMBECTOMY;  Surgeon: Renford Dills, MD;  Location: [facility];  Service: Cardiovascular;  Laterality: Left;    Prior to Admission medications   Medication Sig Start Date End Date Taking? Authorizing Provider  amLODipine (NORVASC) 10 MG tablet  Take 1 tablet (10 mg total) by mouth daily. 03/02/18   Steele Sizer, MD  aspirin EC 325 MG tablet Take by mouth. 01/03/18 01/03/19  [provider]  atorvastatin (LIPITOR) 10 MG tablet Take 1 tablet (10 mg total) by mouth daily. 03/02/18   Steele Sizer, MD  benazepril (LOTENSIN) 20 MG tablet Take 1 tablet (20 mg total) by mouth daily. 05/25/18   Steele Sizer, MD  digoxin (LANOXIN) 0.25 MG tablet Take 1 tablet (0.25 mg total) by mouth daily. 12/20/17    Antonieta Iba, MD  fluticasone (FLONASE) 50 MCG/ACT nasal spray Place 2 sprays into both nostrils daily. 02/15/18   Steele Sizer, MD  glucose blood (CONTOUR NEXT TEST) test strip Use as instructed 12/12/17   Steele Sizer, MD  metFORMIN (GLUCOPHAGE) 1000 MG tablet Take 1 tablet (1,000 mg total) by mouth 2 (two) times daily with a meal. 03/02/18   Crissman, Redge Gainer, MD  metoprolol tartrate (LOPRESSOR) 50 MG tablet Take 1 tablet (50 mg total) by mouth 2 (two) times daily. 03/02/18   Steele Sizer, MD  rivaroxaban (XARELTO) 20 MG TABS tablet Take 1 tablet (20 mg total) by mouth daily with supper. 12/14/17   Antonieta Iba, MD    Allergies Patient has no known allergies.  Family History  Problem Relation Age of Onset  . Lymphoma Mother   . Heart failure Father   . Diabetes Father   . Diabetes Mellitus II Unknown   . Heart attack Neg Hx   . Hypertension Neg Hx   . Cancer Neg Hx   . COPD Neg Hx   . Stroke Neg Hx   . Prostate cancer Neg Hx   . Kidney cancer Neg Hx   . Bladder Cancer Neg Hx     Social History Social History   Tobacco Use  . Smoking status: Never Smoker  . Smokeless tobacco: Never Used  Substance Use Topics  . Alcohol use: No    Alcohol/week: 0.0 standard drinks  . Drug use: No    Review of Systems Constitutional: No fever/chills Eyes: No visual changes. ENT: No sore throat. No stiff neck no neck pain Cardiovascular: Denies chest pain. Respiratory: Denies shortness of breath. Gastrointestinal:   +vomiting.  No diarrhea.  No constipation. Genitourinary: Negative for dysuria. Musculoskeletal: Negative lower extremity swelling Skin: Negative for rash. Neurological: Negative for severe headaches, focal weakness or numbness.   ____________________________________________   PHYSICAL EXAM:  VITAL SIGNS: ED Triage Vitals  Enc Vitals Group     BP 08/16/18 1538 (!) 188/125     Pulse Rate 08/16/18 1538 (!) 156     Resp 08/16/18 1538 20     Temp  08/16/18 1538 (!) 100.8 F (38.2 C)     Temp Source 08/16/18 1538 Oral     SpO2 08/16/18 1538 95 %     Weight 08/16/18 1540 260 lb (117.9 kg)     Height 08/16/18 1540 5\' 10"  (1.778 m)     Head Circumference --      Peak Flow --      Pain Score 08/16/18 1539 5     Pain Loc --      Pain Edu? --      Excl. in GC? --     Constitutional: Alert and oriented. Well appearing and in no acute distress. Eyes: Conjunctivae are normal Head: Atraumatic HEENT: No congestion/rhinnorhea. Mucous membranes are moist.  Oropharynx non-erythematous Neck:   Nontender with no  meningismus, no masses, no stridor Cardiovascular: Tachycardia, irregularly irregular. Grossly normal heart sounds.  Good peripheral circulation. Respiratory: Normal respiratory effort.  No retractions. Lungs CTAB. Abdominal: Soft and nontender. No distention. No guarding no rebound GU: No evidence of infection noted Back:  There is no focal tenderness or step off.  there is no midline tenderness there are no lesions noted. there is no CVA tenderness Musculoskeletal: No lower extremity tenderness, no upper extremity tenderness.  Distal pulses bilaterally, patient does have erythema pretibially to the left leg which is warm to touch, there are some breaks in the scans which patient states her "blisters from my swelling", there is no streaks from the area, there is no crepitus Hartman's are soft, there is left greater than right mild edema. Neurologic:  Normal speech and language. No gross focal neurologic deficits are appreciated.  Skin:  Skin is warm to touch generally more warm on the left anterior pretibial region. Psychiatric: Mood and affect are normal. Speech and behavior are normal.  ____________________________________________   LABS (all labs ordered are listed, but only abnormal results are displayed)  Labs Reviewed  CULTURE, BLOOD (ROUTINE X 2)  CULTURE, BLOOD (ROUTINE X 2)  LACTIC ACID, PLASMA  LACTIC ACID, PLASMA   COMPREHENSIVE METABOLIC PANEL  CBC WITH DIFFERENTIAL/PLATELET  URINALYSIS, ROUTINE W REFLEX MICROSCOPIC  INFLUENZA PANEL BY PCR (TYPE A & B)  PROTIME-INR  DIGOXIN LEVEL  TROPONIN I    Pertinent labs  results that were available during my care of the patient were reviewed by me and considered in my medical decision making (see chart for details). ____________________________________________  EKG  I personally interpreted any EKGs ordered by me or triage  Atrial fibrillation rate 127 no acute ST elevation or depression normal axis nonspecific ST changes ________________________________________  RADIOLOGY  Pertinent labs & imaging results that were available during my care of the patient were reviewed by me and considered in my medical decision making (see chart for details). If possible, patient and/or family made aware of any abnormal findings.  No results found. ____________________________________________    PROCEDURES  Procedure(s) performed: None  Procedures  Critical Care performed: None  ____________________________________________   INITIAL IMPRESSION / ASSESSMENT AND PLAN / ED COURSE  Pertinent labs & imaging results that were available during my care of the patient were reviewed by me and considered in my medical decision making (see chart for details).  Patient here with fever and A. fib with RVR, his picture is somewhat complicated  by EMS administration of Lopressor.  His heart rate is likely reactive to the fever and the vomiting, we are giving him IV fluids somewhat gingerly given his history of CHF on last echo.  Patient has a history of digitalis and Xarelto, no evidence of GI bleeding at this time by history, he does not appear to be anemic clinically, however we will check a CBC, source of the fever I think is the most important thing for [place] to look for at this time, will initiate sepsis evaluation although I am not sure he is actually septic.  He does  have fever but his heart rate is up because of his A. fib which is likely reactive.  We will check a digitalis level, we will obtain blood and urine cultures urinalysis chest x-ray, patient does appear to my exam to have a pretibial cellulitis which certainly can cause his symptoms, and perhaps will need antibiotics for that however we will also check influenza in the for viral sources.  ____________________________________________   FINAL CLINICAL IMPRESSION(S) / ED DIAGNOSES  Final diagnoses:  None      This chart was dictated using voice recognition software.  Despite best efforts to proofread,  errors can occur which can change meaning.      Jeanmarie Plant, MD 08/16/18 [phone]

## 2018-08-16 NOTE — Consult Note (Signed)
Pharmacy Antibiotic Note  Jeff [name] is a 67 y.o. male admitted on 08/16/2018 with sepsis. He is being admitted for afib with RVR and fever meeting sepsis criteria. Additionally he has a left lower leg cellulitis. Pharmacy has been consulted for vancomycin and cefepime dosing. PMH includes inferior vena caval stenosis, DVT IVC thrombosis in the past obesity, chronic sinusitis, on Xarelto and digitalis, history of atrial fibrillation and CHF with a EF of 40 to 45%, diabetes mellitus. His last admission here was in January 2019 for a DVT.   Plan: 1) Vancomycin 1250 IV every 8 hours beginning 6 hours after 1st 1000mg  dose. Goal trough 15-20 mcg/mL. K 0.072, Vd 83L, T1/2: 9.6h Calculated concentrations at steady state: 27.2/16.6 mcg/mL, Vt prior to the 4th dose  2) Cefepime 2 grams IV every 8 hours  Height: 5\' 10"  (177.8 cm) Weight: 260 lb (117.9 kg) IBW/kg (Calculated) : 73  Temp (24hrs), Avg:100.8 F (38.2 C), Min:100.8 F (38.2 C), Max:100.8 F (38.2 C)  No Known Allergies  Antimicrobials this admission: cefepime 10/9 >>  vancomycin 10/9 >>   Microbiology results: 10/9 BCx: pending 10/9 UCx: pending   Thank you for allowing pharmacy to be a part of this patient's care.  Lowella Bandy, PharmD 08/16/2018 4:16 PM

## 2018-08-16 NOTE — Progress Notes (Signed)
CODE SEPSIS - PHARMACY COMMUNICATION  **Broad Spectrum Antibiotics should be administered within 1 hour of Sepsis diagnosis**  Time Code Sepsis Called/Page Received: 1608  Antibiotics Ordered: cefepime and vancomycin  Time of 1st antibiotic administration: 1619  Additional action taken by pharmacy: none required  If necessary, Name of Provider/Nurse Contacted: N/A    Lowella Bandy ,PharmD Clinical Pharmacist  08/16/2018  4:23 PM

## 2018-08-17 ENCOUNTER — Inpatient Hospital Stay: Payer: Medicare HMO

## 2018-08-17 ENCOUNTER — Encounter: Payer: Self-pay | Admitting: Physician Assistant

## 2018-08-17 ENCOUNTER — Inpatient Hospital Stay (HOSPITAL_COMMUNITY)
Admit: 2018-08-17 | Discharge: 2018-08-17 | Disposition: A | Discharge status: Home / Self Care | Payer: Medicare HMO | Attending: Physician Assistant | Admitting: Physician Assistant

## 2018-08-17 DIAGNOSIS — I482 Chronic atrial fibrillation, unspecified: Secondary | ICD-10-CM

## 2018-08-17 DIAGNOSIS — I517 Cardiomegaly: Secondary | ICD-10-CM

## 2018-08-17 DIAGNOSIS — I509 Heart failure, unspecified: Secondary | ICD-10-CM

## 2018-08-17 DIAGNOSIS — I4891 Unspecified atrial fibrillation: Secondary | ICD-10-CM

## 2018-08-17 LAB — COMPREHENSIVE METABOLIC PANEL
ALK PHOS: 48 U/L (ref 38–126)
ALT: 14 U/L (ref 0–44)
AST: 19 U/L (ref 15–41)
Albumin: 3.6 g/dL (ref 3.5–5.0)
Anion gap: 10 (ref 5–15)
BUN: 34 mg/dL — AB (ref 8–23)
CALCIUM: 8 mg/dL — AB (ref 8.9–10.3)
CHLORIDE: 109 mmol/L (ref 98–111)
CO2: 21 mmol/L — AB (ref 22–32)
CREATININE: 1.54 mg/dL — AB (ref 0.61–1.24)
GFR, EST AFRICAN AMERICAN: 52 mL/min — AB (ref >60)
GFR, EST NON AFRICAN AMERICAN: 45 mL/min — AB (ref >60)
Glucose, Bld: 203 mg/dL — ABNORMAL HIGH (ref 70–99)
Potassium: 4.3 mmol/L (ref 3.5–5.1)
Sodium: 140 mmol/L (ref 135–145)
Total Bilirubin: 1.5 mg/dL — ABNORMAL HIGH (ref 0.3–1.2)
Total Protein: 6.4 g/dL — ABNORMAL LOW (ref 6.5–8.1)

## 2018-08-17 LAB — TSH: TSH: 1.387 u[IU]/mL (ref 0.350–4.500)

## 2018-08-17 LAB — CBC
HEMATOCRIT: 47 % (ref 39.0–52.0)
Hemoglobin: 15.4 g/dL (ref 13.0–17.0)
MCH: 28.9 pg (ref 26.0–34.0)
MCHC: 32.8 g/dL (ref 30.0–36.0)
MCV: 88.3 fL (ref 80.0–100.0)
NRBC: 0 % (ref 0.0–0.2)
PLATELETS: 164 10*3/uL (ref 150–400)
RBC: 5.32 MIL/uL (ref 4.22–5.81)
RDW: 13.2 % (ref 11.5–15.5)
WBC: 15.5 10*3/uL — AB (ref 4.0–10.5)

## 2018-08-17 LAB — ECHOCARDIOGRAM COMPLETE
Height: 71 in
Weight: 4435.2 oz

## 2018-08-17 LAB — GLUCOSE, CAPILLARY
GLUCOSE-CAPILLARY: 148 mg/dL — AB (ref 70–99)
Glucose-Capillary: 178 mg/dL — ABNORMAL HIGH (ref 70–99)
Glucose-Capillary: 150 mg/dL — ABNORMAL HIGH (ref 70–99)
Glucose-Capillary: 130 mg/dL — ABNORMAL HIGH (ref 70–99)

## 2018-08-17 LAB — BLOOD CULTURE ID PANEL (REFLEXED)
ACINETOBACTER BAUMANNII: NOT DETECTED
CANDIDA ALBICANS: NOT DETECTED
Candida glabrata: NOT DETECTED
Candida krusei: NOT DETECTED
Candida parapsilosis: NOT DETECTED
Candida tropicalis: NOT DETECTED
Enterobacter cloacae complex: NOT DETECTED
Enterobacteriaceae species: NOT DETECTED
Enterococcus species: NOT DETECTED
Escherichia coli: NOT DETECTED
HAEMOPHILUS INFLUENZAE: NOT DETECTED
KLEBSIELLA PNEUMONIAE: NOT DETECTED
Klebsiella oxytoca: NOT DETECTED
LISTERIA MONOCYTOGENES: NOT DETECTED
NEISSERIA MENINGITIDIS: NOT DETECTED
PROTEUS SPECIES: NOT DETECTED
Pseudomonas aeruginosa: NOT DETECTED
STAPHYLOCOCCUS AUREUS BCID: NOT DETECTED
STREPTOCOCCUS AGALACTIAE: NOT DETECTED
STREPTOCOCCUS PYOGENES: NOT DETECTED
STREPTOCOCCUS SPECIES: DETECTED — AB
Serratia marcescens: NOT DETECTED
Staphylococcus species: NOT DETECTED
Streptococcus pneumoniae: NOT DETECTED

## 2018-08-17 LAB — MAGNESIUM: Magnesium: 1.5 mg/dL — ABNORMAL LOW (ref 1.7–2.4)

## 2018-08-17 MED ORDER — PERFLUTREN LIPID MICROSPHERE
1.0000 mL | INTRAVENOUS | Status: AC | PRN
  Administered 2018-08-17: 3 mL via INTRAVENOUS
  Filled 2018-08-17: qty 10

## 2018-08-17 MED ORDER — SODIUM CHLORIDE 0.9 % IV SOLN
2.0000 g | INTRAVENOUS | Status: DC
  Administered 2018-08-17 – 2018-08-19 (×3): 2 g via INTRAVENOUS
  Filled 2018-08-17 (×3): qty 2

## 2018-08-17 MED ORDER — METOPROLOL TARTRATE 25 MG PO TABS
25.0000 mg | ORAL_TABLET | Freq: Four times a day (QID) | ORAL | Status: DC
  Administered 2018-08-17 – 2018-08-19 (×9): 25 mg via ORAL
  Filled 2018-08-17 (×9): qty 1

## 2018-08-17 MED ORDER — DIGOXIN 125 MCG PO TABS: 0.1250 mg | ORAL_TABLET | Freq: Every day | ORAL | Status: DC

## 2018-08-17 NOTE — Plan of Care (Signed)

## 2018-08-17 NOTE — Progress Notes (Signed)
*  PRELIMINARY RESULTS* Echocardiogram 2D Echocardiogram has been performed. Definity image enhancer was administered.  Jeff [name] Jeff [name] 08/17/2018, 11:05 AM

## 2018-08-17 NOTE — Consult Note (Signed)
a    Cardiology Consultation:   Patient ID: Jeff [name]; [id number]; 03/19/1951   Admit date: 08/16/2018 Date of Consult: 08/17/2018  Primary Care Provider: Steele Sizer, MD Primary Cardiologist: Mariah Milling   Patient Profile:   Jeff [name] is a 67 y.o. male with a hx of permanent Afib on Xarelto, HFrEF of uncertain etiology, chronic DVT s/p prior IVC filter s/p extraction s/p thrombectomy at [facility], syncope felt to be secondary to dehydration, CKD stage II, DM2, HTN, and HLD who is being seen today for the evaluation of permanent Afib with RVR at the request of Dr. Amado Coe.  History of Present Illness:   Jeff [name] was seen in 2011 with Afib with RVR. At that time, he ultimately underwent DCCV, though redeveloped Afib in 2016 and has been in Afib since as he declined repeat rhythm control strategy. Notes indicate an echo from 2011 showing an EF of 40-45%. No prior known ischemic evaluation in Epic. He has been managed on Xarelto, digoxin, and metoprolol for his Afib.   Patient had his IVC filter removed in 12/2017. Since then, he has had intermittent swelling and weeping of the left lower extremity. Over the past 4-5 weeks, this swelling and weeping has gotten worse with associated warmth and erythema. On 10/9, the patient developed fever with a T max at home of 102 with associated chills and myalgias prompting him to come in for evaluation. Never with chest pain, SOB, palpitations, nausea, vomiting, dizziness, presyncope, or syncope.   EKG showed Afib with RVR, 127 bpm, no acute st/t changes, CXR showed no acute process. Labs showed influenza negative, troponin negative x 1, WBC 11.4-->15.5, HGB 15.8, PLT 170, digoxin < 0.2, SCr 1.47-->1.54, K+ 4.8-->4.3, T bili 1.7-->1.5, glucose 203, blood culture positive for streptococcus. T max 101.9. BP has been labile ranging from the 70s to 180s systolic. He has remained in Afib with RVR with ventricular rates ranging from the 120s to 170s bpm.  Weight somewhere between 117-125 kg. Since admission, he has been receiving IV fluids, cefepime, ceftriaxone, and vancomycin. He has received 5 mg of IV diltiazem x 1 since being admitted. He remains in Afib with RVR with ventricular rates in the 130s bpm with a systolic BP of 143 at time of cardiology consult.   Past Medical History:  Diagnosis Date  . Chronic deep vein thrombosis (DVT) (HCC)   . Chronic low back pain   . Chronic systolic heart failure (HCC)    a. TTE 2011 with EF 40-45% per notes  . Diabetes mellitus without complication (HCC)   . Erectile dysfunction   . HLD (hyperlipidemia)   . Hypertension   . Hypogonadism in male   . Permanent atrial fibrillation    a. s/p DCCV 2011; b. redeveloped Afib in 2016; c. CHADS2VASc at least 5 (CHF, HTN, age x 1, DM, vascular disease); d. on Xarelto    Past Surgical History:  Procedure Laterality Date  . COLONOSCOPY WITH PROPOFOL N/A 05/28/2016   Procedure: COLONOSCOPY WITH PROPOFOL;  Surgeon: Midge Minium, MD;  Location: [facility];  Service: Endoscopy;  Laterality: N/A;  diabetic - oral meds  . DVT, leg Left   . INSERTION OF VENA CAVA FILTER  2014  . PERIPHERAL VASCULAR THROMBECTOMY Left 12/01/2017   Procedure: PERIPHERAL VASCULAR THROMBECTOMY;  Surgeon: Renford Dills, MD;  Location: [facility] INVASIVE CV LAB;  Service: Cardiovascular;  Laterality: Left;     Home Meds: Prior to Admission medications   Medication Sig Start  Date End Date Taking? Authorizing Provider  amLODipine (NORVASC) 10 MG tablet Take 1 tablet (10 mg total) by mouth daily. 03/02/18   Steele Sizer, MD  aspirin EC 325 MG tablet Take by mouth. 01/03/18 01/03/19  [provider]  atorvastatin (LIPITOR) 10 MG tablet Take 1 tablet (10 mg total) by mouth daily. 03/02/18   Steele Sizer, MD  benazepril (LOTENSIN) 20 MG tablet Take 1 tablet (20 mg total) by mouth daily. 05/25/18   Steele Sizer, MD  digoxin (LANOXIN) 0.25 MG tablet Take 1 tablet (0.25  mg total) by mouth daily. 12/20/17   Antonieta Iba, MD  fluticasone (FLONASE) 50 MCG/ACT nasal spray Place 2 sprays into both nostrils daily. 02/15/18   Steele Sizer, MD  glucose blood (CONTOUR NEXT TEST) test strip Use as instructed 12/12/17   Steele Sizer, MD  metFORMIN (GLUCOPHAGE) 1000 MG tablet Take 1 tablet (1,000 mg total) by mouth 2 (two) times daily with a meal. 03/02/18   Crissman, Redge Gainer, MD  metoprolol tartrate (LOPRESSOR) 50 MG tablet Take 1 tablet (50 mg total) by mouth 2 (two) times daily. 03/02/18   Steele Sizer, MD  rivaroxaban (XARELTO) 20 MG TABS tablet Take 1 tablet (20 mg total) by mouth daily with supper. 12/14/17   Antonieta Iba, MD    Inpatient Medications: Scheduled Meds: . amLODipine  10 mg Oral Daily  . aspirin EC  325 mg Oral Daily  . atorvastatin  10 mg Oral Daily  . benazepril  20 mg Oral Daily  . digoxin  0.25 mg Oral Daily  . docusate sodium  100 mg Oral BID  . fluticasone  2 spray Each Nare Daily  . insulin aspart  0-5 Units Subcutaneous QHS  . insulin aspart  0-9 Units Subcutaneous TID WC  . metoprolol tartrate  50 mg Oral BID  . rivaroxaban  20 mg Oral Q supper   Continuous Infusions: . sodium chloride Stopped (08/17/18 0431)  . cefTRIAXone (ROCEPHIN)  IV 2 g (08/17/18 0431)   PRN Meds: acetaminophen **OR** acetaminophen, diltiazem, ondansetron **OR** ondansetron (ZOFRAN) IV, traMADol  Allergies:  No Known Allergies  Social History:   Social History   Socioeconomic History  . Marital status: Significant Other    Spouse name: Not on file  . Number of children: Not on file  . Years of education: Not on file  . Highest education level: Not on file  Occupational History  . Not on file  Social Needs  . Financial resource strain: Not hard at all  . Food insecurity:    Worry: Never true    Inability: Never true  . Transportation needs:    Medical: No    Non-medical: No  Tobacco Use  . Smoking status: Never Smoker  . Smokeless  tobacco: Never Used  Substance and Sexual Activity  . Alcohol use: No    Alcohol/week: 0.0 standard drinks  . Drug use: No  . Sexual activity: Not on file  Lifestyle  . Physical activity:    Days per week: 3 days    Minutes per session: 150+ min  . Stress: Not at all  Relationships  . Social connections:    Talks on phone: Twice a week    Gets together: More than three times a week    Attends religious service: More than 4 times per year    Active member of club or organization: Yes    Attends meetings of clubs or organizations: More  than 4 times per year    Relationship status: Living with partner  . Intimate partner violence:    Fear of current or ex partner: No    Emotionally abused: No    Physically abused: No    Forced sexual activity: No  Other Topics Concern  . Not on file  Social History Narrative   Former Emergency planning/management officer    Independent at baseline. [employer] club     Family History:   Family History  Problem Relation Age of Onset  . Lymphoma Mother   . Heart failure Father   . Diabetes Father   . Diabetes Mellitus II Unknown   . Heart attack Neg Hx   . Hypertension Neg Hx   . Cancer Neg Hx   . COPD Neg Hx   . Stroke Neg Hx   . Prostate cancer Neg Hx   . Kidney cancer Neg Hx   . Bladder Cancer Neg Hx     ROS:  Review of Systems  Constitutional: Positive for chills, fever and malaise/fatigue. Negative for diaphoresis and weight loss.  HENT: Negative for congestion.   Eyes: Negative for discharge and redness.  Respiratory: Negative for cough, hemoptysis, sputum production, shortness of breath and wheezing.   Cardiovascular: Positive for leg swelling. Negative for chest pain, palpitations, orthopnea, claudication and PND.       Left lower extremity   Gastrointestinal: Negative for abdominal pain, blood in stool, heartburn, melena, nausea and vomiting.  Genitourinary: Negative for hematuria.  Musculoskeletal: Positive for myalgias. Negative for  falls.  Skin: Negative for rash.  Neurological: Positive for weakness. Negative for dizziness, tingling, tremors, sensory change, speech change, focal weakness and loss of consciousness.  Endo/Heme/Allergies: Does not bruise/bleed easily.  Psychiatric/Behavioral: Negative for substance abuse. The patient is not nervous/anxious.   All other systems reviewed and are negative.     Physical Exam/Data:   Vitals:   08/17/18 0005 08/17/18 0152 08/17/18 0434 08/17/18 0747  BP: (!) 82/56 103/67 128/85 (!) 143/85  Pulse: 96 90 (!) 124 (!) 111  Resp:    19  Temp:   98.8 F (37.1 C) 99.9 F (37.7 C)  TempSrc:   Oral Oral  SpO2:   93% 98%  Weight:      Height:        Intake/Output Summary (Last 24 hours) at 08/17/2018 0758 Last data filed at 08/17/2018 0651 Gross per 24 hour  Intake 1001.46 ml  Output 850 ml  Net 151.46 ml   Filed Weights   08/16/18 1540 08/16/18 2032  Weight: 117.9 kg 125.7 kg   Body mass index is 38.66 kg/m.   Physical Exam: General: Well developed, well nourished, in no acute distress. Head: Normocephalic, atraumatic, sclera non-icteric, no xanthomas, nares without discharge.  Neck: Negative for carotid bruits. JVD not elevated. Lungs: Clear bilaterally to auscultation without wheezes, rales, or rhonchi. Breathing is unlabored. Heart: Tachycardic, irregularly irregular with S1 S2. No murmurs, rubs, or gallops appreciated. Abdomen: Soft, non-tender, non-distended with normoactive bowel sounds. No hepatomegaly. No rebound/guarding. No obvious abdominal masses. Msk:  Strength and tone appear normal for age. Extremities: No clubbing or cyanosis. Left lower extremity erythematous, warm, and swollen with several scabs noted. Distal pedal pulses are 2+ and equal bilaterally. Neuro: Alert and oriented X 3. No facial asymmetry. No focal deficit. Moves all extremities spontaneously. Psych:  Responds to questions appropriately with a normal affect.   EKG:  The EKG was  personally reviewed and demonstrates: Afib with RVR,  127 bpm, no acute st/t changes  Telemetry:  Telemetry was personally reviewed and demonstrates: Afib with RVR, 110s to 140s bpm  Weights: Filed Weights   08/16/18 1540 08/16/18 2032  Weight: 117.9 kg 125.7 kg    Relevant CV Studies: Reported echo in 2011 with an EF of 40-45%  Laboratory Data:  Chemistry Recent Labs  Lab 08/16/18 1559 08/17/18 0250  NA 138 140  K 4.8 4.3  CL 101 109  CO2 23 21*  GLUCOSE 169* 203*  BUN 31* 34*  CREATININE 1.47* 1.54*  CALCIUM 9.2 8.0*  GFRNONAA 48* 45*  GFRAA 55* 52*  ANIONGAP 14 10    Recent Labs  Lab 08/16/18 1559 08/17/18 0250  PROT 7.7 6.4*  ALBUMIN 4.7 3.6  AST 24 19  ALT 18 14  ALKPHOS 54 48  BILITOT 1.7* 1.5*   Hematology Recent Labs  Lab 08/16/18 1559 08/17/18 0250  WBC 11.4* 15.5*  RBC 5.39 5.32  HGB 15.8 15.4  HCT 47.5 47.0  MCV 88.1 88.3  MCH 29.3 28.9  MCHC 33.3 32.8  RDW 13.0 13.2  PLT 170 164   Cardiac Enzymes Recent Labs  Lab 08/16/18 1559  TROPONINI <0.03   No results for input(s): TROPIPOC in the last 168 hours.  BNPNo results for input(s): BNP, PROBNP in the last 168 hours.  DDimer No results for input(s): DDIMER in the last 168 hours.  Radiology/Studies:  Dg Chest Port 1 View  Result Date: 08/16/2018 IMPRESSION: No acute cardiopulmonary disease. Electronically Signed   By: Amie Portland M.D.   On: 08/16/2018 16:23    Assessment and Plan:   1. Permanent Afib with RVR: -Tachycardic rates in the setting of his sepsis, lower extremity cellulitis, and bacteremia  -Ventricular rates remain elevated in the 110s to 130s bpm -Stop amlodipine and benazepril in an effort to have BP room for rate control -Stop digoxin given his acute on CKD (would avoid conitnued use moving forward unless absolutely needed without other options) -Change Lopressor to 25 mg q 6 hours for added rate control -As his infection improves, his rates should as  well -CHADS2VASc at least 5 (CHF, HTN, age x 1, DM, vascular disease) -Continue Xarelto 20 mg q dinner given CrCl 82 mL/min  -Potassium at goal -Check magnesium and TSH  2. Sepsis secondary to cellulitis of the left lower extremity and streptococcus bacteremia: -Driving #1 -ABX per IM  3. HFrEF: -Uncertain etiology, previously felt to possibly be tachy-mediated -Check echo -Will benefit from ischemic evaluation once his infection is resolved  -Continue metoprolol as above -Escalate evidence-based heart failure therapy as indicated and able based on vitals  4. Acute on CKD stage II: -Likely ATN in the setting of his hypotension and infection as above -IV fluids per IM -Would avoid digoxin moving forward  5. HTN: -BP has ranged from the 70s to 180s systolic this admission -Hold amlodipine and benazepril for added BP room for heart rate control as above -Metoprolol as above  6. HLD: -Lipitor -Recent lipid panel from 02/2018 with an LDL of 49  7. DM2: -Check A1c -Per IM  8. Chronic lower extremity DVT: -Followed by Kateri Mc -Continue ASA 325 mg daily as directed by [facility] -Xarelto as above    For questions or updates, please contact [facility] Please consult [email or web address] for contact info under Cardiology/STEMI.   Signed, Eula Listen, PA-C [facility] Pager: [phone] 08/17/2018, 7:58 AM

## 2018-08-17 NOTE — Progress Notes (Signed)
[employer] [facility] - [place] [facility]   PATIENT NAME: Jeff [name]    MR#:  [id number]  DATE OF BIRTH:  Feb 06, 1951  SUBJECTIVE:  Patient presents to the emergency room due to left leg swelling and redness  REVIEW OF SYSTEMS:    Review of Systems  Constitutional: Negative for fever, chills weight loss HENT: Negative for ear pain, nosebleeds, congestion, facial swelling, rhinorrhea, neck pain, neck stiffness and ear discharge.   Respiratory: Negative for cough, shortness of breath, wheezing  Cardiovascular: Negative for chest pain, palpitations and leg swelling.  Gastrointestinal: Negative for heartburn, abdominal pain, vomiting, diarrhea or consitpation Genitourinary: Negative for dysuria, urgency, frequency, hematuria Musculoskeletal: Negative for back pain or joint pain Neurological: Negative for dizziness, seizures, syncope, focal weakness,  numbness and headaches.  Hematological: Does not bruise/bleed easily.  Psychiatric/Behavioral: Negative for hallucinations, confusion, dysphoric mood Left leg with erythema and swelling   Tolerating Diet: yes      DRUG ALLERGIES:  No Known Allergies  VITALS:  Blood pressure (!) 143/85, pulse (!) 111, temperature 99.9 F (37.7 C), temperature source Oral, resp. rate 19, height 5\' 11"  (1.803 m), weight 125.7 kg, SpO2 98 %.  PHYSICAL EXAMINATION:  Constitutional: Appears well-developed and well-nourished. No distress. HENT: Normocephalic. Marland Kitchen Oropharynx is clear and moist.  Eyes: Conjunctivae and EOM are normal. PERRLA, no scleral icterus.  Neck: Normal ROM. Neck supple. No JVD. No tracheal deviation. CVS: RRR, S1/S2 +, no murmurs, no gallops, no carotid bruit.  Pulmonary: Effort and breath sounds normal, no stridor, rhonchi, wheezes, rales.  Abdominal: Soft. BS +,  no distension, tenderness, rebound or guarding.  Musculoskeletal: Normal range of motion.  1+ lower extremity left leg edema and tenderness.  Neuro: Alert. CN  2-12 grossly intact. No focal deficits. Skin: Left leg with cellulitis psychiatric: Normal mood and affect.      LABORATORY PANEL:   CBC Recent Labs  Lab 08/17/18 0250  WBC 15.5*  HGB 15.4  HCT 47.0  PLT 164   ------------------------------------------------------------------------------------------------------------------  Chemistries  Recent Labs  Lab 08/17/18 0250  NA 140  K 4.3  CL 109  CO2 21*  GLUCOSE 203*  BUN 34*  CREATININE 1.54*  CALCIUM 8.0*  MG 1.5*  AST 19  ALT 14  ALKPHOS 48  BILITOT 1.5*   ------------------------------------------------------------------------------------------------------------------  Cardiac Enzymes Recent Labs  Lab 08/16/18 1559  TROPONINI <0.03   ------------------------------------------------------------------------------------------------------------------  RADIOLOGY:  US Venous Img Lower Unilateral Left  Result Date: 08/17/2018 CLINICAL DATA:  Left lower extremity pain and edema for past year. History of DVT. Patient is currently on anticoagulation. Evaluate for acute or chronic DVT. EXAM: LEFT LOWER EXTREMITY VENOUS DOPPLER ULTRASOUND TECHNIQUE: Gray-scale sonography with graded compression, as well as color Doppler and duplex ultrasound were performed to evaluate the lower extremity deep venous systems from the level of the common femoral vein and including the common femoral, femoral, profunda femoral, popliteal and calf veins including the posterior tibial, peroneal and gastrocnemius veins when visible. The superficial great saphenous vein was also interrogated. Spectral Doppler was utilized to evaluate flow at rest and with distal augmentation maneuvers in the common femoral, femoral and popliteal veins. COMPARISON:  Left lower extremity venous Doppler ultrasound-11/22/2017 FINDINGS: Contralateral Common Femoral Vein: Respiratory phasicity is normal and symmetric with the symptomatic side. No evidence of thrombus. Normal  compressibility. Common Femoral Vein: No evidence of acute or chronic thrombus. Normal compressibility, respiratory phasicity and response to augmentation. Saphenofemoral Junction: No evidence of thrombus. Normal compressibility and flow  on color Doppler imaging. Profunda Femoral Vein: No evidence of acute or chronic thrombus. Normal compressibility and flow on color Doppler imaging. Femoral Vein: There is circumferential near occlusive wall thickening/chronic DVT involving the proximal (images 16 and 17), mid (images 19 and 21) and distal (images 22 and 23) aspects of the left femoral vein. Popliteal Vein: There is hypoechoic near occlusive wall thickening/chronic DVT involving the interrogated portions of the left popliteal vein. Calf Veins: Appear patent where imaged. Superficial Great Saphenous Vein: No evidence of thrombus. Normal compressibility. Venous Reflux:  None. Other Findings: There is mixed echogenic occlusive DVT within the left lesser saphenous vein (images 45 through 50). Left inguinal lymph nodes are not enlarged by size criteria with index left inguinal lymph node measuring 1.5 cm in diameter and maintaining a benign fatty hilum. IMPRESSION: 1. No evidence of acute DVT within the left lower extremity. 2. Residual near occlusive wall thickening/chronic DVT involving the left femoral and popliteal veins, improved compared to the 11/2017 examination. Overall clot burden has significantly reduced compared to the 11/2017 examination. 3. Chronic occlusive superficial thrombophlebitis involving the interrogated portions of the left lesser saphenous vein. Electronically Signed   By: Simonne Come M.D.   On: 08/17/2018 09:20   Dg Chest Port 1 View  Result Date: 08/16/2018 CLINICAL DATA:  Per pt he was working out in the yard, had chest tightness in the left of his chest that did not radiate. + diaphoresis, nausea, dry heaves. EXAM: PORTABLE CHEST 1 VIEW COMPARISON:  11/20/2017 FINDINGS: Lung volumes are  low. There is elevation right hemidiaphragm. There is opacity noted at the right lung base that is likely atelectasis. Lungs are otherwise clear. No convincing pleural effusion.  No pneumothorax. Cardiac silhouette is normal in size. No mediastinal or hilar masses or convincing adenopathy. Skeletal structures are grossly intact. IMPRESSION: No acute cardiopulmonary disease. Electronically Signed   By: Amie Portland M.D.   On: 08/16/2018 16:23     ASSESSMENT AND PLAN:   67 year old male with known permanent atrial fibrillation, chronic left lower extremity DVT status post IVC filter placement and removal, chronic systolic heart failure ejection fraction 40- 45% and essential hypertension who presents to the ER due to lower extremity swelling.  1.  Sepsis: Patient presents with fever and tachycardia.  1 out of 2 blood cultures are positive for Streptococcus species.  Continue Rocephin.  Follow-up on final blood cultures.  2.  Left lower extremity cellulitis: Continue Rocephin.  Dopplers were negative for acute DVT. Elevate left leg 3.  History of known left lower extremity DVT: Patient follows up with [facility] and has an appointment next week Continue Xarelto 4.  Chronic systolic heart failure ejection fraction 40 to 45% without signs of exacerbation: 5.  Permanent atrial fibrillation: Continue metoprolol and diltiazem PRN Cardiology consultation appreciated  6.  Diabetes: Continue sliding scale 7.  Hyperlipidemia: Continue statin Management plans discussed with the patient and he is in agreement.  CODE STATUS: Full  TOTAL TIME TAKING CARE OF THIS PATIENT: 30 minutes.     POSSIBLE D/C 1 to 2 days, DEPENDING ON CLINICAL CONDITION.   Ilay Capshaw M.D on 08/17/2018 at 12:30 PM  Between 7am to 6pm - Pager - [phone] After 6pm go to [email or web address] - password [email or web address]  [facility]  Office  [phone]  CC: Primary care physician; Steele Sizer, MD  Note: This  dictation was prepared with Dragon dictation along with smaller phrase technology. Any transcriptional errors that result from  this process are unintentional.

## 2018-08-17 NOTE — Progress Notes (Signed)
Pt's HR sustaining in 130's. Per orders, prn IV cardizem given.

## 2018-08-17 NOTE — Progress Notes (Signed)
PHARMACY - PHYSICIAN COMMUNICATION CRITICAL VALUE ALERT - BLOOD CULTURE IDENTIFICATION (BCID)  Jeff [name] is an 67 y.o. male who presented to [facility] on 08/16/2018 with a chief complaint of chest pain  Assessment:  Tmax 101.60F, HR 110's - 120's, WBC 11.4, CXR negative,  UA negative, LLE cellulitis in the setting of diabetes, 2/4 GPC BCID Streptococcus  Name of physician (or Provider) Contacted: Barbaraann Rondo  Current antibiotics: Vanc/cefepime  Changes to prescribed antibiotics recommended:  Recommendations accepted by provider - Will switch over to ceftriaxone 2g IV daily  Results for orders placed or performed during the hospital encounter of 08/16/18  Blood Culture ID Panel (Reflexed) (Collected: 08/16/2018  3:59 PM)  Result Value Ref Range   Enterococcus species NOT DETECTED NOT DETECTED   Listeria monocytogenes NOT DETECTED NOT DETECTED   Staphylococcus species NOT DETECTED NOT DETECTED   Staphylococcus aureus (BCID) NOT DETECTED NOT DETECTED   Streptococcus species DETECTED (A) NOT DETECTED   Streptococcus agalactiae NOT DETECTED NOT DETECTED   Streptococcus pneumoniae NOT DETECTED NOT DETECTED   Streptococcus pyogenes NOT DETECTED NOT DETECTED   Acinetobacter baumannii NOT DETECTED NOT DETECTED   Enterobacteriaceae species NOT DETECTED NOT DETECTED   Enterobacter cloacae complex NOT DETECTED NOT DETECTED   Escherichia coli NOT DETECTED NOT DETECTED   Klebsiella oxytoca NOT DETECTED NOT DETECTED   Klebsiella pneumoniae NOT DETECTED NOT DETECTED   Proteus species NOT DETECTED NOT DETECTED   Serratia marcescens NOT DETECTED NOT DETECTED   Haemophilus influenzae NOT DETECTED NOT DETECTED   Neisseria meningitidis NOT DETECTED NOT DETECTED   Pseudomonas aeruginosa NOT DETECTED NOT DETECTED   Candida albicans NOT DETECTED NOT DETECTED   Candida glabrata NOT DETECTED NOT DETECTED   Candida krusei NOT DETECTED NOT DETECTED   Candida parapsilosis NOT DETECTED NOT  DETECTED   Candida tropicalis NOT DETECTED NOT DETECTED   Thomasene Ripple, PharmD, BCPS Clinical Pharmacist 08/17/2018

## 2018-08-18 DIAGNOSIS — I4821 Permanent atrial fibrillation: Secondary | ICD-10-CM

## 2018-08-18 LAB — HIV ANTIBODY (ROUTINE TESTING W REFLEX): HIV SCREEN 4TH GENERATION: NONREACTIVE

## 2018-08-18 LAB — GLUCOSE, CAPILLARY
GLUCOSE-CAPILLARY: 148 mg/dL — AB (ref 70–99)
GLUCOSE-CAPILLARY: 154 mg/dL — AB (ref 70–99)
Glucose-Capillary: 136 mg/dL — ABNORMAL HIGH (ref 70–99)
Glucose-Capillary: 137 mg/dL — ABNORMAL HIGH (ref 70–99)

## 2018-08-18 MED ORDER — DILTIAZEM HCL 30 MG PO TABS
30.0000 mg | ORAL_TABLET | Freq: Three times a day (TID) | ORAL | Status: DC
  Administered 2018-08-18 – 2018-08-19 (×4): 30 mg via ORAL
  Filled 2018-08-18 (×4): qty 1

## 2018-08-18 MED ORDER — MAGNESIUM SULFATE 2 GM/50ML IV SOLN
2.0000 g | Freq: Once | INTRAVENOUS | Status: AC
  Administered 2018-08-18: 2 g via INTRAVENOUS
  Filled 2018-08-18: qty 50

## 2018-08-18 NOTE — Progress Notes (Addendum)
[employer] - [place] at [facility]   PATIENT NAME: Jeff [name]    MR#:  [id number]  DATE OF BIRTH:  [birth year]  SUBJECTIVE:   patient still with fevers   REVIEW OF SYSTEMS:    Review of Systems  Constitutional: Negative for fever, chills weight loss HENT: Negative for ear pain, nosebleeds, congestion, facial swelling, rhinorrhea, neck pain, neck stiffness and ear discharge.   Respiratory: Negative for cough, shortness of breath, wheezing  Cardiovascular: Negative for chest pain, palpitations and leg swelling.  Gastrointestinal: Negative for heartburn, abdominal pain, vomiting, diarrhea or consitpation Genitourinary: Negative for dysuria, urgency, frequency, hematuria Musculoskeletal: Negative for back pain or joint pain Neurological: Negative for dizziness, seizures, syncope, focal weakness,  numbness and headaches.  Hematological: Does not bruise/bleed easily.  Psychiatric/Behavioral: Negative for hallucinations, confusion, dysphoric mood Left leg with erythema and swelling (IMPROVED)   Tolerating Diet: yes      DRUG ALLERGIES:  No Known Allergies  VITALS:  Blood pressure (!) 148/93, pulse (!) 108, temperature 99.1 F (37.3 C), temperature source Oral, resp. rate 14, height 5\' 11"  (1.803 m), weight 127.2 kg, SpO2 96 %.  PHYSICAL EXAMINATION:  Constitutional: Appears well-developed and well-nourished. No distress. HENT: Normocephalic. Marland Kitchen Oropharynx is clear and moist.  Eyes: Conjunctivae and EOM are normal. PERRLA, no scleral icterus.  Neck: Normal ROM. Neck supple. No JVD. No tracheal deviation. CVS: irr irr tachycardic, no murmurs, no gallops, no carotid bruit.  Pulmonary: Effort and breath sounds normal, no stridor, rhonchi, wheezes, rales.  Abdominal: Soft. BS +,  no distension, tenderness, rebound or guarding.  Musculoskeletal: Normal range of motion.  1+ lower extremity left leg edema and tenderness.  Neuro: Alert. CN 2-12 grossly intact. No focal  deficits. Skin: Left leg with cellulitis psychiatric: Normal mood and affect.      LABORATORY PANEL:   CBC Recent Labs  Lab 08/17/18 0250  WBC 15.5*  HGB 15.4  HCT 47.0  PLT 164   ------------------------------------------------------------------------------------------------------------------  Chemistries  Recent Labs  Lab 08/17/18 0250  NA 140  K 4.3  CL 109  CO2 21*  GLUCOSE 203*  BUN 34*  CREATININE 1.54*  CALCIUM 8.0*  MG 1.5*  AST 19  ALT 14  ALKPHOS 48  BILITOT 1.5*   ------------------------------------------------------------------------------------------------------------------  Cardiac Enzymes Recent Labs  Lab 08/16/18 1559  TROPONINI <0.03   ------------------------------------------------------------------------------------------------------------------  RADIOLOGY:  US Venous Img Lower Unilateral Left  Result Date: 08/17/2018 CLINICAL DATA:  Left lower extremity pain and edema for past year. History of DVT. Patient is currently on anticoagulation. Evaluate for acute or chronic DVT. EXAM: LEFT LOWER EXTREMITY VENOUS DOPPLER ULTRASOUND TECHNIQUE: Gray-scale sonography with graded compression, as well as color Doppler and duplex ultrasound were performed to evaluate the lower extremity deep venous systems from the level of the common femoral vein and including the common femoral, femoral, profunda femoral, popliteal and calf veins including the posterior tibial, peroneal and gastrocnemius veins when visible. The superficial great saphenous vein was also interrogated. Spectral Doppler was utilized to evaluate flow at rest and with distal augmentation maneuvers in the common femoral, femoral and popliteal veins. COMPARISON:  Left lower extremity venous Doppler ultrasound-11/22/2017 FINDINGS: Contralateral Common Femoral Vein: Respiratory phasicity is normal and symmetric with the symptomatic side. No evidence of thrombus. Normal compressibility. Common  Femoral Vein: No evidence of acute or chronic thrombus. Normal compressibility, respiratory phasicity and response to augmentation. Saphenofemoral Junction: No evidence of thrombus. Normal compressibility and flow on color Doppler imaging. Profunda Femoral  Vein: No evidence of acute or chronic thrombus. Normal compressibility and flow on color Doppler imaging. Femoral Vein: There is circumferential near occlusive wall thickening/chronic DVT involving the proximal (images 16 and 17), mid (images 19 and 21) and distal (images 22 and 23) aspects of the left femoral vein. Popliteal Vein: There is hypoechoic near occlusive wall thickening/chronic DVT involving the interrogated portions of the left popliteal vein. Calf Veins: Appear patent where imaged. Superficial Great Saphenous Vein: No evidence of thrombus. Normal compressibility. Venous Reflux:  None. Other Findings: There is mixed echogenic occlusive DVT within the left lesser saphenous vein (images 45 through 50). Left inguinal lymph nodes are not enlarged by size criteria with index left inguinal lymph node measuring 1.5 cm in diameter and maintaining a benign fatty hilum. IMPRESSION: 1. No evidence of acute DVT within the left lower extremity. 2. Residual near occlusive wall thickening/chronic DVT involving the left femoral and popliteal veins, improved compared to the 11/2017 examination. Overall clot burden has significantly reduced compared to the 11/2017 examination. 3. Chronic occlusive superficial thrombophlebitis involving the interrogated portions of the left lesser saphenous vein. Electronically Signed   By: Simonne Come M.D.   On: 08/17/2018 09:20   Dg Chest Port 1 View  Result Date: 08/16/2018 CLINICAL DATA:  Per pt he was working out in the yard, had chest tightness in the left of his chest that did not radiate. + diaphoresis, nausea, dry heaves. EXAM: PORTABLE CHEST 1 VIEW COMPARISON:  11/20/2017 FINDINGS: Lung volumes are low. There is elevation  right hemidiaphragm. There is opacity noted at the right lung base that is likely atelectasis. Lungs are otherwise clear. No convincing pleural effusion.  No pneumothorax. Cardiac silhouette is normal in size. No mediastinal or hilar masses or convincing adenopathy. Skeletal structures are grossly intact. IMPRESSION: No acute cardiopulmonary disease. Electronically Signed   By: Amie Portland M.D.   On: 08/16/2018 16:23     ASSESSMENT AND PLAN:   67 year old male with known permanent atrial fibrillation, chronic left lower extremity DVT status post IVC filter placement and removal, chronic systolic heart failure ejection fraction 40- 45% and essential hypertension who presents to the ER due to lower extremity swelling.  1.  Sepsis: Patient presented with fever and tachycardia.  Sepsis is from strep group G from cellulitis of lower extremity [place] up on susceptibilities  ID consultation for further evaluation  2.  Left lower extremity cellulitis: Continue Rocephin.  Dopplers were negative for acute DVT. Continue to elevate left leg   3.  History of known left lower extremity DVT: Patient follows up with [facility] and has an appointment next week Continue Xarelto  4.  Chronic systolic heart failure ejection fraction 40 to 45% without signs of exacerbation: 5.  Permanent atrial fibrillation: Heart rate is slowly improving continue metoprolol and diltiazem  Cardiology consultation appreciated  6.  Diabetes: Continue sliding scale 7.  Hyperlipidemia: Continue statin  Discussed with cardiology Management plans discussed with the patient and he is in agreement.  CODE STATUS: Full  TOTAL TIME TAKING CARE OF THIS PATIENT: 24 minutes.     POSSIBLE D/C 1 to 2 days, DEPENDING ON CLINICAL CONDITION.   Randie Bloodgood M.D on 08/18/2018 at 11:55 AM  Between 7am to 6pm - Pager - [phone] After 6pm go to [email or web address] - password [email or web address]  [facility]  Office   [phone]  CC: Primary care physician; Steele Sizer, MD  Note: This dictation was prepared with Dragon dictation along with  smaller phrase technology. Any transcriptional errors that result from this process are unintentional. 

## 2018-08-18 NOTE — Progress Notes (Signed)
Progress Note  Patient Name: Jeff [name] Date of Encounter: 08/18/2018  Primary Cardiologist: Mariah Milling  Subjective   Dull headache this morning. No chest pain, SOB, or palpitations. T max of 102.4 over the past 24 hours. Ventricular rates remain in the 110s to 140s bpm.    Inpatient Medications    Scheduled Meds: . atorvastatin  10 mg Oral Daily  . docusate sodium  100 mg Oral BID  . fluticasone  2 spray Each Nare Daily  . insulin aspart  0-5 Units Subcutaneous QHS  . insulin aspart  0-9 Units Subcutaneous TID WC  . metoprolol tartrate  25 mg Oral Q6H  . rivaroxaban  20 mg Oral Q supper   Continuous Infusions: . cefTRIAXone (ROCEPHIN)  IV 2 g (08/18/18 0234)   PRN Meds: acetaminophen **OR** acetaminophen, diltiazem, ondansetron **OR** ondansetron (ZOFRAN) IV, traMADol   Vital Signs    Vitals:   08/17/18 1924 08/17/18 2143 08/18/18 0226 08/18/18 0329  BP: (!) 158/95 137/87 131/86 116/73  Pulse: (!) 126 (!) 101 95 (!) 111  Resp:    18  Temp:    99.1 F (37.3 C)  TempSrc:    Oral  SpO2:   92% 96%  Weight:    127.2 kg  Height:        Intake/Output Summary (Last 24 hours) at 08/18/2018 0736 Last data filed at 08/18/2018 0300 Gross per 24 hour  Intake -  Output 2250 ml  Net -2250 ml   Filed Weights   08/16/18 2032 08/17/18 1800 08/18/18 0329  Weight: 125.7 kg 125 kg 127.2 kg    Telemetry    Afib with RVR with ventricular rates from the low 100s to 140s bpm - Personally Reviewed  ECG    n/a - Personally Reviewed  Physical Exam   GEN: No acute distress.   Neck: No JVD. Cardiac: Mildly tachycardic, irregularly irregular, no murmurs, rubs, or gallops.  Respiratory: Clear to auscultation bilaterally.  GI: Soft, nontender, non-distended.   MS: Swelling of the left leg with warmth and erythema; No deformity. Neuro:  Alert and oriented x 3; Nonfocal.  Psych: Normal affect.  Labs    Chemistry Recent Labs  Lab 08/16/18 1559 08/17/18 0250  NA  138 140  K 4.8 4.3  CL 101 109  CO2 23 21*  GLUCOSE 169* 203*  BUN 31* 34*  CREATININE 1.47* 1.54*  CALCIUM 9.2 8.0*  PROT 7.7 6.4*  ALBUMIN 4.7 3.6  AST 24 19  ALT 18 14  ALKPHOS 54 48  BILITOT 1.7* 1.5*  GFRNONAA 48* 45*  GFRAA 55* 52*  ANIONGAP 14 10     Hematology Recent Labs  Lab 08/16/18 1559 08/17/18 0250  WBC 11.4* 15.5*  RBC 5.39 5.32  HGB 15.8 15.4  HCT 47.5 47.0  MCV 88.1 88.3  MCH 29.3 28.9  MCHC 33.3 32.8  RDW 13.0 13.2  PLT 170 164    Cardiac Enzymes Recent Labs  Lab 08/16/18 1559  TROPONINI <0.03   No results for input(s): TROPIPOC in the last 168 hours.   BNPNo results for input(s): BNP, PROBNP in the last 168 hours.   DDimer No results for input(s): DDIMER in the last 168 hours.   Radiology    US Venous Img Lower Unilateral Left  Result Date: 08/17/2018 IMPRESSION: 1. No evidence of acute DVT within the left lower extremity. 2. Residual near occlusive wall thickening/chronic DVT involving the left femoral and popliteal veins, improved compared to the 11/2017 examination.  Overall clot burden has significantly reduced compared to the 11/2017 examination. 3. Chronic occlusive superficial thrombophlebitis involving the interrogated portions of the left lesser saphenous vein. Electronically Signed   By: Simonne Come M.D.   On: 08/17/2018 09:20   Dg Chest Port 1 View  Result Date: 08/16/2018 IMPRESSION: No acute cardiopulmonary disease. Electronically Signed   By: Amie Portland M.D.   On: 08/16/2018 16:23    Cardiac Studies   2-D Echo 08/17/2018: Study Conclusions  - Left ventricle: The cavity size was normal. There was mild   concentric hypertrophy. Systolic function was normal. The   estimated ejection fraction was in the range of 50% to 55%. - Left atrium: The atrium was mildly dilated. - Pulmonary arteries: Systolic pressure could not be accurately   estimated.  Impressions:  - very challenging image quality even with  Definitiy.  Patient Profile     67 y.o. male with history of permanent Afib on Xarelto, HFrEF of uncertain etiology, chronic DVT s/p prior IVC filter s/p extraction s/p thrombectomy at [facility], syncope felt to be secondary to dehydration, CKD stage II, DM2, HTN, and HLD who is being seen today for the evaluation of permanent Afib with RVR.  Assessment & Plan    1. Permanent Afib with RVR: -Tachycardic rates in the setting of his sepsis, lower extremity cellulitis, and bacteremia  -Ventricular rates remain elevated in the 110s bpm mostly with occasional reading in the 130s to 140s bpm -Add diltiazem 30 mg q 8 hours for added rate control with hold parameters for BP -Continue Lopressor to 25 mg q 6 hours for added rate control, hopefully consolidate on 10/12 -As his infection improves, his rates should as well -Continue to hold digoxin  -CHADS2VASc at least 5 (CHF, HTN, age x 1, DM, vascular disease) -Continue Xarelto 20 mg q dinner given CrCl 82 mL/min  -Potassium at goal -Replete magnesium to goal 2.0 -TSH normal  2. Sepsis secondary to cellulitis of the left lower extremity and streptococcus bacteremia: -Driving #1 -ABX per IM  3. HFrEF: -Felt to possibly be tachy-mediated -EF now normalized by echo this admission -Continue metoprolol as above  4. Acute on CKD stage II: -Likely ATN in the setting of his hypotension and infection as above -IV fluids per IM -Would avoid digoxin moving forward -No labs this morning  5. HTN: -BP has ranged from the 110s to 160s systolic over the past 245 hours -Amlodipine and benazepril held at time of cardiology consult for added BP room for heart rate control as above -Metoprolol as above  6. HLD: -Lipitor -Recent lipid panel from 02/2018 with an LDL of 49  7. DM2: -Per IM  8. Chronic lower extremity DVT: -Followed by Kateri Mc -Xarelto as above  9. Hypomagnesemia: -Replete to goal 2.0   For questions or updates, please contact  [facility] Please consult [email or web address] for contact info under Cardiology/STEMI.    Signed, Eula Listen, PA-C [facility] Pager: [phone] 08/18/2018, 7:36 AM

## 2018-08-18 NOTE — Care Management Important Message (Signed)
Copy of signed IM left with patient in room.  

## 2018-08-19 ENCOUNTER — Inpatient Hospital Stay: Payer: Self-pay

## 2018-08-19 ENCOUNTER — Inpatient Hospital Stay: Payer: Medicare HMO

## 2018-08-19 DIAGNOSIS — I4819 Other persistent atrial fibrillation: Secondary | ICD-10-CM

## 2018-08-19 DIAGNOSIS — I10 Essential (primary) hypertension: Secondary | ICD-10-CM

## 2018-08-19 DIAGNOSIS — L03116 Cellulitis of left lower limb: Secondary | ICD-10-CM

## 2018-08-19 LAB — CULTURE, BLOOD (ROUTINE X 2): SPECIAL REQUESTS: ADEQUATE

## 2018-08-19 LAB — GLUCOSE, CAPILLARY: Glucose-Capillary: 134 mg/dL — ABNORMAL HIGH (ref 70–99)

## 2018-08-19 MED ORDER — CEPHALEXIN 500 MG PO CAPS: 500.0000 mg | ORAL_CAPSULE | Freq: Four times a day (QID) | ORAL | 0 refills | Status: AC

## 2018-08-19 MED ORDER — SODIUM CHLORIDE 0.9 % IV SOLN: 2.0000 g | INTRAVENOUS | 0 refills | Status: AC

## 2018-08-19 MED ORDER — DILTIAZEM HCL ER COATED BEADS 120 MG PO CP24: 120.0000 mg | ORAL_CAPSULE | Freq: Every day | ORAL | 11 refills | Status: DC

## 2018-08-19 MED ORDER — SODIUM CHLORIDE 0.9 % IV SOLN
INTRAVENOUS | Status: DC | PRN
  Administered 2018-08-19: 500 mL via INTRAVENOUS

## 2018-08-19 NOTE — Discharge Summary (Signed)
[facility] - [place] [facility]   PATIENT NAME: Jeff [name]    MR#:  [id number]  DATE OF BIRTH:  1951/05/08  DATE OF ADMISSION:  08/16/2018 ADMITTING PHYSICIAN: Ramonita Lab, MD  DATE OF DISCHARGE: August 19, 2018  PRIMARY CARE PHYSICIAN: Steele Sizer, MD    ADMISSION DIAGNOSIS:  Pain [R52] Fever, unspecified fever cause [R50.9]  DISCHARGE DIAGNOSIS:  Active Problems:   Sepsis (HCC)   SECONDARY DIAGNOSIS:   Past Medical History:  Diagnosis Date  . Chronic deep vein thrombosis (DVT) (HCC)   . Chronic low back pain   . Chronic systolic heart failure (HCC)    a. TTE 2011 with EF 40-45% per notes  . Diabetes mellitus without complication (HCC)   . Erectile dysfunction   . HLD (hyperlipidemia)   . Hypertension   . Hypogonadism in male   . Permanent atrial fibrillation    a. s/p DCCV 2011; b. redeveloped Afib in 2016; c. CHADS2VASc at least 5 (CHF, HTN, age x 1, DM, vascular disease); d. on Xarelto    HOSPITAL COURSE:   67 year old male with known permanent atrial fibrillation, chronic left lower extremity DVT status post IVC filter placement and removal, chronic systolic heart failure ejection fraction 40- 45% and essential hypertension who presents to the ER due to lower extremity swelling.  1.  Sepsis: Patient presented with fever and tachycardia.  Sepsis is from strep group G from cellulitis of lower extremity I spoke with [facility] infectious disease consultant who is recommending 1 week of IV antibiotics with PICC line then changed to oral antibiotics.  He will be discharged on IV Rocephin with PICC line and then followed by oral Keflex.  Within that timeframe he should follow-up with Dr. Noralee Space.    2.  Left lower extremity cellulitis: Cellulitis has shown improvement with IV antibiotics.  Continue to elevate the leg while at home.    3.  History of known left lower extremity DVT: Patient follows up with [facility] and has an appointment next  Wednesday at noon Will continue Xarelto  4.  Chronic systolic heart failure ejection fraction 40 to 45% without signs of exacerbation: 5.  Permanent atrial fibrillation: He was eval by cardiology.  Heart rate has improved.  Patient will continue on oral diltiazem and metoprolol.  6.  Diabetes: He will continue ADA diet metformin with outpatient follow-up  7.  Hyperlipidemia: He will continue statin   DISCHARGE CONDITIONS AND DIET:   Stable for discharge on heart healthy diabetic diet  CONSULTS OBTAINED:  Treatment Team:  Lewayne Bunting, MD Iran Ouch, MD Ginnie Smart, MD  DRUG ALLERGIES:  No Known Allergies  DISCHARGE MEDICATIONS:   Allergies as of 08/19/2018   No Known Allergies     Medication List    STOP taking these medications   amLODipine 10 MG tablet Commonly known as:  NORVASC   benazepril 20 MG tablet Commonly known as:  LOTENSIN   digoxin 0.25 MG tablet Commonly known as:  LANOXIN     TAKE these medications   aspirin EC 325 MG tablet Take by mouth.   atorvastatin 10 MG tablet Commonly known as:  LIPITOR Take 1 tablet (10 mg total) by mouth daily.   cefTRIAXone 2 g in sodium chloride 0.9 % 100 mL Inject 2 g into the vein daily for 6 days. Start taking on:  08/20/2018   cephALEXin 500 MG capsule Commonly known as:  KEFLEX Take 1 capsule (500 mg total)  by mouth 4 (four) times daily for 7 days. Start taking on:  08/26/2018   diltiazem 120 MG 24 hr capsule Commonly known as:  CARDIZEM CD Take 1 capsule (120 mg total) by mouth daily.   fluticasone 50 MCG/ACT nasal spray Commonly known as:  FLONASE Place 2 sprays into both nostrils daily.   glucose blood test strip Use as instructed   metFORMIN 1000 MG tablet Commonly known as:  GLUCOPHAGE Take 1 tablet (1,000 mg total) by mouth 2 (two) times daily with a meal.   metoprolol tartrate 50 MG tablet Commonly known as:  LOPRESSOR Take 1 tablet (50 mg total) by mouth 2 (two)  times daily.   rivaroxaban 20 MG Tabs tablet Commonly known as:  XARELTO Take 1 tablet (20 mg total) by mouth daily with supper.         Today   CHIEF COMPLAINT:  He is doing well this morning and anxious to go home today.  His daughter is getting married at 3:00 this afternoon   VITAL SIGNS:  Blood pressure (!) 144/91, pulse 98, temperature (!) 97.5 F (36.4 C), temperature source Oral, resp. rate 20, height 5\' 11"  (1.803 m), weight 127.6 kg, SpO2 94 %.   REVIEW OF SYSTEMS:  Review of Systems  Constitutional: Negative.  Negative for chills, fever and malaise/fatigue.  HENT: Negative.  Negative for ear discharge, ear pain, hearing loss, nosebleeds and sore throat.   Eyes: Negative.  Negative for blurred vision and pain.  Respiratory: Negative.  Negative for cough, hemoptysis, shortness of breath and wheezing.   Cardiovascular: Negative.  Negative for chest pain, palpitations and leg swelling.  Gastrointestinal: Negative.  Negative for abdominal pain, blood in stool, diarrhea, nausea and vomiting.  Genitourinary: Negative.  Negative for dysuria.  Musculoskeletal: Negative.  Negative for back pain.  Skin:       Improving cellulitis  Neurological: Negative for dizziness, tremors, speech change, focal weakness, seizures and headaches.  Endo/Heme/Allergies: Negative.  Does not bruise/bleed easily.  Psychiatric/Behavioral: Negative.  Negative for depression, hallucinations and suicidal ideas.     PHYSICAL EXAMINATION:  GENERAL:  67 y.o.-year-old patient lying in the bed with no acute distress.  NECK:  Supple, no jugular venous distention. No thyroid enlargement, no tenderness.  LUNGS: Normal breath sounds bilaterally, no wheezing, rales,rhonchi  No use of accessory muscles of respiration.  CARDIOVASCULAR: Irregular, irregular normal. No murmurs, rubs, or gallops.  ABDOMEN: Soft, non-tender, non-distended. Bowel sounds present. No organomegaly or mass.  EXTREMITIES: No pedal  edema, cyanosis, or clubbing.  PSYCHIATRIC: The patient is alert and oriented x 3.  SKIN: Left leg cellulitis is improved  DATA REVIEW:   CBC Recent Labs  Lab 08/17/18 0250  WBC 15.5*  HGB 15.4  HCT 47.0  PLT 164    Chemistries  Recent Labs  Lab 08/17/18 0250  NA 140  K 4.3  CL 109  CO2 21*  GLUCOSE 203*  BUN 34*  CREATININE 1.54*  CALCIUM 8.0*  MG 1.5*  AST 19  ALT 14  ALKPHOS 48  BILITOT 1.5*    Cardiac Enzymes Recent Labs  Lab 08/16/18 1559  TROPONINI <0.03    Microbiology Results  @MICRORSLT48 @  RADIOLOGY:  US Venous Img Lower Unilateral Left  Result Date: 08/17/2018 CLINICAL DATA:  Left lower extremity pain and edema for past year. History of DVT. Patient is currently on anticoagulation. Evaluate for acute or chronic DVT. EXAM: LEFT LOWER EXTREMITY VENOUS DOPPLER ULTRASOUND TECHNIQUE: Gray-scale sonography with graded compression, as well  as color Doppler and duplex ultrasound were performed to evaluate the lower extremity deep venous systems from the level of the common femoral vein and including the common femoral, femoral, profunda femoral, popliteal and calf veins including the posterior tibial, peroneal and gastrocnemius veins when visible. The superficial great saphenous vein was also interrogated. Spectral Doppler was utilized to evaluate flow at rest and with distal augmentation maneuvers in the common femoral, femoral and popliteal veins. COMPARISON:  Left lower extremity venous Doppler ultrasound-11/22/2017 FINDINGS: Contralateral Common Femoral Vein: Respiratory phasicity is normal and symmetric with the symptomatic side. No evidence of thrombus. Normal compressibility. Common Femoral Vein: No evidence of acute or chronic thrombus. Normal compressibility, respiratory phasicity and response to augmentation. Saphenofemoral Junction: No evidence of thrombus. Normal compressibility and flow on color Doppler imaging. Profunda Femoral Vein: No evidence of  acute or chronic thrombus. Normal compressibility and flow on color Doppler imaging. Femoral Vein: There is circumferential near occlusive wall thickening/chronic DVT involving the proximal (images 16 and 17), mid (images 19 and 21) and distal (images 22 and 23) aspects of the left femoral vein. Popliteal Vein: There is hypoechoic near occlusive wall thickening/chronic DVT involving the interrogated portions of the left popliteal vein. Calf Veins: Appear patent where imaged. Superficial Great Saphenous Vein: No evidence of thrombus. Normal compressibility. Venous Reflux:  None. Other Findings: There is mixed echogenic occlusive DVT within the left lesser saphenous vein (images 45 through 50). Left inguinal lymph nodes are not enlarged by size criteria with index left inguinal lymph node measuring 1.5 cm in diameter and maintaining a benign fatty hilum. IMPRESSION: 1. No evidence of acute DVT within the left lower extremity. 2. Residual near occlusive wall thickening/chronic DVT involving the left femoral and popliteal veins, improved compared to the 11/2017 examination. Overall clot burden has significantly reduced compared to the 11/2017 examination. 3. Chronic occlusive superficial thrombophlebitis involving the interrogated portions of the left lesser saphenous vein. Electronically Signed   By: Simonne Come M.D.   On: 08/17/2018 09:20   [place] Ekg Site Rite  Result Date: 08/19/2018 If Site Rite image not attached, placement could not be confirmed due to current cardiac rhythm.     Allergies as of 08/19/2018   No Known Allergies     Medication List    STOP taking these medications   amLODipine 10 MG tablet Commonly known as:  NORVASC   benazepril 20 MG tablet Commonly known as:  LOTENSIN   digoxin 0.25 MG tablet Commonly known as:  LANOXIN     TAKE these medications   aspirin EC 325 MG tablet Take by mouth.   atorvastatin 10 MG tablet Commonly known as:  LIPITOR Take 1 tablet (10 mg  total) by mouth daily.   cefTRIAXone 2 g in sodium chloride 0.9 % 100 mL Inject 2 g into the vein daily for 6 days. Start taking on:  08/20/2018   cephALEXin 500 MG capsule Commonly known as:  KEFLEX Take 1 capsule (500 mg total) by mouth 4 (four) times daily for 7 days. Start taking on:  08/26/2018   diltiazem 120 MG 24 hr capsule Commonly known as:  CARDIZEM CD Take 1 capsule (120 mg total) by mouth daily.   fluticasone 50 MCG/ACT nasal spray Commonly known as:  FLONASE Place 2 sprays into both nostrils daily.   glucose blood test strip Use as instructed   metFORMIN 1000 MG tablet Commonly known as:  GLUCOPHAGE Take 1 tablet (1,000 mg total) by mouth 2 (two) times  daily with a meal.   metoprolol tartrate 50 MG tablet Commonly known as:  LOPRESSOR Take 1 tablet (50 mg total) by mouth 2 (two) times daily.   rivaroxaban 20 MG Tabs tablet Commonly known as:  XARELTO Take 1 tablet (20 mg total) by mouth daily with supper.         Management plans discussed with the patient and he is in agreement. Stable for discharge   Patient should follow up with pcp  CODE STATUS:     Code Status Orders  (From admission, onward)         Start     Ordered   08/16/18 2049  Full code  Continuous     08/16/18 2048        Code Status History    Date Active Date Inactive Code Status Order ID Comments User Context   11/20/2017 1821 11/23/2017 1852 Full Code [id number]  Enid Baas, MD Inpatient    Advance Directive Documentation     Most Recent Value  Type of Advance Directive  Healthcare Power of Attorney  Pre-existing out of facility DNR order (yellow form or pink MOST form)  -  "MOST" Form in Place?  -      TOTAL TIME TAKING CARE OF THIS PATIENT: 38 minutes.    Note: This dictation was prepared with Dragon dictation along with smaller phrase technology. Any transcriptional errors that result from this process are unintentional.  Kimmie Berggren M.D on 08/19/2018 at  7:45 AM  Between 7am to 6pm - Pager - [phone] After 6pm go to [email or web address] - Social research officer, government  [facility]  Office  [phone]  CC: Primary care physician; Steele Sizer, MD

## 2018-08-19 NOTE — Progress Notes (Signed)
RN removed IV lines.  Patient is going home with a PICC.  Home health care has been arranged.  Patient left in a private vehicle.  Christean Grief, RN

## 2018-08-19 NOTE — Progress Notes (Signed)
Progress Note  Patient Name: Jeff [name] Date of Encounter: 08/19/2018  Primary Cardiologist: Mariah Milling  Subjective   Feeling much better.  Denies any sensation of atrial fibrillation. No dyspnea. Left leg hurts, but has been walking around without difficulty.  Ready to go home.  Has a wedding to get to.  Inpatient Medications    Scheduled Meds: . atorvastatin  10 mg Oral Daily  . diltiazem  30 mg Oral Q8H  . docusate sodium  100 mg Oral BID  . fluticasone  2 spray Each Nare Daily  . insulin aspart  0-5 Units Subcutaneous QHS  . insulin aspart  0-9 Units Subcutaneous TID WC  . metoprolol tartrate  25 mg Oral Q6H  . rivaroxaban  20 mg Oral Q supper   Continuous Infusions: . sodium chloride Stopped (08/19/18 0300)  . cefTRIAXone (ROCEPHIN)  IV 2 g (08/19/18 0224)   PRN Meds: sodium chloride, acetaminophen **OR** acetaminophen, diltiazem, ondansetron **OR** ondansetron (ZOFRAN) IV, traMADol   Vital Signs    Vitals:   08/19/18 0211 08/19/18 0405 08/19/18 0622 08/19/18 0721  BP: (!) 156/104 (!) 155/108 (!) 137/97 (!) 144/91  Pulse: (!) 104 96 87 98  Resp:  18  20  Temp:  98.5 F (36.9 C)  (!) 97.5 F (36.4 C)  TempSrc:  Oral  Oral  SpO2:  90%  94%  Weight:  127.6 kg    Height:        Intake/Output Summary (Last 24 hours) at 08/19/2018 0741 Last data filed at 08/19/2018 0624 Gross per 24 hour  Intake 321.02 ml  Output 1800 ml  Net -1478.98 ml   Filed Weights   08/17/18 1800 08/18/18 0329 08/19/18 0405  Weight: 125 kg 127.2 kg 127.6 kg    Telemetry    Afib with rates ranging from 90s to 100s with PVCs noted.- Personally Reviewed  ECG    n/a - Personally Reviewed  Physical Exam   GEN: No acute distress.  Resting comfortably in bed.  Has PICC line team in place. Neck: No JVD.  No carotid bruit. Cardiac:  Borderline tachycardic, with irregularly irregular rhythm.  No M/R/G. Respiratory:  CTA B, nonlabored and good air movement. GI:   Soft/NT/ND/NABS.  No extent. MS:  Left leg remains somewhat swollen and warm to touch with erythema -consistent with cellulitis. Neuro:  A&O x 3.  Nonfocal Psych: Normal mood and affect  Labs    Chemistry Recent Labs  Lab 08/16/18 1559 08/17/18 0250  NA 138 140  K 4.8 4.3  CL 101 109  CO2 23 21*  GLUCOSE 169* 203*  BUN 31* 34*  CREATININE 1.47* 1.54*  CALCIUM 9.2 8.0*  PROT 7.7 6.4*  ALBUMIN 4.7 3.6  AST 24 19  ALT 18 14  ALKPHOS 54 48  BILITOT 1.7* 1.5*  GFRNONAA 48* 45*  GFRAA 55* 52*  ANIONGAP 14 10     Hematology Recent Labs  Lab 08/16/18 1559 08/17/18 0250  WBC 11.4* 15.5*  RBC 5.39 5.32  HGB 15.8 15.4  HCT 47.5 47.0  MCV 88.1 88.3  MCH 29.3 28.9  MCHC 33.3 32.8  RDW 13.0 13.2  PLT 170 164    Cardiac Enzymes Recent Labs  Lab 08/16/18 1559  TROPONINI <0.03   No results for input(s): TROPIPOC in the last 168 hours.   BNPNo results for input(s): BNP, PROBNP in the last 168 hours.   DDimer No results for input(s): DDIMER in the last 168 hours.   Radiology  No new studies  US Venous Img Lower Unilateral Left  Result Date: 08/17/2018 IMPRESSION: 1. No evidence of acute DVT within the left lower extremity. 2. Residual near occlusive wall thickening/chronic DVT involving the left femoral and popliteal veins, improved compared to the 11/2017 examination. Overall clot burden has significantly reduced compared to the 11/2017 examination. 3. Chronic occlusive superficial thrombophlebitis involving the interrogated portions of the left lesser saphenous vein. Electronically Signed   By: Simonne Come M.D.   On: 08/17/2018 09:20   Dg Chest Port 1 View  Result Date: 08/16/2018 IMPRESSION: No acute cardiopulmonary disease. Electronically Signed   By: Amie Portland M.D.   On: 08/16/2018 16:23    Cardiac Studies   2-D Echo 08/17/2018: Mild concentric LVH with improved EF to 55%.  Mild LA dilation.  Unable to assess pulmonary pressure.  Very challenging  study.  Patient Profile     67 y.o. male with history of permanent Afib on Xarelto, HFrEF of uncertain etiology, chronic DVT s/p prior IVC filter s/p extraction s/p thrombectomy at [facility], syncope felt to be secondary to dehydration, CKD stage II, DM2, HTN, and HLD who is being seen followed by cardiology for the evaluation of permanent Afib with RVR.  Assessment & Plan    1. Permanent Afib with RVR-CHADS2VASc at least 5 (CHF, HTN, age x 1, DM, vascular disease): Tachycardia being driven by sepsis.  Ventricular rate seem to be improving however.  Short acting diltiazem was added for rate control: 30 mg every 8 hours.  This is in addition to Lopressor 25 mg every 6 hours.  With rate still in the 90s to 100s, would consolidate to diltiazem 240 mg (converted from amlodipine to diltiazem) and metoprolol 50 mg twice daily.  Would suspect that rates will improve as infection improves.  Continue Xarelto for anticoagulation.  Repeat magnesium to goal of 2.0, and potassium greater than 4  2. Sepsis secondary to cellulitis of the left lower extremity and streptococcus bacteremia:  On antibiotics per primary team  Plan is PICC line this morning with hope to be discharged.  Apparently he has a wedding to go to for his daughter.  3.  History of HFrEF -thought to be tachycardia mediated.  EF normalized on recent echo.  Euvolemic on exam.  Is on metoprolol when possible.  Relatively euvolemic.  No current need for diuretic.  4. Acute on CKD stage II: -Likely ATN in the setting of his hypotension and infection as above  Avoid digoxin.  IV fluid resuscitation completed.  Creatinine slightly increased today.  Defer to primary medicine team.  5. HTN: Stable blood pressures.  Amlodipine and benazepril currently held (would not restart amlodipine while on diltiazem)  continue beta-blocker and diltiazem.  As sepsis and renal function resolved, may be able to re-start ACE inhibitor -we will  need to defer to outpatient cardiologist..  6. HLD: Well-controlled as of April on Lipitor.  7. Chronic lower extremity DVT: On Xarelto; -Followed by [facility]  8. Hypomagnesemia: Repleted to goal 2.0   Plan prior to be even seen today was read to be discharged today with PICC line. [facility] will sign off.   Medication Recommendations: Consolidate metoprolol to 50 mg twice daily, diltiazem XT 240 mg.  Continue Xarelto. --DC amlodipine, continue to hold ACE inhibitor pending outpatient evaluation  Other recommendations (labs, testing, etc): Will need outpatient chemistry monitoring for renal function and potassium/magnesium Follow up as an outpatient: We will need to be seen by his outpatient Cardiologist (  Dr. Mariah Milling or APP) for adjustment of rate control. --We will have office contact him during the week to establish follow-up appointment.    For questions or updates, please contact [facility] Please consult [email or web address] for contact info under Cardiology/STEMI.    Signed, Bryan Lemma, M.D., M.S. Interventional Cardiologist   Pager # [phone] Phone # [phone] [address] [place], [place] [address]

## 2018-08-19 NOTE — Progress Notes (Signed)
Spoke with Jose Persia, RN concerning the placement of PICC. [facility] has already been contacted and are at bedside to place PICC.

## 2018-08-19 NOTE — Care Management Note (Signed)
Case Management Note  Patient Details  Name: Jeff [name] MRN: [id number] Date of Birth: 01-30-1951  Subjective/Objective:   Patient to be discharged per MD order. Orders in place for home health services. Patient to be discharged on IV abx. Patient agreeable to home health. Given choice, patient prefers [facility]. Referral placed with [name] from [facility] and he is agreement to accept for PT and RN services. ABX info provided to [name], OPAT orders and stop date all in place. No further needs. Tentative start of care set for tomorrow am. [name] RN BSN RNCM [phone]                   Action/Plan:   Expected Discharge Date:  08/19/18               Expected Discharge Plan:  Home w Home Health Services  In-House Referral:     Discharge planning Services  CM Consult  Post Acute Care Choice:  Home Health, Durable Medical Equipment Choice offered to:  Patient  DME Arranged:  IV pump/equipment DME Agency:  [facility]  HH Arranged:  RN, PT [facility] Agency:  [facility]  Status of Service:  Completed, signed off  If discussed at Long Length of Stay Meetings, dates discussed:    Additional Comments:  [name], RN 08/19/2018, 11:42 AM

## 2018-08-20 DIAGNOSIS — Z7982 Long term (current) use of aspirin: Secondary | ICD-10-CM | POA: Diagnosis not present

## 2018-08-20 DIAGNOSIS — Z452 Encounter for adjustment and management of vascular access device: Secondary | ICD-10-CM | POA: Diagnosis not present

## 2018-08-20 DIAGNOSIS — I82502 Chronic embolism and thrombosis of unspecified deep veins of left lower extremity: Secondary | ICD-10-CM | POA: Diagnosis not present

## 2018-08-20 DIAGNOSIS — Z7984 Long term (current) use of oral hypoglycemic drugs: Secondary | ICD-10-CM | POA: Diagnosis not present

## 2018-08-20 DIAGNOSIS — I110 Hypertensive heart disease with heart failure: Secondary | ICD-10-CM | POA: Diagnosis not present

## 2018-08-20 DIAGNOSIS — Z7901 Long term (current) use of anticoagulants: Secondary | ICD-10-CM | POA: Diagnosis not present

## 2018-08-20 DIAGNOSIS — L03116 Cellulitis of left lower limb: Secondary | ICD-10-CM | POA: Diagnosis not present

## 2018-08-20 DIAGNOSIS — A408 Other streptococcal sepsis: Secondary | ICD-10-CM | POA: Diagnosis not present

## 2018-08-20 DIAGNOSIS — I5022 Chronic systolic (congestive) heart failure: Secondary | ICD-10-CM | POA: Diagnosis not present

## 2018-08-20 DIAGNOSIS — E119 Type 2 diabetes mellitus without complications: Secondary | ICD-10-CM | POA: Diagnosis not present

## 2018-08-20 DIAGNOSIS — I4821 Permanent atrial fibrillation: Secondary | ICD-10-CM | POA: Diagnosis not present

## 2018-08-20 DIAGNOSIS — Z792 Long term (current) use of antibiotics: Secondary | ICD-10-CM | POA: Diagnosis not present

## 2018-08-20 DIAGNOSIS — E785 Hyperlipidemia, unspecified: Secondary | ICD-10-CM | POA: Diagnosis not present

## 2018-08-21 ENCOUNTER — Telehealth: Payer: Self-pay

## 2018-08-21 ENCOUNTER — Telehealth: Payer: Self-pay | Admitting: Family Medicine

## 2018-08-21 LAB — CULTURE, BLOOD (ROUTINE X 2): Culture: NO GROWTH

## 2018-08-21 NOTE — Telephone Encounter (Signed)
Verbally order given.

## 2018-08-21 NOTE — Telephone Encounter (Signed)
Go ahead and give verbal orders for the nursing care

## 2018-08-21 NOTE — Telephone Encounter (Signed)
Transition Care Management Follow-up Telephone Call  Date of discharge and from where: 08/19/2018 from [facility]  How have you been since you were released from the hospital? "I am so/so"  Any questions or concerns? No states [facility] came to the house yesterday.   Items Reviewed:  Did the pt receive and understand the discharge instructions provided? Yes   Medications obtained and verified? Yes   Any new allergies since your discharge? No   Dietary orders reviewed? Yes  Do you have support at home? Yes   Functional Questionnaire: (I = Independent and D = Dependent) ADLs:   Bathing/Dressing- I  Meal Prep- I  Eating- I  Maintaining continence- I  Transferring/Ambulation- I  Managing Meds- I  Follow up appointments reviewed:   PCP Hospital f/u appt confirmed? Yes  Sees Dr.Crissman on 08/08/2018 for office visit, patient was given option for hospital follow up with Dr.Crissman within 14 days, unable to make any of the appointments available. Discussed patient seeing Margit Hanks for hospital follow up and patient declined. He will call interm if needed  Specialist Hospital f/u appt confirmed? Yes  Sees [facility] on Wednesday 08/23/2018  Are transportation arrangements needed? No   If their condition worsens, is the pt aware to call PCP or go to the Emergency Dept.? Yes  Was the patient provided with contact information for the PCP's office or ED? Yes  Was to pt encouraged to call back with questions or concerns? Yes

## 2018-08-21 NOTE — Telephone Encounter (Signed)
Copied from CRM [id number]. Topic: Quick Communication - See Telephone Encounter >> Aug 21, 2018  9:24 AM Jay Schlichter wrote: CRM for notification. See Telephone encounter for: 08/21/18. [facility] requesting verbal orders  Nursing 1 week 2  Cb is [phone]

## 2018-08-22 ENCOUNTER — Telehealth: Payer: Self-pay | Admitting: *Deleted

## 2018-08-22 NOTE — Telephone Encounter (Signed)
Patient contacted regarding discharge from [facility] on 08/19/18.   Patient understands to follow up with provider ? On 10/04/18 at 3:20 pm at [facility].  Patient understands discharge instructions? Yes Patient understands medications and regiment? Yes  Patient understands to bring all medications to this visit? Yes

## 2018-08-22 NOTE — Telephone Encounter (Signed)
-----   Message from Antonieta Iba, MD sent at 08/21/2018 10:35 PM EDT ----- TCM and f/u TG  ----- Message ----- From: Marykay Lex, MD Sent: 08/20/2018  12:12 PM EDT To: Antonieta Iba, MD, Marilynne Halsted, RN  Hi, I am forwarding you the last progress note on Mr. Roblero. He was discharged home to go to his daughter's wedding.  We had adjusted his medications and he needs to have outpatient follow-up scheduled.   Bryan Lemma, MD

## 2018-08-23 DIAGNOSIS — Z7982 Long term (current) use of aspirin: Secondary | ICD-10-CM | POA: Diagnosis not present

## 2018-08-23 DIAGNOSIS — R918 Other nonspecific abnormal finding of lung field: Secondary | ICD-10-CM | POA: Diagnosis not present

## 2018-08-23 DIAGNOSIS — I871 Compression of vein: Secondary | ICD-10-CM | POA: Diagnosis not present

## 2018-08-23 DIAGNOSIS — R911 Solitary pulmonary nodule: Secondary | ICD-10-CM | POA: Diagnosis not present

## 2018-08-23 DIAGNOSIS — Z79899 Other long term (current) drug therapy: Secondary | ICD-10-CM | POA: Diagnosis not present

## 2018-08-23 DIAGNOSIS — I82502 Chronic embolism and thrombosis of unspecified deep veins of left lower extremity: Secondary | ICD-10-CM | POA: Diagnosis not present

## 2018-08-23 DIAGNOSIS — I872 Venous insufficiency (chronic) (peripheral): Secondary | ICD-10-CM | POA: Diagnosis not present

## 2018-08-23 DIAGNOSIS — Z7901 Long term (current) use of anticoagulants: Secondary | ICD-10-CM | POA: Diagnosis not present

## 2018-08-24 ENCOUNTER — Encounter: Payer: Self-pay | Admitting: Infectious Diseases

## 2018-08-24 ENCOUNTER — Inpatient Hospital Stay: Payer: Medicare HMO | Admitting: Infectious Diseases

## 2018-08-24 ENCOUNTER — Ambulatory Visit: Payer: Medicare HMO | Attending: Infectious Diseases | Admitting: Infectious Diseases

## 2018-08-24 VITALS — BP 148/98 | HR 88 | Temp 97.8°F | Wt 278.8 lb

## 2018-08-24 DIAGNOSIS — B954 Other streptococcus as the cause of diseases classified elsewhere: Secondary | ICD-10-CM | POA: Diagnosis not present

## 2018-08-24 DIAGNOSIS — Z87828 Personal history of other (healed) physical injury and trauma: Secondary | ICD-10-CM | POA: Diagnosis not present

## 2018-08-24 DIAGNOSIS — R7881 Bacteremia: Secondary | ICD-10-CM | POA: Diagnosis not present

## 2018-08-24 DIAGNOSIS — I4821 Permanent atrial fibrillation: Secondary | ICD-10-CM

## 2018-08-24 DIAGNOSIS — Z7901 Long term (current) use of anticoagulants: Secondary | ICD-10-CM

## 2018-08-24 DIAGNOSIS — Z7984 Long term (current) use of oral hypoglycemic drugs: Secondary | ICD-10-CM

## 2018-08-24 DIAGNOSIS — L03116 Cellulitis of left lower limb: Secondary | ICD-10-CM

## 2018-08-24 DIAGNOSIS — Z7982 Long term (current) use of aspirin: Secondary | ICD-10-CM | POA: Diagnosis not present

## 2018-08-24 DIAGNOSIS — Z792 Long term (current) use of antibiotics: Secondary | ICD-10-CM

## 2018-08-24 DIAGNOSIS — E119 Type 2 diabetes mellitus without complications: Secondary | ICD-10-CM | POA: Diagnosis not present

## 2018-08-24 DIAGNOSIS — Z95828 Presence of other vascular implants and grafts: Secondary | ICD-10-CM

## 2018-08-24 DIAGNOSIS — Z86718 Personal history of other venous thrombosis and embolism: Secondary | ICD-10-CM

## 2018-08-24 NOTE — Progress Notes (Signed)
NAME: Jeff [name]  DOB: 07/21/1951  MRN: [id number]  Date/Time: 08/24/2018 9:43 AM Subjective:  REASON FOR CONSULT: Hospital follow-up ? Jeff [name] is a 67 y.o. male with a history of DVT, A. fib, recent history of left leg cellulitis and group G Streptococcus bacteremia is here for follow-up.  Patient was recently admitted to [facility] between 10/9-10/10/2018 for left leg swelling with erythema and was diagnosed to have cellulitis of the left leg along with bacteremia.  He was treated with IV ceftriaxone and was discharged home on IV ceftriaxone to complete 7 days.  Patient has a history of left leg DVT and had one-time an IVC filter placed which was eventually removed.  And during this admission he had repeat ultrasound of the leg and that showed chronic thickening of the left femoral vein.  He is followed by interventional radiologist at [facility].  According to him they are planning to stent the femoral vein in the future. He is on Xarelto and aspirin for A. fib and DVT. Since his discharge he has been doing better.  The swelling of the leg is much improved.  He has no fever or chills.  He has no side effects from the antibiotic. Past Medical History:  Diagnosis Date  . Chronic deep vein thrombosis (DVT) (HCC)   . Chronic low back pain   . Chronic systolic heart failure (HCC)    a. TTE 2011 with EF 40-45% per notes  . Diabetes mellitus without complication (HCC)   . Erectile dysfunction   . HLD (hyperlipidemia)   . Hypertension   . Hypogonadism in male   . Permanent atrial fibrillation    a. s/p DCCV 2011; b. redeveloped Afib in 2016; c. CHADS2VASc at least 5 (CHF, HTN, age x 1, DM, vascular disease); d. on Xarelto    Past Surgical History:  Procedure Laterality Date  . COLONOSCOPY WITH PROPOFOL N/A 05/28/2016   Procedure: COLONOSCOPY WITH PROPOFOL;  Surgeon: Midge Minium, MD;  Location: [facility];  Service: Endoscopy;  Laterality: N/A;  diabetic - oral  meds  . DVT, leg Left   . INSERTION OF VENA CAVA FILTER  2014  . PERIPHERAL VASCULAR THROMBECTOMY Left 12/01/2017   Procedure: PERIPHERAL VASCULAR THROMBECTOMY;  Surgeon: Renford Dills, MD;  Location: [facility] INVASIVE CV LAB;  Service: Cardiovascular;  Laterality: Left;    Social history And non-smoker No alcohol Has a dog at home Family History  Problem Relation Age of Onset  . Lymphoma Mother   . Heart failure Father   . Diabetes Father   . Diabetes Mellitus II Unknown   . Heart attack Neg Hx   . Hypertension Neg Hx   . Cancer Neg Hx   . COPD Neg Hx   . Stroke Neg Hx   . Prostate cancer Neg Hx   . Kidney cancer Neg Hx   . Bladder Cancer Neg Hx    No Known Allergies  Current Outpatient Medications  Medication Sig Dispense Refill  . aspirin EC 325 MG tablet Take by mouth.    . cefTRIAXone 2 g in sodium chloride 0.9 % 100 mL Inject 2 g into the vein daily for 6 days. 12 g 0  . [START ON 08/26/2018] cephALEXin (KEFLEX) 500 MG capsule Take 1 capsule (500 mg total) by mouth 4 (four) times daily for 7 days. 28 capsule 0  . diltiazem (CARDIZEM CD) 120 MG 24 hr capsule Take 1 capsule (120 mg total) by mouth daily. 30  capsule 11  . fluticasone (FLONASE) 50 MCG/ACT nasal spray Place 2 sprays into both nostrils daily. 16 g 11  . glucose blood (CONTOUR NEXT TEST) test strip Use as instructed 100 each 12  . metFORMIN (GLUCOPHAGE) 1000 MG tablet Take 1 tablet (1,000 mg total) by mouth 2 (two) times daily with a meal. 180 tablet 4  . metoprolol tartrate (LOPRESSOR) 50 MG tablet Take 1 tablet (50 mg total) by mouth 2 (two) times daily. 180 tablet 4  . rivaroxaban (XARELTO) 20 MG TABS tablet Take 1 tablet (20 mg total) by mouth daily with supper. 30 tablet 11  . atorvastatin (LIPITOR) 10 MG tablet Take 1 tablet (10 mg total) by mouth daily. (Patient not taking: Reported on 08/24/2018) 90 tablet 4   No current facility-administered medications for this visit.      Abtx:  Anti-infectives  (From admission, onward)   None      REVIEW OF SYSTEMS:  Const: negative fever, negative chills, negative weight loss Eyes: negative diplopia or visual changes, negative eye pain ENT: negative coryza, negative sore throat Resp: negative cough, hemoptysis, dyspnea Cards: negative for chest pain, palpitations, lower extremity edema GU: negative for frequency, dysuria and hematuria GI: Negative for abdominal pain diarrhea, bleeding, constipation Skin: negative for rash and pruritus Heme: negative for easy bruising and gum/nose bleeding MS: negative for myalgias, arthralgias, back pain and muscle weakness Neurolo:negative for headaches, dizziness, vertigo, memory problems  Psych: negative for feelings of anxiety, depression  Endocrine: negative for thyroid, diabetes Allergy/Immunology- negative for any medication or food allergies ?  Objective:  VITALS:  BP (!) 148/98 (BP Location: Left Arm, Patient Position: Sitting, Cuff Size: Normal)   Pulse 88   Temp 97.8 F (36.6 C) (Oral)   Wt 278 lb 12.8 oz (126.5 kg)   BMI 38.88 kg/m  PHYSICAL EXAM:  General: Alert, cooperative, no distress, appears stated age.  Head: Normocephalic, without obvious abnormality, atraumatic. Eyes: Conjunctivae clear, anicteric sclerae. Pupils are equal ENT Nares normal. No drainage or sinus tenderness. Lips, mucosa, and tongue normal. No Thrush Neck: Supple, symmetrical, no adenopathy, thyroid: non tender no carotid bruit and no JVD. Back: No CVA tenderness. Lungs: Clear to auscultation bilaterally. No Wheezing or Rhonchi. No rales. Heart: Regular rate and rhythm, no murmur, rub or gallop. Abdomen: Soft, non-tender,not distended. Bowel sounds normal. No masses Extremities: Swelling left leg.  Some fading erythema with some duskiness Skin: No rashes or lesions. Or bruising Lymph: Cervical, supraclavicular normal. Neurologic: Grossly non-focal Right PIC site has some bruising.  But no tenderness or  erythema  IMAGING RESULTS: ?None Impression/Recommendation ?66 y.o. male with a history of DVT, A. fib, recent history of left leg cellulitis and group G Streptococcus bacteremia is here for follow-up.  Patient was recently admitted to [facility] between 10/9-10/10/2018 for left leg swelling with erythema and was diagnosed to have cellulitis of the left leg along with bacteremia.  He was treated with IV ceftriaxone and was discharged home on IV ceftriaxone to complete 7 days.  ? ?Left leg cellulitis.  He is at risk because of chronic DVT of the left femoral vein.  Also had injury to the left foot when he was a child.  Cellulitis is much improved.  Asked him to keep his leg elevated when sitting.  To wear compression stockings.  To moisturize that leg.  To do some active exercises to the ankles.  Group G streptococcal bacteremia secondary to the above.  Currently on IV ceftriaxone  which she will be finishing on Saturday.  He will then take a couple of days of oral Keflex.  Left leg DVT.  Used to have an IVC filter which was removed.  Since he has chronic DVT of the left femoral vein interventional radiologist at [facility] is planning to stent the vein to reduce the edema.  A. fib on Xarelto and aspirin.  Diabetes mellitus on metformin ___________________________________________________ Discussed with patient in detail

## 2018-08-24 NOTE — Patient Instructions (Addendum)
You are here as follow up for cellulitis of the left leg with Group G streptococcus bacteremia- ou will be completing Iv ceftriaxone on 08/26/18. Keep the leg elevated when you are sitting, wear compression stockings and moisturize the leg. I understand you will see IR at [facility] to get stenting of the left femoral vein in the future. Your PICC line will be removed after you complete IV antibiotic.you will take 2 more days of PO antibiotic

## 2018-08-26 DIAGNOSIS — L03116 Cellulitis of left lower limb: Secondary | ICD-10-CM | POA: Diagnosis not present

## 2018-08-26 DIAGNOSIS — Z792 Long term (current) use of antibiotics: Secondary | ICD-10-CM | POA: Diagnosis not present

## 2018-08-26 DIAGNOSIS — E119 Type 2 diabetes mellitus without complications: Secondary | ICD-10-CM | POA: Diagnosis not present

## 2018-08-26 DIAGNOSIS — I82502 Chronic embolism and thrombosis of unspecified deep veins of left lower extremity: Secondary | ICD-10-CM | POA: Diagnosis not present

## 2018-08-26 DIAGNOSIS — I110 Hypertensive heart disease with heart failure: Secondary | ICD-10-CM | POA: Diagnosis not present

## 2018-08-26 DIAGNOSIS — I5022 Chronic systolic (congestive) heart failure: Secondary | ICD-10-CM | POA: Diagnosis not present

## 2018-08-26 DIAGNOSIS — E785 Hyperlipidemia, unspecified: Secondary | ICD-10-CM | POA: Diagnosis not present

## 2018-08-26 DIAGNOSIS — Z452 Encounter for adjustment and management of vascular access device: Secondary | ICD-10-CM | POA: Diagnosis not present

## 2018-08-26 DIAGNOSIS — I4821 Permanent atrial fibrillation: Secondary | ICD-10-CM | POA: Diagnosis not present

## 2018-08-26 DIAGNOSIS — A408 Other streptococcal sepsis: Secondary | ICD-10-CM | POA: Diagnosis not present

## 2018-08-31 ENCOUNTER — Ambulatory Visit: Payer: Medicare HMO | Admitting: Family Medicine

## 2018-09-01 DIAGNOSIS — Z7902 Long term (current) use of antithrombotics/antiplatelets: Secondary | ICD-10-CM | POA: Diagnosis not present

## 2018-09-01 DIAGNOSIS — Z86718 Personal history of other venous thrombosis and embolism: Secondary | ICD-10-CM | POA: Diagnosis not present

## 2018-09-01 DIAGNOSIS — Z7982 Long term (current) use of aspirin: Secondary | ICD-10-CM | POA: Diagnosis not present

## 2018-09-01 DIAGNOSIS — I82220 Acute embolism and thrombosis of inferior vena cava: Secondary | ICD-10-CM | POA: Diagnosis not present

## 2018-09-01 DIAGNOSIS — T82858A Stenosis of vascular prosthetic devices, implants and grafts, initial encounter: Secondary | ICD-10-CM | POA: Diagnosis not present

## 2018-09-01 DIAGNOSIS — I871 Compression of vein: Secondary | ICD-10-CM | POA: Diagnosis not present

## 2018-09-01 DIAGNOSIS — Y831 Surgical operation with implant of artificial internal device as the cause of abnormal reaction of the patient, or of later complication, without mention of misadventure at the time of the procedure: Secondary | ICD-10-CM | POA: Diagnosis not present

## 2018-09-01 DIAGNOSIS — I82431 Acute embolism and thrombosis of right popliteal vein: Secondary | ICD-10-CM | POA: Diagnosis not present

## 2018-09-07 ENCOUNTER — Ambulatory Visit (INDEPENDENT_AMBULATORY_CARE_PROVIDER_SITE_OTHER): Payer: Medicare HMO | Admitting: Family Medicine

## 2018-09-07 ENCOUNTER — Encounter: Payer: Self-pay | Admitting: Family Medicine

## 2018-09-07 VITALS — BP 153/93 | HR 84 | Temp 98.4°F | Ht 71.0 in | Wt 276.0 lb

## 2018-09-07 DIAGNOSIS — E782 Mixed hyperlipidemia: Secondary | ICD-10-CM | POA: Diagnosis not present

## 2018-09-07 DIAGNOSIS — E119 Type 2 diabetes mellitus without complications: Secondary | ICD-10-CM

## 2018-09-07 DIAGNOSIS — D582 Other hemoglobinopathies: Secondary | ICD-10-CM | POA: Diagnosis not present

## 2018-09-07 DIAGNOSIS — A419 Sepsis, unspecified organism: Secondary | ICD-10-CM | POA: Diagnosis not present

## 2018-09-07 DIAGNOSIS — I10 Essential (primary) hypertension: Secondary | ICD-10-CM

## 2018-09-07 MED ORDER — ATORVASTATIN CALCIUM 20 MG PO TABS: 10.0000 mg | ORAL_TABLET | Freq: Every day | ORAL | 2 refills | Status: DC

## 2018-09-07 MED ORDER — BENAZEPRIL HCL 40 MG PO TABS: 40.0000 mg | ORAL_TABLET | Freq: Every day | ORAL | 2 refills | Status: DC

## 2018-09-07 NOTE — Assessment & Plan Note (Signed)
The current medical regimen is effective;  continue present plan and medications.  

## 2018-09-07 NOTE — Assessment & Plan Note (Signed)
Followed by [facility]. 

## 2018-09-07 NOTE — Assessment & Plan Note (Signed)
Discuss LDL cholesterol of 81 with goal less than 70 will increase atorvastatin from 10 mg to 20 mg with cautions because of slight elevation of liver functions. Will recheck liver and cholesterol 1 month.

## 2018-09-07 NOTE — Assessment & Plan Note (Signed)
Discuss wt loss 

## 2018-09-07 NOTE — Assessment & Plan Note (Signed)
Poor control of blood pressure will increase benazepril from 20 mg to 40 mg recheck 1 month.

## 2018-09-07 NOTE — Progress Notes (Signed)
BP (!) 153/93   Pulse 84   Temp 98.4 F (36.9 C) (Oral)   Ht 5\' 11"  (1.803 m)   Wt 276 lb (125.2 kg)   SpO2 99%   BMI 38.49 kg/m    Subjective:    Patient ID: Jeff [name], male    DOB: 05-11-1951, 67 y.o.   MRN: [id number]  HPI: Jeff [name] is a 67 y.o. male  Chief Complaint  Patient presents with  . Follow-up  . Diabetes  . Hypertension  . Hyperlipidemia  . Hospitalization Follow-up    Was recently at [facility] for stent replacement after becoming septic  Patient follow-up had a very stormy fall and year altogether working on blood clots especially in his left leg and has had stents placed in his aorta.  Patient is recovering from this getting his last of his Lovenox injections be switching over to Xarelto later this week. Legs are doing somewhat better.  Still has redness and swelling especially of his left leg but has support hose prescribed and wears them when upright.  Props his legs when possible.  Patient follow-up blood pressure home blood pressure readings in the 140s over 90s.  Patient taking Benzapril 20 mg and metoprolol 50 mg twice a day, diltiazem 120, without problems. Diabetes patient is trying to lose weight and is on keto diet but had very stormy fall with multiple hospitalizations. Cholesterol taking atorvastatin 10 mg without problems.   Relevant past medical, surgical, family and social history reviewed and updated as indicated. Interim medical history since our last visit reviewed. Allergies and medications reviewed and updated.  Review of Systems  Constitutional: Negative.   Respiratory: Negative.   Cardiovascular: Negative.     Per HPI unless specifically indicated above     Objective:    BP (!) 153/93   Pulse 84   Temp 98.4 F (36.9 C) (Oral)   Ht 5\' 11"  (1.803 m)   Wt 276 lb (125.2 kg)   SpO2 99%   BMI 38.49 kg/m   Wt Readings from Last 3 Encounters:  09/07/18 276 lb (125.2 kg)  08/24/18 278 lb 12.8 oz (126.5 kg)  08/19/18 281  lb 3.2 oz (127.6 kg)    Physical Exam  Constitutional: He is oriented to person, place, and time. He appears well-developed and well-nourished.  HENT:  Head: Normocephalic and atraumatic.  Eyes: Conjunctivae and EOM are normal.  Neck: Normal range of motion.  Cardiovascular: Normal rate, regular rhythm and normal heart sounds.  Pulmonary/Chest: Effort normal and breath sounds normal.  Musculoskeletal: Normal range of motion.  Neurological: He is alert and oriented to person, place, and time.  Skin: No erythema.  Psychiatric: He has a normal mood and affect. His behavior is normal. Judgment and thought content normal.        Assessment & Plan:   Problem List Items Addressed This Visit      Cardiovascular and Mediastinum   HYPERTENSION, BENIGN - Primary    Poor control of blood pressure will increase benazepril from 20 mg to 40 mg recheck 1 month.      Relevant Medications   benazepril (LOTENSIN) 20 MG tablet   enoxaparin (LOVENOX) 100 MG/ML injection   Other Relevant Orders   Basic metabolic panel   Bayer DCA Hb W0J Waived   LP+ALT+AST Piccolo, Waived   Microalbumin, Urine Waived     Endocrine   Non-insulin dependent type 2 diabetes mellitus (HCC)    The current medical regimen is effective;  continue present plan and medications.       Relevant Medications   benazepril (LOTENSIN) 20 MG tablet   Other Relevant Orders   Basic metabolic panel   Bayer DCA Hb Z6X Waived   LP+ALT+AST Piccolo, Waived   Microalbumin, Urine Waived     Other   Hyperlipidemia    Discuss LDL cholesterol of 81 with goal less than 70 will increase atorvastatin from 10 mg to 20 mg with cautions because of slight elevation of liver functions. Will recheck liver and cholesterol 1 month.      Relevant Medications   benazepril (LOTENSIN) 20 MG tablet   enoxaparin (LOVENOX) 100 MG/ML injection   Other Relevant Orders   Basic metabolic panel   Bayer DCA Hb W9U Waived   LP+ALT+AST Piccolo,  Waived   Microalbumin, Urine Waived   Morbid obesity (HCC)    Discuss wt loss      Elevated hemoglobin (HCC)    Followed by [facility]      RESOLVED: Sepsis (HCC)       Follow up plan: Return in about 4 weeks (around 10/05/2018) for BMP,  Lipids, ALT, AST.

## 2018-09-08 LAB — BASIC METABOLIC PANEL
BUN / CREAT RATIO: 14 (ref 10–24)
BUN: 20 mg/dL (ref 8–27)
CO2: 21 mmol/L (ref 20–29)
CREATININE: 1.4 mg/dL — AB (ref 0.76–1.27)
Calcium: 8.7 mg/dL (ref 8.6–10.2)
Chloride: 102 mmol/L (ref 96–106)
GFR calc Af Amer: 60 mL/min/{1.73_m2} (ref >59)
GFR, EST NON AFRICAN AMERICAN: 52 mL/min/{1.73_m2} — AB (ref >59)
GLUCOSE: 160 mg/dL — AB (ref 65–99)
POTASSIUM: 4.7 mmol/L (ref 3.5–5.2)
Sodium: 139 mmol/L (ref 134–144)

## 2018-09-08 LAB — LP+ALT+AST PICCOLO, WAIVED
ALT (SGPT) Piccolo, Waived: 50 U/L — ABNORMAL HIGH (ref 10–47)
AST (SGOT) PICCOLO, WAIVED: 48 U/L — AB (ref 11–38)
CHOL/HDL RATIO PICCOLO,WAIVE: 4.3 mg/dL
CHOLESTEROL PICCOLO, WAIVED: 130 mg/dL (ref <200)
HDL Chol Piccolo, Waived: 30 mg/dL — ABNORMAL LOW (ref >59)
LDL Chol Calc Piccolo Waived: 81 mg/dL (ref <100)
TRIGLYCERIDES PICCOLO,WAIVED: 95 mg/dL (ref <150)
VLDL Chol Calc Piccolo,Waive: 19 mg/dL (ref <30)

## 2018-09-08 LAB — MICROALBUMIN, URINE WAIVED
CREATININE, URINE WAIVED: 200 mg/dL (ref 10–300)
Microalb, Ur Waived: 150 mg/L — ABNORMAL HIGH (ref 0–19)

## 2018-09-08 LAB — BAYER DCA HB A1C WAIVED: HB A1C (BAYER DCA - WAIVED): 6.9 % (ref <7.0)

## 2018-09-11 ENCOUNTER — Encounter: Payer: Self-pay | Admitting: Family Medicine

## 2018-09-13 ENCOUNTER — Encounter: Payer: Self-pay | Admitting: Family Medicine

## 2018-09-13 ENCOUNTER — Encounter

## 2018-09-13 ENCOUNTER — Ambulatory Visit (INDEPENDENT_AMBULATORY_CARE_PROVIDER_SITE_OTHER): Payer: Medicare HMO | Admitting: Family Medicine

## 2018-09-13 VITALS — BP 133/85 | HR 96 | Temp 98.0°F | Ht 71.0 in | Wt 270.0 lb

## 2018-09-13 DIAGNOSIS — Z23 Encounter for immunization: Secondary | ICD-10-CM | POA: Diagnosis not present

## 2018-09-13 DIAGNOSIS — J019 Acute sinusitis, unspecified: Secondary | ICD-10-CM

## 2018-09-13 MED ORDER — HYDROCOD POLST-CPM POLST ER 10-8 MG/5ML PO SUER: 2.5000 mL | Freq: Two times a day (BID) | ORAL | 0 refills | Status: DC | PRN

## 2018-09-13 MED ORDER — AMOXICILLIN 875 MG PO TABS: 875.0000 mg | ORAL_TABLET | Freq: Two times a day (BID) | ORAL | 0 refills | Status: DC

## 2018-09-13 NOTE — Progress Notes (Signed)
There were no vitals taken for this visit.   Subjective:    Patient ID: Jeff [name], male    DOB: October 02, 1951, 67 y.o.   MRN: [id number]  HPI: Jeff [name] is a 67 y.o. male  Sinusitis Patient with complaints of getting worse especially over the last 2 days with marked sinus pressure congestion drainage cough.  Patient's had systemic symptoms of fever chills and achiness.  No known tick exposure. Patient has not had any fevers today and if possible wants to get a flu shot today. Has not taken any over-the-counter medications yet.    Relevant past medical, surgical, family and social history reviewed and updated as indicated. Interim medical history since our last visit reviewed. Allergies and medications reviewed and updated.  Review of Systems  Constitutional: Negative.   HENT: Positive for congestion, ear pain, postnasal drip, rhinorrhea, sinus pressure, sinus pain, sneezing and sore throat.   Respiratory: Positive for cough. Negative for apnea and chest tightness.   Cardiovascular: Negative.     Per HPI unless specifically indicated above     Objective:    There were no vitals taken for this visit.  Wt Readings from Last 3 Encounters:  09/07/18 276 lb (125.2 kg)  08/24/18 278 lb 12.8 oz (126.5 kg)  08/19/18 281 lb 3.2 oz (127.6 kg)    Physical Exam  Constitutional: He is oriented to person, place, and time. He appears well-developed and well-nourished.  HENT:  Head: Normocephalic and atraumatic.  Right Ear: External ear normal.  Left Ear: External ear normal.  Mouth/Throat: Oropharyngeal exudate present.  Eyes: Conjunctivae and EOM are normal.  Neck: Normal range of motion.  Cardiovascular: Normal rate, regular rhythm and normal heart sounds.  Pulmonary/Chest: Effort normal and breath sounds normal.  Musculoskeletal: Normal range of motion.  Neurological: He is alert and oriented to person, place, and time.  Skin: No erythema.  Psychiatric: He has a  normal mood and affect. His behavior is normal. Judgment and thought content normal.    Results for orders placed or performed in visit on 09/07/18  Basic metabolic panel  Result Value Ref Range   Glucose 160 (H) 65 - 99 mg/dL   BUN 20 8 - 27 mg/dL   Creatinine, Ser 4.09 (H) 0.76 - 1.27 mg/dL   GFR calc non Af Amer 52 (L) >59 mL/min/1.73   GFR calc Af Amer 60 >59 mL/min/1.73   BUN/Creatinine Ratio 14 10 - 24   Sodium 139 134 - 144 mmol/L   Potassium 4.7 3.5 - 5.2 mmol/L   Chloride 102 96 - 106 mmol/L   CO2 21 20 - 29 mmol/L   Calcium 8.7 8.6 - 10.2 mg/dL  Bayer DCA Hb W1X Waived  Result Value Ref Range   HB A1C (BAYER DCA - WAIVED) 6.9 <7.0 %  LP+ALT+AST Piccolo, Waived  Result Value Ref Range   ALT (SGPT) Piccolo, Waived 50 (H) 10 - 47 U/L   AST (SGOT) Piccolo, Waived 48 (H) 11 - 38 U/L   Cholesterol Piccolo, Waived 130 <200 mg/dL   HDL Chol Piccolo, Waived 30 (L) >59 mg/dL   Triglycerides Piccolo,Waived 95 <150 mg/dL   Chol/HDL Ratio Piccolo,Waive 4.3 mg/dL   LDL Chol Calc Piccolo Waived 81 <100 mg/dL   VLDL Chol Calc Piccolo,Waive 19 <30 mg/dL  Microalbumin, Urine Waived  Result Value Ref Range   Microalb, Ur Waived 150 (H) 0 - 19 mg/L   Creatinine, Urine Waived 200 10 - 300 mg/dL  Microalb/Creat Ratio 30-300 (H) <30 mg/g      Assessment & Plan:   Problem List Items Addressed This Visit    None    Visit Diagnoses    Need for influenza vaccination    -  Primary   Relevant Orders   Flu vaccine HIGH DOSE PF (Fluzone High dose)   Acute sinusitis, recurrence not specified, unspecified location       Relevant Medications   amoxicillin (AMOXIL) 875 MG tablet   chlorpheniramine-HYDROcodone (TUSSIONEX PENNKINETIC ER) 10-8 MG/5ML SUER    Discussed sinusitis care and treatment use of Tussionex and cautions patient only will use at night and not drive.  Discussed over-the-counter medications. Also okay to get flu shot today.  Follow up plan: Return if symptoms worsen or  fail to improve, for As scheduled.

## 2018-09-18 ENCOUNTER — Telehealth: Payer: Self-pay

## 2018-09-18 ENCOUNTER — Encounter: Payer: Self-pay | Admitting: Family Medicine

## 2018-09-18 NOTE — Telephone Encounter (Signed)
Copied from CRM [id number]. Topic: General - Other >> Sep 18, 2018  8:38 AM Gean Birchwood R wrote: Patient is calling in stating he needs a return to work note with no restrictions . He states he can come pick up . Please call when note is ready [phone]   Routing to provider. Please advise.

## 2018-09-18 NOTE — Telephone Encounter (Signed)
Letter written. OK for him to pick up

## 2018-09-18 NOTE — Telephone Encounter (Signed)
Pt.notified

## 2018-10-04 ENCOUNTER — Ambulatory Visit: Payer: Medicare HMO | Admitting: Family Medicine

## 2018-10-04 ENCOUNTER — Ambulatory Visit: Payer: Medicare HMO | Admitting: Cardiovascular Disease

## 2018-10-04 ENCOUNTER — Encounter

## 2018-11-03 ENCOUNTER — Encounter: Payer: Self-pay | Admitting: Family Medicine

## 2018-11-03 ENCOUNTER — Ambulatory Visit (INDEPENDENT_AMBULATORY_CARE_PROVIDER_SITE_OTHER): Payer: Medicare HMO | Admitting: Family Medicine

## 2018-11-03 VITALS — BP 152/92 | HR 83 | Temp 98.0°F | Wt 285.1 lb

## 2018-11-03 DIAGNOSIS — J329 Chronic sinusitis, unspecified: Secondary | ICD-10-CM | POA: Diagnosis not present

## 2018-11-03 MED ORDER — HYDROCOD POLST-CPM POLST ER 10-8 MG/5ML PO SUER: 2.5000 mL | Freq: Two times a day (BID) | ORAL | 0 refills | Status: DC | PRN

## 2018-11-03 MED ORDER — AZITHROMYCIN 250 MG PO TABS: ORAL_TABLET | ORAL | 0 refills | Status: DC

## 2018-11-03 NOTE — Progress Notes (Signed)
BP (!) 152/92   Pulse 83   Temp 98 F (36.7 C) (Oral)   Wt 285 lb 1.6 oz (129.3 kg)   SpO2 99%   BMI 39.76 kg/m    Subjective:    Patient ID: Jeff [name], male    DOB: 10-05-1951, 67 y.o.   MRN: [id number]  HPI: Jeff [name] is a 67 y.o. male  Chief Complaint  Patient presents with  . URI    pt states he has had a cough, congestion, sinus pressure, and yellow phlegm for about 3 to 4 days    Cough, chest congestion, facial pressure, SOB, nasal drainage, generalized fatigue x 2 days. Denies fevers, chills, sweats, body aches. Long hx of recurrent sinusitis and allergic rhinitis, followed by ENT and on flonase and singulair daily.   Relevant past medical, surgical, family and social history reviewed and updated as indicated. Interim medical history since our last visit reviewed. Allergies and medications reviewed and updated.  Review of Systems  Per HPI unless specifically indicated above     Objective:    BP (!) 152/92   Pulse 83   Temp 98 F (36.7 C) (Oral)   Wt 285 lb 1.6 oz (129.3 kg)   SpO2 99%   BMI 39.76 kg/m   Wt Readings from Last 3 Encounters:  11/03/18 285 lb 1.6 oz (129.3 kg)  09/13/18 270 lb (122.5 kg)  09/07/18 276 lb (125.2 kg)    Physical Exam Vitals signs and nursing note reviewed.  Constitutional:      Appearance: Normal appearance.  HENT:     Head: Atraumatic.     Right Ear: Tympanic membrane normal.     Left Ear: Tympanic membrane normal.     Nose: Rhinorrhea present.     Mouth/Throat:     Mouth: Mucous membranes are moist.     Pharynx: Posterior oropharyngeal erythema present.  Eyes:     Extraocular Movements: Extraocular movements intact.     Conjunctiva/sclera: Conjunctivae normal.     Pupils: Pupils are equal, round, and reactive to light.  Neck:     Musculoskeletal: Normal range of motion and neck supple.  Cardiovascular:     Rate and Rhythm: Normal rate.  Pulmonary:     Effort: Pulmonary effort is normal.     Breath  sounds: Normal breath sounds.  Musculoskeletal: Normal range of motion.  Skin:    General: Skin is warm and dry.     Findings: No rash.  Neurological:     Mental Status: He is alert and oriented to person, place, and time.  Psychiatric:        Mood and Affect: Mood normal.        Thought Content: Thought content normal.        Judgment: Judgment normal.     Results for orders placed or performed in visit on 09/07/18  Basic metabolic panel  Result Value Ref Range   Glucose 160 (H) 65 - 99 mg/dL   BUN 20 8 - 27 mg/dL   Creatinine, Ser 4.091.40 (H) 0.76 - 1.27 mg/dL   GFR calc non Af Amer 52 (L) >59 mL/min/1.73   GFR calc Af Amer 60 >59 mL/min/1.73   BUN/Creatinine Ratio 14 10 - 24   Sodium 139 134 - 144 mmol/L   Potassium 4.7 3.5 - 5.2 mmol/L   Chloride 102 96 - 106 mmol/L   CO2 21 20 - 29 mmol/L   Calcium 8.7 8.6 - 10.2 mg/dL  [employer]  DCA Hb A1c Waived  Result Value Ref Range   HB A1C (BAYER DCA - WAIVED) 6.9 <7.0 %  LP+ALT+AST Piccolo, Waived  Result Value Ref Range   ALT (SGPT) Piccolo, Waived 50 (H) 10 - 47 U/L   AST (SGOT) Piccolo, Waived 48 (H) 11 - 38 U/L   Cholesterol Piccolo, Waived 130 <200 mg/dL   HDL Chol Piccolo, Waived 30 (L) >59 mg/dL   Triglycerides Piccolo,Waived 95 <150 mg/dL   Chol/HDL Ratio Piccolo,Waive 4.3 mg/dL   LDL Chol Calc Piccolo Waived 81 <100 mg/dL   VLDL Chol Calc Piccolo,Waive 19 <30 mg/dL  Microalbumin, Urine Waived  Result Value Ref Range   Microalb, Ur Waived 150 (H) 0 - 19 mg/L   Creatinine, Urine Waived 200 10 - 300 mg/dL   Microalb/Creat Ratio 30-300 (H) <30 mg/g      Assessment & Plan:   Problem List Items Addressed This Visit      Respiratory   Chronic sinusitis - Primary    Supportive care reviewed, will start zpak in next few days if not improving given long hx of recurrent sinus infections. Follow up with ENT if no improvement. Continue good allergy regimen daily      Relevant Medications   azithromycin (ZITHROMAX) 250 MG  tablet   chlorpheniramine-HYDROcodone (TUSSIONEX PENNKINETIC ER) 10-8 MG/5ML SUER       Follow up plan: Return if symptoms worsen or fail to improve.

## 2018-11-03 NOTE — Assessment & Plan Note (Signed)
Supportive care reviewed, will start zpak in next few days if not improving given long hx of recurrent sinus infections. Follow up with ENT if no improvement. Continue good allergy regimen daily

## 2018-11-06 ENCOUNTER — Other Ambulatory Visit: Payer: Self-pay | Admitting: Family Medicine

## 2018-11-07 NOTE — Telephone Encounter (Signed)
We do not write this for him, he will need to call the prescriber ([facility])

## 2018-11-07 NOTE — Telephone Encounter (Signed)
Requested medication (s) are due for refill today: no  Requested medication (s) are on the active medication list: no  Last refill:  09/27/18  Future visit scheduled: no  Notes to clinic:  Not on current med list; previously written by historical provider     Requested Prescriptions  Pending Prescriptions Disp Refills   clopidogrel (PLAVIX) 75 MG tablet [Pharmacy Med Name: CLOPIDOGREL 75 MG TABLET] 30 tablet 1    Sig: TAKE 1 TABLET BY MOUTH DAILY     Hematology: Antiplatelets - clopidogrel Failed - 11/06/2018 10:48 AM      Failed - Evaluate AST, ALT within 2 months of therapy initiation.      Failed - ALT in normal range and within 360 days    ALT (SGPT) Piccolo, Waived  Date Value Ref Range Status  09/07/2018 50 (H) 10 - 47 U/L Final         Passed - AST in normal range and within 360 days    AST (SGOT) Piccolo, Waived  Date Value Ref Range Status  09/07/2018 48 (H) 11 - 38 U/L Final         Passed - HCT in normal range and within 180 days    HCT  Date Value Ref Range Status  08/17/2018 47.0 39.0 - 52.0 % Final   Hematocrit  Date Value Ref Range Status  02/23/2018 46.2 37.5 - 51.0 % Final         Passed - HGB in normal range and within 180 days    Hemoglobin  Date Value Ref Range Status  08/17/2018 15.4 13.0 - 17.0 g/dL Final  09/81/191404/18/2019 78.214.6 13.0 - 17.7 g/dL Final         Passed - PLT in normal range and within 180 days    Platelets  Date Value Ref Range Status  08/17/2018 164 150 - 400 K/uL Final  02/23/2018 246 150 - 379 x10E3/uL Final         Passed - Valid encounter within last 6 months    Recent Outpatient Visits          4 days ago [facility], unspecified [facility] [place]   1 month ago [facility] for influenza [facility]ge Gainer[facility] A, [place]   2 months ago HYPERTENSION, BENIGN   [facility], Mark A, MD   5 months ago Non-insulin dependent type 2  diabetes mellitus [facility](HCC)   [facility] [place], Redge GainerMark A, MD   8 months ago Advanced care planning/counseling discussion   [facility] Redge GainerMark A, [facility]      Future Appointments            In 3 months [facility]

## 2018-11-14 ENCOUNTER — Other Ambulatory Visit: Payer: Self-pay | Admitting: Family Medicine

## 2018-11-14 NOTE — Telephone Encounter (Signed)
Pt is on a different dosage of Benzepril HCL.

## 2018-11-16 DIAGNOSIS — Z86718 Personal history of other venous thrombosis and embolism: Secondary | ICD-10-CM | POA: Diagnosis not present

## 2018-11-16 DIAGNOSIS — I871 Compression of vein: Secondary | ICD-10-CM | POA: Diagnosis not present

## 2018-11-16 DIAGNOSIS — Z95828 Presence of other vascular implants and grafts: Secondary | ICD-10-CM | POA: Diagnosis not present

## 2018-11-16 DIAGNOSIS — I824Z3 Acute embolism and thrombosis of unspecified deep veins of distal lower extremity, bilateral: Secondary | ICD-10-CM | POA: Diagnosis not present

## 2018-11-16 DIAGNOSIS — R911 Solitary pulmonary nodule: Secondary | ICD-10-CM | POA: Diagnosis not present

## 2018-11-21 ENCOUNTER — Other Ambulatory Visit: Payer: Self-pay

## 2018-11-21 NOTE — Telephone Encounter (Signed)
Last visit for acute with Fleet Contrasachel 11/03/2018. Last visit with Dr. Dossie Arbourrissman 10/04/2018.  Routing to PCP.

## 2018-11-22 MED ORDER — BENAZEPRIL HCL 40 MG PO TABS: 40.0000 mg | ORAL_TABLET | Freq: Every day | ORAL | 2 refills | Status: DC

## 2018-12-01 ENCOUNTER — Other Ambulatory Visit: Payer: Self-pay | Admitting: Cardiovascular Disease

## 2018-12-01 NOTE — Telephone Encounter (Signed)
Please review for refill, Thanks !  

## 2018-12-27 ENCOUNTER — Other Ambulatory Visit: Payer: Self-pay | Admitting: Family Medicine

## 2018-12-27 DIAGNOSIS — E782 Mixed hyperlipidemia: Secondary | ICD-10-CM

## 2018-12-27 NOTE — Telephone Encounter (Signed)
Requested Prescriptions  Pending Prescriptions Disp Refills  . atorvastatin (LIPITOR) 20 MG tablet [Pharmacy Med Name: ATORVASTATIN 20 MG TABLET] 45 tablet 0    Sig: TAKE 1/2 TABLET BY MOUTH DAILY     Cardiovascular:  Antilipid - Statins Failed - 12/27/2018  3:28 AM      Failed - HDL in normal range and within 360 days    HDL  Date Value Ref Range Status  02/23/2018 39 (L) >39 mg/dL Final         Passed - Total Cholesterol in normal range and within 360 days    Cholesterol Piccolo, Waived  Date Value Ref Range Status  09/07/2018 130 <200 mg/dL Final    Comment:                            Desirable                <200                         Borderline High      200- 239                         High                     >239          Passed - LDL in normal range and within 360 days    LDL Calculated  Date Value Ref Range Status  02/23/2018 49 0 - 99 mg/dL Final         Passed - Triglycerides in normal range and within 360 days    Triglyceride fasting, serum  Date Value Ref Range Status  02/17/2010 111 mg/dL    Triglycerides Piccolo,Waived  Date Value Ref Range Status  09/07/2018 95 <150 mg/dL Final    Comment:                            Normal                   <150                         Borderline High     150 - 199                         High                200 - 499                         Very High                >499          Passed - Patient is not pregnant      Passed - Valid encounter within last 12 months    Recent Outpatient Visits          1 month ago Chronic sinusitis, unspecified location   [facility], [place], [place]   3 months ago Need for influenza vaccination   [facility] Dossie Arbour, Redge Gainer, MD   3 months ago HYPERTENSION, BENIGN   [facility] Crissman, Redge Gainer, MD   7 months  ago Non-insulin dependent type 2 diabetes mellitus (HCC)   [facility] Crissman, Redge Gainer, MD   10 months  ago Advanced care planning/counseling discussion   [facility] Crissman, Redge Gainer, MD      Future Appointments            In 2 months [facility], [facility]

## 2019-01-04 DIAGNOSIS — J320 Chronic maxillary sinusitis: Secondary | ICD-10-CM | POA: Diagnosis not present

## 2019-01-04 DIAGNOSIS — J019 Acute sinusitis, unspecified: Secondary | ICD-10-CM | POA: Diagnosis not present

## 2019-01-06 ENCOUNTER — Other Ambulatory Visit: Payer: Self-pay

## 2019-01-06 ENCOUNTER — Encounter: Payer: Self-pay | Admitting: Emergency Medicine

## 2019-01-06 ENCOUNTER — Emergency Department: Payer: Medicare HMO

## 2019-01-06 ENCOUNTER — Emergency Department
Admission: EM | Admit: 2019-01-06 | Discharge: 2019-01-06 | Disposition: A | Discharge status: Home / Self Care | Payer: Medicare HMO | Attending: Emergency Medicine | Admitting: Emergency Medicine

## 2019-01-06 DIAGNOSIS — I4891 Unspecified atrial fibrillation: Secondary | ICD-10-CM | POA: Diagnosis not present

## 2019-01-06 DIAGNOSIS — Z7984 Long term (current) use of oral hypoglycemic drugs: Secondary | ICD-10-CM | POA: Insufficient documentation

## 2019-01-06 DIAGNOSIS — I10 Essential (primary) hypertension: Secondary | ICD-10-CM | POA: Diagnosis not present

## 2019-01-06 DIAGNOSIS — R079 Chest pain, unspecified: Secondary | ICD-10-CM | POA: Diagnosis not present

## 2019-01-06 DIAGNOSIS — Z7901 Long term (current) use of anticoagulants: Secondary | ICD-10-CM | POA: Diagnosis not present

## 2019-01-06 DIAGNOSIS — Z79899 Other long term (current) drug therapy: Secondary | ICD-10-CM | POA: Insufficient documentation

## 2019-01-06 DIAGNOSIS — R0789 Other chest pain: Secondary | ICD-10-CM | POA: Diagnosis present

## 2019-01-06 DIAGNOSIS — I5022 Chronic systolic (congestive) heart failure: Secondary | ICD-10-CM | POA: Diagnosis not present

## 2019-01-06 DIAGNOSIS — Z7982 Long term (current) use of aspirin: Secondary | ICD-10-CM | POA: Insufficient documentation

## 2019-01-06 DIAGNOSIS — I110 Hypertensive heart disease with heart failure: Secondary | ICD-10-CM | POA: Insufficient documentation

## 2019-01-06 DIAGNOSIS — E119 Type 2 diabetes mellitus without complications: Secondary | ICD-10-CM | POA: Diagnosis not present

## 2019-01-06 LAB — CBC WITH DIFFERENTIAL/PLATELET
Abs Immature Granulocytes: 0.04 10*3/uL (ref 0.00–0.07)
Basophils Absolute: 0 10*3/uL (ref 0.0–0.1)
Basophils Relative: 1 %
Eosinophils Absolute: 0.1 10*3/uL (ref 0.0–0.5)
Eosinophils Relative: 2 %
HCT: 45.4 % (ref 39.0–52.0)
Hemoglobin: 15 g/dL (ref 13.0–17.0)
Immature Granulocytes: 1 %
Lymphocytes Relative: 8 %
Lymphs Abs: 0.6 10*3/uL — ABNORMAL LOW (ref 0.7–4.0)
MCH: 29 pg (ref 26.0–34.0)
MCHC: 33 g/dL (ref 30.0–36.0)
MCV: 87.8 fL (ref 80.0–100.0)
Monocytes Absolute: 0.5 10*3/uL (ref 0.1–1.0)
Monocytes Relative: 7 %
Neutro Abs: 6.1 10*3/uL (ref 1.7–7.7)
Neutrophils Relative %: 81 %
Platelets: 179 10*3/uL (ref 150–400)
RBC: 5.17 MIL/uL (ref 4.22–5.81)
RDW: 13.2 % (ref 11.5–15.5)
WBC: 7.4 10*3/uL (ref 4.0–10.5)
nRBC: 0 % (ref 0.0–0.2)

## 2019-01-06 LAB — COMPREHENSIVE METABOLIC PANEL
ALK PHOS: 55 U/L (ref 38–126)
ALT: 12 U/L (ref 0–44)
AST: 15 U/L (ref 15–41)
Albumin: 3.6 g/dL (ref 3.5–5.0)
Anion gap: 9 (ref 5–15)
BUN: 19 mg/dL (ref 8–23)
CALCIUM: 8.4 mg/dL — AB (ref 8.9–10.3)
CO2: 26 mmol/L (ref 22–32)
Chloride: 98 mmol/L (ref 98–111)
Creatinine, Ser: 1.18 mg/dL (ref 0.61–1.24)
GFR calc Af Amer: >60 mL/min (ref >60)
GFR calc non Af Amer: >60 mL/min (ref >60)
Glucose, Bld: 189 mg/dL — ABNORMAL HIGH (ref 70–99)
Potassium: 4.4 mmol/L (ref 3.5–5.1)
Sodium: 133 mmol/L — ABNORMAL LOW (ref 135–145)
Total Bilirubin: 1.3 mg/dL — ABNORMAL HIGH (ref 0.3–1.2)
Total Protein: 6.7 g/dL (ref 6.5–8.1)

## 2019-01-06 LAB — TROPONIN I
Troponin I: <0.03 ng/mL (ref <0.03)
Troponin I: <0.03 ng/mL (ref <0.03)

## 2019-01-06 NOTE — ED Triage Notes (Signed)
Pt to ed with c/o chest pain that started about 430 am. States pain has been intermittent since.  Pt does report mild weakness and diaphoresis associated with the pain. Denies sob, denies n/v.

## 2019-01-06 NOTE — ED Provider Notes (Signed)
[facility] Emergency Department Provider Note   ____________________________________________   First MD Initiated Contact with Patient 01/06/19 [phone]     (approximate)  I have reviewed the triage vital signs and the nursing notes.   HISTORY  Chief Complaint Chest Pain    HPI Jeff Braxlee Mirchandani. is a 68 y.o. male who comes in reporting about an hour of chest tightness and warmth associated with some clammy feeling.  It came on today lasted about an hour and went away spontaneously.  Happened while he was sitting in his recliner after he took his Mucinex and Zyrtec.  He thinks maybe it is a reaction to both of the medicines as he is never taken them together.  He is never had this before.  He did not have any nausea or shortness of breath with it.  He does not have any trouble with exercise.  He does have multiple other risk factors however see the past medical history below.   Past Medical History:  Diagnosis Date  . Chronic deep vein thrombosis (DVT) (HCC)   . Chronic low back pain   . Chronic systolic heart failure (HCC)    a. TTE 2011 with EF 40-45% per notes  . Diabetes mellitus without complication (HCC)   . Erectile dysfunction   . HLD (hyperlipidemia)   . Hypertension   . Hypogonadism in male   . Permanent atrial fibrillation    a. s/p DCCV 2011; b. redeveloped Afib in 2016; c. CHADS2VASc at least 5 (CHF, HTN, age x 1, DM, vascular disease); d. on Xarelto    Patient Active Problem List   Diagnosis Date Noted  . Elevated hemoglobin (HCC) 09/07/2018  . Inferior vena caval stenosis 05/22/2018  . 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 Morbid obesity (HCC) 01/04/2018  . Chronic sinusitis 12/29/2017  . History of DVT (deep vein thrombosis) 12/17/2017  . Acute deep vein thrombosis (DVT) of femoral vein of left lower extremity (HCC) 11/28/2017  . BPH (benign prostatic hyperplasia) 12/20/2016  . Advanced care planning/counseling discussion   .  Erectile dysfunction 03/29/2015  . Hypogonadism in male 03/29/2015  . Hyperlipidemia 02/02/2013  . Non-insulin dependent type 2 diabetes mellitus (HCC) 02/02/2013  . DVT of lower limb, acute (HCC) 11/08/2012  . EDEMA 06/03/2010  . HYPERTENSION, BENIGN 02/23/2010  . Congestive dilated cardiomyopathy (HCC) 02/23/2010  . A-fib (HCC) 02/20/2010    Past Surgical History:  Procedure Laterality Date  . COLONOSCOPY WITH PROPOFOL N/A 05/28/2016   Procedure: COLONOSCOPY WITH PROPOFOL;  Surgeon: Midge Minium, MD;  Location[facility];  Service: Endoscopy;  Laterality: N/A;  diabetic - oral meds  . DVT, leg Left   . INSERTION OF VENA CAVA FILTER  2014  . PERIPHERAL VASCULAR THROMBECTOMY Left 12/01/2017   Procedure: PERIPHERAL VASCULAR THROMBECTOMY;  Surgeon: Renford Dills, MD;  Location: [facility] INVASIVE CV LAB;  Service: Cardiovascular;  Laterality: Left;    Prior to Admission medications   Medication Sig Start Date End Date Taking? Authorizing Provider  aspirin 325 MG tablet Take 325 mg by mouth daily.   Yes [provider]  atorvastatin (LIPITOR) 20 MG tablet TAKE 1/2 TABLET BY MOUTH DAILY 12/27/18  Yes Crissman, Redge Gainer, MD  benazepril (LOTENSIN) 40 MG tablet Take 1 tablet (40 mg total) by mouth daily. 11/22/18  Yes Crissman, Redge Gainer, MD  chlorpheniramine-HYDROcodone (TUSSIONEX PENNKINETIC ER) 10-8 MG/5ML SUER Take 2.5-5 mLs by mouth every 12 (twelve) hours as needed for cough. 11/03/18  Yes  Particia NearingLane, Rachel Elizabeth, PA-C  diltiazem (CARDIZEM CD) 120 MG 24 hr capsule Take 1 capsule (120 mg total) by mouth daily. 08/19/18 08/19/19 Yes Adrian SaranMody, Sital, MD  glucose blood (CONTOUR NEXT TEST) test strip Use as instructed 12/12/17  Yes Crissman, Redge GainerMark A, MD  metFORMIN (GLUCOPHAGE) 1000 MG tablet Take 1 tablet (1,000 mg total) by mouth 2 (two) times daily with a meal. 03/02/18  Yes Crissman, Redge GainerMark A, MD  metoprolol tartrate (LOPRESSOR) 50 MG tablet Take 1 tablet (50 mg total) by mouth 2 (two) times  daily. 03/02/18  Yes Crissman, Redge GainerMark A, MD  montelukast (SINGULAIR) 10 MG tablet Take 10 mg by mouth daily.  08/13/18  Yes [provider]  rivaroxaban (XARELTO) 20 MG TABS tablet Take 1 tablet (20 mg total) by mouth daily with supper. Need appt w/ Dr. Mariah MillingGollan for further refills. Patient taking differently: Take 20 mg by mouth daily. Need appt w/ Dr. Mariah MillingGollan for further refills. 12/04/18  Yes Antonieta IbaGollan, Timothy J, MD    Allergies Patient has no known allergies.  Family History  Problem Relation Age of Onset  . Lymphoma Mother   . Heart failure Father   . Diabetes Father   . Diabetes Mellitus II Other   . Heart attack Neg Hx   . Hypertension Neg Hx   . Cancer Neg Hx   . COPD Neg Hx   . Stroke Neg Hx   . Prostate cancer Neg Hx   . Kidney cancer Neg Hx   . Bladder Cancer Neg Hx     Social History Social History   Tobacco Use  . Smoking status: Never Smoker  . Smokeless tobacco: Never Used  Substance Use Topics  . Alcohol use: No    Alcohol/week: 0.0 standard drinks  . Drug use: No    Review of Systems  Constitutional: No fever/chills Eyes: No visual changes. ENT: No sore throat. Cardiovascular: Denies chest pain currently. Respiratory: Denies shortness of breath. Gastrointestinal: No abdominal pain.  No nausea, no vomiting.  No diarrhea.  No constipation. Genitourinary: Negative for dysuria. Musculoskeletal: Negative for back pain. Skin: Negative for rash. Neurological: Negative for headaches, focal weakness  ____________________________________________   PHYSICAL EXAM:  VITAL SIGNS: ED Triage Vitals  Enc Vitals Group     BP 01/06/19 0754 (!) 185/93     Pulse Rate 01/06/19 0754 86     Resp 01/06/19 0754 18     Temp 01/06/19 0754 97.7 F (36.5 C)     Temp Source 01/06/19 0754 Oral     SpO2 01/06/19 0754 99 %     Weight 01/06/19 0755 285 lb (129.3 kg)     Height 01/06/19 0755 5\' 10"  (1.778 m)     Head Circumference --      Peak Flow --      Pain Score  01/06/19 0755 0     Pain Loc --      Pain Edu? --      Excl. in GC? --     Constitutional: Alert and oriented. Well appearing and in no acute distress. Eyes: Conjunctivae are normal.  Head: Atraumatic. Nose: No congestion/rhinnorhea. Mouth/Throat: Mucous membranes are moist.  Oropharynx non-erythematous. Neck: No stridor. Cardiovascular: Normal rate, regular rhythm. Grossly normal heart sounds.  Good peripheral circulation. Respiratory: Normal respiratory effort.  No retractions. Lungs CTAB. Gastrointestinal: Soft and nontender. No distention. No abdominal bruits. No CVA tenderness. Musculoskeletal: No lower extremity tenderness nor edema.  Neurologic:  Normal speech and language. No gross focal neurologic  deficits are appreciated.  Skin:  Skin is warm, dry and intact. No rash noted. Psychiatric: Mood and affect are normal. Speech and behavior are normal.  ____________________________________________   LABS (all labs ordered are listed, but only abnormal results are displayed)  Labs Reviewed  COMPREHENSIVE METABOLIC PANEL - Abnormal; Notable for the following components:      Result Value   Sodium 133 (*)    Glucose, Bld 189 (*)    Calcium 8.4 (*)    Total Bilirubin 1.3 (*)    All other components within normal limits  CBC WITH DIFFERENTIAL/PLATELET - Abnormal; Notable for the following components:   Lymphs Abs 0.6 (*)    All other components within normal limits  TROPONIN I  TROPONIN I   ____________________________________________  EKG  EKG read interpreted by me shows atrial fibrillation at a rate of 81 no acute ST-T wave changes EKG looks similar to old ones ____________________________________________  RADIOLOGY  ED MD interpretation: Chest x-ray read by radiology reviewed by me shows no acute pathology  Official radiology report(s): Dg Chest Portable 1 View  Result Date: 01/06/2019 CLINICAL DATA:  Chest pain and discomfort for several hours EXAM: PORTABLE  CHEST 1 VIEW COMPARISON:  08/09/2018 FINDINGS: Cardiac shadow is mildly enlarged but stable. Aortic calcifications are again seen. Previously seen right-sided PICC line has been removed in the interval. No focal infiltrate or sizable effusion is seen. No bony abnormality is noted. IMPRESSION: No acute abnormality seen. Electronically Signed   By: Alcide Clever M.D.   On: 01/06/2019 08:28    ____________________________________________   PROCEDURES  Procedure(s) performed (including Critical Care):  Procedures   ____________________________________________   INITIAL IMPRESSION / ASSESSMENT AND PLAN / ED COURSE  Patient's troponins are negative.  His EKG looks similar to old ones chest x-ray is showing no acute pathology.  He is not having any pain now has not had any before and this was not with exertion.  He has no trouble with exertion.  I will let him go.  He will follow-up with audiology.          ____________________________________________   FINAL CLINICAL IMPRESSION(S) / ED DIAGNOSES  Final diagnoses:  Nonspecific chest pain     ED Discharge Orders    None       Note:  This document was prepared using Dragon voice recognition software and may include unintentional dictation errors.    Arnaldo Natal, MD 01/06/19 1158

## 2019-01-06 NOTE — Discharge Instructions (Addendum)
The tests we have done here today are negative.  I would like you to follow-up with cardiology just to be sure.  Please give Dr. Windell Hummingbird office a call Monday morning.  This should be oh to get you worked in very quickly.  Please return here if you get worse at all.  Especially for increasing pain shortness of breath or fever.

## 2019-01-18 DIAGNOSIS — J301 Allergic rhinitis due to pollen: Secondary | ICD-10-CM | POA: Diagnosis not present

## 2019-01-18 DIAGNOSIS — J320 Chronic maxillary sinusitis: Secondary | ICD-10-CM | POA: Diagnosis not present

## 2019-01-18 DIAGNOSIS — R05 Cough: Secondary | ICD-10-CM | POA: Diagnosis not present

## 2019-02-12 ENCOUNTER — Telehealth: Payer: Self-pay

## 2019-02-12 NOTE — Telephone Encounter (Signed)
Virtual Visit Pre-Appointment Phone Call  Steps For Call:  1. Confirm consent - "In the setting of the current Covid19 crisis, you are scheduled for a TELEPHONE visit with your provider on 03/08/2019 at 9:00AM.  Just as we do with many in-office visits, in order for you to participate in this visit, we must obtain consent.  If you'd like, I can send this to your mychart (if signed up) or email for you to review.  Otherwise, I can obtain your verbal consent now.  All virtual visits are billed to your insurance company just like a normal visit would be.  By agreeing to a virtual visit, we'd like you to understand that the technology does not allow for your provider to perform an examination, and thus may limit your provider's ability to fully assess your condition.  Finally, though the technology is pretty good, we cannot assure that it will always work on either your or our end, and in the setting of a video visit, we may have to convert it to a phone-only visit.  In either situation, we cannot ensure that we have a secure connection.  Are you willing to proceed?"  2. Give patient instructions for WebEx download to smartphone as below if video visit  3. Advise patient to be prepared with any vital sign or heart rhythm information, their current medicines, and a piece of paper and pen handy for any instructions they may receive the day of their visit  4. Inform patient they will receive a phone call 15 minutes prior to their appointment time (may be from unknown caller ID) so they should be prepared to answer  5. Confirm that appointment type is correct in Epic appointment notes (video vs telephone)    TELEPHONE CALL NOTE  Jeff [name]. has been deemed a candidate for a follow-up tele-health visit to limit community exposure during the Covid-19 pandemic. I spoke with the patient via phone to ensure availability of phone/video source, confirm preferred email & phone number, and discuss  instructions and expectations.  I reminded Jeff [name]. to be prepared with any vital sign and/or heart rhythm information that could potentially be obtained via home monitoring, at the time of his visit. I reminded Jeff [name]. to expect a phone call at the time of his visit if his visit.  Did the patient verbally acknowledge consent to treatment? YES  Margrett Rud, [place] 02/12/2019 4:36 PM   DOWNLOADING THE WEBEX SOFTWARE TO SMARTPHONE  - If Apple, go to [employer] and type in WebEx in the search bar. Download [employer], the blue/green circle. The app is free but as with any other app downloads, their phone may require them to verify saved payment information or Apple password. The patient does NOT have to create an account.  - If Android, ask patient to go to [employer] and type in WebEx in the search bar. Download [employer], the blue/green circle. The app is free but as with any other app downloads, their phone may require them to verify saved payment information or Android password. The patient does NOT have to create an account.   CONSENT FOR TELE-HEALTH VISIT - PLEASE REVIEW  I hereby voluntarily request, consent and authorize [facility] and its employed or contracted physicians, physician assistants, nurse practitioners or other licensed health care professionals (the Practitioner), to provide me with telemedicine health care services (the "Services") as deemed necessary by the treating  Practitioner. I acknowledge and consent to receive the Services by the Practitioner via telemedicine. I understand that the telemedicine visit will involve communicating with the Practitioner through live audiovisual communication technology and the disclosure of certain medical information by electronic transmission. I acknowledge that I have been given the opportunity to request an in-person assessment or other available alternative prior to the telemedicine  visit and am voluntarily participating in the telemedicine visit.  I understand that I have the right to withhold or withdraw my consent to the use of telemedicine in the course of my care at any time, without affecting my right to future care or treatment, and that the Practitioner or I may terminate the telemedicine visit at any time. I understand that I have the right to inspect all information obtained and/or recorded in the course of the telemedicine visit and may receive copies of available information for a reasonable fee.  I understand that some of the potential risks of receiving the Services via telemedicine include:  Marland Kitchen Delay or interruption in medical evaluation due to technological equipment failure or disruption; . Information transmitted may not be sufficient (e.g. poor resolution of images) to allow for appropriate medical decision making by the Practitioner; and/or  . In rare instances, security protocols could fail, causing a breach of personal health information.  Furthermore, I acknowledge that it is my responsibility to provide information about my medical history, conditions and care that is complete and accurate to the best of my ability. I acknowledge that Practitioner's advice, recommendations, and/or decision may be based on factors not within their control, such as incomplete or inaccurate data provided by me or distortions of diagnostic images or specimens that may result from electronic transmissions. I understand that the practice of medicine is not an exact science and that Practitioner makes no warranties or guarantees regarding treatment outcomes. I acknowledge that I will receive a copy of this consent concurrently upon execution via email to the email address I last provided but may also request a printed copy by calling the office of [facility].    I understand that my insurance will be billed for this visit.   I have read or had this consent read to me. . I  understand the contents of this consent, which adequately explains the benefits and risks of the Services being provided via telemedicine.  . I have been provided ample opportunity to ask questions regarding this consent and the Services and have had my questions answered to my satisfaction. . I give my informed consent for the services to be provided through the use of telemedicine in my medical care  By participating in this telemedicine visit I agree to the above.

## 2019-02-18 ENCOUNTER — Other Ambulatory Visit: Payer: Self-pay | Admitting: Family Medicine

## 2019-02-18 DIAGNOSIS — E1159 Type 2 diabetes mellitus with other circulatory complications: Secondary | ICD-10-CM

## 2019-02-22 ENCOUNTER — Telehealth: Payer: Self-pay

## 2019-02-22 NOTE — Telephone Encounter (Signed)
Patient scheduled for an AWV on 03/01/2019 with [facility], Due to Covid-19 pandemic this is unable to be done in office, called patient to see if they are able to do this virtually or if it needed to rescheduled for an in office for after June 2020. Patient preferred to reschedule. Rescheduled to June, patient verbalixed

## 2019-02-24 ENCOUNTER — Other Ambulatory Visit: Payer: Self-pay | Admitting: Family Medicine

## 2019-02-25 ENCOUNTER — Other Ambulatory Visit: Payer: Self-pay | Admitting: Cardiovascular Disease

## 2019-02-26 NOTE — Telephone Encounter (Signed)
Please review for refill.  

## 2019-02-26 NOTE — Telephone Encounter (Signed)
Pt's wt 129.3 kg, age 68, serum creatinine 1.18, CrCl 109.58.

## 2019-03-01 ENCOUNTER — Ambulatory Visit: Payer: Medicare HMO

## 2019-03-07 NOTE — Progress Notes (Signed)
Virtual Visit via Telephone Note   This visit type was conducted due to national recommendations for restrictions regarding the COVID-19 Pandemic (e.g. social distancing) in an effort to limit this patient's exposure and mitigate transmission in our community.  Due to his co-morbid illnesses, this patient is at least at moderate risk for complications without adequate follow up.  This format is felt to be most appropriate for this patient at this time.  The patient did not have access to video technology/had technical difficulties with video requiring transitioning to audio format only (telephone).  All issues noted in this document were discussed and addressed.  No physical exam could be performed with this format.  Please refer to the patient's chart for his  consent to telehealth for [facility].   I connected with  Jeff [name]. on 03/08/19 by a video enabled telemedicine application and verified that I am speaking with the correct person using two identifiers. I discussed the limitations of evaluation and management by telemedicine. The patient expressed understanding and agreed to proceed.   Evaluation Performed:  Follow-up visit  Date:  03/08/2019   ID:  Jeff [name]., DOB February 03, 1951, MRN [id number]  Patient Location:  [address] [place] [address]   Provider location:   [facility], [place] office  PCP:  Steele Sizer, MD  Cardiologist:  Fonnie Mu   Chief Complaint: Leg swelling    History of Present Illness:    Jeff [name]. is a 68 y.o. male who presents via audio/video conferencing for a telehealth visit today.   The patient does not symptoms concerning for COVID-19 infection (fever, chills, cough, or new SHORTNESS OF BREATH).   Patient has a past medical history of DM2  hemoglobin A1c 7.7 DVT LLE 2010, with filter hyperlipidemia   April 2011  atrial fibrillation with RVR,  emergency room on June 8 2011for rapid  atrial fibrillation,  s/p cardioversion  lower extremity edema On calcium channel blockers,  Echo in 02/2010 shows EF 40-45%,  possibly tachycardia induced  Converted back to atrial fibrillation in 2016, at that time did not want cardioversion (CHF, HTN, age x 1, DM, vascular disease):  he presents for routine followup for chronic atrial fibrillation, and extensive DVT  Chest pain in ER 12/2018 Hospital records reviewed with the patient in detail SOB, and coughing Took Mucinex and Zyrtec.  He thinks maybe it is a reaction to both of the medicines  chest tightness and warmth associated with some clammy feeling. Had pressure 185/93 Pulse 127  Saw Dr. 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 Active but no regular exercise program Uses prosthesis on his leg  Total cholesterol 130 Mildly elevated AST ALT LDL 81 up from 56  Hemoglobin A1c 6.9 down from the 9 range several years ago   Other past medical history reviewed On prior clinic visit was not taking xarelto for his atrial fibrillation developed severe sinusitis/URI , weight loss over 2 weeks, bedrest  Became tremendously weak requiring hospitalization  early January 2019 Diagnosed with dehydration, renal failure, syncope, left lower extremity DVT Syncope in the setting of not eating well, weakness home  Started on heparin infusion for DVT left lower extremity  thrombus was occlusive left SFA down to popliteal   IVC  filter in place from 2010  As outpatient seen by Dr. Lorretta Harp, "too much clot" Recommended lymphedema compression pumps  Referred to [facility] for thrombectomy Reports that he had thrombectomy procedure , took hours 5 pm to 10 pm No anesthesia, very painful  He has had follow-up lower extremity CT scan [facility] Confirming residual B/l  extensive clot, iliacs extending downward   Other past medical history reviewed Echo in 02/2010 shows EF 40-45% which was felt to be tachycardia induced.   Prior CV studies:   The following studies were reviewed today:    Past Medical History:  Diagnosis Date  . Chronic deep vein thrombosis (DVT) (HCC)   . Chronic low back pain   . Chronic systolic heart failure (HCC)    a. TTE 2011 with EF 40-45% per notes  . Diabetes mellitus without complication (HCC)   . Erectile dysfunction   . HLD (hyperlipidemia)   . Hypertension   . Hypogonadism in male   . Permanent atrial fibrillation    a. s/p DCCV 2011; b. redeveloped Afib in 2016; c. CHADS2VASc at least 5 (CHF, HTN, age x 1, DM, vascular disease); d. on Xarelto   Past Surgical History:  Procedure Laterality Date  . COLONOSCOPY WITH PROPOFOL N/A 05/28/2016   Procedure: COLONOSCOPY WITH PROPOFOL;  Surgeon: Midge Minium, MD;  Location: [facility];  Service: Endoscopy;  Laterality: N/A;  diabetic - oral meds  . DVT, leg Left   . INSERTION OF VENA CAVA FILTER  2014  . PERIPHERAL VASCULAR THROMBECTOMY Left 12/01/2017   Procedure: PERIPHERAL VASCULAR THROMBECTOMY;  Surgeon: Renford Dills, MD;  Location: [facility] INV[facility];  Service: Cardiovascular;  Laterality: Left;     No outpatient medications have been marked as taking for the 03/08/19 encounter (Appointment) with Antonieta Iba, MD.     Allergies:   Patient has no known allergies.   Social History   Tobacco Use  . Smoking status: Never Smoker  . Smokeless tobacco: Never Used  Substance Use Topics  . Alcohol use: No    Alcohol/week: 0.0 standard drinks  . Drug use: No     Current Outpatient Medications on File Prior to Visit  Medication Sig Dispense Refill  . aspirin 325 MG tablet Take 325 mg by mouth daily.    Marland Kitchen atorvastatin (LIPITOR) 20 MG tablet TAKE 1/2 TABLET BY MOUTH DAILY 45 tablet 0  . benazepril (LOTENSIN) 40 MG tablet TAKE 1 TABLET BY MOUTH  EVERY DAY 30 tablet 2  . chlorpheniramine-HYDROcodone (TUSSIONEX PENNKINETIC ER) 10-8 MG/5ML SUER Take 2.5-5 mLs by mouth every 12 (twelve) hours as needed for cough. 70 mL 0  . diltiazem (CARDIZEM CD) 120 MG 24 hr capsule Take 1 capsule (120 mg total) by mouth daily. 30 capsule 11  . glucose blood (CONTOUR NEXT TEST) test strip Use as instructed 100 each 12  . metFORMIN (GLUCOPHAGE) 1000 MG tablet TAKE 2 TABLETS BY MOUTH EVERY MORNING AND 2 TABS EVERY EVENING 360 tablet 4  . metoprolol tartrate (LOPRESSOR) 50 MG tablet Take 1 tablet (50 mg total) by mouth 2 (two) times daily. 180 tablet 4  . montelukast (SINGULAIR) 10 MG tablet Take 10 mg by mouth daily.   11  . rivaroxaban (XARELTO) 20 MG TABS tablet Take 1 tablet (20 mg total) by mouth daily. 90 tablet 0   No current facility-administered medications on file prior to visit.      Family Hx: The patient's family history includes Diabetes in his father; Diabetes  Mellitus II in an other family member; Heart failure in his father; Lymphoma in his mother. There is no history of Heart attack, Hypertension, Cancer, COPD, Stroke, Prostate cancer, Kidney cancer, or Bladder Cancer.  ROS:   Please see the history of present illness.    Review of Systems  Constitutional: Negative.   Respiratory: Negative.   Cardiovascular: Negative.   Gastrointestinal: Negative.   Musculoskeletal: Negative.   Neurological: Negative.   Psychiatric/Behavioral: Negative.   All other systems reviewed and are negative.    Labs/Other Tests and Data Reviewed:    Recent Labs: 08/17/2018: Magnesium 1.5; TSH 1.387 01/06/2019: ALT 12; BUN 19; Creatinine, Ser 1.18; Hemoglobin 15.0; Platelets 179; Potassium 4.4; Sodium 133   Recent Lipid Panel Lab Results  Component Value Date/Time   CHOL 130 09/07/2018 08:22 AM   TRIG 95 09/07/2018 08:22 AM   TRIG 111 02/17/2010   HDL 39 (L) 02/23/2018 10:32 AM   CHOLHDL 3.6 12/20/2016 04:20 PM   LDLCALC 49 02/23/2018 10:32 AM     Wt Readings from Last 3 Encounters:  01/06/19 285 lb (129.3 kg)  11/03/18 285 lb 1.6 oz (129.3 kg)  09/13/18 270 lb (122.5 kg)     Exam:    Vital Signs: Vital signs may also be detailed in the HPI There were no vitals taken for this visit.  Wt Readings from Last 3 Encounters:  01/06/19 285 lb (129.3 kg)  11/03/18 285 lb 1.6 oz (129.3 kg)  09/13/18 270 lb (122.5 kg)   Temp Readings from Last 3 Encounters:  01/06/19 97.7 F (36.5 C) (Oral)  11/03/18 98 F (36.7 C) (Oral)  09/13/18 98 F (36.7 C) (Oral)   BP Readings from Last 3 Encounters:  01/06/19 (!) 142/90  11/03/18 (!) 152/92  09/13/18 133/85   Pulse Readings from Last 3 Encounters:  01/06/19 83  11/03/18 83  09/13/18 96    BP: 130/80 Pulse rate: 80 Resp: 16  Well nourished, well developed male in no acute distress. Constitutional:  oriented to person, place, and time. No distress.     ASSESSMENT & PLAN:    Atrial fibrillation, unspecified type (HCC) Permanent atrial fibrillation, he will start monitoring his rate more closely Was previously discharged on diltiazem 240 daily when he was in the hospital October 2020 That was in the setting of sepsis, likely had elevated rate in the setting of infection/cellulitis Reports now he is only taking diltiazem 120 daily and rate is adequate He will stay on Xarelto permanently  Congestive dilated cardiomyopathy (HCC) Ejection fraction up to 50 to 55% No medication changes made Reports he feels euvolemic  Type 2 diabetes mellitus with other circulatory complication, without long-term current use of insulin (HCC) We have encouraged continued exercise, careful diet management in an effort to lose weight.  Most recent hemoglobin A1c down to 6.9 previously and 7.8 range  HYPERTENSION, BENIGN Blood pressure typically 130/80, he will continue to monitor pressures  Mixed hyperlipidemia Numbers at goal We will watch LFTs  Acute renal failure, unspecified acute  renal failure type (HCC) Long history of diabetes, creatinine stable 1.4    COVID-19 Education: The signs and symptoms of COVID-19 were discussed with the patient and how to seek care for testing (follow up with PCP or arrange E-visit).  The importance of social distancing was discussed today.  Patient Risk:   After full review of this patients clinical status, I feel that they are at least moderate risk at this time.  Time:   Today,  I have spent 25 minutes with the patient with telehealth technology discussing the cardiac and medical problems/diagnoses detailed above   10 min spent reviewing the chart prior to patient visit today   Medication Adjustments/Labs and Tests Ordered: Current medicines are reviewed at length with the patient today.  Concerns regarding medicines are outlined above.   Tests Ordered: No tests ordered   Medication Changes: No changes made   Disposition: Follow-up in 6 months   Signed, Julien Nordmannimothy Gollan, MD  03/08/2019 9:21 AM    [facility] [place] Office [address], [place], [place] [id number]

## 2019-03-08 ENCOUNTER — Other Ambulatory Visit: Payer: Self-pay

## 2019-03-08 ENCOUNTER — Telehealth (INDEPENDENT_AMBULATORY_CARE_PROVIDER_SITE_OTHER): Payer: Medicare HMO | Admitting: Cardiovascular Disease

## 2019-03-08 DIAGNOSIS — R600 Localized edema: Secondary | ICD-10-CM | POA: Diagnosis not present

## 2019-03-08 DIAGNOSIS — I420 Dilated cardiomyopathy: Secondary | ICD-10-CM

## 2019-03-08 DIAGNOSIS — I4891 Unspecified atrial fibrillation: Secondary | ICD-10-CM | POA: Diagnosis not present

## 2019-03-08 DIAGNOSIS — I10 Essential (primary) hypertension: Secondary | ICD-10-CM | POA: Diagnosis not present

## 2019-03-08 DIAGNOSIS — I82403 Acute embolism and thrombosis of unspecified deep veins of lower extremity, bilateral: Secondary | ICD-10-CM

## 2019-03-08 DIAGNOSIS — I82412 Acute embolism and thrombosis of left femoral vein: Secondary | ICD-10-CM

## 2019-03-08 DIAGNOSIS — E1159 Type 2 diabetes mellitus with other circulatory complications: Secondary | ICD-10-CM

## 2019-03-08 DIAGNOSIS — E782 Mixed hyperlipidemia: Secondary | ICD-10-CM

## 2019-03-08 DIAGNOSIS — N179 Acute kidney failure, unspecified: Secondary | ICD-10-CM

## 2019-03-08 NOTE — Patient Instructions (Signed)

## 2019-03-18 ENCOUNTER — Other Ambulatory Visit: Payer: Self-pay | Admitting: Family Medicine

## 2019-03-21 ENCOUNTER — Other Ambulatory Visit: Payer: Self-pay | Admitting: Family Medicine

## 2019-03-21 DIAGNOSIS — E782 Mixed hyperlipidemia: Secondary | ICD-10-CM

## 2019-04-04 DIAGNOSIS — Z7901 Long term (current) use of anticoagulants: Secondary | ICD-10-CM | POA: Diagnosis not present

## 2019-04-04 DIAGNOSIS — J343 Hypertrophy of nasal turbinates: Secondary | ICD-10-CM | POA: Insufficient documentation

## 2019-04-04 DIAGNOSIS — Z86718 Personal history of other venous thrombosis and embolism: Secondary | ICD-10-CM | POA: Diagnosis not present

## 2019-04-04 DIAGNOSIS — E119 Type 2 diabetes mellitus without complications: Secondary | ICD-10-CM | POA: Diagnosis not present

## 2019-04-04 DIAGNOSIS — J3489 Other specified disorders of nose and nasal sinuses: Secondary | ICD-10-CM | POA: Diagnosis not present

## 2019-04-04 DIAGNOSIS — J329 Chronic sinusitis, unspecified: Secondary | ICD-10-CM | POA: Diagnosis not present

## 2019-04-04 DIAGNOSIS — J310 Chronic rhinitis: Secondary | ICD-10-CM | POA: Diagnosis not present

## 2019-04-04 DIAGNOSIS — Z8679 Personal history of other diseases of the circulatory system: Secondary | ICD-10-CM | POA: Diagnosis not present

## 2019-04-04 DIAGNOSIS — I4891 Unspecified atrial fibrillation: Secondary | ICD-10-CM | POA: Diagnosis not present

## 2019-04-25 ENCOUNTER — Telehealth: Payer: Self-pay

## 2019-04-25 DIAGNOSIS — E669 Obesity, unspecified: Secondary | ICD-10-CM | POA: Diagnosis not present

## 2019-04-25 DIAGNOSIS — M25461 Effusion, right knee: Secondary | ICD-10-CM | POA: Diagnosis not present

## 2019-04-25 DIAGNOSIS — G8929 Other chronic pain: Secondary | ICD-10-CM | POA: Diagnosis not present

## 2019-04-25 DIAGNOSIS — E119 Type 2 diabetes mellitus without complications: Secondary | ICD-10-CM | POA: Diagnosis not present

## 2019-04-25 DIAGNOSIS — M25561 Pain in right knee: Secondary | ICD-10-CM | POA: Diagnosis not present

## 2019-04-25 DIAGNOSIS — M25569 Pain in unspecified knee: Secondary | ICD-10-CM

## 2019-04-25 DIAGNOSIS — M2391 Unspecified internal derangement of right knee: Secondary | ICD-10-CM | POA: Diagnosis not present

## 2019-04-25 NOTE — Telephone Encounter (Signed)
Called to reschedule per patient request. Left message.   Copied from [place] [phone]. Topic: Appointment Scheduling - Scheduling Inquiry for Clinic >> Apr 23, 2019 12:03 PM Oneta Rack wrote: Patient cancelled Thursday 04/26/2019 appt with Dr. Jeananne Rama and would like to [facility] to another Thursday, please advise >> Apr 25, 2019  9:24 AM Don Perking M wrote: LVM for pt to call back, forwarding as [facility]

## 2019-04-26 ENCOUNTER — Ambulatory Visit: Payer: Medicare HMO

## 2019-04-26 DIAGNOSIS — J3489 Other specified disorders of nose and nasal sinuses: Secondary | ICD-10-CM | POA: Diagnosis not present

## 2019-04-26 DIAGNOSIS — I871 Compression of vein: Secondary | ICD-10-CM | POA: Diagnosis not present

## 2019-04-27 ENCOUNTER — Telehealth: Payer: Self-pay | Admitting: Family Medicine

## 2019-04-27 NOTE — Telephone Encounter (Signed)
Pt called in to reschedule aww would like a call back

## 2019-04-29 ENCOUNTER — Other Ambulatory Visit: Payer: Self-pay | Admitting: Family Medicine

## 2019-04-29 DIAGNOSIS — E1159 Type 2 diabetes mellitus with other circulatory complications: Secondary | ICD-10-CM

## 2019-05-02 NOTE — Telephone Encounter (Signed)
Called patient and rescheduled AWV for 05/10/2019

## 2019-05-03 DIAGNOSIS — Z6841 Body Mass Index (BMI) 40.0 and over, adult: Secondary | ICD-10-CM | POA: Diagnosis not present

## 2019-05-03 DIAGNOSIS — G8929 Other chronic pain: Secondary | ICD-10-CM | POA: Diagnosis not present

## 2019-05-03 DIAGNOSIS — M2391 Unspecified internal derangement of right knee: Secondary | ICD-10-CM | POA: Diagnosis not present

## 2019-05-03 DIAGNOSIS — E119 Type 2 diabetes mellitus without complications: Secondary | ICD-10-CM | POA: Diagnosis not present

## 2019-05-03 DIAGNOSIS — M25561 Pain in right knee: Secondary | ICD-10-CM | POA: Diagnosis not present

## 2019-05-03 DIAGNOSIS — M25361 Other instability, right knee: Secondary | ICD-10-CM | POA: Diagnosis not present

## 2019-05-03 DIAGNOSIS — M25461 Effusion, right knee: Secondary | ICD-10-CM | POA: Diagnosis not present

## 2019-05-04 ENCOUNTER — Other Ambulatory Visit: Payer: Self-pay | Admitting: Orthopedic Surgery

## 2019-05-04 DIAGNOSIS — G8929 Other chronic pain: Secondary | ICD-10-CM

## 2019-05-04 DIAGNOSIS — M25361 Other instability, right knee: Secondary | ICD-10-CM

## 2019-05-08 ENCOUNTER — Ambulatory Visit
Admission: RE | Admit: 2019-05-08 | Discharge: 2019-05-08 | Disposition: A | Discharge status: Home / Self Care | Payer: Medicare HMO | Source: Ambulatory Visit | Attending: Orthopedic Surgery | Admitting: Orthopedic Surgery

## 2019-05-08 ENCOUNTER — Other Ambulatory Visit: Payer: Self-pay

## 2019-05-08 DIAGNOSIS — G8929 Other chronic pain: Secondary | ICD-10-CM | POA: Insufficient documentation

## 2019-05-08 DIAGNOSIS — M25461 Effusion, right knee: Secondary | ICD-10-CM | POA: Diagnosis not present

## 2019-05-08 DIAGNOSIS — M25561 Pain in right knee: Secondary | ICD-10-CM | POA: Insufficient documentation

## 2019-05-08 DIAGNOSIS — M25361 Other instability, right knee: Secondary | ICD-10-CM | POA: Diagnosis not present

## 2019-05-08 DIAGNOSIS — M23321 Other meniscus derangements, posterior horn of medial meniscus, right knee: Secondary | ICD-10-CM | POA: Diagnosis not present

## 2019-05-10 ENCOUNTER — Ambulatory Visit (INDEPENDENT_AMBULATORY_CARE_PROVIDER_SITE_OTHER): Payer: Medicare HMO

## 2019-05-10 VITALS — BP 134/80 | HR 59 | Temp 97.1°F

## 2019-05-10 DIAGNOSIS — Z0000 Encounter for general adult medical examination without abnormal findings: Secondary | ICD-10-CM | POA: Diagnosis not present

## 2019-05-10 NOTE — Patient Instructions (Signed)
[name] , Thank you for taking time to come for your Medicare Wellness Visit. I appreciate your ongoing commitment to your health goals. Please review the following plan we discussed and let me know if I can assist you in the future.   Screening recommendations/referrals: Colonoscopy: completed 05/28/2016 Recommended yearly ophthalmology/optometry visit for glaucoma screening and checkup Recommended yearly dental visit for hygiene and checkup  Vaccinations: Influenza vaccine: up to date Pneumococcal vaccine: up to date Tdap vaccine: due, check with your insurance company for coverage  Shingles vaccine: shingrix eligible, check with your insurance company for coverage   advanced directives: Please bring a copy of your health care power of attorney and living will to the office at your convenience.  Conditions/risks identified: diabetic  Next appointment: Follow up in one year for your annual wellness exam.   Preventive Care 65 Years and Older, Male Preventive care refers to lifestyle choices and visits with your health care provider that can promote health and wellness. What does preventive care include?  A yearly physical exam. This is also called an annual well check.  Dental exams once or twice a year.  Routine eye exams. Ask your health care provider how often you should have your eyes checked.  Personal lifestyle choices, including:  Daily care of your teeth and gums.  Regular physical activity.  Eating a healthy diet.  Avoiding tobacco and drug use.  Limiting alcohol use.  Practicing safe sex.  Taking low doses of aspirin every day.  Taking vitamin and mineral supplements as recommended by your health care provider. What happens during an annual well check? The services and screenings done by your health care provider during your annual well check will depend on your age, overall health, lifestyle risk factors, and family history of disease. Counseling  Your  health care provider may ask you questions about your:  Alcohol use.  Tobacco use.  Drug use.  Emotional well-being.  Home and relationship well-being.  Sexual activity.  Eating habits.  History of falls.  Memory and ability to understand (cognition).  Work and work Statistician. Screening  You may have the following tests or measurements:  Height, weight, and BMI.  Blood pressure.  Lipid and cholesterol levels. These may be checked every 5 years, or more frequently if you are over 57 years old.  Skin check.  Lung cancer screening. You may have this screening every year starting at age 14 if you have a 30-pack-year history of smoking and currently smoke or have quit within the past 15 years.  Fecal occult blood test (FOBT) of the stool. You may have this test every year starting at age 30.  Flexible sigmoidoscopy or colonoscopy. You may have a sigmoidoscopy every 5 years or a colonoscopy every 10 years starting at age 21.  Prostate cancer screening. Recommendations will vary depending on your family history and other risks.  Hepatitis C blood test.  Hepatitis B blood test.  Sexually transmitted disease (STD) testing.  Diabetes screening. This is done by checking your blood sugar (glucose) after you have not eaten for a while (fasting). You may have this done every 1-3 years.  Abdominal aortic aneurysm (AAA) screening. You may need this if you are a current or former smoker.  Osteoporosis. You may be screened starting at age 36 if you are at high risk. Talk with your health care provider about your test results, treatment options, and if necessary, the need for more tests. Vaccines  Your health care provider may  recommend certain vaccines, such as:  Influenza vaccine. This is recommended every year.  Tetanus, diphtheria, and acellular pertussis (Tdap, Td) vaccine. You may need a Td booster every 10 years.  Zoster vaccine. You may need this after age 49.   Pneumococcal 13-valent conjugate (PCV13) vaccine. One dose is recommended after age 42.  Pneumococcal polysaccharide (PPSV23) vaccine. One dose is recommended after age 66. Talk to your health care provider about which screenings and vaccines you need and how often you need them. This information is not intended to replace advice given to you by your health care provider. Make sure you discuss any questions you have with your health care provider. Document Released: 11/21/2015 Document Revised: 07/14/2016 Document Reviewed: 08/26/2015 Elsevier Interactive Patient Education  2017 [place] Prevention in the Home Falls can cause injuries. They can happen to people of all ages. There are many things you can do to make your home safe and to help prevent falls. What can I do on the outside of my home?  Regularly fix the edges of walkways and driveways and fix any cracks.  Remove anything that might make you trip as you walk through a door, such as a raised step or threshold.  Trim any bushes or trees on the path to your home.  Use bright outdoor lighting.  Clear any walking paths of anything that might make someone trip, such as rocks or tools.  Regularly check to see if handrails are loose or broken. Make sure that both sides of any steps have handrails.  Any raised decks and porches should have guardrails on the edges.  Have any leaves, snow, or ice cleared regularly.  Use sand or salt on walking paths during winter.  Clean up any spills in your garage right away. This includes oil or grease spills. What can I do in the bathroom?  Use night lights.  Install grab bars by the toilet and in the tub and shower. Do not use towel bars as grab bars.  Use non-skid mats or decals in the tub or shower.  If you need to sit down in the shower, use a plastic, non-slip stool.  Keep the floor dry. Clean up any water that spills on the floor as soon as it happens.  Remove soap  buildup in the tub or shower regularly.  Attach bath mats securely with double-sided non-slip rug tape.  Do not have throw rugs and other things on the floor that can make you trip. What can I do in the bedroom?  Use night lights.  Make sure that you have a light by your bed that is easy to reach.  Do not use any sheets or blankets that are too big for your bed. They should not hang down onto the floor.  Have a firm chair that has side arms. You can use this for support while you get dressed.  Do not have throw rugs and other things on the floor that can make you trip. What can I do in the kitchen?  Clean up any spills right away.  Avoid walking on wet floors.  Keep items that you use a lot in easy-to-reach places.  If you need to reach something above you, use a strong step stool that has a grab bar.  Keep electrical cords out of the way.  Do not use floor polish or wax that makes floors slippery. If you must use wax, use non-skid floor wax.  Do not have throw rugs and  other things on the floor that can make you trip. What can I do with my stairs?  Do not leave any items on the stairs.  Make sure that there are handrails on both sides of the stairs and use them. Fix handrails that are broken or loose. Make sure that handrails are as long as the stairways.  Check any carpeting to make sure that it is firmly attached to the stairs. Fix any carpet that is loose or worn.  Avoid having throw rugs at the top or bottom of the stairs. If you do have throw rugs, attach them to the floor with carpet tape.  Make sure that you have a light switch at the top of the stairs and the bottom of the stairs. If you do not have them, ask someone to add them for you. What else can I do to help prevent falls?  Wear shoes that:  Do not have high heels.  Have rubber bottoms.  Are comfortable and fit you well.  Are closed at the toe. Do not wear sandals.  If you use a stepladder:  Make  sure that it is fully opened. Do not climb a closed stepladder.  Make sure that both sides of the stepladder are locked into place.  Ask someone to hold it for you, if possible.  Clearly mark and make sure that you can see:  Any grab bars or handrails.  First and last steps.  Where the edge of each step is.  Use tools that help you move around (mobility aids) if they are needed. These include:  Canes.  Walkers.  Scooters.  Crutches.  Turn on the lights when you go into a dark area. Replace any light bulbs as soon as they burn out.  Set up your furniture so you have a clear path. Avoid moving your furniture around.  If any of your floors are uneven, fix them.  If there are any pets around you, be aware of where they are.  Review your medicines with your doctor. Some medicines can make you feel dizzy. This can increase your chance of falling. Ask your doctor what other things that you can do to help prevent falls. This information is not intended to replace advice given to you by your health care provider. Make sure you discuss any questions you have with your health care provider. Document Released: 08/21/2009 Document Revised: 04/01/2016 Document Reviewed: 11/29/2014 Elsevier Interactive Patient Education  2017 [employer].

## 2019-05-10 NOTE — Progress Notes (Signed)
Subjective:   Earnestine Mealinged Allen Johal Jr. is a 68 y.o. male who presents for Medicare Annual/Subsequent preventive examination.  This visit is being conducted via phone call  - after an attmept to do on video chat - due to the COVID-19 pandemic. This patient has given me verbal consent via phone to conduct this visit, patient states they are participating from their home address. Some vital signs may be absent or patient reported.   Patient identification: identified by name, DOB, and current address.    Review of Systems:  Cardiac Risk Factors include: advanced age (>3055men, 51>65 women);diabetes mellitus;dyslipidemia;hypertension;male gender     Objective:    Vitals: BP 134/80 Comment: patient reported  Pulse (!) 59 Comment: patient reported  Temp (!) 97.1 F (36.2 C) (Oral) Comment: patient reported  There is no height or weight on file to calculate BMI.  Advanced Directives 05/10/2019 01/06/2019 08/16/2018 08/16/2018 08/16/2018 02/23/2018 11/20/2017  Does Patient Have a Medical Advance Directive? Yes No Yes Yes Yes No No  Type of Advance Directive Living will;Healthcare Power of English as a second language teacherAttorney - Healthcare Power of State Street Corporationttorney Healthcare Power of [place] EstatesAttorney;Living will Healthcare Power of Attorney - -  Does patient want to make changes to medical advance directive? - - No - Patient declined No - Patient declined - - -  Copy of Healthcare Power of Attorney in Chart? No - copy requested - No - copy requested No - copy requested No - copy requested - -  Would patient like information on creating a medical advance directive? - No - Patient declined No - Patient declined No - Patient declined - No - Patient declined No - Patient declined    Tobacco Social History   Tobacco Use  Smoking Status Never Smoker  Smokeless Tobacco Never Used     Counseling given: Not Answered   Clinical Intake:  Pre-visit preparation completed: Yes  Pain : 0-10 Pain Score: 7  Pain Type: Acute pain Pain Location: Knee Pain  Orientation: Right Pain Descriptors / Indicators: Aching Pain Onset: More than a month ago Pain Frequency: Intermittent     Nutritional Risks: None Diabetes: Yes CBG done?: No Did pt. bring in CBG monitor from home?: No  How often do you need to have someone help you when you read instructions, pamphlets, or other written materials from your doctor or pharmacy?: 1 - Never What is the last grade level you completed in school?: some college  Nutrition Risk Assessment:  Has the patient had any N/V/D within the last 2 months?  No  Does the patient have any non-healing wounds?  No  Has the patient had any unintentional weight loss or weight gain?  No   Diabetes:  Is the patient diabetic?  Yes  If diabetic, was a CBG obtained today?  No  Did the patient bring in their glucometer from home?  No  How often do you monitor your CBG's? Once a day   Financial Strains and Diabetes Management:  Are you having any financial strains with the device, your supplies or your medication? No .  Does the patient want to be seen by Chronic Care Management for management of their diabetes?  No  Would the patient like to be referred to a Nutritionist or for Diabetic Management?  No   Diabetic Exams:  Diabetic Eye Exam: will get my eye dr to fax results.   Diabetic Foot Exam: due at next in office visit   Interpreter Needed?: No  Information entered by :: Tiffany  Hill,LPN  Past Medical History:  Diagnosis Date  . Chronic deep vein thrombosis (DVT) (HCC)   . Chronic low back pain   . Chronic systolic heart failure (HCC)    a. TTE 2011 with EF 40-45% per notes  . Diabetes mellitus without complication (HCC)   . Erectile dysfunction   . HLD (hyperlipidemia)   . Hypertension   . Hypogonadism in male   . Permanent atrial fibrillation    a. s/p DCCV 2011; b. redeveloped Afib in 2016; c. CHADS2VASc at least 5 (CHF, HTN, age x 1, DM, vascular disease); d. on Xarelto  . Torn meniscus    right  knee   Past Surgical History:  Procedure Laterality Date  . COLONOSCOPY WITH PROPOFOL N/A 05/28/2016   Procedure: COLONOSCOPY WITH PROPOFOL;  Surgeon: Midge Miniumarren Wohl, MD;  [facility];  Service: Endoscopy;  Laterality: N/A;  diabetic - oral meds  . DVT, leg Left   . INSERTION OF VENA CAVA FILTER  2014  . PERIPHERAL VASCULAR THROMBECTOMY Left 12/01/2017   Procedure: PERIPHERAL VASCULAR THROMBECTOMY;  Surgeon: Renford DillsSchnier, Gregory G, MD;  Location: [facility] INVASIVE CV LAB;  Service: Cardiovascular;  Laterality: Left;   Family History  Problem Relation Age of Onset  . Lymphoma Mother   . Heart failure Father   . Diabetes Father   . Diabetes Mellitus II Other   . Heart attack Neg Hx   . Hypertension Neg Hx   . Cancer Neg Hx   . COPD Neg Hx   . Stroke Neg Hx   . Prostate cancer Neg Hx   . Kidney cancer Neg Hx   . Bladder Cancer Neg Hx    Social History   Socioeconomic History  . Marital status: Single    Spouse name: Not on file  . Number of children: Not on file  . Years of education: Not on file  . Highest education level: Some college, no degree  Occupational History  . 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 Financial resource strain: Not hard at all  . Food insecurity    Worry: Never true    Inability: Never true  . Transportation needs    Medical: No    Non-medical: No  Tobacco Use  . Smoking status: Never Smoker  . Smokeless tobacco: Never Used  Substance and Sexual Activity  . Alcohol use: No    Alcohol/week: 0.0 standard drinks  . Drug use: No  . Sexual activity: Not on file  Lifestyle  . Physical activity    Days per week: 3 days    Minutes per session: 150+ min  . Stress: Not at all  Relationships  . Social Musicianconnections    Talks on phone: Twice a week    Gets together: More than three times a week    Attends religious service: More than 4 times per year    Active member of club or organization: Yes    Attends meetings of clubs or  organizations: More than 4 times per year    Relationship status: Living with partner  Other Topics Concern  . Not on file  Social History Narrative   Former Emergency planning/management officerpolice officer    Independent at baseline. [employer]   Works at [employer]     Outpatient Encounter Medications as of 05/10/2019  Medication Sig  . aspirin 325 MG tablet Take 325 mg by mouth daily.  Marland Kitchen. atorvastatin (LIPITOR) 20 MG tablet TAKE 1/2 TABLET BY MOUTH EVERY DAY  .  benazepril (LOTENSIN) 40 MG tablet TAKE 1 TABLET BY MOUTH EVERY DAY  . diltiazem (CARDIZEM CD) 120 MG 24 hr capsule Take 1 capsule (120 mg total) by mouth daily.  Marland Kitchen. glucose blood (CONTOUR NEXT TEST) test strip Use as instructed  . metFORMIN (GLUCOPHAGE) 1000 MG tablet TAKE 1 TABLET (1,000 MG TOTAL) BY MOUTH 2 (TWO) TIMES DAILY WITH A MEAL.  . metoprolol tartrate (LOPRESSOR) 50 MG tablet Take 1 tablet (50 mg total) by mouth 2 (two) times daily.  . montelukast (SINGULAIR) 10 MG tablet Take 10 mg by mouth daily.   . rivaroxaban (XARELTO) 20 MG TABS tablet Take 1 tablet (20 mg total) by mouth daily.  . chlorpheniramine-HYDROcodone (TUSSIONEX PENNKINETIC ER) 10-8 MG/5ML SUER Take 2.5-5 mLs by mouth every 12 (twelve) hours as needed for cough. (Patient not taking: Reported on 05/10/2019)   No facility-administered encounter medications on file as of 05/10/2019.     Activities of Daily Living In your present state of health, do you have any difficulty performing the following activities: 05/10/2019 08/16/2018  Hearing? N N  Vision? N N  Difficulty concentrating or making decisions? N N  Walking or climbing stairs? N N  Dressing or bathing? N N  Doing errands, shopping? N N  Preparing Food and eating ? N -  Using the Toilet? N -  In the past six months, have you accidently leaked urine? N -  Do you have problems with loss of bowel control? N -  Managing your Medications? N -  Managing your Finances? N -  Housekeeping or managing your Housekeeping? N -  Some  recent data might be hidden    Patient Care Team: Steele Sizerrissman, Mark A, MD as PCP - General Mariah MillingGollan, Tollie Pizzaimothy J, MD as PCP - Cardiology (Cardiology) Antonieta IbaGollan, Timothy J, MD as Consulting Physician (Cardiology) Bud FaceVaught, Creighton, MD as Referring Physician (Otolaryngology) Veva HolesPabon-Ramos, Waleska, MD as Referring Physician (Diagnostic Radiology)   Assessment:   This is a routine wellness examination for Eissa.  Exercise Activities and Dietary recommendations Current Exercise Habits: The patient has a physically strenuous job, but has no regular exercise apart from work., Exercise limited by: None identified  Goals    . DIET - INCREASE WATER INTAKE     Recommend drinking at least 6-8 glasses of water a day        Fall Risk Fall Risk  05/10/2019 09/07/2018 05/25/2018 03/02/2018 03/02/2018  Falls in the past year? 0 No No No No   FALL RISK PREVENTION PERTAINING TO THE HOME:  Any stairs in or around the home? Yes  If so, are there any without handrails? No   Home free of loose throw rugs in walkways, pet beds, electrical cords, etc? Yes  Adequate lighting in your home to reduce risk of falls? Yes   ASSISTIVE DEVICES UTILIZED TO PREVENT FALLS:  Life alert? No  Use of a cane, walker or w/c? No  Grab bars in the bathroom? No  Shower chair or bench in shower? No  Elevated toilet seat or a handicapped toilet? No    TIMED UP AND GO:  Unable to perform   Depression Screen PHQ 2/9 Scores 05/10/2019 03/02/2018 03/02/2018 02/23/2018  PHQ - 2 Score 0 0 0 0    Cognitive Function        Immunization History  Administered Date(s) Administered  . Influenza, High Dose Seasonal PF 10/04/2016, 09/07/2017, 09/13/2018  . Influenza,inj,Quad PF,6+ Mos 09/01/2015  . Pneumococcal Conjugate-13 10/04/2016  . Pneumococcal Polysaccharide-23 07/30/2009, 02/23/2018  .  Zoster 03/18/2014    Qualifies for Shingles Vaccine? Yes  Zostavax completed 03/18/2014. Due for Shingrix. Education has been provided regarding  the importance of this vaccine. Pt has been advised to call insurance company to determine out of pocket expense. Advised may also receive vaccine at local pharmacy or Health Dept. Verbalized acceptance and understanding.  Tdap: Discussed need for TD/TDAP vaccine, patient verbalized understanding that this is not covered as a preventative with there insurance and to call the office if he  develops any new skin injuries, ie: cuts, scrapes, bug bites, or open wounds.  Flu Vaccine: up to date   Pneumococcal Vaccine: up to date   Screening Tests Health Maintenance  Topic Date Due  . OPHTHALMOLOGY EXAM  01/27/2016  . FOOT EXAM  01/18/2017  . TETANUS/TDAP  12/09/2018  . HEMOGLOBIN A1C  03/08/2019  . INFLUENZA VACCINE  06/09/2019  . COLONOSCOPY  05/28/2026  . Hepatitis C Screening  Completed  . PNA vac Low Risk Adult  Completed   Cancer Screenings:  Colorectal Screening: Completed 05/28/2016 Repeat every 10 years  Lung Cancer Screening: (Low Dose CT Chest recommended if Age 39-80 years, 30 pack-year currently smoking OR have quit w/in 15years.) does not qualify.     Additional Screening:  Hepatitis C Screening: does qualify; Completed 02/23/2018  Dental Screening: Recommended annual dental exams for proper oral hygiene  Community Resource Referral:  CRR required this visit?  No        Plan:  I have personally reviewed and addressed the Medicare Annual Wellness questionnaire and have noted the following in the patient's chart:  A. Medical and social history B. Use of alcohol, tobacco or illicit drugs  C. Current medications and supplements D. Functional ability and status E.  Nutritional status F.  Physical activity G. Advance directives H. List of other physicians I.  Hospitalizations, surgeries, and ER visits in previous 12 months J.  [place] such as hearing and vision if needed, cognitive and depression L. Referrals and appointments   In addition, I have  reviewed and discussed with patient certain preventive protocols, quality metrics, and best practice recommendations. A written personalized care plan for preventive services as well as general preventive health recommendations were provided to patient.   Signed,   Bevelyn Ngo, LPN  01/11/4655 Nurse Health Advisor  Nurse Notes: none

## 2019-05-19 ENCOUNTER — Other Ambulatory Visit: Payer: Self-pay | Admitting: Family Medicine

## 2019-05-19 DIAGNOSIS — I10 Essential (primary) hypertension: Secondary | ICD-10-CM

## 2019-05-20 ENCOUNTER — Other Ambulatory Visit: Payer: Self-pay | Admitting: Cardiovascular Disease

## 2019-05-20 NOTE — Telephone Encounter (Signed)
Requested Prescriptions  Pending Prescriptions Disp Refills  . metoprolol tartrate (LOPRESSOR) 50 MG tablet [Pharmacy Med Name: METOPROLOL TARTRATE 50 MG TAB] 180 tablet 0    Sig: TAKE 1 TABLET BY MOUTH TWICE A DAY     Cardiovascular:  Beta Blockers Passed - 05/19/2019  9:21 AM      Passed - Last BP in normal range    BP Readings from Last 1 Encounters:  05/10/19 134/80         Passed - Last Heart Rate in normal range    Pulse Readings from Last 1 Encounters:  05/10/19 (!) 59         Passed - Valid encounter within last 6 months    Recent Outpatient Visits          6 months ago Chronic sinusitis, unspecified location   [place], [place]   8 months ago Need for influenza vaccination   [place] [facility] Jeananne Rama, Jeannette How, MD   8 months ago HYPERTENSION, BENIGN   [facility] Crissman, Jeannette How, MD   12 months ago Non-insulin dependent type 2 diabetes mellitus [facility])   [facility] Crissman, Jeannette How, MD   1 year ago Advanced care planning/counseling discussion   [facility] Crissman, Jeannette How, MD

## 2019-05-21 NOTE — Telephone Encounter (Signed)
Refill Request.  

## 2019-05-21 NOTE — Telephone Encounter (Signed)
Pt's wt 129.3 kg, age 68, SCr 1.18, CrCl 108.58.

## 2019-05-22 DIAGNOSIS — G8929 Other chronic pain: Secondary | ICD-10-CM | POA: Diagnosis not present

## 2019-05-22 DIAGNOSIS — M1712 Unilateral primary osteoarthritis, left knee: Secondary | ICD-10-CM | POA: Diagnosis not present

## 2019-05-22 DIAGNOSIS — M25561 Pain in right knee: Secondary | ICD-10-CM | POA: Diagnosis not present

## 2019-05-24 ENCOUNTER — Other Ambulatory Visit: Payer: Self-pay

## 2019-05-24 ENCOUNTER — Encounter: Payer: Self-pay | Admitting: *Deleted

## 2019-05-25 DIAGNOSIS — I871 Compression of vein: Secondary | ICD-10-CM | POA: Diagnosis not present

## 2019-05-28 ENCOUNTER — Other Ambulatory Visit: Payer: Self-pay

## 2019-05-28 ENCOUNTER — Other Ambulatory Visit
Admission: RE | Admit: 2019-05-28 | Discharge: 2019-05-28 | Disposition: A | Discharge status: Home / Self Care | Payer: Medicare HMO | Source: Ambulatory Visit | Attending: Orthopedic Surgery | Admitting: Orthopedic Surgery

## 2019-05-28 DIAGNOSIS — Z1159 Encounter for screening for other viral diseases: Secondary | ICD-10-CM | POA: Diagnosis not present

## 2019-05-29 LAB — SARS CORONAVIRUS 2 (TAT 6-24 HRS): SARS Coronavirus 2: NEGATIVE

## 2019-05-31 ENCOUNTER — Encounter
Admission: RE | Disposition: A | Discharge status: Home / Self Care | Payer: Self-pay | Source: Home / Self Care | Attending: Orthopedic Surgery

## 2019-05-31 ENCOUNTER — Ambulatory Visit: Payer: Medicare HMO | Admitting: Anesthesiology

## 2019-05-31 ENCOUNTER — Ambulatory Visit
Admission: RE | Admit: 2019-05-31 | Discharge: 2019-05-31 | Disposition: A | Discharge status: Home / Self Care | Payer: Medicare HMO | Attending: Orthopedic Surgery | Admitting: Orthopedic Surgery

## 2019-05-31 ENCOUNTER — Other Ambulatory Visit: Payer: Self-pay

## 2019-05-31 DIAGNOSIS — S83241A Other tear of medial meniscus, current injury, right knee, initial encounter: Secondary | ICD-10-CM | POA: Diagnosis not present

## 2019-05-31 DIAGNOSIS — I420 Dilated cardiomyopathy: Secondary | ICD-10-CM | POA: Insufficient documentation

## 2019-05-31 DIAGNOSIS — Z7984 Long term (current) use of oral hypoglycemic drugs: Secondary | ICD-10-CM | POA: Diagnosis not present

## 2019-05-31 DIAGNOSIS — Z7901 Long term (current) use of anticoagulants: Secondary | ICD-10-CM | POA: Insufficient documentation

## 2019-05-31 DIAGNOSIS — I482 Chronic atrial fibrillation, unspecified: Secondary | ICD-10-CM | POA: Insufficient documentation

## 2019-05-31 DIAGNOSIS — Z79899 Other long term (current) drug therapy: Secondary | ICD-10-CM | POA: Insufficient documentation

## 2019-05-31 DIAGNOSIS — I110 Hypertensive heart disease with heart failure: Secondary | ICD-10-CM | POA: Insufficient documentation

## 2019-05-31 DIAGNOSIS — Z7951 Long term (current) use of inhaled steroids: Secondary | ICD-10-CM | POA: Diagnosis not present

## 2019-05-31 DIAGNOSIS — M94261 Chondromalacia, right knee: Secondary | ICD-10-CM | POA: Diagnosis not present

## 2019-05-31 DIAGNOSIS — I509 Heart failure, unspecified: Secondary | ICD-10-CM | POA: Insufficient documentation

## 2019-05-31 DIAGNOSIS — E1151 Type 2 diabetes mellitus with diabetic peripheral angiopathy without gangrene: Secondary | ICD-10-CM | POA: Insufficient documentation

## 2019-05-31 DIAGNOSIS — I4821 Permanent atrial fibrillation: Secondary | ICD-10-CM | POA: Insufficient documentation

## 2019-05-31 DIAGNOSIS — X58XXXA Exposure to other specified factors, initial encounter: Secondary | ICD-10-CM | POA: Insufficient documentation

## 2019-05-31 DIAGNOSIS — M2241 Chondromalacia patellae, right knee: Secondary | ICD-10-CM | POA: Diagnosis not present

## 2019-05-31 DIAGNOSIS — Z86718 Personal history of other venous thrombosis and embolism: Secondary | ICD-10-CM | POA: Insufficient documentation

## 2019-05-31 HISTORY — PX: KNEE ARTHROSCOPY WITH MEDIAL MENISECTOMY: SHX5651

## 2019-05-31 LAB — GLUCOSE, CAPILLARY
Glucose-Capillary: 214 mg/dL — ABNORMAL HIGH (ref 70–99)
Glucose-Capillary: 245 mg/dL — ABNORMAL HIGH (ref 70–99)
Glucose-Capillary: 143 mg/dL — ABNORMAL HIGH (ref 70–99)

## 2019-05-31 SURGERY — ARTHROSCOPY, KNEE, WITH MEDIAL MENISCECTOMY
Anesthesia: General | Site: Knee | Laterality: Right

## 2019-05-31 MED ORDER — LACTATED RINGERS IV SOLN: INTRAVENOUS | Status: DC

## 2019-05-31 MED ORDER — ACETAMINOPHEN 500 MG PO TABS: 1000.0000 mg | ORAL_TABLET | Freq: Three times a day (TID) | ORAL | 2 refills | Status: AC

## 2019-05-31 MED ORDER — FENTANYL CITRATE (PF) 100 MCG/2ML IJ SOLN
INTRAMUSCULAR | Status: DC | PRN
  Administered 2019-05-31: 25 ug via INTRAVENOUS
  Administered 2019-05-31: 50 ug via INTRAVENOUS
  Administered 2019-05-31: 25 ug via INTRAVENOUS

## 2019-05-31 MED ORDER — OXYCODONE HCL 5 MG PO TABS: 5.0000 mg | ORAL_TABLET | Freq: Once | ORAL | Status: DC | PRN

## 2019-05-31 MED ORDER — CEFAZOLIN SODIUM-DEXTROSE 2-4 GM/100ML-% IV SOLN
2.0000 g | Freq: Once | INTRAVENOUS | Status: AC
  Administered 2019-05-31: 2 g via INTRAVENOUS

## 2019-05-31 MED ORDER — FENTANYL CITRATE (PF) 100 MCG/2ML IJ SOLN: 25.0000 ug | INTRAMUSCULAR | Status: DC | PRN

## 2019-05-31 MED ORDER — PHENYLEPHRINE HCL (PRESSORS) 10 MG/ML IV SOLN
INTRAVENOUS | Status: DC | PRN
  Administered 2019-05-31 (×5): 100 ug via INTRAVENOUS

## 2019-05-31 MED ORDER — LACTATED RINGERS IV SOLN
INTRAVENOUS | Status: DC | PRN
  Administered 2019-05-31: 13:00:00 via INTRAVENOUS

## 2019-05-31 MED ORDER — LIDOCAINE-EPINEPHRINE 1 %-1:100000 IJ SOLN
INTRAMUSCULAR | Status: DC | PRN
  Administered 2019-05-31: 5 mL
  Administered 2019-05-31: 1.5 mL

## 2019-05-31 MED ORDER — METOPROLOL TARTRATE 5 MG/5ML IV SOLN
5.0000 mg | Freq: Once | INTRAVENOUS | Status: AC
  Administered 2019-05-31: 5 mg via INTRAVENOUS

## 2019-05-31 MED ORDER — DEXAMETHASONE SODIUM PHOSPHATE 4 MG/ML IJ SOLN
INTRAMUSCULAR | Status: DC | PRN
  Administered 2019-05-31: 4 mg via INTRAVENOUS

## 2019-05-31 MED ORDER — PROPOFOL 10 MG/ML IV BOLUS
INTRAVENOUS | Status: DC | PRN
  Administered 2019-05-31: 200 mg via INTRAVENOUS

## 2019-05-31 MED ORDER — OXYCODONE HCL 5 MG/5ML PO SOLN: 5.0000 mg | Freq: Once | ORAL | Status: DC | PRN

## 2019-05-31 MED ORDER — BUPIVACAINE HCL (PF) 0.5 % IJ SOLN
INTRAMUSCULAR | Status: DC | PRN
  Administered 2019-05-31: 5 mL
  Administered 2019-05-31: 1.5 mL

## 2019-05-31 MED ORDER — LIDOCAINE HCL (CARDIAC) PF 100 MG/5ML IV SOSY
PREFILLED_SYRINGE | INTRAVENOUS | Status: DC | PRN
  Administered 2019-05-31: 30 mg via INTRATRACHEAL

## 2019-05-31 MED ORDER — NON FORMULARY
5.0000 [IU] | Freq: Once | Status: AC
  Administered 2019-05-31: 5 [IU] via INTRAVENOUS

## 2019-05-31 MED ORDER — MIDAZOLAM HCL 5 MG/5ML IJ SOLN
INTRAMUSCULAR | Status: DC | PRN
  Administered 2019-05-31: 2 mg via INTRAVENOUS

## 2019-05-31 MED ORDER — ONDANSETRON HCL 4 MG/2ML IJ SOLN
INTRAMUSCULAR | Status: DC | PRN
  Administered 2019-05-31: 4 mg via INTRAVENOUS

## 2019-05-31 MED ORDER — HYDROCODONE-ACETAMINOPHEN 5-325 MG PO TABS: 1.0000 | ORAL_TABLET | ORAL | 0 refills | Status: DC | PRN

## 2019-05-31 MED ORDER — ACETAMINOPHEN 10 MG/ML IV SOLN: 1000.0000 mg | Freq: Once | INTRAVENOUS | Status: DC | PRN

## 2019-05-31 MED ORDER — ONDANSETRON HCL 4 MG/2ML IJ SOLN: 4.0000 mg | Freq: Once | INTRAMUSCULAR | Status: DC | PRN

## 2019-05-31 MED ORDER — ONDANSETRON 4 MG PO TBDP: 4.0000 mg | ORAL_TABLET | Freq: Three times a day (TID) | ORAL | 0 refills | Status: DC | PRN

## 2019-05-31 SURGICAL SUPPLY — 36 items
ADAPTER IRRIG TUBE 2 SPIKE SOL (ADAPTER) ×4 IMPLANT
BLADE SURG SZ11 CARB STEEL (BLADE) ×2 IMPLANT
BNDG COHESIVE 4X5 TAN STRL (GAUZE/BANDAGES/DRESSINGS) ×2 IMPLANT
BNDG ESMARK 6X12 TAN STRL LF (GAUZE/BANDAGES/DRESSINGS) ×2 IMPLANT
BUR RADIUS 3.5 (BURR) ×2 IMPLANT
CHLORAPREP W/TINT 26 (MISCELLANEOUS) ×2 IMPLANT
COOLER POLAR GLACIER W/PUMP (MISCELLANEOUS) ×2 IMPLANT
COVER LIGHT HANDLE UNIVERSAL (MISCELLANEOUS) ×4 IMPLANT
CUFF TOURN SGL QUICK 30 (TOURNIQUET CUFF) ×1
CUFF TRNQT CYL 30X4X21-28X (TOURNIQUET CUFF) ×1 IMPLANT
DRAPE IMP U-DRAPE 54X76 (DRAPES) ×2 IMPLANT
GAUZE SPONGE 4X4 12PLY STRL (GAUZE/BANDAGES/DRESSINGS) ×2 IMPLANT
GLOVE BIO SURGEON STRL SZ7.5 (GLOVE) ×2 IMPLANT
GLOVE BIOGEL PI IND STRL 8 (GLOVE) ×1 IMPLANT
GLOVE BIOGEL PI INDICATOR 8 (GLOVE) ×1
GOWN STRL REIN 2XL XLG LVL4 (GOWN DISPOSABLE) ×2 IMPLANT
GOWN STRL REUS W/ TWL LRG LVL3 (GOWN DISPOSABLE) ×1 IMPLANT
GOWN STRL REUS W/TWL LRG LVL3 (GOWN DISPOSABLE) ×1
IV LACTATED RINGER IRRG 3000ML (IV SOLUTION) ×4
IV LR IRRIG 3000ML ARTHROMATIC (IV SOLUTION) ×4 IMPLANT
KIT TURNOVER KIT A (KITS) ×2 IMPLANT
MAT ABSORB  FLUID 56X50 GRAY (MISCELLANEOUS) ×2
MAT ABSORB FLUID 56X50 GRAY (MISCELLANEOUS) ×2 IMPLANT
NEPTUNE MANIFOLD (MISCELLANEOUS) ×2 IMPLANT
PACK ARTHROSCOPY KNEE (MISCELLANEOUS) ×2 IMPLANT
PAD WRAPON POLAR KNEE (MISCELLANEOUS) ×1 IMPLANT
PADDING CAST BLEND 6X4 STRL (MISCELLANEOUS) ×1 IMPLANT
PADDING STRL CAST 6IN (MISCELLANEOUS) ×1
SET TUBE SUCT SHAVER OUTFL 24K (TUBING) ×2 IMPLANT
SET TUBE TIP INTRA-ARTICULAR (MISCELLANEOUS) ×2 IMPLANT
SUT ETHILON 3-0 FS-10 30 BLK (SUTURE) ×2
SUTURE EHLN 3-0 FS-10 30 BLK (SUTURE) ×1 IMPLANT
TOWEL OR 17X26 4PK STRL BLUE (TOWEL DISPOSABLE) ×4 IMPLANT
TUBING ARTHRO INFLOW-ONLY STRL (TUBING) ×2 IMPLANT
WAND WEREWOLF FLOW 90D (MISCELLANEOUS) ×2 IMPLANT
WRAPON POLAR PAD KNEE (MISCELLANEOUS) ×2

## 2019-05-31 NOTE — Anesthesia Procedure Notes (Signed)
Procedure Name: LMA Insertion Date/Time: 05/31/2019 12:46 PM Performed by: Cameron Ali, CRNA Pre-anesthesia Checklist: Patient identified, Emergency Drugs available, Suction available, Timeout performed and Patient being monitored Patient Re-evaluated:Patient Re-evaluated prior to induction Oxygen Delivery Method: Circle system utilized Preoxygenation: Pre-oxygenation with 100% oxygen Induction Type: IV induction LMA: LMA inserted LMA Size: 4.0 Number of attempts: 1 Placement Confirmation: positive ETCO2 and breath sounds checked- equal and bilateral Tube secured with: Tape Dental Injury: Teeth and Oropharynx as per pre-operative assessment

## 2019-05-31 NOTE — Anesthesia Postprocedure Evaluation (Signed)
Anesthesia Post Note  Patient: Jeff [name].  Procedure(s) Performed: KNEE ARTHROSCOPY WITH partial  MEDIAL MENISECTOMY (Right Knee)  Patient location during evaluation: PACU Anesthesia Type: General Level of consciousness: awake and alert, oriented and patient cooperative Pain management: pain level controlled Vital Signs Assessment: post-procedure vital signs reviewed and stable Respiratory status: spontaneous breathing, nonlabored ventilation and respiratory function stable Cardiovascular status: blood pressure returned to baseline and stable Postop Assessment: adequate PO intake Anesthetic complications: no    Darrin Nipper

## 2019-05-31 NOTE — Transfer of Care (Signed)
Immediate Anesthesia Transfer of Care Note  Patient: Jeff [name].  Procedure(s) Performed: KNEE ARTHROSCOPY WITH partial  MEDIAL MENISECTOMY (Right Knee)  Patient Location: PACU  Anesthesia Type: General  Level of Consciousness: awake, alert  and patient cooperative  Airway and Oxygen Therapy: Patient Spontanous Breathing and Patient connected to supplemental oxygen  Post-op Assessment: Post-op Vital signs reviewed, Patient's Cardiovascular Status Stable, Respiratory Function Stable, Patent Airway and No signs of Nausea or vomiting  Post-op Vital Signs: Reviewed and stable  Complications: No apparent anesthesia complications

## 2019-05-31 NOTE — H&P (Signed)
Paper H&P to be scanned into permanent record. H&P reviewed. No significant changes noted.  

## 2019-05-31 NOTE — Discharge Instructions (Signed)
Arthroscopic Knee Surgery - Partial Meniscectomy   Post-Op Instructions   1. Bracing or crutches: Crutches will be provided at the time of discharge from the surgery center if you do not already have them.   2. Ice: You may be provided with a device [facility]) that allows you to ice the affected area effectively. Otherwise you can ice manually.    3. Driving:  Plan on not driving for at least one to two weeks. Please note that you are advised NOT to drive while taking narcotic pain medications as you may be impaired and unsafe to drive.   4. Activity: Ankle pumps several times an hour while awake to prevent blood clots. Weight bearing: as tolerated. Use crutches for as needed (usually ~1 week or less) until pain allows you to ambulate without a limp. Bending and straightening the knee is unlimited. Elevate knee above heart level as much as possible for one week. Avoid standing more than 5 minutes (consecutively) for the first week.  Avoid long distance travel for 2 weeks.  5. Medications:  - You have been provided a prescription for narcotic pain medicine. After surgery, take 1-2 narcotic tablets every 4 hours if needed for severe pain.  - You may take up to 3000mg /day of tylenol (acetaminophen). You can take 1000mg  3x/day. Please check your narcotic. If you have acetaminophen in your narcotic (each tablet will be 325mg ), be careful not to exceed a total of 3000mg /day of acetaminophen.  - A prescription for anti-nausea medication will be provided in case the narcotic medicine or anesthesia causes nausea - take 1 tablet every 6 hours only if nauseated.   - Resume home Xarelto on Friday, 06/01/19.    6. Bandages: The physical therapist should change the bandages at the first post-op appointment. If needed, the dressing supplies have been provided to you.   7. Physical Therapy: 1-2 times per week for 6 weeks. Therapy typically starts on post operative Day 3 or 4. You have been provided an order  for physical therapy. The therapist will provide home exercises.   8. Work: May return to full work usually around 2 weeks after 1st post-operative visit. May do light duty/desk job in approximately 1-2 weeks when off of narcotics, pain is well-controlled, and swelling has decreased. Labor intensive jobs may require 4-6 weeks to return.      9. Post-Op Appointments: Your first post-op appointment will be with Dr. Posey Pronto in approximately 2 weeks time.    If you find that they have not been scheduled please call the Orthopaedic Appointment front desk at [phone].   General Anesthesia, Adult, Care After This sheet gives you information about how to care for yourself after your procedure. Your health care provider may also give you more specific instructions. If you have problems or questions, contact your health care provider. What can I expect after the procedure? After the procedure, the following side effects are common:  Pain or discomfort at the IV site.  Nausea.  Vomiting.  Sore throat.  Trouble concentrating.  Feeling cold or chills.  Weak or tired.  Sleepiness and fatigue.  Soreness and body aches. These side effects can affect parts of the body that were not involved in surgery. Follow these instructions at home:  For at least 24 hours after the procedure:  Have a responsible adult stay with you. It is important to have someone help care for you until you are awake and alert.  Rest as needed.  Do not: ?  Participate in activities in which you could fall or become injured. ? Drive. ? Use heavy machinery. ? Drink alcohol. ? Take sleeping pills or medicines that cause drowsiness. ? Make important decisions or sign legal documents. ? Take care of children on your own. Eating and drinking  Follow any instructions from your health care provider about eating or drinking restrictions.  When you feel hungry, start by eating small amounts of foods that are soft and  easy to digest (bland), such as toast. Gradually return to your regular diet.  Drink enough fluid to keep your urine pale yellow.  If you vomit, rehydrate by drinking water, juice, or clear broth. General instructions  If you have sleep apnea, surgery and certain medicines can increase your risk for breathing problems. Follow instructions from your health care provider about wearing your sleep device: ? Anytime you are sleeping, including during daytime naps. ? While taking prescription pain medicines, sleeping medicines, or medicines that make you drowsy.  Return to your normal activities as told by your health care provider. Ask your health care provider what activities are safe for you.  Take over-the-counter and prescription medicines only as told by your health care provider.  If you smoke, do not smoke without supervision.  Keep all follow-up visits as told by your health care provider. This is important. Contact a health care provider if:  You have nausea or vomiting that does not get better with medicine.  You cannot eat or drink without vomiting.  You have pain that does not get better with medicine.  You are unable to pass urine.  You develop a skin rash.  You have a fever.  You have redness around your IV site that gets worse. Get help right away if:  You have difficulty breathing.  You have chest pain.  You have blood in your urine or stool, or you vomit blood. Summary  After the procedure, it is common to have a sore throat or nausea. It is also common to feel tired.  Have a responsible adult stay with you for the first 24 hours after general anesthesia. It is important to have someone help care for you until you are awake and alert.  When you feel hungry, start by eating small amounts of foods that are soft and easy to digest (bland), such as toast. Gradually return to your regular diet.  Drink enough fluid to keep your urine pale yellow.  Return to  your normal activities as told by your health care provider. Ask your health care provider what activities are safe for you. This information is not intended to replace advice given to you by your health care provider. Make sure you discuss any questions you have with your health care provider. Document Released: 01/31/2001 Document Revised: 10/28/2017 Document Reviewed: 06/10/2017 Elsevier Patient Education  2020 [employer]

## 2019-05-31 NOTE — Op Note (Signed)
Operative Note    SURGERY DATE: 05/31/2019   PRE-OP DIAGNOSIS:  1. Right medial meniscus root tear 2. Right knee degenerative changes   POST-OP DIAGNOSIS:     1. Right medial meniscus root tear 2. Right patella degenerative changes   PROCEDURES:  1. Right knee arthroscopy, partial medial meniscectomy 2.  Right knee chondroplasty of medial, lateral, and patellofemoral compartments   SURGEON: Rosealee AlbeeSunny H. Aprile Dickenson, MD   ANESTHESIA: Gen   ESTIMATED BLOOD LOSS: minimal   TOTAL IV FLUIDS: per anesthesia   INDICATION(S):  Jeff PiedraNed Jeff Marella BileRoney Jr. is a 68 y.o. male with signs and symptoms as well as MRI finding of medial meniscus root tear.  He has some degenerative changes of his knee with joint space loss of the medial compartment and tricompartmental areas of chondral pathology and osteophyte formation.  He underwent a trial of nonoperative management of his injury without symptomatic relief.  We discussed the possibility of knee arthroplasty and the patient refused the surgery and preferred to undergo an arthroscopic procedure to see if this allowed for improvement in his symptoms.  He understands that given his baseline level of arthritis, he may not get complete relief, but would be satisfied with relief of mechanical symptoms.  After discussion of risks, benefits, and alternatives to surgery, the patient elected to proceed.   OPERATIVE FINDINGS:    Examination under anesthesia: A careful examination under anesthesia was performed.  Passive range of motion was: Hyperextension: 1.  Extension: 0.  Flexion: 120.  Lachman: normal. Pivot Shift: normal.  Posterior drawer: normal.  Varus stability in full extension: normal.  Varus stability in 30 degrees of flexion: normal.  Valgus stability in full extension: normal.  Valgus stability in 30 degrees of flexion: normal.   Intra-operative findings: A thorough arthroscopic examination of the knee was performed.  The findings are: 1. Suprapatellar pouch:  Normal 2. Undersurface of median ridge: Grade 1-2 degenerative changes 3. Medial patellar facet: Grade 1-2 degenerative changes 4. Lateral patellar facet: Grade 1-2 degenerative changes 5. Trochlea: Grade 2-3 degenerative changes centrally 6. Lateral gutter/popliteus tendon: Normal, lateral femoral condyle osteophyte visualized 7. Hoffa's fat pad: Inflamed 8. Medial gutter/plica: Normal 9. ACL: Normal 10. PCL: Normal 11. Medial meniscus: Complete tear of the meniscus root 12. Medial compartment cartilage: Large area of grade 4 degenerative changes of the medial femoral condyle measuring approximately 15 x 20 mm with surrounding grade 1-2 degenerative changes; central area of grade 3-4 degenerative changes of the tibial plateau measuring approximately 20 x 20 mm 13. Lateral meniscus: Normal 14. Lateral compartment cartilage:  Large area of approximately 15 x 20 grade 4 degenerative changes of the lateral femoral condyle with surrounding grade 1 softening.  Grade 1-2 degenerative changes of the lateral tibial plateau.   OPERATIVE REPORT:     I identified [employer]ed Jeff Fluke Jr. in the pre-operative holding area. I marked the operative knee with my initials. I reviewed the risks and benefits of the proposed surgical intervention and the patient (and/or patient's guardian) wished to proceed. The patient was transferred to the operative suite and placed in the supine position with all bony prominences padded.  Anesthesia was administered. Appropriate IV antibiotics were administered prior to incision. The extremity was then prepped and draped in standard fashion. A time out was performed confirming the correct extremity, correct patient, and correct procedure.   Arthroscopy portals were marked. Local anesthetic was injected to the planned portal sites. The anterolateral portal was established with an 11 blade.  The arthroscope was placed in the anterolateral portal and then into the suprapatellar  pouch.  A diagnostic knee scope was completed with the above findings. The medial meniscus tear was identified.   Next the medial portal was established under needle localization. The MCL was pie-crusted to improve visualization of the posterior horn. The meniscal tear was debrided using an arthroscopic biter and an oscillating shaver until the meniscus had stable borders and could no longer easily be probed anteriorly into the joint.  A chondroplasty was performed of the medial compartment, lateral compartment, and patellofemoral compartment such that there were stable cartilage edges without any loose fragments of cartilage. Arthroscopic fluid was removed from the joint.   The portals were closed with 3-0 Nylon suture. Sterile dressings included Xeroform, 4x4s, Sof-Rol, and Bias wrap. A Polarcare was placed.  The patient was then awakened and taken to the PACU hemodynamically stable without complication.     POSTOPERATIVE PLAN: The patient will be discharged home today once they meet PACU criteria.  He will resume home Xarelto starting on postoperative day #1. Physical therapy will start on POD#3-4. Weight-bearing as tolerated. Follow up in 2 weeks per protocol.

## 2019-05-31 NOTE — Anesthesia Preprocedure Evaluation (Addendum)
Anesthesia Evaluation  Patient identified by MRN, date of birth, ID band Patient awake    Reviewed: Allergy & Precautions, NPO status , Patient's Chart, lab work & pertinent test results  History of Anesthesia Complications Negative for: history of anesthetic complications  Airway Mallampati: IV   Neck ROM: Full    Dental  (+)    Pulmonary neg pulmonary ROS,    Pulmonary exam normal breath sounds clear to auscultation       Cardiovascular hypertension, + Peripheral Vascular Disease (s/p IVC stent 05/25/19 on Xarelto, currently bridging with lovenox, last dose 05/30/19 in AM) and +CHF  + dysrhythmias (a fib)  Rhythm:Irregular Rate:Normal  Congested dilated cardiomyopathy; Hx DVT  ECG 01/06/19: atrial fibrillation, HR 81   Neuro/Psych negative neurological ROS     GI/Hepatic negative GI ROS,   Endo/Other  diabetes, Type 2  Renal/GU negative Renal ROS     Musculoskeletal   Abdominal   Peds  Hematology negative hematology ROS (+)   Anesthesia Other Findings [facility] note 05/30/19:  Telephone Encounter - Jamse Belfastrescott, Jessica Lime LakeReavis, PA - 05/30/2019 5:55 PM EDT Patient is s/p IVC venogram with angioplasty and stent extension on 05/25/2019. Plan for holding Xarelto and starting lovenox bridge starting Monday 05/28/2019. Hold lovenox dose the PM before surgery and the am dose after surgery (scheduled 05/31/2019) with plans to resume Xarelto Friday 06/01/2019. Dr. Allena KatzPatel, Dr. Cindie CrumblyPabon-Ramos, Anesthesia and the patient are all agreeable to this plan.   Electronically signed by Wynetta EmeryPrescott, Jessica Reavis, PA at 05/30/2019 5:59 PM EDT    Cardiology note 03/08/19:  ASSESSMENT & PLAN:   Atrial fibrillation, unspecified type (HCC) Permanent atrial fibrillation, he will start monitoring his rate more closely Was previously discharged on diltiazem 240 daily when he was in the hospital October 2020 That was in the setting of sepsis, likely had  elevated rate in the setting of infection/cellulitis Reports now he is only taking diltiazem 120 daily and rate is adequate He will stay on Xarelto permanently  Congestive dilated cardiomyopathy (HCC) Ejection fraction up to 50 to 55% No medication changes made Reports he feels euvolemic  Type 2 diabetes mellitus with other circulatory complication, without long-term current use of insulin (HCC) We have encouraged continued exercise, careful diet management in an effort to lose weight.  Most recent hemoglobin A1c down to 6.9 previously and 7.8 range  HYPERTENSION, BENIGN Blood pressure typically 130/80, he will continue to monitor pressures  Mixed hyperlipidemia Numbers at goal We will watch LFTs  Acute renal failure, unspecified acute renal failure type (HCC) Long history of diabetes, creatinine stable 1.4  COVID-19 Education: The signs and symptoms of COVID-19 were discussed with the patient and how to seek care for testing (follow up with PCP or arrange E-visit).  The importance of social distancing was discussed today.  Patient Risk:   After full review of this patients clinical status, I feel that they are at least moderate risk at this time.   Reproductive/Obstetrics                            Anesthesia Physical Anesthesia Plan  ASA: IV  Anesthesia Plan: General   Post-op Pain Management:    Induction: Intravenous  PONV Risk Score and Plan: 2 and Dexamethasone and Ondansetron  Airway Management Planned: LMA  Additional Equipment:   Intra-op Plan:   Post-operative Plan: Extubation in OR  Informed Consent: I have reviewed the patients History and Physical, chart,  labs and discussed the procedure including the risks, benefits and alternatives for the proposed anesthesia with the patient or authorized representative who has indicated his/her understanding and acceptance.       Plan Discussed with: CRNA  Anesthesia Plan  Comments:        Anesthesia Quick Evaluation

## 2019-06-01 ENCOUNTER — Encounter: Payer: Self-pay | Admitting: Orthopedic Surgery

## 2019-06-01 ENCOUNTER — Telehealth: Payer: Self-pay | Admitting: Cardiovascular Disease

## 2019-06-01 NOTE — Telephone Encounter (Signed)
Pt c/o medication issue:  1. Name of Medication: Singulair and xarelto   2. How are you currently taking this medication (dosage and times per day)? Insurance told patient not to take singulair  because of heart condition and xarel[place] affordale [place]   3. Are you having a reaction (difficulty breathing--STAT)? No   4. What is your medication issue? Please advise

## 2019-06-01 NOTE — Telephone Encounter (Signed)
Attempted to call the patient. No answer- I left a message to please call back.  

## 2019-06-01 NOTE — Telephone Encounter (Signed)
To Dr. Rockey Situ pharmacy to review regarding singular prior to calling the patient back.

## 2019-06-01 NOTE — Telephone Encounter (Signed)
There is no cardiovascular concern with Singulair, I'm not sure why his insurance would have told him this. Pt could try applying for patient assistance for Xarelto [place]:  [email or web address]

## 2019-06-04 DIAGNOSIS — M6281 Muscle weakness (generalized): Secondary | ICD-10-CM | POA: Diagnosis not present

## 2019-06-04 DIAGNOSIS — G8929 Other chronic pain: Secondary | ICD-10-CM | POA: Diagnosis not present

## 2019-06-04 DIAGNOSIS — M25661 Stiffness of right knee, not elsewhere classified: Secondary | ICD-10-CM | POA: Diagnosis not present

## 2019-06-04 DIAGNOSIS — M25561 Pain in right knee: Secondary | ICD-10-CM | POA: Diagnosis not present

## 2019-06-04 NOTE — Telephone Encounter (Signed)
Called patient. He verbalized understanding that it is ok to take Singular. He said he has already received patient assistance and will be able to get a one month supply for $85. He needs a few samples to carry him over until it arrives.   He is aware I will leave samples at front desk for him to pick up.   Drug name: Ruben Gottron       Strength: 20mg         Qty: 2 bottldles  LOT: [id number]  Exp.Date: 02/2021

## 2019-06-08 DIAGNOSIS — M25561 Pain in right knee: Secondary | ICD-10-CM | POA: Diagnosis not present

## 2019-06-08 DIAGNOSIS — G8929 Other chronic pain: Secondary | ICD-10-CM | POA: Diagnosis not present

## 2019-06-13 DIAGNOSIS — M25561 Pain in right knee: Secondary | ICD-10-CM | POA: Diagnosis not present

## 2019-06-13 DIAGNOSIS — G8929 Other chronic pain: Secondary | ICD-10-CM | POA: Diagnosis not present

## 2019-06-13 DIAGNOSIS — M6281 Muscle weakness (generalized): Secondary | ICD-10-CM | POA: Diagnosis not present

## 2019-06-13 DIAGNOSIS — M25661 Stiffness of right knee, not elsewhere classified: Secondary | ICD-10-CM | POA: Diagnosis not present

## 2019-06-18 ENCOUNTER — Other Ambulatory Visit: Payer: Self-pay | Admitting: Family Medicine

## 2019-06-18 DIAGNOSIS — E782 Mixed hyperlipidemia: Secondary | ICD-10-CM

## 2019-07-02 ENCOUNTER — Other Ambulatory Visit: Payer: Self-pay

## 2019-07-02 DIAGNOSIS — R6889 Other general symptoms and signs: Secondary | ICD-10-CM | POA: Diagnosis not present

## 2019-07-02 DIAGNOSIS — Z20822 Contact with and (suspected) exposure to covid-19: Secondary | ICD-10-CM

## 2019-07-03 LAB — NOVEL CORONAVIRUS, NAA: SARS-CoV-2, NAA: NOT DETECTED

## 2019-07-29 ENCOUNTER — Other Ambulatory Visit: Payer: Self-pay | Admitting: Family Medicine

## 2019-07-30 NOTE — Telephone Encounter (Signed)
Requested medication (s) are due for refill today: yes  Requested medication (s) are on the active medication list: yes  Last refill: 06/22/2019  Future visit scheduled: no  Notes to clinic:  Review for refill   Requested Prescriptions  Pending Prescriptions Disp Refills   benazepril (LOTENSIN) 40 MG tablet [Pharmacy Med Name: BENAZEPRIL HCL 40 MG TABLET] 90 tablet 1    Sig: TAKE 1 TABLET BY MOUTH EVERY DAY     Cardiovascular:  ACE Inhibitors Failed - 07/29/2019  9:35 AM      Failed - Cr in normal range and within 180 days    Creatinine  Date Value Ref Range Status  04/06/2013 0.83 0.60 - 1.30 mg/dL Final   Creatinine, Ser  Date Value Ref Range Status  01/06/2019 1.18 0.61 - 1.24 mg/dL Final         Failed - K in normal range and within 180 days    Potassium  Date Value Ref Range Status  01/06/2019 4.4 3.5 - 5.1 mmol/L Final  04/06/2013 4.0 3.5 - 5.1 mmol/L Final         Failed - Last BP in normal range    BP Readings from Last 1 Encounters:  05/31/19 (!) 127/92         Passed - Patient is not pregnant      Passed - Valid encounter within last 6 months    Recent Outpatient Visits          8 months ago Chronic sinusitis, unspecified location   [facility], [place], [place]   10 months ago Need for influenza vaccination   [facility] [place] Guadalupe Maple, [place]   10 months ago HYPERTENSION, BENIGN   [facility] Crissman, Jeannette How, MD   1 year ago Non-insulin dependent type 2 diabetes mellitus ([place])   [facility] Crissman, Jeannette How, MD   1 year ago Advanced care planning/counseling discussion   [facility] Crissman, Jeannette How, MD

## 2019-07-30 NOTE — Telephone Encounter (Signed)
Routing to provider  

## 2019-08-11 ENCOUNTER — Emergency Department
Admission: EM | Admit: 2019-08-11 | Discharge: 2019-08-11 | Disposition: A | Discharge status: Home / Self Care | Payer: Medicare HMO | Attending: Student | Admitting: Student

## 2019-08-11 ENCOUNTER — Other Ambulatory Visit: Payer: Self-pay

## 2019-08-11 ENCOUNTER — Emergency Department: Payer: Medicare HMO

## 2019-08-11 ENCOUNTER — Encounter: Payer: Self-pay | Admitting: Emergency Medicine

## 2019-08-11 DIAGNOSIS — I4821 Permanent atrial fibrillation: Secondary | ICD-10-CM | POA: Insufficient documentation

## 2019-08-11 DIAGNOSIS — R05 Cough: Secondary | ICD-10-CM

## 2019-08-11 DIAGNOSIS — E119 Type 2 diabetes mellitus without complications: Secondary | ICD-10-CM | POA: Insufficient documentation

## 2019-08-11 DIAGNOSIS — Z20822 Contact with and (suspected) exposure to covid-19: Secondary | ICD-10-CM

## 2019-08-11 DIAGNOSIS — I110 Hypertensive heart disease with heart failure: Secondary | ICD-10-CM | POA: Insufficient documentation

## 2019-08-11 DIAGNOSIS — R0789 Other chest pain: Secondary | ICD-10-CM | POA: Diagnosis present

## 2019-08-11 DIAGNOSIS — Z7984 Long term (current) use of oral hypoglycemic drugs: Secondary | ICD-10-CM | POA: Insufficient documentation

## 2019-08-11 DIAGNOSIS — R0602 Shortness of breath: Secondary | ICD-10-CM | POA: Diagnosis not present

## 2019-08-11 DIAGNOSIS — R059 Cough, unspecified: Secondary | ICD-10-CM

## 2019-08-11 DIAGNOSIS — I5022 Chronic systolic (congestive) heart failure: Secondary | ICD-10-CM | POA: Insufficient documentation

## 2019-08-11 DIAGNOSIS — U071 COVID-19: Secondary | ICD-10-CM | POA: Diagnosis not present

## 2019-08-11 DIAGNOSIS — Z7901 Long term (current) use of anticoagulants: Secondary | ICD-10-CM | POA: Diagnosis not present

## 2019-08-11 DIAGNOSIS — Z79899 Other long term (current) drug therapy: Secondary | ICD-10-CM | POA: Insufficient documentation

## 2019-08-11 DIAGNOSIS — A419 Sepsis, unspecified organism: Secondary | ICD-10-CM

## 2019-08-11 DIAGNOSIS — R079 Chest pain, unspecified: Secondary | ICD-10-CM | POA: Diagnosis not present

## 2019-08-11 LAB — COMPREHENSIVE METABOLIC PANEL
ALT: 18 U/L (ref 0–44)
AST: 29 U/L (ref 15–41)
Albumin: 4.2 g/dL (ref 3.5–5.0)
Alkaline Phosphatase: 61 U/L (ref 38–126)
Anion gap: 10 (ref 5–15)
BUN: 20 mg/dL (ref 8–23)
CO2: 24 mmol/L (ref 22–32)
Calcium: 8.6 mg/dL — ABNORMAL LOW (ref 8.9–10.3)
Chloride: 101 mmol/L (ref 98–111)
Creatinine, Ser: 1.41 mg/dL — ABNORMAL HIGH (ref 0.61–1.24)
GFR calc Af Amer: 59 mL/min — ABNORMAL LOW (ref >60)
GFR calc non Af Amer: 51 mL/min — ABNORMAL LOW (ref >60)
Glucose, Bld: 228 mg/dL — ABNORMAL HIGH (ref 70–99)
Potassium: 4.6 mmol/L (ref 3.5–5.1)
Sodium: 135 mmol/L (ref 135–145)
Total Bilirubin: 1.3 mg/dL — ABNORMAL HIGH (ref 0.3–1.2)
Total Protein: 7 g/dL (ref 6.5–8.1)

## 2019-08-11 LAB — PROTIME-INR
INR: 1.4 — ABNORMAL HIGH (ref 0.8–1.2)
Prothrombin Time: 16.8 seconds — ABNORMAL HIGH (ref 11.4–15.2)

## 2019-08-11 LAB — URINALYSIS, COMPLETE (UACMP) WITH MICROSCOPIC
Bacteria, UA: NONE SEEN
Bilirubin Urine: NEGATIVE
Glucose, UA: >=500 mg/dL — AB
Ketones, ur: 20 mg/dL — AB
Leukocytes,Ua: NEGATIVE
Nitrite: NEGATIVE
Protein, ur: 100 mg/dL — AB
Specific Gravity, Urine: 1.023 (ref 1.005–1.030)
Squamous Epithelial / HPF: NONE SEEN (ref 0–5)
pH: 5 (ref 5.0–8.0)

## 2019-08-11 LAB — CBC WITH DIFFERENTIAL/PLATELET
Abs Immature Granulocytes: 0.07 10*3/uL (ref 0.00–0.07)
Basophils Absolute: 0 10*3/uL (ref 0.0–0.1)
Basophils Relative: 1 %
Eosinophils Absolute: 0 10*3/uL (ref 0.0–0.5)
Eosinophils Relative: 0 %
HCT: 49.3 % (ref 39.0–52.0)
Hemoglobin: 16.1 g/dL (ref 13.0–17.0)
Immature Granulocytes: 1 %
Lymphocytes Relative: 8 %
Lymphs Abs: 0.5 10*3/uL — ABNORMAL LOW (ref 0.7–4.0)
MCH: 29.8 pg (ref 26.0–34.0)
MCHC: 32.7 g/dL (ref 30.0–36.0)
MCV: 91.1 fL (ref 80.0–100.0)
Monocytes Absolute: 0.7 10*3/uL (ref 0.1–1.0)
Monocytes Relative: 10 %
Neutro Abs: 5.3 10*3/uL (ref 1.7–7.7)
Neutrophils Relative %: 80 %
Platelets: 169 10*3/uL (ref 150–400)
RBC: 5.41 MIL/uL (ref 4.22–5.81)
RDW: 13.2 % (ref 11.5–15.5)
WBC: 6.7 10*3/uL (ref 4.0–10.5)
nRBC: 0 % (ref 0.0–0.2)

## 2019-08-11 LAB — LACTIC ACID, PLASMA: Lactic Acid, Venous: 1.7 mmol/L (ref 0.5–1.9)

## 2019-08-11 LAB — BRAIN NATRIURETIC PEPTIDE: B Natriuretic Peptide: 209 pg/mL — ABNORMAL HIGH (ref 0.0–100.0)

## 2019-08-11 LAB — TROPONIN I (HIGH SENSITIVITY)
Troponin I (High Sensitivity): 10 ng/L (ref <18)
Troponin I (High Sensitivity): 11 ng/L (ref <18)

## 2019-08-11 MED ORDER — SODIUM CHLORIDE 0.9 % IV BOLUS
1000.0000 mL | Freq: Once | INTRAVENOUS | Status: AC
  Administered 2019-08-11: 1000 mL via INTRAVENOUS

## 2019-08-11 MED ORDER — ALBUTEROL SULFATE HFA 108 (90 BASE) MCG/ACT IN AERS
2.0000 | INHALATION_SPRAY | Freq: Once | RESPIRATORY_TRACT | Status: AC
  Administered 2019-08-11: 2 via RESPIRATORY_TRACT
  Filled 2019-08-11: qty 6.7

## 2019-08-11 MED ORDER — ALBUTEROL SULFATE (2.5 MG/3ML) 0.083% IN NEBU: 2.5000 mg | INHALATION_SOLUTION | Freq: Once | RESPIRATORY_TRACT | Status: DC

## 2019-08-11 MED ORDER — METOPROLOL TARTRATE 50 MG PO TABS
50.0000 mg | ORAL_TABLET | Freq: Once | ORAL | Status: AC
  Administered 2019-08-11: 50 mg via ORAL
  Filled 2019-08-11: qty 1

## 2019-08-11 MED ORDER — ALBUTEROL SULFATE HFA 108 (90 BASE) MCG/ACT IN AERS: 2.0000 | INHALATION_SPRAY | RESPIRATORY_TRACT | 0 refills | Status: DC | PRN

## 2019-08-11 MED ORDER — ACETAMINOPHEN 500 MG PO TABS
1000.0000 mg | ORAL_TABLET | Freq: Once | ORAL | Status: AC
  Administered 2019-08-11: 1000 mg via ORAL
  Filled 2019-08-11: qty 2

## 2019-08-11 MED ORDER — PREDNISONE 50 MG PO TABS: 50.0000 mg | ORAL_TABLET | Freq: Every day | ORAL | 0 refills | Status: DC

## 2019-08-11 MED ORDER — BENZONATATE 100 MG PO CAPS: 100.0000 mg | ORAL_CAPSULE | Freq: Four times a day (QID) | ORAL | 0 refills | Status: DC | PRN

## 2019-08-11 MED ORDER — METHYLPREDNISOLONE SODIUM SUCC 125 MG IJ SOLR
125.0000 mg | Freq: Once | INTRAMUSCULAR | Status: AC
  Administered 2019-08-11: 125 mg via INTRAVENOUS
  Filled 2019-08-11: qty 2

## 2019-08-11 NOTE — ED Triage Notes (Signed)
Pt arrives POV to triage with c/o chest pain since yesterday morning at 0400. Pt is experiencing a hard work of breathing at this time with diminished lung sounds in the lower lobes. Pt is febrile at this time and states that he took cough syrup around 1600. Pt is able to talk in full sentences at this time.

## 2019-08-11 NOTE — Discharge Instructions (Addendum)
Thank you for letting us take care of you in the emergency department.   At this time, your coronavirus swab results are pending. You should hear your results in 1-2 if they are positive.   In the meantime, it is important to take precautions in case you are positive. This includes quarantining, wearing a mask, and social distancing.   Continue to take over the counter acetaminophen as directed on the box to help with fevers as well as aches and pains.   Please return to the ER for any new or worsening symptoms, such as difficulty breathing, vomiting and diarrhea, or chest pain.

## 2019-08-11 NOTE — ED Notes (Signed)
Pt given water to drink as allowed by MD: urinal emptied of clear yellow urine; call bell in reach; pt understands waiting on repeat troponin results and MD followup

## 2019-08-11 NOTE — ED Provider Notes (Signed)
[facility] Emergency Department Provider Note  ____________________________________________  Time seen: Approximately 8:15 PM  I have reviewed the triage vital signs and the nursing notes.   HISTORY  Chief Complaint Chest Pain    HPI Jeff [name]. is a 68 y.o. male who presents the emergency department complaining of chest tightness, shortness of breath.  Patient reports that he woke up yesterday morning around 4:00, felt short of breath and was coughing.  Patient states that he had a cough syrup prescription from a previous illness and has been taking that yesterday.  Throughout today he started feeling like his chest was "tight."  He denies any substernal chest pain.  Patient does have a history of systolic heart failure, diabetes, hypertension, A. fib.  Patient denies any pain associated with previous episodes with his "heart."  Patient states that it is more of a respiratory chest tightness.  This patient started to have increased work of breathing who presents the emergency department for evaluation.  He has had a temperature up to 100.2 F.  He denies any nasal congestion or sore throat.  He does endorse cough, shortness of breath but cough is nonproductive.  Some nausea but no emesis.  No abdominal pain.  No diarrhea or constipation.  Other than cough medication, no medications prior to arrival.  Patient with medical history of chronic DVT, chronic systolic heart failure, diabetes, hyperlipidemia, hypertension, A. Fib.         Past Medical History:  Diagnosis Date  . Chronic deep vein thrombosis (DVT) (HCC)   . Chronic low back pain   . Chronic systolic heart failure ([place])    a. TTE 2011 with EF 40-45% per notes  . Diabetes mellitus without complication ([place])   . Erectile dysfunction   . HLD (hyperlipidemia)   . Hypertension   . Hypogonadism in male   . Permanent atrial fibrillation ([place])    a. s/p DCCV 2011; b. redeveloped Afib in 2016; c.  CHADS2VASc at least 5 (CHF, HTN, age x 1, DM, vascular disease); d. on Xarelto  . Torn meniscus    right knee    Patient Active Problem List   Diagnosis Date Noted  . Elevated hemoglobin ([place]) 09/07/2018  . Inferior vena caval stenosis 05/22/2018  . 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 Morbid obesity ([place]) 01/04/2018  . Chronic sinusitis 12/29/2017  . History of DVT (deep vein thrombosis) 12/17/2017  . Acute deep vein thrombosis (DVT) of femoral vein of left lower extremity ([place]) 11/28/2017  . BPH (benign prostatic hyperplasia) 12/20/2016  . Advanced care planning/counseling discussion   . Erectile dysfunction 03/29/2015  . Hypogonadism in male 03/29/2015  . Hyperlipidemia 02/02/2013  . Non-insulin dependent type 2 diabetes mellitus ([place]) 02/02/2013  . DVT of lower limb, acute ([place]) 11/08/2012  . EDEMA 06/03/2010  . HYPERTENSION, BENIGN 02/23/2010  . Congestive dilated cardiomyopathy ([place]) 02/23/2010  . A-fib ([place]) 02/20/2010    Past Surgical History:  Procedure Laterality Date  . COLONOSCOPY WITH PROPOFOL N/A 05/28/2016   Procedure: COLONOSCOPY WITH PROPOFOL;  Surgeon: Lucilla Lame, MD;  Location: [place];  Service: Endoscopy;  Laterality: N/A;  diabetic - oral meds  . DVT, leg Left   . INSERTION OF VENA CAVA FILTER  2014  . KNEE ARTHROSCOPY WITH MEDIAL MENISECTOMY Right 05/31/2019   Procedure: KNEE ARTHROSCOPY WITH partial  MEDIAL MENISECTOMY;  Surgeon: Leim Fabry, MD;  Location: [place];  Service: Orthopedics;  Laterality: Right;  . PERIPHERAL VASCULAR THROMBECTOMY  Left 12/01/2017   Procedure: PERIPHERAL VASCULAR THROMBECTOMY;  Surgeon: Renford Dills, MD;  Location: [facility] INVASIVE CV LAB;  Service: Cardiovascular;  Laterality: Left;    Prior to Admission medications   Medication Sig Start Date End Date Taking? Authorizing Provider  acetaminophen (TYLENOL) 500 MG tablet Take 2 tablets (1,000 mg total) by mouth every 8 (eight) hours. 05/31/19 05/30/20   Signa Kell, MD  aspirin 325 MG tablet Take 325 mg by mouth daily.    [provider]  atorvastatin (LIPITOR) 20 MG tablet TAKE 1/2 TABLET BY MOUTH EVERY DAY 06/18/19   Steele Sizer, MD  benazepril (LOTENSIN) 40 MG tablet TAKE 1 TABLET BY MOUTH EVERY DAY 07/30/19   Steele Sizer, MD  chlorpheniramine-HYDROcodone (TUSSIONEX PENNKINETIC ER) 10-8 MG/5ML SUER Take 2.5-5 mLs by mouth every 12 (twelve) hours as needed for cough. Patient not taking: Reported on 05/10/2019 11/03/18   Particia Nearing, PA-C  diltiazem (CARDIZEM CD) 120 MG 24 hr capsule Take 1 capsule (120 mg total) by mouth daily. 08/19/18 08/19/19  Adrian Saran, MD  fluticasone (FLONASE) 50 MCG/ACT nasal spray Place into both nostrils daily as needed for allergies or rhinitis.    [provider]  glucose blood (CONTOUR NEXT TEST) test strip Use as instructed 12/12/17   Steele Sizer, MD  guaiFENesin (MUCINEX PO) Take by mouth as needed.    [provider]  HYDROcodone-acetaminophen (NORCO) 5-325 MG tablet Take 1-2 tablets by mouth every 4 (four) hours as needed for moderate pain or severe pain. 05/31/19   Signa Kell, MD  metFORMIN (GLUCOPHAGE) 1000 MG tablet TAKE 1 TABLET (1,000 MG TOTAL) BY MOUTH 2 (TWO) TIMES DAILY WITH A MEAL. 04/30/19   Steele Sizer, MD  metoprolol tartrate (LOPRESSOR) 50 MG tablet TAKE 1 TABLET BY MOUTH TWICE A DAY 05/20/19   Crissman, Redge Gainer, MD  montelukast (SINGULAIR) 10 MG tablet Take 10 mg by mouth daily.  08/13/18   [provider]  ondansetron (ZOFRAN ODT) 4 MG disintegrating tablet Take 1 tablet (4 mg total) by mouth every 8 (eight) hours as needed for nausea or vomiting. 05/31/19   Signa Kell, MD  rivaroxaban (XARELTO) 20 MG TABS tablet Take 1 tablet (20 mg total) by mouth daily. 05/21/19   Antonieta Iba, MD    Allergies Patient has no known allergies.  Family History  Problem Relation Age of Onset  . Lymphoma Mother   . Heart failure Father   .  Diabetes Father   . Diabetes Mellitus II Other   . Heart attack Neg Hx   . Hypertension Neg Hx   . Cancer Neg Hx   . COPD Neg Hx   . Stroke Neg Hx   . Prostate cancer Neg Hx   . Kidney cancer Neg Hx   . Bladder Cancer Neg Hx     Social History Social History   Tobacco Use  . Smoking status: Never Smoker  . Smokeless tobacco: Never Used  Substance Use Topics  . Alcohol use: No    Alcohol/week: 0.0 standard drinks  . Drug use: No     Review of Systems  Constitutional: No fever/chills Eyes: No visual changes. No discharge ENT: No upper respiratory complaints. Cardiovascular: no chest pain. Respiratory: Positive for cough, shortness of breath, "chest tightness." Gastrointestinal: No abdominal pain.  No nausea, no vomiting.  No diarrhea.  No constipation. Musculoskeletal: Negative for musculoskeletal pain. Skin: Negative for rash, abrasions, lacerations, ecchymosis. Neurological: Negative for headaches, focal weakness  or numbness. 10-point ROS otherwise negative.  ____________________________________________   PHYSICAL EXAM:  VITAL SIGNS: ED Triage Vitals  Enc Vitals Group     BP 08/11/19 1938 (!) 160/100     Pulse Rate 08/11/19 1938 (!) 121     Resp 08/11/19 1938 (!) 24     Temp 08/11/19 1938 100.2 F (37.9 C)     Temp src --      SpO2 08/11/19 1938 95 %     Weight 08/11/19 1939 275 lb (124.7 kg)     Height 08/11/19 1939  (1.778 m)     Head Circumference --      Peak Flow --      Pain Score 08/11/19 1939 7     Pain Loc --      Pain Edu? --      Excl. in GC? --      Constitutional: Alert and oriented. Well appearing and in no acute distress. Eyes: Conjunctivae are normal. PERRL. EOMI. Head: Atraumatic. ENT:      Ears:       Nose: No congestion/rhinnorhea.      Mouth/Throat: Mucous membranes are moist.  No oropharyngeal erythema or edema. Neck: No stridor.   Hematological/Lymphatic/Immunilogical: No cervical lymphadenopathy. Cardiovascular:  Normal rate, regular rhythm. Normal S1 and S2.  Good peripheral circulation. Respiratory: Normal respiratory effort without tachypnea or retractions. Lungs CTAB with no frank wheezing, rales or rhonchi.Peri [place] air entry to the bases with only minimally decreased but no absent breath sounds. Gastrointestinal: Bowel sounds 4 quadrants. Soft and nontender to palpation. No guarding or rigidity. No palpable masses. No distention. No CVA tenderness. Musculoskeletal: Full range of motion to all extremities. No gross deformities appreciated. Neurologic:  Normal speech and language. No gross focal neurologic deficits are appreciated.  Skin:  Skin is warm, dry and intact. No rash noted. Psychiatric: Mood and affect are normal. Speech and behavior are normal. Patient exhibits appropriate insight and judgement.   ____________________________________________   LABS (all labs ordered are listed, but only abnormal results are displayed)  Labs Reviewed  COMPREHENSIVE METABOLIC PANEL - Abnormal; Notable for the following components:      Result Value   Glucose, Bld 228 (*)    Creatinine, Ser 1.41 (*)    Calcium 8.6 (*)    Total Bilirubin 1.3 (*)    GFR calc non Af Amer 51 (*)    GFR calc Af Amer 59 (*)    All other components within normal limits  CBC WITH DIFFERENTIAL/PLATELET - Abnormal; Notable for the following components:   Lymphs Abs 0.5 (*)    All other components within normal limits  PROTIME-INR - Abnormal; Notable for the following components:   Prothrombin Time 16.8 (*)    INR 1.4 (*)    All other components within normal limits  URINALYSIS, COMPLETE (UACMP) WITH MICROSCOPIC - Abnormal; Notable for the following components:   Color, Urine YELLOW (*)    APPearance CLEAR (*)    Glucose, UA >=500 (*)    Hgb urine dipstick SMALL (*)    Ketones, ur 20 (*)    Protein, ur 100 (*)    All other components within normal limits  BRAIN NATRIURETIC PEPTIDE - Abnormal; Notable for the following  components:   B Natriuretic Peptide 209.0 (*)    All other components within normal limits  CULTURE, BLOOD (ROUTINE X 2)  SARS CORONAVIRUS 2 (TAT 6-24 HRS)  LACTIC ACID, PLASMA  TROPONIN I (HIGH SENSITIVITY)   ____________________________________________  EKG  ED ECG REPORT I, Delorise Royals Kapri Nero,  personally viewed and interpreted this ECG.   Date: 08/11/2019  EKG Time: 1927 hrs.  Rate: 114 bpm  Rhythm: unchanged from previous tracings, atrial fibrillation, rate 114, occasional PVC  Axis: Left axis deviation  Intervals:none  ST&T Change: No ST elevations or depressions noted.  A. fib at a rate of 114.  Left axis deviation, occasional PVC.  No significant changes from prior EKG.  No STEMI.  ____________________________________________  RADIOLOGY I personally viewed and evaluated these images as part of my medical decision making, as well as reviewing the written report by the radiologist.  Dg Chest Port 1 View  Result Date: 08/11/2019 CLINICAL DATA:  Chest pain since yesterday morning. Diminished lung sounds in the lower lobes. Possible sepsis. EXAM: PORTABLE CHEST 1 VIEW COMPARISON:  01/06/2019 FINDINGS: Stable enlarged cardiac silhouette and tortuous and partially calcified thoracic aorta. Clear lungs. Mild prominence of the upper lobe pulmonary vascularity with progression. No pleural fluid. Thoracic spine degenerative changes. IMPRESSION: Stable cardiomegaly with interval mild pulmonary vascular congestion. Electronically Signed   By: Beckie Salts M.D.   On: 08/11/2019 20:24    ____________________________________________    PROCEDURES  Procedure(s) performed:    Procedures    Medications  methylPREDNISolone sodium succinate (SOLU-MEDROL) 125 mg/2 mL injection 125 mg (125 mg Intravenous Given 08/11/19 2050)  sodium chloride 0.9 % bolus 1,000 mL (1,000 mLs Intravenous New Bag/Given 08/11/19 2050)  albuterol (VENTOLIN HFA) 108 (90 Base) MCG/ACT inhaler 2 puff (2  puffs Inhalation Given 08/11/19 2103)     ____________________________________________   INITIAL IMPRESSION / ASSESSMENT AND PLAN / ED COURSE  Pertinent labs & imaging results that were available during my care of the patient were reviewed by me and considered in my medical decision making (see chart for details).  Review of the [place] CSRS was performed in accordance of the [facility] prior to dispensing any controlled drugs.        The patient was evaluated for the symptoms described in the history of present illness. The patient was evaluated in the context of the global COVID-19 pandemic, which necessitated consideration that the patient might be at risk for infection with the SARS-CoV-2 virus that causes COVID-19. Institutional protocols and algorithms that pertain to the evaluation of patients at risk for COVID-19 are in a state of rapid change based on information released by regulatory bodies including the CDC and federal and state organizations. The most current policies and algorithms were followed during the patient's care in the ED.   Patient's diagnosis is consistent with suspected COVID-19, cough, shortness of breath.  Patient presented to the emergency department with 2-day complaint of cough, worsening shortness of breath.  Patient reports that he has a history of A. fib, diabetes, heart failure.  Patient presented to the emergency department chest tightness and shortness of breath.  Patient did have an accompanying fever.  Overall exam was reassuring.  Patient did have slight bump in his creatinine but labs are otherwise reassuring around patient's baseline.  I suspect patient has slight dehydration.  Patient will be given IV fluids, Solu-Medrol, albuterol here in the emergency department.  As of patient's imaging which reveals no indication of pneumonia I suspect this is viral either viral bronchitis or COVID-19.  Patient will be tested for COVID-19.  Prednisone and albuterol at home.  At  this time, second troponin is only results still pending.  Once this returns patient will be suitable for discharge.  Pending  this final result, patient care will be left with attending provider, Dr. Colon BranchMonks.  Follow-up primary care.  Strict return precautions are discussed with the patient.  Patient is given ED precautions to return to the ED for any worsening or new symptoms.     ____________________________________________  FINAL CLINICAL IMPRESSION(S) / ED DIAGNOSES  Final diagnoses:  Suspected COVID-19 virus infection  Cough  Shortness of breath      NEW MEDICATIONS STARTED DURING THIS VISIT:  ED Discharge Orders    None          This chart was dictated using voice recognition software/Dragon. Despite best efforts to proofread, errors can occur which can change the meaning. Any change was purely unintentional.    Racheal PatchesCuthriell, Hazim Treadway D, PA-C 08/11/19 2110    Miguel AschoffMonks, Sarah L., MD 08/13/19 [id number]    Miguel AschoffMonks, Sarah L., MD 08/13/19 0430

## 2019-08-12 LAB — SARS CORONAVIRUS 2 (TAT 6-24 HRS): SARS Coronavirus 2: POSITIVE — AB

## 2019-08-13 ENCOUNTER — Telehealth: Payer: Self-pay | Admitting: Emergency Medicine

## 2019-08-13 NOTE — Telephone Encounter (Signed)
Patient called and he is aware of covid positive due to his pcp informed him.  He says he cannot see it in his mychart.  He says he has had fever and last night was nauseated and heaving.  He says he will go to [facility] to be seen.

## 2019-08-14 DIAGNOSIS — R042 Hemoptysis: Secondary | ICD-10-CM | POA: Diagnosis not present

## 2019-08-14 DIAGNOSIS — U071 COVID-19: Secondary | ICD-10-CM | POA: Diagnosis not present

## 2019-08-14 DIAGNOSIS — I480 Paroxysmal atrial fibrillation: Secondary | ICD-10-CM | POA: Diagnosis not present

## 2019-08-14 DIAGNOSIS — R918 Other nonspecific abnormal finding of lung field: Secondary | ICD-10-CM | POA: Diagnosis not present

## 2019-08-15 DIAGNOSIS — R918 Other nonspecific abnormal finding of lung field: Secondary | ICD-10-CM | POA: Diagnosis not present

## 2019-08-15 DIAGNOSIS — Z6841 Body Mass Index (BMI) 40.0 and over, adult: Secondary | ICD-10-CM | POA: Diagnosis not present

## 2019-08-15 DIAGNOSIS — R042 Hemoptysis: Secondary | ICD-10-CM | POA: Diagnosis not present

## 2019-08-15 DIAGNOSIS — E1110 Type 2 diabetes mellitus with ketoacidosis without coma: Secondary | ICD-10-CM | POA: Diagnosis not present

## 2019-08-15 DIAGNOSIS — I82502 Chronic embolism and thrombosis of unspecified deep veins of left lower extremity: Secondary | ICD-10-CM | POA: Diagnosis not present

## 2019-08-15 DIAGNOSIS — Z6838 Body mass index (BMI) 38.0-38.9, adult: Secondary | ICD-10-CM | POA: Diagnosis not present

## 2019-08-15 DIAGNOSIS — I5033 Acute on chronic diastolic (congestive) heart failure: Secondary | ICD-10-CM | POA: Diagnosis not present

## 2019-08-15 DIAGNOSIS — N179 Acute kidney failure, unspecified: Secondary | ICD-10-CM | POA: Diagnosis not present

## 2019-08-15 DIAGNOSIS — I480 Paroxysmal atrial fibrillation: Secondary | ICD-10-CM | POA: Diagnosis not present

## 2019-08-15 DIAGNOSIS — J1289 Other viral pneumonia: Secondary | ICD-10-CM | POA: Diagnosis not present

## 2019-08-15 DIAGNOSIS — J9601 Acute respiratory failure with hypoxia: Secondary | ICD-10-CM | POA: Diagnosis not present

## 2019-08-15 DIAGNOSIS — J129 Viral pneumonia, unspecified: Secondary | ICD-10-CM | POA: Diagnosis not present

## 2019-08-15 DIAGNOSIS — I5022 Chronic systolic (congestive) heart failure: Secondary | ICD-10-CM | POA: Diagnosis not present

## 2019-08-15 DIAGNOSIS — Z23 Encounter for immunization: Secondary | ICD-10-CM | POA: Diagnosis not present

## 2019-08-15 DIAGNOSIS — Z86718 Personal history of other venous thrombosis and embolism: Secondary | ICD-10-CM | POA: Diagnosis not present

## 2019-08-15 DIAGNOSIS — E119 Type 2 diabetes mellitus without complications: Secondary | ICD-10-CM | POA: Diagnosis not present

## 2019-08-15 DIAGNOSIS — J810 Acute pulmonary edema: Secondary | ICD-10-CM | POA: Diagnosis not present

## 2019-08-15 DIAGNOSIS — U071 COVID-19: Secondary | ICD-10-CM | POA: Diagnosis not present

## 2019-08-15 DIAGNOSIS — E669 Obesity, unspecified: Secondary | ICD-10-CM | POA: Diagnosis not present

## 2019-08-15 DIAGNOSIS — E871 Hypo-osmolality and hyponatremia: Secondary | ICD-10-CM | POA: Diagnosis not present

## 2019-08-16 ENCOUNTER — Other Ambulatory Visit: Payer: Self-pay | Admitting: Family Medicine

## 2019-08-16 DIAGNOSIS — R042 Hemoptysis: Secondary | ICD-10-CM | POA: Diagnosis not present

## 2019-08-16 DIAGNOSIS — E871 Hypo-osmolality and hyponatremia: Secondary | ICD-10-CM | POA: Diagnosis not present

## 2019-08-16 DIAGNOSIS — J9601 Acute respiratory failure with hypoxia: Secondary | ICD-10-CM | POA: Diagnosis not present

## 2019-08-16 DIAGNOSIS — I10 Essential (primary) hypertension: Secondary | ICD-10-CM

## 2019-08-16 DIAGNOSIS — J810 Acute pulmonary edema: Secondary | ICD-10-CM | POA: Diagnosis not present

## 2019-08-16 DIAGNOSIS — Z86718 Personal history of other venous thrombosis and embolism: Secondary | ICD-10-CM | POA: Diagnosis not present

## 2019-08-16 DIAGNOSIS — N179 Acute kidney failure, unspecified: Secondary | ICD-10-CM | POA: Diagnosis not present

## 2019-08-16 DIAGNOSIS — E1110 Type 2 diabetes mellitus with ketoacidosis without coma: Secondary | ICD-10-CM | POA: Diagnosis not present

## 2019-08-16 DIAGNOSIS — I480 Paroxysmal atrial fibrillation: Secondary | ICD-10-CM | POA: Diagnosis not present

## 2019-08-16 DIAGNOSIS — I5033 Acute on chronic diastolic (congestive) heart failure: Secondary | ICD-10-CM | POA: Diagnosis not present

## 2019-08-16 DIAGNOSIS — Z6841 Body Mass Index (BMI) 40.0 and over, adult: Secondary | ICD-10-CM | POA: Diagnosis not present

## 2019-08-16 DIAGNOSIS — U071 COVID-19: Secondary | ICD-10-CM | POA: Diagnosis not present

## 2019-08-16 DIAGNOSIS — J1289 Other viral pneumonia: Secondary | ICD-10-CM | POA: Diagnosis not present

## 2019-08-16 LAB — CULTURE, BLOOD (ROUTINE X 2)
Culture: NO GROWTH
Special Requests: ADEQUATE

## 2019-08-16 NOTE — Telephone Encounter (Signed)
Forwarding medication refill request to the PCP for review. 

## 2019-08-17 DIAGNOSIS — E119 Type 2 diabetes mellitus without complications: Secondary | ICD-10-CM | POA: Diagnosis not present

## 2019-08-17 DIAGNOSIS — J1289 Other viral pneumonia: Secondary | ICD-10-CM | POA: Diagnosis not present

## 2019-08-17 DIAGNOSIS — J9601 Acute respiratory failure with hypoxia: Secondary | ICD-10-CM | POA: Diagnosis not present

## 2019-08-17 DIAGNOSIS — U071 COVID-19: Secondary | ICD-10-CM | POA: Diagnosis not present

## 2019-08-18 DIAGNOSIS — U071 COVID-19: Secondary | ICD-10-CM | POA: Diagnosis not present

## 2019-08-18 DIAGNOSIS — J1289 Other viral pneumonia: Secondary | ICD-10-CM | POA: Diagnosis not present

## 2019-08-18 DIAGNOSIS — J9601 Acute respiratory failure with hypoxia: Secondary | ICD-10-CM | POA: Diagnosis not present

## 2019-08-18 DIAGNOSIS — E119 Type 2 diabetes mellitus without complications: Secondary | ICD-10-CM | POA: Diagnosis not present

## 2019-08-19 DIAGNOSIS — J1289 Other viral pneumonia: Secondary | ICD-10-CM | POA: Diagnosis not present

## 2019-08-19 DIAGNOSIS — E119 Type 2 diabetes mellitus without complications: Secondary | ICD-10-CM | POA: Diagnosis not present

## 2019-08-19 DIAGNOSIS — U071 COVID-19: Secondary | ICD-10-CM | POA: Diagnosis not present

## 2019-08-19 DIAGNOSIS — J9601 Acute respiratory failure with hypoxia: Secondary | ICD-10-CM | POA: Diagnosis not present

## 2019-08-20 DIAGNOSIS — E119 Type 2 diabetes mellitus without complications: Secondary | ICD-10-CM | POA: Diagnosis not present

## 2019-08-20 DIAGNOSIS — J1289 Other viral pneumonia: Secondary | ICD-10-CM | POA: Diagnosis not present

## 2019-08-20 DIAGNOSIS — U071 COVID-19: Secondary | ICD-10-CM | POA: Diagnosis not present

## 2019-08-20 DIAGNOSIS — J9601 Acute respiratory failure with hypoxia: Secondary | ICD-10-CM | POA: Diagnosis not present

## 2019-08-23 ENCOUNTER — Telehealth: Payer: Self-pay | Admitting: Family Medicine

## 2019-08-23 DIAGNOSIS — E1159 Type 2 diabetes mellitus with other circulatory complications: Secondary | ICD-10-CM

## 2019-08-23 MED ORDER — CONTOUR NEXT TEST VI STRP: ORAL_STRIP | 12 refills | Status: DC

## 2019-08-23 NOTE — Telephone Encounter (Signed)
Medication Refill - Medication: glucose blood (CONTOUR NEXT TEST) test strip  Preferred Pharmacy:  [id number] - [place], [address] [phone] (Phone) [phone] (Fax)     Pt was advised that RX refills may take up to 3 business days. We ask that you follow-up with your pharmacy.

## 2019-08-24 NOTE — Telephone Encounter (Signed)
Pt called stating the pharmacy has not received prescription. PT states he needs these by Monday. Please advise.

## 2019-08-24 NOTE — Telephone Encounter (Signed)
Order faxed.

## 2019-08-26 ENCOUNTER — Other Ambulatory Visit: Payer: Self-pay | Admitting: Cardiovascular Disease

## 2019-08-27 ENCOUNTER — Other Ambulatory Visit: Payer: Self-pay

## 2019-08-27 DIAGNOSIS — E1159 Type 2 diabetes mellitus with other circulatory complications: Secondary | ICD-10-CM

## 2019-08-27 MED ORDER — CONTOUR NEXT TEST VI STRP: ORAL_STRIP | 12 refills | Status: DC

## 2019-08-27 NOTE — Telephone Encounter (Signed)
Please review for refill. Thanks!  

## 2019-08-27 NOTE — Telephone Encounter (Signed)
Pt's age 68, wt 124.7 kg, SCr 1.41, CrCl 88.44, last ov w/ TG 03/08/19.

## 2019-08-28 DIAGNOSIS — R69 Illness, unspecified: Secondary | ICD-10-CM | POA: Diagnosis not present

## 2019-08-29 ENCOUNTER — Encounter: Payer: Self-pay | Admitting: Family Medicine

## 2019-08-29 ENCOUNTER — Other Ambulatory Visit: Payer: Self-pay

## 2019-08-29 ENCOUNTER — Ambulatory Visit (INDEPENDENT_AMBULATORY_CARE_PROVIDER_SITE_OTHER): Payer: Medicare HMO | Admitting: Family Medicine

## 2019-08-29 DIAGNOSIS — I10 Essential (primary) hypertension: Secondary | ICD-10-CM | POA: Diagnosis not present

## 2019-08-29 DIAGNOSIS — E782 Mixed hyperlipidemia: Secondary | ICD-10-CM | POA: Diagnosis not present

## 2019-08-29 DIAGNOSIS — U071 COVID-19: Secondary | ICD-10-CM | POA: Diagnosis not present

## 2019-08-29 DIAGNOSIS — E1110 Type 2 diabetes mellitus with ketoacidosis without coma: Secondary | ICD-10-CM

## 2019-08-29 DIAGNOSIS — I825Y2 Chronic embolism and thrombosis of unspecified deep veins of left proximal lower extremity: Secondary | ICD-10-CM | POA: Diagnosis not present

## 2019-08-29 DIAGNOSIS — N179 Acute kidney failure, unspecified: Secondary | ICD-10-CM

## 2019-08-29 DIAGNOSIS — I4891 Unspecified atrial fibrillation: Secondary | ICD-10-CM

## 2019-08-29 DIAGNOSIS — J189 Pneumonia, unspecified organism: Secondary | ICD-10-CM

## 2019-08-29 NOTE — Assessment & Plan Note (Signed)
Unable to get BP today- will check in 2 weeks. Call with any concerns.

## 2019-08-29 NOTE — Assessment & Plan Note (Signed)
Will check labs at appointment in 2 weeks. Call with any concerns.

## 2019-08-29 NOTE — Progress Notes (Signed)
There were no vitals taken for this visit.   Subjective:    Patient ID: Jeff [name]., male    DOB: 1951-05-02, 68 y.o.   MRN: [id number]  HPI: Jeff [name]. is a 68 y.o. male  Chief Complaint  Patient presents with  . Hospitalization Follow-up    pt states he was in [facility] for COVID, states he was in ICU. Tested at [facility] but not told that he was positive.   Transition of Care Hospital Follow up.   Hospital/Facility: [facility] D/C Physician: Wenda Overland, MD D/C Date: 08/20/19  Records Requested: 08/29/19 Records Received: 08/29/19 Records Reviewed: 08/29/19  Diagnoses on Discharge: Hemoptysis, Chest pain, COVID-19  Date of interactive Contact within 48 hours of discharge: 08/21/19 Contact was through: phone through [facility]  Date of 7 day or 14 day face-to-face visit: 08/29/19  within 14 days  Outpatient Encounter Medications as of 08/29/2019  Medication Sig  . acetaminophen (TYLENOL) 500 MG tablet Take 2 tablets (1,000 mg total) by mouth every 8 (eight) hours.  Marland Kitchen albuterol (VENTOLIN HFA) 108 (90 Base) MCG/ACT inhaler Inhale 2 puffs into the lungs every 4 (four) hours as needed for wheezing or shortness of breath.  Marland Kitchen aspirin 325 MG tablet Take 325 mg by mouth daily.  Marland Kitchen atorvastatin (LIPITOR) 20 MG tablet TAKE 1/2 TABLET BY MOUTH EVERY DAY  . benazepril (LOTENSIN) 40 MG tablet TAKE 1 TABLET BY MOUTH EVERY DAY  . fluticasone (FLONASE) 50 MCG/ACT nasal spray Place into both nostrils daily as needed for allergies or rhinitis.  Marland Kitchen glucose blood (CONTOUR NEXT TEST) test strip Use as instructed  . guaiFENesin (MUCINEX PO) Take by mouth as needed.  Marland Kitchen HYDROcodone-acetaminophen (NORCO) 5-325 MG tablet Take 1-2 tablets by mouth every 4 (four) hours as needed for moderate pain or severe pain.  . metFORMIN (GLUCOPHAGE) 1000 MG tablet TAKE 1 TABLET (1,000 MG TOTAL) BY MOUTH 2 (TWO) TIMES DAILY WITH A MEAL.  . metoprolol tartrate (LOPRESSOR) 50 MG tablet TAKE 1 TABLET BY MOUTH  TWICE A DAY  . montelukast (SINGULAIR) 10 MG tablet Take 10 mg by mouth daily.   . ondansetron (ZOFRAN ODT) 4 MG disintegrating tablet Take 1 tablet (4 mg total) by mouth every 8 (eight) hours as needed for nausea or vomiting.  . rivaroxaban (XARELTO) 20 MG TABS tablet Take 1 tablet (20 mg total) by mouth daily.  Marland Kitchen diltiazem (CARDIZEM CD) 120 MG 24 hr capsule Take 1 capsule (120 mg total) by mouth daily.  . [DISCONTINUED] benzonatate (TESSALON PERLES) 100 MG capsule Take 1 capsule (100 mg total) by mouth every 6 (six) hours as needed.  . [DISCONTINUED] chlorpheniramine-HYDROcodone (TUSSIONEX PENNKINETIC ER) 10-8 MG/5ML SUER Take 2.5-5 mLs by mouth every 12 (twelve) hours as needed for cough. (Patient not taking: Reported on 05/10/2019)  . [DISCONTINUED] predniSONE (DELTASONE) 50 MG tablet Take 1 tablet (50 mg total) by mouth daily with breakfast.   No facility-administered encounter medications on file as of 08/29/2019.   Per hospitalist: "Hospital Course by Problem:  # Acute hypoxic respiratory failure due to flash pulmonary edema # Community-acquired pneumonia # COVID 19 pneumonia Initially presented on 10/6 with several days of fever, cough, and dyspnea. He was seen at an outside hospital on 10/4 where he was tested for cough (this later came back positive) but was discharged home with a course of prednisone. In the ED here on 10/6, he was initially satting mid-90s on room air but CXR showed a new RML consolidation,  concerning for bacterial CAP. He was afebrile and WBC normal at 10 (despite being on steroids). He was started on CTX/azithromycin for CAP (received 5-day course) and admitted. On 10/7, after receiving ~4L of IV fluids, he became acutely hypoxic to the low 80s requiring Optiflow 50%/50L as well as hypertensive to the 200s/100s and tachycardic to the 130s. CXR with increased bilateral opacities consistent with flash pulmonary edema. He was transferred to the MICU where he remained for 18  hours for diuresis. By 10/8, he was back down to 3L O2 and transferred to the floor. By 10/10, he was able to come off all supplemental oxygen. By discharge he was satting in the high 90s and complaining of no dyspnea. He was enrolled in the ACTIV-III trial this admission and received 5 days of remdesivir (10/7-10/11) and either a monoclonal antibody or placebo.  # Diabetic ketoacidosis # Type 2 diabetes mellitus (poorly controlled, A1c 8.9%) Presented in DKA with glucose 360, bicarb 13, anion gap 24, and BHB 6.89. He is only on metformin at home so suspect this was triggered by the steroids he received several days prior to admission. We treated him with IV fluids and an insulin infusion on 10/7 before transitioning him to subcutaneous insulin early morning 10/8. We resumed his home metformin on 10/10. His blood glucose was well-controlled in the 100-200 range off of subcutaneous insulin by 10/12, so we stopped all insulin on discharge. Suspect his hyperglycemia was worsened by steroids and he would not be insulin-dependent on discharge.  # Atrial fibrillation Continued on home metoprolol tartrate  q12h and diltiazem XR  once daily. Briefly went into RVR requiring IV metoprolol on 10/7.  # Acute kidney injury (resolved) Cr 1.8 on admission, prerenal from his DKA. Resolved back to baseline (<1) by 10/10 with IV fluids.  #Chronic LLE DVT #IVC placement c/b thrombosis s/p removal and thrombectomy (12/2017)  #IVC stenosis s/p stent replacement (05/2019) Continued on home ASA  daily, rivaroxaban  daily.  # HTN: Continued home benazepril  daily # HLD: Continued home atorvastatin  daily # Chronic sinusitis: continued home azelastine spray, montelukast, and guaifenesin."  Diagnostic Tests Reviewed: Pertinent Imaging:  CT-A chest, PE protocol, 08/14/19 1. No evidence of pulmonary embolism through the lobar pulmonary arteries on limited exam. This exam is nondiagnostic for  exclusion of pulmonary embolism at the level of the segmental pulmonary arteries. 2. Multifocal ground glass opacities within the lingula as well as nodular and groundglass opacities within the bilateral lung bases. There is also a dense consolidative opacity within the right middle lobe. Findings are concerning for multifocal infection including possibly COVID-19 given clinical history. 3. Stable appearance of a tiny solid pulmonary nodule within the left upper lobe. Additional small pulmonary nodules described on prior CT cannot be evaluated on this exam due to consolidative and groundglass opacities.  CXR, 08/15/19 The cardiac silhouette appears enlarged, which may be attributable to technique. The mediastinal contours are within normal limits. Bilateral heterogeneous airspace opacities most pronounced within the lung bases and left midlung, likely sequela of the known COVID-19 infection. No pleural effusion or pneumothorax.  Disposition: Home  Consults: None  Discharge Instructions: Follow up here  Disease/illness Education: Given today  Home Health/Community Services Discussions/Referrals: N/A  Establishment or re-establishment of referral orders for community resources: N/A  Discussion with other health care providers: N/A  Assessment and Support of treatment regimen adherence: Excellent  Appointments Coordinated with: Patient  Education for self-management, independent living, and ADLs: Given today  Since  getting out of the hospital, he has been feeling a lot better. He was on a trial in [facility] and was also given antibodies. He has been doing better. He is still feeling a little short of breath when going up and down stairs, but continues to get better every day. No fevers. No chills. Otherwise feeling well. No other concerns at this time. Sugars running in the 140s.   Relevant past medical, surgical, family and social history reviewed and updated as indicated. Interim  medical history since our last visit reviewed. Allergies and medications reviewed and updated.  Review of Systems  Constitutional: Negative.   HENT: Negative.   Respiratory: Positive for shortness of breath (with climbing stairs). Negative for apnea, cough, choking, chest tightness, wheezing and stridor.   Cardiovascular: Negative.   Gastrointestinal: Negative.   Neurological: Negative.   Psychiatric/Behavioral: Negative.     Per HPI unless specifically indicated above     Objective:    There were no vitals taken for this visit.  Wt Readings from Last 3 Encounters:  08/11/19 275 lb (124.7 kg)  05/31/19 282 lb (127.9 kg)  01/06/19 285 lb (129.3 kg)    Physical Exam Vitals signs and nursing note reviewed.  Pulmonary:     Effort: Pulmonary effort is normal. No respiratory distress.     Comments: Speaking in full sentences Neurological:     Mental Status: He is alert.  Psychiatric:        Mood and Affect: Mood normal.        Behavior: Behavior normal.        Thought Content: Thought content normal.        Judgment: Judgment normal.     Results for orders placed or performed during the hospital encounter of 08/11/19  Culture, blood (Routine x 2)   Specimen: BLOOD  Result Value Ref Range   Specimen Description BLOOD LEFT ANTECUBITAL    Special Requests      BOTTLES DRAWN AEROBIC AND ANAEROBIC Blood Culture adequate volume   Culture      NO GROWTH 5 DAYS Performed at [facility], [address] [place], [place] [address]    Report Status 08/16/2019 FINAL   SARS CORONAVIRUS 2 (TAT 6-24 HRS) Nasopharyngeal Nasopharyngeal Swab   Specimen: Nasopharyngeal Swab  Result Value Ref Range   SARS Coronavirus 2 POSITIVE (A) NEGATIVE  Comprehensive metabolic panel  Result Value Ref Range   Sodium 135 135 - 145 mmol/L   Potassium 4.6 3.5 - 5.1 mmol/L   Chloride 101 98 - 111 mmol/L   CO2 24 22 - 32 mmol/L   Glucose, Bld 228 (H) 70 - 99 mg/dL   BUN 20 8 - 23 mg/dL    Creatinine, Ser 2.13 (H) 0.61 - 1.24 mg/dL   Calcium 8.6 (L) 8.9 - 10.3 mg/dL   Total Protein 7.0 6.5 - 8.1 g/dL   Albumin 4.2 3.5 - 5.0 g/dL   AST 29 15 - 41 U/L   ALT 18 0 - 44 U/L   Alkaline Phosphatase 61 38 - 126 U/L   Total Bilirubin 1.3 (H) 0.3 - 1.2 mg/dL   GFR calc non Af Amer 51 (L) >60 mL/min   GFR calc Af Amer 59 (L) >60 mL/min   Anion gap 10 5 - 15  Lactic acid, plasma  Result Value Ref Range   Lactic Acid, Venous 1.7 0.5 - 1.9 mmol/L  CBC with Differential  Result Value Ref Range   WBC 6.7 4.0 - 10.5  K/uL   RBC 5.41 4.22 - 5.81 MIL/uL   Hemoglobin 16.1 13.0 - 17.0 g/dL   HCT 16.149.3 09.639.0 - 04.552.0 %   MCV 91.1 80.0 - 100.0 fL   MCH 29.8 26.0 - 34.0 pg   MCHC 32.7 30.0 - 36.0 g/dL   RDW 40.913.2 81.111.5 - 91.415.5 %   Platelets 169 150 - 400 K/uL   nRBC 0.0 0.0 - 0.2 %   Neutrophils Relative % 80 %   Neutro Abs 5.3 1.7 - 7.7 K/uL   Lymphocytes Relative 8 %   Lymphs Abs 0.5 (L) 0.7 - 4.0 K/uL   Monocytes Relative 10 %   Monocytes Absolute 0.7 0.1 - 1.0 K/uL   Eosinophils Relative 0 %   Eosinophils Absolute 0.0 0.0 - 0.5 K/uL   Basophils Relative 1 %   Basophils Absolute 0.0 0.0 - 0.1 K/uL   Immature Granulocytes 1 %   Abs Immature Granulocytes 0.07 0.00 - 0.07 K/uL  Protime-INR  Result Value Ref Range   Prothrombin Time 16.8 (H) 11.4 - 15.2 seconds   INR 1.4 (H) 0.8 - 1.2  Urinalysis, Complete w Microscopic  Result Value Ref Range   Color, Urine YELLOW (A) YELLOW   APPearance CLEAR (A) CLEAR   Specific Gravity, Urine 1.023 1.005 - 1.030   pH 5.0 5.0 - 8.0   Glucose, UA >=500 (A) NEGATIVE mg/dL   Hgb urine dipstick SMALL (A) NEGATIVE   Bilirubin Urine NEGATIVE NEGATIVE   Ketones, ur 20 (A) NEGATIVE mg/dL   Protein, ur 782100 (A) NEGATIVE mg/dL   Nitrite NEGATIVE NEGATIVE   Leukocytes,Ua NEGATIVE NEGATIVE   RBC / HPF 0-5 0 - 5 RBC/hpf   WBC, UA 0-5 0 - 5 WBC/hpf   Bacteria, UA NONE SEEN NONE SEEN   Squamous Epithelial / LPF NONE SEEN 0 - 5   Mucus PRESENT   Brain  natriuretic peptide  Result Value Ref Range   B Natriuretic Peptide 209.0 (H) 0.0 - 100.0 pg/mL  Troponin I (High Sensitivity)  Result Value Ref Range   Troponin I (High Sensitivity) 10 <18 ng/L  Troponin I (High Sensitivity)  Result Value Ref Range   Troponin I (High Sensitivity) 11 <18 ng/L      Assessment & Plan:   Problem List Items Addressed This Visit      Cardiovascular and Mediastinum   HYPERTENSION, BENIGN    Unable to get BP today- will check in 2 weeks. Call with any concerns.       A-fib [facility](HCC)     Other   Mixed hyperlipidemia    Will check labs at appointment in 2 weeks. Call with any concerns.        Other Visit Diagnoses    COVID-19    -  Primary   Improved significantly. Still working with [facility] due to the trial he was on. On quarantine until 10/23. Call with any concerns.    Community acquired pneumonia, unspecified laterality       No fevers or chills or cough. Has finished his antibiotics. Continue to monitor. Will listen to his lungs in 2 weeks.    Diabetic ketoacidosis without coma associated with type 2 diabetes mellitus (HCC)       Sugars running in the 140s. Continue to monitor. Call with any concerns.    AKI (acute kidney injury) (HCC)       Will recheck when he comes into the office. Call with any concerns.    Chronic deep vein thrombosis (  DVT) of proximal vein of left lower extremity (HCC)       Stable with IVC. Continue to follow with vascular. Call with any concerns.        Follow up plan: Return in about 2 weeks (around 09/12/2019).   . This visit was completed via telephone due to the restrictions of the COVID-19 pandemic. All issues as above were discussed and addressed but no physical exam was performed. If it was felt that the patient should be evaluated in the office, they were directed there. The patient verbally consented to this visit. Patient was unable to complete an audio/visual visit due to Lack of equipment. Due to the catastrophic  nature of the COVID-19 pandemic, this visit was done through audio contact only. . Location of the patient: home . Location of the provider: home . Those involved with this call:  . Provider: Park Liter, DO . CMA: Yvonna Alanis, [place] . Front Desk/Registration: Don Perking  . Time spent on call: 25 minutes on the phone discussing health concerns. 40 minutes total spent in review of patient's record and preparation of their chart.

## 2019-09-07 ENCOUNTER — Ambulatory Visit: Payer: Self-pay

## 2019-09-07 ENCOUNTER — Telehealth: Payer: Self-pay | Admitting: Cardiovascular Disease

## 2019-09-07 NOTE — Telephone Encounter (Signed)
Incoming call from Patient with a complaint  of elevated blood Pressure .  166/109, 161/107 HR88, 155/111 and 158/105.  Reviewed  Protocol with Pt.  Recommended Pt go to [facility].  For further evaluation. Pt.  Called  Ed the nurse recommend he contact his PCP.   Pt.  Awaits  Call from PCP office.           Reason for Disposition . [6] Systolic BP  >= 644 OR Diastolic >= 034 AND [7] cardiac or neurologic symptoms (e.g., chest pain, difficulty breathing, unsteady gait, blurred vision)  Answer Assessment - Initial Assessment Questions 1. BLOOD PRESSURE: "What is the blood pressure?" "Did you take at least two measurements 5 minutes apart?"  166/111   161/107 HR 88 2. ONSET: "When did you take your blood pressure?"     Last night 3. HOW: "How did you obtain the blood pressure?" (e.g., visiting nurse, automatic home BP monitor)    automatic 4. HISTORY: "Do you have a history of high blood pressure?"    " not really" 5. MEDICATIONS: "Are you taking any medications for blood pressure?" "Have you missed any doses recently?"    "I am now" 6. OTHER SYMPTOMS: "Do you have any symptoms?" (e.g., headache, chest pain, blurred vision, difficulty breathing, weakness)     Denies 7. PREGNANCY: "Is there any chance you are pregnant?" "When was your last menstrual period?"     Na  Protocols used: HIGH BLOOD PRESSURE-A-AH

## 2019-09-07 NOTE — Telephone Encounter (Signed)
I spoke with the patient. He states that his BP has been running high for "a while," but he was getting concerned about this and wanted to follow up with Dr. Rockey Situ.  BP's have been running 140-150/ 90-100's on average. Today- 166/108 with a repeat reading at 156/123 prior to his meds.  He retook his BP ~ 9:15 am (after meds) and he was 154/101. HR's are averaging 80-90's.  Confirmed he is taking: - benazepril 40 mg QD (at 7 am) - diltiazem 120 mg QD (at 7 am) - metoprolol tartrate 50 mg BID (at 7am/ ~7pm)  The patient confirms he was at [facility] on 08/11/19 and diagnosed with COVID 19. He was started on prednisone which elevated his blood sugar.  He was not admitted. He went to [facility] ER on Tuesday 10/6 at 6 am and sat in the ER in a cubicle until 10 pm that night, even being COVID +. He states he stayed there 6 days and was started on remdesivir and antibodies. He was diagnosed with pneumonia as well. He was discharged/ quarantined for 20 days.  He has been afebrile since hospital discharge.  He continues to take his xarelto as well.   He feels he would not be here if not treated at [facility].   He is now concerned about his BP and is requesting a follow up with Dr. Rockey Situ only. I advised that patient since his BP being elevated today is not new for him I feel he is ok to wait to be seen until next week. I have offered him a 1:40 pm appointment with Dr. Rockey Situ on Monday 09/10/19. The patient voices understanding and is agreeable.

## 2019-09-07 NOTE — Telephone Encounter (Signed)
Pt c/o BP issue: STAT if pt c/o blurred vision, one-sided weakness or slurred speech  1. What are your last 5 BP readings? Trend 140-150/90 today 166/108 then 156/123 ( digital same arm readings close together today )  2. Are you having any other symptoms (ex. Dizziness, headache, blurred vision, passed out)? Not feeling well cant sleep   3. What is your BP issue? Trending up concerned too high recent [facility] admission for covid on 10/3   Patient wants appt asap but declined sooner with APP and declined to be scheduled 10/23 next available as this is too far out.   Marland Kitchen

## 2019-09-08 NOTE — Progress Notes (Signed)
Evaluation Performed:  Follow-up visit  Date:  09/10/2019   ID:  Jeff Lips., DOB 03-17-1951, MRN [id number]  Patient Location:  [address] [place] [address]   Provider location:   [facility] [place] office  PCP:  Guadalupe Maple, MD  Cardiologist:  Patsy Baltimore  Chief Complaint  Patient presents with  . other    6 month f/u c/o flunctuating BP. Meds reviewed verbally with pt.    History of Present Illness:    Jeff Harkin. is a 68 y.o. male  past medical history of DM2  hemoglobin A1c 7.7 DVT LLE 2010, with filter hyperlipidemia  April 2011  atrial fibrillation with RVR,  emergency room on June 8 2062for rapid atrial fibrillation,  s/p cardioversion  lower extremity edema On calcium channel blockers,  Echo in 02/2010 shows EF 40-45%,  possibly tachycardia induced  Converted back to atrial fibrillation in 2016, at that time did not want cardioversion (CHF, HTN, age x 1, DM, vascular disease): IVC stenosis, followed by [facility]  he presents for routine followup for chronic atrial fibrillation, and extensive DVT   at [facility] on 08/11/19 and diagnosed with COVID 19. He was started on prednisone which elevated his blood sugar.  He was not admitted. He went to [facility] ER on Tuesday 10/6 at 6 am and sat in the ER in a cubicle until 10 pm that night, even being COVID +.  stayed there 6 days and was started on remdesivir and antibodies trial diagnosed with pneumonia as well.  discharged/ quarantined for 20 days.  He has been afebrile since hospital discharge.  He continues to take his xarelto as well.   Feels blood pressure is elevated taking: - benazepril 40 mg QD (at 7 am) - diltiazem 120 mg QD (at 7 am) - metoprolol tartrate 50 mg BID (at 7am/ ~7pm)  BP's have been running 140-150/ 90-100's on average. Today- 166/108 with a repeat reading at 156/123 prior to his meds.  He retook his BP ~ 9:15 am (after meds) and he was  154/101. HR's are averaging 80-90's.  Low energy Has follow-up with [facility]  Has some lower extremity edema  EKG personally reviewed by myself on todays visit Shows atrial fibrillation rate 95 bpm no significant ST-T wave changes  Other past medical history reviewed Chest pain in ER 12/2018 Hospital records reviewed with the patient in detail SOB, and coughing Took Mucinex and Zyrtec.  He thinks maybe it is a reaction to both of the medicines  chest tightness and warmth associated with some clammy feeling. Had pressure 185/93 Pulse 127  08/2018  Sepsis secondary to cellulitis of the left lower extremity and streptococcus bacteremia: Hospital records reviewed with the patient in detail  Total cholesterol 130 Mildly elevated AST ALT LDL 81 up from 56  Hemoglobin A1c 6.9 down from the 9 range several years ago  On prior clinic visit was not taking xarelto for his atrial fibrillation developed severe sinusitis/URI , weight loss over 2 weeks, bedrest  Became tremendously weak requiring hospitalization  early January 2019 Diagnosed with dehydration, renal failure, syncope, left lower extremity DVT Syncope in the setting of not eating well, weakness home  Started on heparin infusion for DVT left lower extremity  thrombus was occlusive left SFA down to popliteal   IVC filter in place from 2010  As outpatient seen by Dr. Ronalee Belts, "too much clot" Recommended lymphedema compression pumps  Referred to [facility] for thrombectomy Reports that he had thrombectomy procedure , took hours 5 pm to 10 pm No anesthesia, very painful  He has had follow-up lower extremity CT scan at [facility] Confirming residual B/l extensive clot, iliacs extending downward  Echo in 02/2010 shows EF 40-45% which was felt to be tachycardia induced.   Prior CV studies:   The following studies were reviewed today:    Past Medical History:  Diagnosis Date  . Chronic deep vein thrombosis (DVT) (HCC)   .  Chronic low back pain   . Chronic systolic heart failure (HCC)    a. TTE 2011 with EF 40-45% per notes  . Diabetes mellitus without complication (HCC)   . Erectile dysfunction   . HLD (hyperlipidemia)   . Hypertension   . Hypogonadism in male   . Permanent atrial fibrillation (HCC)    a. s/p DCCV 2011; b. redeveloped Afib in 2016; c. CHADS2VASc at least 5 (CHF, HTN, age x 1, DM, vascular disease); d. on Xarelto  . Torn meniscus    right knee   Past Surgical History:  Procedure Laterality Date  . COLONOSCOPY WITH PROPOFOL N/A 05/28/2016   Procedure: COLONOSCOPY WITH PROPOFOL;  Surgeon: Midge Minium, MD;  Location: [facility];  Service: Endoscopy;  Laterality: N/A;  diabetic - oral meds  . DVT, leg Left   . INSERTION OF VENA CAVA FILTER  2014  . KNEE ARTHROSCOPY WITH MEDIAL MENISECTOMY Right 05/31/2019   Procedure: KNEE ARTHROSCOPY WITH partial  MEDIAL MENISECTOMY;  Surgeon: Signa Kell, MD;  Location: [facility];  Service: Orthopedics;  Laterality: Right;  . PERIPHERAL VASCULAR THROMBECTOMY Left 12/01/2017   Procedure: PERIPHERAL VASCULAR THROMBECTOMY;  Surgeon: Renford Dills, MD;  Location: [facility] IN[facility];  Service: Cardiovascular;  Laterality: Left;     Current Meds  Medication Sig  . acetaminophen (TYLENOL) 500 MG tablet Take 2 tablets (1,000 mg total) by mouth every 8 (eight) hours.  Marland Kitchen albuterol (VENTOLIN HFA) 108 (90 Base) MCG/ACT inhaler Inhale 2 puffs into the lungs every 4 (four) hours as needed for wheezing or shortness of breath.  Marland Kitchen aspirin 325 MG tablet Take 325 mg by mouth daily.  Marland Kitchen atorvastatin (LIPITOR) 20 MG tablet TAKE 1/2 TABLET BY MOUTH EVERY DAY  . benazepril (LOTENSIN) 40 MG tablet TAKE 1 TABLET BY MOUTH EVERY DAY  . diltiazem (CARDIZEM CD) 120 MG 24 hr capsule Take 1 capsule (120 mg total) by mouth daily.  . fluticasone (FLONASE) 50 MCG/ACT nasal spray Place into both nostrils daily as needed for allergies or rhinitis.  Marland Kitchen glucose blood  (CONTOUR NEXT TEST) test strip Use as instructed  . guaiFENesin (MUCINEX PO) Take by mouth as needed.  Marland Kitchen HYDROcodone-acetaminophen (NORCO) 5-325 MG tablet Take 1-2 tablets by mouth every 4 (four) hours as needed for moderate pain or severe pain.  . metFORMIN (GLUCOPHAGE) 1000 MG tablet TAKE 1 TABLET (1,000 MG TOTAL) BY MOUTH 2 (TWO) TIMES DAILY WITH A MEAL.  . metoprolol tartrate (LOPRESSOR) 50 MG tablet TAKE 1 TABLET BY MOUTH TWICE A DAY  . montelukast (SINGULAIR) 10 MG tablet Take 10 mg by mouth daily.   . rivaroxaban (XARELTO) 20 MG TABS tablet Take 1 tablet (20 mg total) by mouth daily.     Allergies:   Patient has no known allergies.   Social History   Tobacco Use  . Smoking status: Never Smoker  . Smokeless tobacco: Never Used  Substance Use Topics  . Alcohol use: No  Alcohol/week: 0.0 standard drinks  . Drug use: No     Current Outpatient Medications on File Prior to Visit  Medication Sig Dispense Refill  . acetaminophen (TYLENOL) 500 MG tablet Take 2 tablets (1,000 mg total) by mouth every 8 (eight) hours. 90 tablet 2  . albuterol (VENTOLIN HFA) 108 (90 Base) MCG/ACT inhaler Inhale 2 puffs into the lungs every 4 (four) hours as needed for wheezing or shortness of breath. 8 g 0  . aspirin 325 MG tablet Take 325 mg by mouth daily.    Marland Kitchen atorvastatin (LIPITOR) 20 MG tablet TAKE 1/2 TABLET BY MOUTH EVERY DAY 45 tablet 0  . benazepril (LOTENSIN) 40 MG tablet TAKE 1 TABLET BY MOUTH EVERY DAY 90 tablet 1  . diltiazem (CARDIZEM CD) 120 MG 24 hr capsule Take 1 capsule (120 mg total) by mouth daily. 30 capsule 11  . fluticasone (FLONASE) 50 MCG/ACT nasal spray Place into both nostrils daily as needed for allergies or rhinitis.    Marland Kitchen glucose blood (CONTOUR NEXT TEST) test strip Use as instructed 100 each 12  . guaiFENesin (MUCINEX PO) Take by mouth as needed.    Marland Kitchen HYDROcodone-acetaminophen (NORCO) 5-325 MG tablet Take 1-2 tablets by mouth every 4 (four) hours as needed for moderate pain  or severe pain. 10 tablet 0  . metFORMIN (GLUCOPHAGE) 1000 MG tablet TAKE 1 TABLET (1,000 MG TOTAL) BY MOUTH 2 (TWO) TIMES DAILY WITH A MEAL. 180 tablet 1  . metoprolol tartrate (LOPRESSOR) 50 MG tablet TAKE 1 TABLET BY MOUTH TWICE A DAY 180 tablet 1  . montelukast (SINGULAIR) 10 MG tablet Take 10 mg by mouth daily.   11  . rivaroxaban (XARELTO) 20 MG TABS tablet Take 1 tablet (20 mg total) by mouth daily. 90 tablet 1  . ondansetron (ZOFRAN ODT) 4 MG disintegrating tablet Take 1 tablet (4 mg total) by mouth every 8 (eight) hours as needed for nausea or vomiting. 20 tablet 0   No current facility-administered medications on file prior to visit.      Family Hx: The patient's family history includes Diabetes in his father; Diabetes Mellitus II in an other family member; Heart failure in his father; Lymphoma in his mother. There is no history of Heart attack, Hypertension, Cancer, COPD, Stroke, Prostate cancer, Kidney cancer, or Bladder Cancer.  ROS:   Please see the history of present illness.    Review of Systems  Constitutional: Negative.   Respiratory: Negative.   Cardiovascular: Negative.   Gastrointestinal: Negative.   Musculoskeletal: Negative.   Neurological: Negative.   Psychiatric/Behavioral: Negative.   All other systems reviewed and are negative.    Labs/Other Tests and Data Reviewed:    Recent Labs: 08/11/2019: ALT 18; B Natriuretic Peptide 209.0; BUN 20; Creatinine, Ser 1.41; Hemoglobin 16.1; Platelets 169; Potassium 4.6; Sodium 135   Recent Lipid Panel Lab Results  Component Value Date/Time   CHOL 130 09/07/2018 08:22 AM   TRIG 95 09/07/2018 08:22 AM   TRIG 111 02/17/2010   HDL 39 (L) 02/23/2018 10:32 AM   CHOLHDL 3.6 12/20/2016 04:20 PM   LDLCALC 49 02/23/2018 10:32 AM    Wt Readings from Last 3 Encounters:  09/10/19 282 lb 8 oz (128.1 kg)  08/11/19 275 lb (124.7 kg)  05/31/19 282 lb (127.9 kg)     Exam:    Vital Signs: Vital signs may also be detailed in  the HPI BP (!) 170/100 (BP Location: Left Arm, Patient Position: Sitting, Cuff Size: Large)   Pulse  95   Ht 5\' 10"  (1.778 m)   Wt 282 lb 8 oz (128.1 kg)   SpO2 98%   BMI 40.53 kg/m   Wt Readings from Last 3 Encounters:  09/10/19 282 lb 8 oz (128.1 kg)  08/11/19 275 lb (124.7 kg)  05/31/19 282 lb (127.9 kg)   Temp Readings from Last 3 Encounters:  08/11/19 98.9 F (37.2 C) (Oral)  05/31/19 97.8 F (36.6 C)  05/10/19 (!) 97.1 F (36.2 C) (Oral)   BP Readings from Last 3 Encounters:  09/10/19 (!) 170/100  08/11/19 (!) 192/111  05/31/19 (!) 127/92   Pulse Readings from Last 3 Encounters:  09/10/19 95  08/11/19 (!) 112  05/31/19 82    Constitutional:  oriented to person, place, and time. No distress.  HENT:  Head: Grossly normal Eyes:  no discharge. No scleral icterus.  Neck: No JVD, no carotid bruits  Cardiovascular: Regular rate and rhythm, no murmurs appreciated Pulmonary/Chest: Clear to auscultation bilaterally, no wheezes or rails Abdominal: Soft.  no distension.  no tenderness.  Musculoskeletal: Normal range of motion Neurological:  normal muscle tone. Coordination normal. No atrophy Skin: Skin warm and dry Psychiatric: normal affect, pleasant   ASSESSMENT & PLAN:    Atrial fibrillation, unspecified type (HCC) Rate relatively well controlled on his current medications Stay on diltiazem 120 metoprolol 50 twice daily On Xarelto  Congestive dilated cardiomyopathy (HCC) Has some worsening lower extremity edema, high blood pressure, weight gain Recommend he start Lasix 20 mg 3 days a week with potassium 20 mill equivalents 3 days a week Moderate his fluid intake  Type 2 diabetes mellitus with other circulatory complication, without long-term current use of insulin (HCC) Recommended strict diet No recent hemoglobin A1c available, previously 6.91-year ago  HYPERTENSION, BENIGN Blood pressure running high on a consistent basis Recommend Lasix 20 mg with  potassium 20 meq three days a week We will also add Cardura 1 mg daily If pressure does not improve we will go to Cardura 1 twice daily or 2 mg daily Avoid going up on diltiazem given leg swelling  Mixed hyperlipidemia Cholesterol at goal  Acute renal failure, unspecified acute renal failure type (HCC) Long history of diabetes,  Creatinine stable, numbers reviewed with him   Disposition: Follow-up in 2 months   Signed, Julien Nordmannimothy Gollan, MD  09/10/2019 2:02 PM    [facility] [place] [facility] [address], [place], [place] [id number]

## 2019-09-10 ENCOUNTER — Other Ambulatory Visit: Payer: Self-pay

## 2019-09-10 ENCOUNTER — Encounter: Payer: Self-pay | Admitting: Cardiovascular Disease

## 2019-09-10 ENCOUNTER — Ambulatory Visit (INDEPENDENT_AMBULATORY_CARE_PROVIDER_SITE_OTHER): Payer: Medicare HMO | Admitting: Cardiovascular Disease

## 2019-09-10 VITALS — BP 170/100 | HR 95 | Ht 70.0 in | Wt 282.5 lb

## 2019-09-10 DIAGNOSIS — E1159 Type 2 diabetes mellitus with other circulatory complications: Secondary | ICD-10-CM

## 2019-09-10 DIAGNOSIS — I10 Essential (primary) hypertension: Secondary | ICD-10-CM

## 2019-09-10 DIAGNOSIS — I420 Dilated cardiomyopathy: Secondary | ICD-10-CM

## 2019-09-10 DIAGNOSIS — E782 Mixed hyperlipidemia: Secondary | ICD-10-CM | POA: Diagnosis not present

## 2019-09-10 DIAGNOSIS — I4891 Unspecified atrial fibrillation: Secondary | ICD-10-CM | POA: Diagnosis not present

## 2019-09-10 MED ORDER — POTASSIUM CHLORIDE CRYS ER 20 MEQ PO TBCR: 20.0000 meq | EXTENDED_RELEASE_TABLET | Freq: Every day | ORAL | 3 refills | Status: DC

## 2019-09-10 MED ORDER — FUROSEMIDE 20 MG PO TABS: 20.0000 mg | ORAL_TABLET | Freq: Every day | ORAL | 3 refills | Status: DC

## 2019-09-10 MED ORDER — DOXAZOSIN MESYLATE 1 MG PO TABS: 1.0000 mg | ORAL_TABLET | Freq: Two times a day (BID) | ORAL | 3 refills | Status: DC

## 2019-09-10 NOTE — Patient Instructions (Addendum)
[facility]  Medication Instructions:  Please take lasix 20 mg three times a week Take  potassium 20 meq with lasix as needed  Pleas start cardura/doxazosin 1 mg daily If blood pressure runs high, We will increase up to 1 mg twice a day (or 2 mg at one time)  Please call with blood pressure measurements  If you need a refill on your cardiac medications before your next appointment, please call your pharmacy.    Lab work: No new labs needed   If you have labs (blood work) drawn today and your tests are completely normal, you will receive your results only by: Marland Kitchen MyChart Message (if you have MyChart) OR . A paper copy in the mail If you have any lab test that is abnormal or we need to change your treatment, we will call you to review the results.   Testing/Procedures: No new testing needed   Follow-Up: At [facility], you and your health needs are our priority.  As part of our continuing mission to provide you with exceptional heart care, we have created designated Provider Care Teams.  These Care Teams include your primary Cardiologist (physician) and Advanced Practice Providers (APPs -  Physician Assistants and Nurse Practitioners) who all work together to provide you with the care you need, when you need it.  . You will need a follow up appointment in 2 months  . Providers on your designated Care Team:   . Murray Hodgkins, NP . Christell Faith, PA-C . Marrianne Mood, PA-C  Any Other Special Instructions Will Be Listed Below (If Applicable).  For educational health videos Log in to : www.[email or web address] Or : [email or web address], password : [email or web address]   How to Take Your Blood Pressure You can take your blood pressure at home with a machine. You may need to check your blood pressure at home:  To check if you have high blood pressure (hypertension).  To check your blood pressure over time.  To make sure your blood pressure medicine is working. Supplies needed: You will need a  blood pressure machine, or monitor. You can buy one at a drugstore or online. When choosing one:  Choose one with an arm cuff.  Choose one that wraps around your upper arm. Only one finger should fit between your arm and the cuff.  Do not choose one that measures your blood pressure from your wrist or finger. Your doctor can suggest a monitor. How to prepare Avoid these things for 30 minutes before checking your blood pressure:  Drinking caffeine.  Drinking alcohol.  Eating.  Smoking.  Exercising. Five minutes before checking your blood pressure:  Pee.  Sit in a dining chair. Avoid sitting in a soft couch or armchair.  Be quiet. Do not talk. How to take your blood pressure Follow the instructions that came with your machine. If you have a digital blood pressure monitor, these may be the instructions: 1. Sit up straight. 2. Place your feet on the floor. Do not cross your ankles or legs. 3. Rest your left arm at the level of your heart. You may rest it on a table, desk, or chair. 4. Pull up your shirt sleeve. 5. Wrap the blood pressure cuff around the upper part of your left arm. The cuff should be 1 inch (2.5 cm) above your elbow. It is best to wrap the cuff around bare skin. 6. Fit the cuff snugly around your arm. You should be able to place only one finger between the cuff and  your arm. 7. Put the cord inside the groove of your elbow. 8. Press the power button. 9. Sit quietly while the cuff fills with air and loses air. 10. Write down the numbers on the screen. 11. Wait 2-3 minutes and then repeat steps 1-10. What do the numbers mean? Two numbers make up your blood pressure. The first number is called systolic pressure. The second is called diastolic pressure. An example of a blood pressure reading is "120 over 80" (or 120/80). If you are an adult and do not have a medical condition, use this guide to find out if your blood pressure is normal: Normal  First number: below  120.  Second number: below 80. Elevated  First number: 120-129.  Second number: below 80. Hypertension stage 1  First number: 130-139.  Second number: 80-89. Hypertension stage 2  First number: 140 or above.  Second number: 90 or above. Your blood pressure is above normal even if only the top or bottom number is above normal. Follow these instructions at home:  Check your blood pressure as often as your doctor tells you to.  Take your monitor to your next doctor's appointment. Your doctor will: ? Make sure you are using it correctly. ? Make sure it is working right.  Make sure you understand what your blood pressure numbers should be.  Tell your doctor if your medicines are causing side effects. Contact a doctor if:  Your blood pressure keeps being high. Get help right away if:  Your first blood pressure number is higher than 180.  Your second blood pressure number is higher than 120. This information is not intended to replace advice given to you by your health care provider. Make sure you discuss any questions you have with your health care provider. Document Released: 10/07/2008 Document Revised: 10/07/2017 Document Reviewed: 04/02/2016 Elsevier Patient Education  2020 [employer].

## 2019-09-11 ENCOUNTER — Telehealth: Payer: Self-pay | Admitting: Family Medicine

## 2019-09-11 NOTE — Telephone Encounter (Signed)
Pt states he was in hospital and had A1c checked and did not want to make an appointment until he feels better from covid.

## 2019-09-11 NOTE — Telephone Encounter (Signed)
Noted, thank you

## 2019-09-25 DIAGNOSIS — R69 Illness, unspecified: Secondary | ICD-10-CM | POA: Diagnosis not present

## 2019-10-02 DIAGNOSIS — I871 Compression of vein: Secondary | ICD-10-CM | POA: Diagnosis not present

## 2019-10-02 DIAGNOSIS — I8390 Asymptomatic varicose veins of unspecified lower extremity: Secondary | ICD-10-CM | POA: Diagnosis not present

## 2019-10-06 ENCOUNTER — Emergency Department: Payer: Medicare HMO

## 2019-10-06 ENCOUNTER — Observation Stay
Admission: EM | Admit: 2019-10-06 | Discharge: 2019-10-07 | Disposition: A | Discharge status: Home / Self Care | Payer: Medicare HMO | Attending: Internal Medicine | Admitting: Internal Medicine

## 2019-10-06 ENCOUNTER — Other Ambulatory Visit: Payer: Self-pay

## 2019-10-06 ENCOUNTER — Encounter: Payer: Self-pay | Admitting: Emergency Medicine

## 2019-10-06 DIAGNOSIS — Z79899 Other long term (current) drug therapy: Secondary | ICD-10-CM | POA: Insufficient documentation

## 2019-10-06 DIAGNOSIS — I4891 Unspecified atrial fibrillation: Secondary | ICD-10-CM

## 2019-10-06 DIAGNOSIS — Z833 Family history of diabetes mellitus: Secondary | ICD-10-CM | POA: Insufficient documentation

## 2019-10-06 DIAGNOSIS — I420 Dilated cardiomyopathy: Secondary | ICD-10-CM | POA: Insufficient documentation

## 2019-10-06 DIAGNOSIS — Z7982 Long term (current) use of aspirin: Secondary | ICD-10-CM | POA: Insufficient documentation

## 2019-10-06 DIAGNOSIS — R072 Precordial pain: Secondary | ICD-10-CM | POA: Diagnosis not present

## 2019-10-06 DIAGNOSIS — I200 Unstable angina: Secondary | ICD-10-CM | POA: Diagnosis not present

## 2019-10-06 DIAGNOSIS — I5022 Chronic systolic (congestive) heart failure: Secondary | ICD-10-CM | POA: Diagnosis not present

## 2019-10-06 DIAGNOSIS — I4821 Permanent atrial fibrillation: Secondary | ICD-10-CM | POA: Insufficient documentation

## 2019-10-06 DIAGNOSIS — E1159 Type 2 diabetes mellitus with other circulatory complications: Secondary | ICD-10-CM | POA: Insufficient documentation

## 2019-10-06 DIAGNOSIS — E119 Type 2 diabetes mellitus without complications: Secondary | ICD-10-CM | POA: Diagnosis not present

## 2019-10-06 DIAGNOSIS — E782 Mixed hyperlipidemia: Secondary | ICD-10-CM | POA: Insufficient documentation

## 2019-10-06 DIAGNOSIS — J90 Pleural effusion, not elsewhere classified: Secondary | ICD-10-CM | POA: Insufficient documentation

## 2019-10-06 DIAGNOSIS — R079 Chest pain, unspecified: Secondary | ICD-10-CM | POA: Diagnosis present

## 2019-10-06 DIAGNOSIS — Z6841 Body Mass Index (BMI) 40.0 and over, adult: Secondary | ICD-10-CM | POA: Diagnosis not present

## 2019-10-06 DIAGNOSIS — I10 Essential (primary) hypertension: Secondary | ICD-10-CM

## 2019-10-06 DIAGNOSIS — Z7901 Long term (current) use of anticoagulants: Secondary | ICD-10-CM | POA: Diagnosis not present

## 2019-10-06 DIAGNOSIS — N400 Enlarged prostate without lower urinary tract symptoms: Secondary | ICD-10-CM | POA: Insufficient documentation

## 2019-10-06 DIAGNOSIS — R0789 Other chest pain: Secondary | ICD-10-CM | POA: Diagnosis not present

## 2019-10-06 DIAGNOSIS — Z86718 Personal history of other venous thrombosis and embolism: Secondary | ICD-10-CM | POA: Diagnosis not present

## 2019-10-06 DIAGNOSIS — Z8249 Family history of ischemic heart disease and other diseases of the circulatory system: Secondary | ICD-10-CM | POA: Diagnosis not present

## 2019-10-06 DIAGNOSIS — I482 Chronic atrial fibrillation, unspecified: Secondary | ICD-10-CM | POA: Diagnosis not present

## 2019-10-06 DIAGNOSIS — I110 Hypertensive heart disease with heart failure: Secondary | ICD-10-CM | POA: Diagnosis not present

## 2019-10-06 DIAGNOSIS — E1140 Type 2 diabetes mellitus with diabetic neuropathy, unspecified: Secondary | ICD-10-CM

## 2019-10-06 LAB — BASIC METABOLIC PANEL
Anion gap: 13 (ref 5–15)
BUN: 18 mg/dL (ref 8–23)
CO2: 23 mmol/L (ref 22–32)
Calcium: 8.6 mg/dL — ABNORMAL LOW (ref 8.9–10.3)
Chloride: 99 mmol/L (ref 98–111)
Creatinine, Ser: 1.08 mg/dL (ref 0.61–1.24)
GFR calc Af Amer: >60 mL/min (ref >60)
GFR calc non Af Amer: >60 mL/min (ref >60)
Glucose, Bld: 309 mg/dL — ABNORMAL HIGH (ref 70–99)
Potassium: 4.4 mmol/L (ref 3.5–5.1)
Sodium: 135 mmol/L (ref 135–145)

## 2019-10-06 LAB — CBC
HCT: 44.9 % (ref 39.0–52.0)
Hemoglobin: 14.5 g/dL (ref 13.0–17.0)
MCH: 29.2 pg (ref 26.0–34.0)
MCHC: 32.3 g/dL (ref 30.0–36.0)
MCV: 90.5 fL (ref 80.0–100.0)
Platelets: 178 10*3/uL (ref 150–400)
RBC: 4.96 MIL/uL (ref 4.22–5.81)
RDW: 12.8 % (ref 11.5–15.5)
WBC: 6.7 10*3/uL (ref 4.0–10.5)
nRBC: 0 % (ref 0.0–0.2)

## 2019-10-06 LAB — LIPID PANEL
Cholesterol: 129 mg/dL (ref 0–200)
HDL: 32 mg/dL — ABNORMAL LOW (ref >40)
LDL Cholesterol: 79 mg/dL (ref 0–99)
Total CHOL/HDL Ratio: 4 RATIO
Triglycerides: 90 mg/dL (ref <150)
VLDL: 18 mg/dL (ref 0–40)

## 2019-10-06 LAB — HEMOGLOBIN A1C
Hgb A1c MFr Bld: 8.3 % — ABNORMAL HIGH (ref 4.8–5.6)
Mean Plasma Glucose: 191.51 mg/dL

## 2019-10-06 LAB — TROPONIN I (HIGH SENSITIVITY)
Troponin I (High Sensitivity): 9 ng/L (ref <18)
Troponin I (High Sensitivity): 9 ng/L (ref <18)

## 2019-10-06 LAB — GLUCOSE, CAPILLARY
Glucose-Capillary: 245 mg/dL — ABNORMAL HIGH (ref 70–99)
Glucose-Capillary: 172 mg/dL — ABNORMAL HIGH (ref 70–99)
Glucose-Capillary: 196 mg/dL — ABNORMAL HIGH (ref 70–99)

## 2019-10-06 LAB — TSH: TSH: 4.028 u[IU]/mL (ref 0.350–4.500)

## 2019-10-06 LAB — HIV ANTIBODY (ROUTINE TESTING W REFLEX): HIV Screen 4th Generation wRfx: NONREACTIVE

## 2019-10-06 MED ORDER — NITROGLYCERIN 0.4 MG SL SUBL
0.4000 mg | SUBLINGUAL_TABLET | SUBLINGUAL | Status: DC | PRN
  Administered 2019-10-06 (×3): 0.4 mg via SUBLINGUAL
  Filled 2019-10-06: qty 1

## 2019-10-06 MED ORDER — RIVAROXABAN 20 MG PO TABS
20.0000 mg | ORAL_TABLET | Freq: Every day | ORAL | Status: DC
  Administered 2019-10-07: 20 mg via ORAL
  Filled 2019-10-06: qty 1

## 2019-10-06 MED ORDER — SODIUM CHLORIDE 0.9% FLUSH: 3.0000 mL | Freq: Once | INTRAVENOUS | Status: DC

## 2019-10-06 MED ORDER — ONDANSETRON 4 MG PO TBDP
4.0000 mg | ORAL_TABLET | Freq: Three times a day (TID) | ORAL | Status: DC | PRN
  Administered 2019-10-06: 4 mg via ORAL
  Filled 2019-10-06 (×2): qty 1

## 2019-10-06 MED ORDER — MONTELUKAST SODIUM 10 MG PO TABS
10.0000 mg | ORAL_TABLET | Freq: Every day | ORAL | Status: DC
  Administered 2019-10-07: 10 mg via ORAL
  Filled 2019-10-06: qty 1

## 2019-10-06 MED ORDER — HYDROCODONE-ACETAMINOPHEN 5-325 MG PO TABS
1.0000 | ORAL_TABLET | ORAL | Status: DC | PRN
  Administered 2019-10-06: 2 via ORAL
  Filled 2019-10-06: qty 2

## 2019-10-06 MED ORDER — ACETAMINOPHEN 325 MG PO TABS
650.0000 mg | ORAL_TABLET | Freq: Four times a day (QID) | ORAL | Status: DC | PRN
  Administered 2019-10-06 – 2019-10-07 (×3): 650 mg via ORAL
  Filled 2019-10-06 (×3): qty 2

## 2019-10-06 MED ORDER — ONDANSETRON HCL 4 MG/2ML IJ SOLN: 4.0000 mg | Freq: Once | INTRAMUSCULAR | Status: DC

## 2019-10-06 MED ORDER — FUROSEMIDE 20 MG PO TABS
20.0000 mg | ORAL_TABLET | Freq: Every day | ORAL | Status: DC
  Administered 2019-10-07: 20 mg via ORAL
  Filled 2019-10-06: qty 1

## 2019-10-06 MED ORDER — NITROGLYCERIN 2 % TD OINT
0.5000 [in_us] | TOPICAL_OINTMENT | Freq: Four times a day (QID) | TRANSDERMAL | Status: DC
  Administered 2019-10-06 (×3): 0.5 [in_us] via TOPICAL
  Filled 2019-10-06 (×3): qty 1

## 2019-10-06 MED ORDER — METOPROLOL TARTRATE 50 MG PO TABS
50.0000 mg | ORAL_TABLET | Freq: Two times a day (BID) | ORAL | Status: DC
  Administered 2019-10-06 – 2019-10-07 (×3): 50 mg via ORAL
  Filled 2019-10-06 (×2): qty 1

## 2019-10-06 MED ORDER — MORPHINE SULFATE (PF) 4 MG/ML IV SOLN
4.0000 mg | Freq: Once | INTRAVENOUS | Status: AC
  Administered 2019-10-06: 4 mg via INTRAVENOUS
  Filled 2019-10-06: qty 1

## 2019-10-06 MED ORDER — BENAZEPRIL HCL 20 MG PO TABS
40.0000 mg | ORAL_TABLET | Freq: Every day | ORAL | Status: DC
  Administered 2019-10-07: 40 mg via ORAL
  Filled 2019-10-06: qty 2

## 2019-10-06 MED ORDER — ACETAMINOPHEN 650 MG RE SUPP: 650.0000 mg | Freq: Four times a day (QID) | RECTAL | Status: DC | PRN

## 2019-10-06 MED ORDER — INSULIN ASPART 100 UNIT/ML ~~LOC~~ SOLN
0.0000 [IU] | Freq: Three times a day (TID) | SUBCUTANEOUS | Status: DC
  Administered 2019-10-06: 3 [IU] via SUBCUTANEOUS
  Administered 2019-10-06 – 2019-10-07 (×2): 5 [IU] via SUBCUTANEOUS
  Administered 2019-10-07: 3 [IU] via SUBCUTANEOUS
  Filled 2019-10-06 (×4): qty 1

## 2019-10-06 MED ORDER — INSULIN ASPART 100 UNIT/ML ~~LOC~~ SOLN: 0.0000 [IU] | Freq: Every day | SUBCUTANEOUS | Status: DC

## 2019-10-06 MED ORDER — ASPIRIN EC 325 MG PO TBEC
325.0000 mg | DELAYED_RELEASE_TABLET | Freq: Every day | ORAL | Status: DC
  Administered 2019-10-07: 325 mg via ORAL
  Filled 2019-10-06: qty 1

## 2019-10-06 MED ORDER — ALBUTEROL SULFATE (2.5 MG/3ML) 0.083% IN NEBU: 2.5000 mg | INHALATION_SOLUTION | RESPIRATORY_TRACT | Status: DC | PRN

## 2019-10-06 MED ORDER — DOXAZOSIN MESYLATE 1 MG PO TABS
1.0000 mg | ORAL_TABLET | Freq: Two times a day (BID) | ORAL | Status: DC
  Administered 2019-10-06 – 2019-10-07 (×2): 1 mg via ORAL
  Filled 2019-10-06 (×3): qty 1

## 2019-10-06 MED ORDER — DILTIAZEM HCL ER COATED BEADS 120 MG PO CP24
120.0000 mg | ORAL_CAPSULE | Freq: Every day | ORAL | Status: DC
  Administered 2019-10-06 – 2019-10-07 (×2): 120 mg via ORAL
  Filled 2019-10-06 (×2): qty 1

## 2019-10-06 MED ORDER — SENNOSIDES-DOCUSATE SODIUM 8.6-50 MG PO TABS: 1.0000 | ORAL_TABLET | Freq: Every evening | ORAL | Status: DC | PRN

## 2019-10-06 MED ORDER — INSULIN ASPART 100 UNIT/ML ~~LOC~~ SOLN
4.0000 [IU] | Freq: Three times a day (TID) | SUBCUTANEOUS | Status: DC
  Administered 2019-10-06 – 2019-10-07 (×4): 4 [IU] via SUBCUTANEOUS
  Filled 2019-10-06 (×4): qty 1

## 2019-10-06 MED ORDER — ATORVASTATIN CALCIUM 10 MG PO TABS
10.0000 mg | ORAL_TABLET | Freq: Every day | ORAL | Status: DC
  Administered 2019-10-06 – 2019-10-07 (×2): 10 mg via ORAL
  Filled 2019-10-06 (×2): qty 1

## 2019-10-06 NOTE — Plan of Care (Signed)
  Problem: Education: Goal: Knowledge of General Education information will improve Description: Including pain rating scale, medication(s)/side effects and non-pharmacologic comfort measures Outcome: Progressing   Problem: Health Behavior/Discharge Planning: Goal: Ability to manage health-related needs will improve Outcome: Progressing   Problem: Clinical Measurements: Goal: Ability to maintain clinical measurements within normal limits will improve Outcome: Not Progressing Note: Hemoglobin A1C is elevated at 8.3. Will continue to monitor lab values for another 45 minutes. Wenda Low [facility]

## 2019-10-06 NOTE — ED Notes (Signed)
Patient transported to X-ray 

## 2019-10-06 NOTE — ED Triage Notes (Signed)
Noted chest pain when got up to bathroom at 3 am. Has decreased but still present.

## 2019-10-06 NOTE — ED Provider Notes (Signed)
[facility] Emergency Department Provider Note  ____________________________________________   First MD Initiated Contact with Patient 10/06/19 [phone]     (approximate)  I have reviewed the triage vital signs and the nursing notes.   HISTORY  Chief Complaint Chest Pain    HPI Jeff Aemon Koeller. is a 68 y.o. male with DVT, A. fib on Xarelto, CHF with EF of 40 to 45%, diabetes, hypertension who now presents with chest pain.  Chest pain started about 3 AM.  The pain was in the center of his chest, dull sensation, nonradiating, gradually improved on its own is now a 2 out of 10, nothing seemed to make it worse.  Denied any shortness of breath, new cough.  Was coronavirus positive back in October but has recovered from that.  Not on any supplemental oxygen.  He is already on a full dose aspirin as well as Xarelto for his blood clots.  He is not missing those doses.  Denies ever needing a catheterization or having a stress test done.  No numbness or tingling to his legs or arms.            Past Medical History:  Diagnosis Date  . Chronic deep vein thrombosis (DVT) (HCC)   . Chronic low back pain   . Chronic systolic heart failure (HCC)    a. TTE 2011 with EF 40-45% per notes  . Diabetes mellitus without complication (HCC)   . Erectile dysfunction   . HLD (hyperlipidemia)   . Hypertension   . Hypogonadism in male   . Permanent atrial fibrillation (HCC)    a. s/p DCCV 2011; b. redeveloped Afib in 2016; c. CHADS2VASc at least 5 (CHF, HTN, age x 1, DM, vascular disease); d. on Xarelto  . Torn meniscus    right knee    Patient Active Problem List   Diagnosis Date Noted  . Elevated hemoglobin (HCC) 09/07/2018  . Inferior vena caval stenosis 05/22/2018  . 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 Morbid obesity (HCC) 01/04/2018  . Chronic sinusitis 12/29/2017  . History of DVT (deep vein thrombosis) 12/17/2017  . Acute deep vein thrombosis (DVT) of femoral  vein of left lower extremity (HCC) 11/28/2017  . BPH (benign prostatic hyperplasia) 12/20/2016  . Advanced care planning/counseling discussion   . Erectile dysfunction 03/29/2015  . Hypogonadism in male 03/29/2015  . Mixed hyperlipidemia 02/02/2013  . Non-insulin dependent type 2 diabetes mellitus (HCC) 02/02/2013  . DVT of lower limb, acute (HCC) 11/08/2012  . EDEMA 06/03/2010  . HYPERTENSION, BENIGN 02/23/2010  . Congestive dilated cardiomyopathy (HCC) 02/23/2010  . A-fib (HCC) 02/20/2010    Past Surgical History:  Procedure Laterality Date  . COLONOSCOPY WITH PROPOFOL N/A 05/28/2016   Procedure: COLONOSCOPY WITH PROPOFOL;  Surgeon: Midge Minium, MD;  Location: [facility];  Service: Endoscopy;  Laterality: N/A;  diabetic - oral meds  . DVT, leg Left   . INSERTION OF VENA CAVA FILTER  2014  . KNEE ARTHROSCOPY WITH MEDIAL MENISECTOMY Right 05/31/2019   Procedure: KNEE ARTHROSCOPY WITH partial  MEDIAL MENISECTOMY;  Surgeon: Signa Kell, MD;  Location: [facility];  Service: Orthopedics;  Laterality: Right;  . PERIPHERAL VASCULAR THROMBECTOMY Left 12/01/2017   Procedure: PERIPHERAL VASCULAR THROMBECTOMY;  Surgeon: Renford Dills, MD;  Location: [facility];  Service: Cardiovascular;  Laterality: Left;    Prior to Admission medications   Medication Sig Start Date End Date Taking? Authorizing Provider  acetaminophen (TYLENOL) 500 MG tablet Take  2 tablets (1,000 mg total) by mouth every 8 (eight) hours. 05/31/19 05/30/20  Signa KellPatel, Sunny, MD  albuterol (VENTOLIN HFA) 108 (90 Base) MCG/ACT inhaler Inhale 2 puffs into the lungs every 4 (four) hours as needed for wheezing or shortness of breath. 08/11/19   Cuthriell, Delorise RoyalsJonathan D, PA-C  aspirin 325 MG tablet Take 325 mg by mouth daily.    [provider]  atorvastatin (LIPITOR) 20 MG tablet TAKE 1/2 TABLET BY MOUTH EVERY DAY 06/18/19   Steele Sizerrissman, Mark A, MD  benazepril (LOTENSIN) 40 MG tablet TAKE 1 TABLET BY MOUTH  EVERY DAY 07/30/19   Steele Sizerrissman, Mark A, MD  diltiazem (CARDIZEM CD) 120 MG 24 hr capsule Take 1 capsule (120 mg total) by mouth daily. 08/19/18 09/10/19  Adrian SaranMody, Sital, MD  doxazosin (CARDURA) 1 MG tablet Take 1 tablet (1 mg total) by mouth 2 (two) times daily. 09/10/19   Antonieta IbaGollan, Timothy J, MD  fluticasone (FLONASE) 50 MCG/ACT nasal spray Place into both nostrils daily as needed for allergies or rhinitis.    [provider]  furosemide (LASIX) 20 MG tablet Take 1 tablet (20 mg total) by mouth daily. 09/10/19 12/09/19  Antonieta IbaGollan, Timothy J, MD  glucose blood (CONTOUR NEXT TEST) test strip Use as instructed 08/27/19   Particia NearingLane, Rachel Elizabeth, PA-C  guaiFENesin (MUCINEX PO) Take by mouth as needed.    [provider]  HYDROcodone-acetaminophen (NORCO) 5-325 MG tablet Take 1-2 tablets by mouth every 4 (four) hours as needed for moderate pain or severe pain. 05/31/19   Signa KellPatel, Sunny, MD  metFORMIN (GLUCOPHAGE) 1000 MG tablet TAKE 1 TABLET (1,000 MG TOTAL) BY MOUTH 2 (TWO) TIMES DAILY WITH A MEAL. 04/30/19   Steele Sizerrissman, Mark A, MD  metoprolol tartrate (LOPRESSOR) 50 MG tablet TAKE 1 TABLET BY MOUTH TWICE A DAY 08/16/19   Crissman, Redge GainerMark A, MD  montelukast (SINGULAIR) 10 MG tablet Take 10 mg by mouth daily.  08/13/18   [provider]  ondansetron (ZOFRAN ODT) 4 MG disintegrating tablet Take 1 tablet (4 mg total) by mouth every 8 (eight) hours as needed for nausea or vomiting. 05/31/19   Signa KellPatel, Sunny, MD  potassium chloride SA (KLOR-CON) 20 MEQ tablet Take 1 tablet (20 mEq total) by mouth daily. 09/10/19   Antonieta IbaGollan, Timothy J, MD  rivaroxaban (XARELTO) 20 MG TABS tablet Take 1 tablet (20 mg total) by mouth daily. 08/27/19   Antonieta IbaGollan, Timothy J, MD    Allergies Patient has no known allergies.  Family History  Problem Relation Age of Onset  . Lymphoma Mother   . Heart failure Father   . Diabetes Father   . Diabetes Mellitus II Other   . Heart attack Neg Hx   . Hypertension Neg Hx   . Cancer Neg Hx    . COPD Neg Hx   . Stroke Neg Hx   . Prostate cancer Neg Hx   . Kidney cancer Neg Hx   . Bladder Cancer Neg Hx     Social History Social History   Tobacco Use  . Smoking status: Never Smoker  . Smokeless tobacco: Never Used  Substance Use Topics  . Alcohol use: No    Alcohol/week: 0.0 standard drinks  . Drug use: No      Review of Systems Constitutional: No fever/chills Eyes: No visual changes. ENT: No sore throat. Cardiovascular: Positive chest pain Respiratory: Denies shortness of breath. Gastrointestinal: No abdominal pain.  No nausea, no vomiting.  No diarrhea.  No constipation. Genitourinary: Negative for dysuria. Musculoskeletal:  Negative for back pain. Skin: Negative for rash. Neurological: Negative for headaches, focal weakness or numbness. All other ROS negative ____________________________________________   PHYSICAL EXAM:  VITAL SIGNS: Blood pressure (!) 181/126, pulse 95, temperature 97.8 F (36.6 C), temperature source Oral, resp. rate 18, height  (1.803 m), weight 127 kg, SpO2 97 %.  Constitutional: Alert and oriented. Well appearing and in no acute distress. Eyes: Conjunctivae are normal. EOMI. Head: Atraumatic. Nose: No congestion/rhinnorhea. Mouth/Throat: Mucous membranes are moist.   Neck: No stridor. Trachea Midline. FROM Cardiovascular: Normal rate, regular rhythm. Grossly normal heart sounds.  Good peripheral circulation.  No chest wall tenderness Respiratory: Normal respiratory effort.  No retractions. Lungs CTAB. Gastrointestinal: Soft and nontender. No distention. No abdominal bruits.  Musculoskeletal: No lower extremity tenderness nor edema.  No joint effusions. Neurologic:  Normal speech and language. No gross focal neurologic deficits are appreciated.  Skin:  Skin is warm, dry and intact. No rash noted. Psychiatric: Mood and affect are normal. Speech and behavior are normal. GU: Deferred    ____________________________________________   LABS (all labs ordered are listed, but only abnormal results are displayed)  Labs Reviewed  BASIC METABOLIC PANEL - Abnormal; Notable for the following components:      Result Value   Glucose, Bld 309 (*)    Calcium 8.6 (*)    All other components within normal limits  CBC  HIV ANTIBODY (ROUTINE TESTING W REFLEX)  HEMOGLOBIN A1C  TSH  LIPID PANEL  TROPONIN I (HIGH SENSITIVITY)  TROPONIN I (HIGH SENSITIVITY)   ____________________________________________   ED ECG REPORT I, Concha Se, the attending physician, personally viewed and interpreted this ECG.  EKG is atrial fibrillation rate of 94, no ST elevations, no T wave inversions, normal intervals ____________________________________________  RADIOLOGY Vela Prose, personally viewed and evaluated these images (plain radiographs) as part of my medical decision making, as well as reviewing the written report by the radiologist.  ED MD interpretation: no widen mediastinum   Official radiology report(s): Dg Chest 2 View  Result Date: 10/06/2019 CLINICAL DATA:  August 11, 2019 EXAM: CHEST - 2 VIEW COMPARISON:  August 11, 2019 FINDINGS: There is atelectatic change in the right base with small right pleural effusion. There is atelectatic change in the left mid lung. No airspace consolidation evident. Heart is upper normal in size with pulmonary vascularity normal. No adenopathy. There is an old healed fracture of the posterior left eighth rib. : No airspace consolidation or adenopathy. Areas of atelectatic change in the left mid lung and right base with small right pleural effusion. No edema or consolidation. Heart upper normal in size. No adenopathy evident. Electronically Signed   By: Bretta Bang III M.D.   On: 10/06/2019 09:23    ____________________________________________   PROCEDURES  Procedure(s) performed (including Critical Care):  Procedures    ____________________________________________   INITIAL IMPRESSION / ASSESSMENT AND PLAN / ED COURSE   Jeff Macalister Arnaud. was evaluated in Emergency Department on 10/06/2019 for the symptoms described in the history of present illness. He was evaluated in the context of the global COVID-19 pandemic, which necessitated consideration that the patient might be at risk for infection with the SARS-CoV-2 virus that causes COVID-19. Institutional protocols and algorithms that pertain to the evaluation of patients at risk for COVID-19 are in a state of rapid change based on information released by regulatory bodies including the CDC and federal and state organizations. These policies and algorithms were followed during the patient's  care in the ED.    Most Likely DDx:  - ACS given risk factors/age  DDx that was also considered d/t potential to cause harm, but was found less likely based on history and physical (as detailed above): -PNA (no fevers, cough but CXR to evaluate) -PNX (reassured with equal b/l breath sounds, CXR to evaluate) -Symptomatic anemia (will get H&H) -Pulmonary embolism as no sob at rest, not pleuritic in nature, no hypoxia, already on blood thinner -Aortic Dissection as no tearing pain and no radiation to the mid back, pulses equal, does have elevated blood pressure but no other symptoms of aortic dissection. -Pericarditis no rub on exam, EKG changes or hx to suggest dx -Tamponade (no notable SOB, tachycardic, hypotensive) -Esophageal rupture (no h/o diffuse vomitting/no crepitus)  Initial troponin is 9.  Patient describes it more as a pressure sensation at this time.  Still is a 3 out of 10.  We discussed repeat troponin discharge home with cardiology follow-up versus admission given heart score of 5.  Patient preferred to be admitted at this time.  Will discuss possible team for admission   Discussed the hospital team and they will admit patient.   ____________________________________________   FINAL CLINICAL IMPRESSION(S) / ED DIAGNOSES   Final diagnoses:  Chest pain, unspecified type     MEDICATIONS GIVEN DURING THIS VISIT:  Medications  sodium chloride flush (NS) 0.9 % injection 3 mL (has no administration in time range)  nitroGLYCERIN (NITROSTAT) SL tablet 0.4 mg (0.4 mg Sublingual Given 10/06/19 0925)  morphine 4 MG/ML injection 4 mg (has no administration in time range)  ondansetron (ZOFRAN) injection 4 mg (has no administration in time range)  diltiazem (CARDIZEM CD) 24 hr capsule 120 mg (has no administration in time range)  montelukast (SINGULAIR) tablet 10 mg (has no administration in time range)  aspirin tablet 325 mg (has no administration in time range)  ondansetron (ZOFRAN-ODT) disintegrating tablet 4 mg (has no administration in time range)  HYDROcodone-acetaminophen (NORCO/VICODIN) 5-325 MG per tablet 1-2 tablet (has no administration in time range)  atorvastatin (LIPITOR) tablet 10 mg (has no administration in time range)  benazepril (LOTENSIN) tablet 40 mg (has no administration in time range)  albuterol (VENTOLIN HFA) 108 (90 Base) MCG/ACT inhaler 2 puff (has no administration in time range)  metoprolol tartrate (LOPRESSOR) tablet 50 mg (has no administration in time range)  rivaroxaban (XARELTO) tablet 20 mg (has no administration in time range)  furosemide (LASIX) tablet 20 mg (has no administration in time range)  doxazosin (CARDURA) tablet 1 mg (has no administration in time range)  acetaminophen (TYLENOL) tablet 650 mg (has no administration in time range)    Or  acetaminophen (TYLENOL) suppository 650 mg (has no administration in time range)  senna-docusate (Senokot-S) tablet 1 tablet (has no administration in time range)  insulin aspart (novoLOG) injection 0-15 Units (has no administration in time range)  insulin aspart (novoLOG) injection 0-5 Units (has no administration in time range)  insulin  aspart (novoLOG) injection 4 Units (has no administration in time range)     ED Discharge Orders    None       Note:  This document was prepared using Dragon voice recognition software and may include unintentional dictation errors.   Vanessa [place], MD 10/06/19 1038

## 2019-10-06 NOTE — Consult Note (Signed)
Cardiology Consultation:   Patient ID: Jeff [name]. MRN: [id number]; DOB: Feb 24, 1951  Admit date: 10/06/2019 Date of Consult: 10/06/2019  Primary Care Provider: Guadalupe Maple, MD Primary Cardiologist: Ida Rogue, MD  Primary Electrophysiologist:  None    Patient Profile:   Jeff [name]. is a 68 y.o. male with a hx of atrial fib and DVT's who is being seen today for the evaluation of chest pain at the request of Dr. Reesa Chew.  History of Present Illness:   Jeff [name] is a pleasant 68 yo patient of Dr. Donivan Scull who has recently recovered from Covid 86. He has chronic atrial fib. He is on chronic xarelto. He has had poorly controlled and labile HTN. He has never had syncope. He awoke this morning at 0300 with 3/10 chest pain described as a pressure without radiation and not associated with sob. No back pain. He has chronic peripheral edema.   Heart Pathway Score:     Past Medical History:  Diagnosis Date  . Chronic deep vein thrombosis (DVT) (HCC)   . Chronic low back pain   . Chronic systolic heart failure ([place])    a. TTE 2011 with EF 40-45% per notes  . Diabetes mellitus without complication ([place])   . Erectile dysfunction   . HLD (hyperlipidemia)   . Hypertension   . Hypogonadism in male   . Permanent atrial fibrillation ([place])    a. s/p DCCV 2011; b. redeveloped Afib in 2016; c. CHADS2VASc at least 5 (CHF, HTN, age x 1, DM, vascular disease); d. on Xarelto  . Torn meniscus    right knee    Past Surgical History:  Procedure Laterality Date  . COLONOSCOPY WITH PROPOFOL N/A 05/28/2016   Procedure: COLONOSCOPY WITH PROPOFOL;  Surgeon: Lucilla Lame, MD;  Location: [facility];  Service: Endoscopy;  Laterality: N/A;  diabetic - oral meds  . DVT, leg Left   . INSERTION OF VENA CAVA FILTER  2014  . KNEE ARTHROSCOPY WITH MEDIAL MENISECTOMY Right 05/31/2019   Procedure: KNEE ARTHROSCOPY WITH partial  MEDIAL MENISECTOMY;  Surgeon: Leim Fabry, MD;  Location:  [facility];  Service: Orthopedics;  Laterality: Right;  . PERIPHERAL VASCULAR THROMBECTOMY Left 12/01/2017   Procedure: PERIPHERAL VASCULAR THROMBECTOMY;  Surgeon: Katha Cabal, MD;  Location: [facility];  Service: Cardiovascular;  Laterality: Left;     Home Medications:  Prior to Admission medications   Medication Sig Start Date End Date Taking? Authorizing Provider  acetaminophen (TYLENOL) 500 MG tablet Take 2 tablets (1,000 mg total) by mouth every 8 (eight) hours. 05/31/19 05/30/20  Leim Fabry, MD  albuterol (VENTOLIN HFA) 108 (90 Base) MCG/ACT inhaler Inhale 2 puffs into the lungs every 4 (four) hours as needed for wheezing or shortness of breath. 08/11/19   Cuthriell, Charline Bills, PA-C  aspirin 325 MG tablet Take 325 mg by mouth daily.    [provider]  atorvastatin (LIPITOR) 20 MG tablet TAKE 1/2 TABLET BY MOUTH EVERY DAY 06/18/19   Guadalupe Maple, MD  benazepril (LOTENSIN) 40 MG tablet TAKE 1 TABLET BY MOUTH EVERY DAY 07/30/19   Guadalupe Maple, MD  diltiazem (CARDIZEM CD) 120 MG 24 hr capsule Take 1 capsule (120 mg total) by mouth daily. 08/19/18 09/10/19  Bettey Costa, MD  doxazosin (CARDURA) 1 MG tablet Take 1 tablet (1 mg total) by mouth 2 (two) times daily. 09/10/19   Minna Merritts, MD  fluticasone (FLONASE) 50 MCG/ACT nasal spray Place into both  nostrils daily as needed for allergies or rhinitis.    [provider]  furosemide (LASIX) 20 MG tablet Take 1 tablet (20 mg total) by mouth daily. 09/10/19 12/09/19  Antonieta Iba, MD  glucose blood (CONTOUR NEXT TEST) test strip Use as instructed 08/27/19   Particia Nearing, PA-C  guaiFENesin (MUCINEX PO) Take by mouth as needed.    [provider]  HYDROcodone-acetaminophen (NORCO) 5-325 MG tablet Take 1-2 tablets by mouth every 4 (four) hours as needed for moderate pain or severe pain. 05/31/19   Signa Kell, MD  metFORMIN (GLUCOPHAGE) 1000 MG tablet TAKE 1 TABLET (1,000 MG  TOTAL) BY MOUTH 2 (TWO) TIMES DAILY WITH A MEAL. 04/30/19   Steele Sizer, MD  metoprolol tartrate (LOPRESSOR) 50 MG tablet TAKE 1 TABLET BY MOUTH TWICE A DAY 08/16/19   Crissman, Redge Gainer, MD  montelukast (SINGULAIR) 10 MG tablet Take 10 mg by mouth daily.  08/13/18   [provider]  ondansetron (ZOFRAN ODT) 4 MG disintegrating tablet Take 1 tablet (4 mg total) by mouth every 8 (eight) hours as needed for nausea or vomiting. 05/31/19   Signa Kell, MD  potassium chloride SA (KLOR-CON) 20 MEQ tablet Take 1 tablet (20 mEq total) by mouth daily. 09/10/19   Antonieta Iba, MD  rivaroxaban (XARELTO) 20 MG TABS tablet Take 1 tablet (20 mg total) by mouth daily. 08/27/19   Antonieta Iba, MD    Inpatient Medications: Scheduled Meds: . aspirin  325 mg Oral Daily  . atorvastatin  10 mg Oral Daily  . benazepril  40 mg Oral Daily  . diltiazem  120 mg Oral Daily  . doxazosin  1 mg Oral BID  . furosemide  20 mg Oral Daily  . insulin aspart  0-15 Units Subcutaneous TID WC  . insulin aspart  0-5 Units Subcutaneous QHS  . insulin aspart  4 Units Subcutaneous TID WC  . metoprolol tartrate  50 mg Oral BID  . montelukast  10 mg Oral Daily  . nitroGLYCERIN  0.5 inch Topical Q6H  . ondansetron (ZOFRAN) IV  4 mg Intravenous Once  . rivaroxaban  20 mg Oral Daily  . sodium chloride flush  3 mL Intravenous Once   Continuous Infusions:  PRN Meds: acetaminophen **OR** acetaminophen, albuterol, HYDROcodone-acetaminophen, nitroGLYCERIN, ondansetron, senna-docusate  Allergies:   No Known Allergies  Social History:   Social History   Socioeconomic History  . Marital status: Single    Spouse name: Not on file  . Number of children: Not on file  . Years of education: Not on file  . Highest education level: Some college, no degree  Occupational History  . 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 Financial resource strain: Not hard at all  . Food insecurity    Worry: Never true     Inability: Never true  . Transportation needs    Medical: No    Non-medical: No  Tobacco Use  . Smoking status: Never Smoker  . Smokeless tobacco: Never Used  Substance and Sexual Activity  . Alcohol use: No    Alcohol/week: 0.0 standard drinks  . Drug use: No  . Sexual activity: Not on file  Lifestyle  . Physical activity    Days per week: 3 days    Minutes per session: 150+ min  . Stress: Not at all  Relationships  . Social connections    Talks on phone: Twice a week    Gets together: More than  three times a week    Attends religious service: More than 4 times per year    Active member of club or organization: Yes    Attends meetings of clubs or organizations: More than 4 times per year    Relationship status: Living with partner  . Intimate partner violence    Fear of current or ex partner: No    Emotionally abused: No    Physically abused: No    Forced sexual activity: No  Other Topics Concern  . Not on file  Social History Narrative   Former Emergency planning/management officerpolice officer    Independent at baseline. [employer]   Works at [employer]     Family History:    Family History  Problem Relation Age of Onset  . Lymphoma Mother   . Heart failure Father   . Diabetes Father   . Diabetes Mellitus II Other   . Heart attack Neg Hx   . Hypertension Neg Hx   . Cancer Neg Hx   . COPD Neg Hx   . Stroke Neg Hx   . Prostate cancer Neg Hx   . Kidney cancer Neg Hx   . Bladder Cancer Neg Hx      ROS:  Please see the history of present illness.   All other ROS reviewed and negative.     Physical Exam/Data:   Vitals:   10/06/19 0932 10/06/19 1000 10/06/19 1030 10/06/19 1101  BP: 119/84 (!) 141/91 (!) 142/101 (!) 157/99  Pulse: 93 100 82 84  Resp:      Temp:      TempSrc:      SpO2: 93% 94% 98% (!) 87%  Weight:      Height:       No intake or output data in the 24 hours ending 10/06/19 1120 Last 3 Weights 10/06/2019 09/10/2019 08/11/2019  Weight (lbs) 280 lb 282 lb 8 oz  275 lb  Weight (kg) 127.007 kg 128.141 kg 124.739 kg     Body mass index is 39.05 kg/m.  General:  Well nourished, well developed, in no acute distress HEENT: normal Lymph: no adenopathy Neck: no JVD Endocrine:  No thryomegaly Vascular: No carotid bruits; FA pulses 2+ bilaterally without bruits  Cardiac:  normal S1, S2; RRR; no murmur  Lungs:  clear to auscultation bilaterally, no wheezing, rhonchi or rales  Abd: soft, nontender, no hepatomegaly  Ext: no edema Musculoskeletal:  No deformities, BUE and BLE strength normal and equal Skin: warm and dry  Neuro:  CNs 2-12 intact, no focal abnormalities noted Psych:  Normal affect   EKG:  The EKG was personally reviewed and demonstrates:  Atrial fib with a controlled VR with no STT changes Telemetry:  Telemetry was personally reviewed and demonstrates:  Atrial fib with a controlled VR  Relevant CV Studies: Negative troponin  Laboratory Data:  High Sensitivity Troponin:   Recent Labs  Lab 10/06/19 0838  TROPONINIHS 9     Chemistry Recent Labs  Lab 10/06/19 0838  NA 135  K 4.4  CL 99  CO2 23  GLUCOSE 309*  BUN 18  CREATININE 1.08  CALCIUM 8.6*  GFRNONAA >60  GFRAA >60  ANIONGAP 13    No results for input(s): PROT, ALBUMIN, AST, ALT, ALKPHOS, BILITOT in the last 168 hours. Hematology Recent Labs  Lab 10/06/19 0838  WBC 6.7  RBC 4.96  HGB 14.5  HCT 44.9  MCV 90.5  MCH 29.2  MCHC 32.3  RDW 12.8  PLT  178   BNPNo results for input(s): BNP, PROBNP in the last 168 hours.  DDimer No results for input(s): DDIMER in the last 168 hours.   Radiology/Studies:  Dg Chest 2 View  Result Date: 10/06/2019 CLINICAL DATA:  August 11, 2019 EXAM: CHEST - 2 VIEW COMPARISON:  August 11, 2019 FINDINGS: There is atelectatic change in the right base with small right pleural effusion. There is atelectatic change in the left mid lung. No airspace consolidation evident. Heart is upper normal in size with pulmonary vascularity  normal. No adenopathy. There is an old healed fracture of the posterior left eighth rib. : No airspace consolidation or adenopathy. Areas of atelectatic change in the left mid lung and right base with small right pleural effusion. No edema or consolidation. Heart upper normal in size. No adenopathy evident. Electronically Signed   By: Bretta Bang III M.D.   On: 10/06/2019 09:23    Assessment and Plan:   1. Atypical chest pain - his cardiac enzymes are initially negative. I doubt that this represents an acute coronary syndrome. I would admit for observation. No IV heparin.  2. Atrial fib - continue xarelto. His rates are controlled. 3. HTN - I have reviewed his log. He has uptitrated his meds and bp is still high. Continue metoprolol and diltiazem 4. Covid 19 - he has had no fever or active symptoms. His infection was in October.  For questions or updates, please contact [facility] Please consult [email or web address] for contact info under    Signed, Lewayne Bunting, MD  10/06/2019 11:20 AM

## 2019-10-06 NOTE — H&P (Signed)
History and Physical    [employer]her [employer]. MGQ:[id number] DOB: Feb 13, 1951 DOA: 10/06/2019  PCP: Guadalupe Maple, MD Patient coming from: Home  Chief Complaint: Chest pain  HPI: Jeff [name]. is a 68 y.o. male with medical history significant of DM2, HTN, systolic CHF EF 26%, A. fib on Xarelto, history of DVT, obesity, recent COVID-19 pneumonia not recovered presenting with chest pain.  Patient states he woke up this morning with substernal left-sided pressure-like chest pain with feeling of nausea but no other complaints.  Denies having similar symptoms in the past.  Seen by Dr. Rockey Situ from cardiology outpatient earlier this month for atrial fibrillation and systolic CHF management.  In the ER today his cardiac enzymes are negative.  EKG is unremarkable.  Ongoing mild substernal chest pressure-like pain therefore admitted for observation.  Cardiology notified.   Review of Systems: As per HPI otherwise 10 point review of systems negative.  Review of Systems Otherwise negative except as per HPI, including: General: Denies fever, chills, night sweats or unintended weight loss. Resp: Denies cough, wheezing, shortness of breath. Cardiac: Denies  palpitations, orthopnea, paroxysmal nocturnal dyspnea. GI: Denies abdominal pain, nausea, vomiting, diarrhea or constipation GU: Denies dysuria, frequency, hesitancy or incontinence MS: Denies muscle aches, joint pain or swelling Neuro: Denies headache, neurologic deficits (focal weakness, numbness, tingling), abnormal gait Psych: Denies anxiety, depression, SI/HI/AVH Skin: Denies new rashes or lesions ID: Denies sick contacts, exotic exposures, travel  Past Medical History:  Diagnosis Date  . Chronic deep vein thrombosis (DVT) (HCC)   . Chronic low back pain   . Chronic systolic heart failure ([place])    a. TTE 2011 with EF 40-45% per notes  . Diabetes mellitus without complication ([place])   . Erectile dysfunction   . HLD  (hyperlipidemia)   . Hypertension   . Hypogonadism in male   . Permanent atrial fibrillation ([place])    a. s/p DCCV 2011; b. redeveloped Afib in 2016; c. CHADS2VASc at least 5 (CHF, HTN, age x 1, DM, vascular disease); d. on Xarelto  . Torn meniscus    right knee    Past Surgical History:  Procedure Laterality Date  . COLONOSCOPY WITH PROPOFOL N/A 05/28/2016   Procedure: COLONOSCOPY WITH PROPOFOL;  Surgeon: Lucilla Lame, MD;  Location: [facility];  Service: Endoscopy;  Laterality: N/A;  diabetic - oral meds  . DVT, leg Left   . INSERTION OF VENA CAVA FILTER  2014  . KNEE ARTHROSCOPY WITH MEDIAL MENISECTOMY Right 05/31/2019   Procedure: KNEE ARTHROSCOPY WITH partial  MEDIAL MENISECTOMY;  Surgeon: Leim Fabry, MD;  Location: [facility];  Service: Orthopedics;  Laterality: Right;  . PERIPHERAL VASCULAR THROMBECTOMY Left 12/01/2017   Procedure: PERIPHERAL VASCULAR THROMBECTOMY;  Surgeon: Katha Cabal, MD;  Location: [facility];  Service: Cardiovascular;  Laterality: Left;    SOCIAL HISTORY:  reports that he has never smoked. He has never used smokeless tobacco. He reports that he does not drink alcohol or use drugs.  No Known Allergies  FAMILY HISTORY: Family History  Problem Relation Age of Onset  . Lymphoma Mother   . Heart failure Father   . Diabetes Father   . Diabetes Mellitus II Other   . Heart attack Neg Hx   . Hypertension Neg Hx   . Cancer Neg Hx   . COPD Neg Hx   . Stroke Neg Hx   . Prostate cancer Neg Hx   . Kidney cancer Neg Hx   .  Bladder Cancer Neg Hx      Prior to Admission medications   Medication Sig Start Date End Date Taking? Authorizing Provider  acetaminophen (TYLENOL) 500 MG tablet Take 2 tablets (1,000 mg total) by mouth every 8 (eight) hours. 05/31/19 05/30/20  Signa KellPatel, Sunny, MD  albuterol (VENTOLIN HFA) 108 (90 Base) MCG/ACT inhaler Inhale 2 puffs into the lungs every 4 (four) hours as needed for wheezing or shortness of  breath. 08/11/19   Cuthriell, Delorise RoyalsJonathan D, PA-C  aspirin 325 MG tablet Take 325 mg by mouth daily.    [provider]  atorvastatin (LIPITOR) 20 MG tablet TAKE 1/2 TABLET BY MOUTH EVERY DAY 06/18/19   Steele Sizerrissman, Mark A, MD  benazepril (LOTENSIN) 40 MG tablet TAKE 1 TABLET BY MOUTH EVERY DAY 07/30/19   Steele Sizerrissman, Mark A, MD  diltiazem (CARDIZEM CD) 120 MG 24 hr capsule Take 1 capsule (120 mg total) by mouth daily. 08/19/18 09/10/19  Adrian SaranMody, Sital, MD  doxazosin (CARDURA) 1 MG tablet Take 1 tablet (1 mg total) by mouth 2 (two) times daily. 09/10/19   Antonieta IbaGollan, Timothy J, MD  fluticasone (FLONASE) 50 MCG/ACT nasal spray Place into both nostrils daily as needed for allergies or rhinitis.    [provider]  furosemide (LASIX) 20 MG tablet Take 1 tablet (20 mg total) by mouth daily. 09/10/19 12/09/19  Antonieta IbaGollan, Timothy J, MD  glucose blood (CONTOUR NEXT TEST) test strip Use as instructed 08/27/19   Particia NearingLane, Rachel Elizabeth, PA-C  guaiFENesin (MUCINEX PO) Take by mouth as needed.    [provider]  HYDROcodone-acetaminophen (NORCO) 5-325 MG tablet Take 1-2 tablets by mouth every 4 (four) hours as needed for moderate pain or severe pain. 05/31/19   Signa KellPatel, Sunny, MD  metFORMIN (GLUCOPHAGE) 1000 MG tablet TAKE 1 TABLET (1,000 MG TOTAL) BY MOUTH 2 (TWO) TIMES DAILY WITH A MEAL. 04/30/19   Steele Sizerrissman, Mark A, MD  metoprolol tartrate (LOPRESSOR) 50 MG tablet TAKE 1 TABLET BY MOUTH TWICE A DAY 08/16/19   Crissman, Redge GainerMark A, MD  montelukast (SINGULAIR) 10 MG tablet Take 10 mg by mouth daily.  08/13/18   [provider]  ondansetron (ZOFRAN ODT) 4 MG disintegrating tablet Take 1 tablet (4 mg total) by mouth every 8 (eight) hours as needed for nausea or vomiting. 05/31/19   Signa KellPatel, Sunny, MD  potassium chloride SA (KLOR-CON) 20 MEQ tablet Take 1 tablet (20 mEq total) by mouth daily. 09/10/19   Antonieta IbaGollan, Timothy J, MD  rivaroxaban (XARELTO) 20 MG TABS tablet Take 1 tablet (20 mg total) by mouth daily. 08/27/19    Antonieta IbaGollan, Timothy J, MD    Physical Exam: Vitals:   10/06/19 0932 10/06/19 1000 10/06/19 1030 10/06/19 1101  BP: 119/84 (!) 141/91 (!) 142/101 (!) 157/99  Pulse: 93 100 82 84  Resp:      Temp:      TempSrc:      SpO2: 93% 94% 98% (!) 87%  Weight:      Height:          Constitutional: NAD, calm, comfortable Eyes: PERRL, lids and conjunctivae normal ENMT: Mucous membranes are moist. Posterior pharynx clear of any exudate or lesions.Normal dentition.  Neck: normal, supple, no masses, no thyromegaly Respiratory: clear to auscultation bilaterally, no wheezing, no crackles. Normal respiratory effort. No accessory muscle use.  Cardiovascular: Regular rate and rhythm, no murmurs / rubs / gallops. No extremity edema. 2+ pedal pulses. No carotid bruits.  Abdomen: no tenderness, no masses palpated. No hepatosplenomegaly. Bowel sounds positive.  Musculoskeletal: no clubbing / cyanosis. No joint deformity upper and lower extremities. Good ROM, no contractures. Normal muscle tone.  Skin: no rashes, lesions, ulcers. No induration Neurologic: CN 2-12 grossly intact. Sensation intact, DTR normal. Strength 5/5 in all 4.  Psychiatric: Normal judgment and insight. Alert and oriented x 3. Normal mood.     Labs on Admission: I have personally reviewed following labs and imaging studies  CBC: Recent Labs  Lab 10/06/19 0838  WBC 6.7  HGB 14.5  HCT 44.9  MCV 90.5  PLT 178   Basic Metabolic Panel: Recent Labs  Lab 10/06/19 0838  NA 135  K 4.4  CL 99  CO2 23  GLUCOSE 309*  BUN 18  CREATININE 1.08  CALCIUM 8.6*   GFR: Estimated Creatinine Clearance: 88.9 mL/min (by C-G formula based on SCr of 1.08 mg/dL). Liver Function Tests: No results for input(s): AST, ALT, ALKPHOS, BILITOT, PROT, ALBUMIN in the last 168 hours. No results for input(s): LIPASE, AMYLASE in the last 168 hours. No results for input(s): AMMONIA in the last 168 hours. Coagulation Profile: No results for input(s):  INR, PROTIME in the last 168 hours. Cardiac Enzymes: No results for input(s): CKTOTAL, CKMB, CKMBINDEX, TROPONINI in the last 168 hours. BNP (last 3 results) No results for input(s): PROBNP in the last 8760 hours. HbA1C: No results for input(s): HGBA1C in the last 72 hours. CBG: No results for input(s): GLUCAP in the last 168 hours. Lipid Profile: No results for input(s): CHOL, HDL, LDLCALC, TRIG, CHOLHDL, LDLDIRECT in the last 72 hours. Thyroid Function Tests: No results for input(s): TSH, T4TOTAL, FREET4, T3FREE, THYROIDAB in the last 72 hours. Anemia Panel: No results for input(s): VITAMINB12, FOLATE, FERRITIN, TIBC, IRON, RETICCTPCT in the last 72 hours. Urine analysis:    Component Value Date/Time   COLORURINE YELLOW (A) 08/11/2019 1943   APPEARANCEUR CLEAR (A) 08/11/2019 1943   APPEARANCEUR Clear 02/23/2018 1030   LABSPEC 1.023 08/11/2019 1943   PHURINE 5.0 08/11/2019 1943   GLUCOSEU >=500 (A) 08/11/2019 1943   HGBUR SMALL (A) 08/11/2019 1943   BILIRUBINUR NEGATIVE 08/11/2019 1943   BILIRUBINUR Negative 02/23/2018 1030   KETONESUR 20 (A) 08/11/2019 1943   PROTEINUR 100 (A) 08/11/2019 1943   NITRITE NEGATIVE 08/11/2019 1943   LEUKOCYTESUR NEGATIVE 08/11/2019 1943   Sepsis Labs: !!!!!!!!!!!!!!!!!!!!!!!!!!!!!!!!!!!!!!!!!!!! (procalcitonin:4,lacticidven:4) )No results found for this or any previous visit (from the past 240 hour(s)).   Radiological Exams on Admission: Dg Chest 2 View  Result Date: 10/06/2019 CLINICAL DATA:  August 11, 2019 EXAM: CHEST - 2 VIEW COMPARISON:  August 11, 2019 FINDINGS: There is atelectatic change in the right base with small right pleural effusion. There is atelectatic change in the left mid lung. No airspace consolidation evident. Heart is upper normal in size with pulmonary vascularity normal. No adenopathy. There is an old healed fracture of the posterior left eighth rib. : No airspace consolidation or adenopathy. Areas of  atelectatic change in the left mid lung and right base with small right pleural effusion. No edema or consolidation. Heart upper normal in size. No adenopathy evident. Electronically Signed   By: Bretta Bang III M.D.   On: 10/06/2019 09:23     All images have been reviewed by me personally.  EKG: Independently reviewed.  Normal sinus rhythm Assessment/Plan Principal Problem:   Chest pain Active Problems:   Atrial fibrillation, chronic (HCC)   Non-insulin dependent type 2 diabetes mellitus (HCC)   History of DVT (deep vein thrombosis)   Morbid obesity (  HCC)    Acute substernal chest pain -Admit the patient for observation.  Multiple risk factors and ongoing pressure-like chest pain. -Pain control, bowel regimen. -Cardiac enzymes -Negative, EKG-no acute ST-T changes -Patient follows Dr. Mariah Milling from cardiology.  Will await his input regarding next steps to see if patient would benefit from stress test versus left heart catheterization given his risk factors.  This can also be done electively as outpatient but will leave it up to his discretion. -Check TSH, lipid and A1c  Chronic atrial fibrillation -On Cardizem and Xarelto  Chronic systolic congestive heart failure, ejection fraction 40%, euvolemic -Aspirin, statin, metoprolol, daily Lasix  Diabetes mellitus type 2, non-insulin-dependent -Check hemoglobin A1c.  Hold off on home medication -Insulin sliding scale and Accu-Chek  History of DVT -Already on Xarelto  Recent COVID-19 -About a month ago.  Recovered.  Currently enrolled in clinical trial at [facility].    DVT prophylaxis: Xarelto Code Status: Full Family Communication:: None Disposition Plan:  to be determined Consults called: Messaged Dr. Mariah Milling from cardiology Admission status: Observation admission due to ongoing chest pain   Time Spent: 65 minutes.  >50% of the time was devoted to discussing the patients care, assessment, plan and disposition with other  care givers along with counseling the patient about the risks and benefits of treatment.    Kuron Docken Joline Maxcy MD [facility]  If 7PM-7AM, please contact night-coverage   10/06/2019, 11:13 AM

## 2019-10-07 ENCOUNTER — Encounter: Payer: Self-pay | Admitting: Internal Medicine

## 2019-10-07 DIAGNOSIS — Z86718 Personal history of other venous thrombosis and embolism: Secondary | ICD-10-CM | POA: Diagnosis not present

## 2019-10-07 DIAGNOSIS — I482 Chronic atrial fibrillation, unspecified: Secondary | ICD-10-CM | POA: Diagnosis not present

## 2019-10-07 DIAGNOSIS — I4891 Unspecified atrial fibrillation: Secondary | ICD-10-CM | POA: Diagnosis not present

## 2019-10-07 DIAGNOSIS — E119 Type 2 diabetes mellitus without complications: Secondary | ICD-10-CM | POA: Diagnosis not present

## 2019-10-07 DIAGNOSIS — R072 Precordial pain: Secondary | ICD-10-CM | POA: Diagnosis not present

## 2019-10-07 DIAGNOSIS — I10 Essential (primary) hypertension: Secondary | ICD-10-CM | POA: Diagnosis not present

## 2019-10-07 LAB — COMPREHENSIVE METABOLIC PANEL
ALT: 15 U/L (ref 0–44)
AST: 16 U/L (ref 15–41)
Albumin: 3.4 g/dL — ABNORMAL LOW (ref 3.5–5.0)
Alkaline Phosphatase: 44 U/L (ref 38–126)
Anion gap: 10 (ref 5–15)
BUN: 17 mg/dL (ref 8–23)
CO2: 24 mmol/L (ref 22–32)
Calcium: 8.3 mg/dL — ABNORMAL LOW (ref 8.9–10.3)
Chloride: 102 mmol/L (ref 98–111)
Creatinine, Ser: 1.1 mg/dL (ref 0.61–1.24)
GFR calc Af Amer: >60 mL/min (ref >60)
GFR calc non Af Amer: >60 mL/min (ref >60)
Glucose, Bld: 206 mg/dL — ABNORMAL HIGH (ref 70–99)
Potassium: 4.5 mmol/L (ref 3.5–5.1)
Sodium: 136 mmol/L (ref 135–145)
Total Bilirubin: 1.2 mg/dL (ref 0.3–1.2)
Total Protein: 5.8 g/dL — ABNORMAL LOW (ref 6.5–8.1)

## 2019-10-07 LAB — GLUCOSE, CAPILLARY
Glucose-Capillary: 219 mg/dL — ABNORMAL HIGH (ref 70–99)
Glucose-Capillary: 239 mg/dL — ABNORMAL HIGH (ref 70–99)

## 2019-10-07 LAB — CBC
HCT: 40.8 % (ref 39.0–52.0)
Hemoglobin: 13.3 g/dL (ref 13.0–17.0)
MCH: 29.4 pg (ref 26.0–34.0)
MCHC: 32.6 g/dL (ref 30.0–36.0)
MCV: 90.1 fL (ref 80.0–100.0)
Platelets: 162 10*3/uL (ref 150–400)
RBC: 4.53 MIL/uL (ref 4.22–5.81)
RDW: 12.8 % (ref 11.5–15.5)
WBC: 6.1 10*3/uL (ref 4.0–10.5)
nRBC: 0 % (ref 0.0–0.2)

## 2019-10-07 NOTE — Discharge Summary (Signed)
Physician Discharge Summary  [facility] Mare[facility] ZOX:[id number] DOB: [birth year] DOA: 10/06/2019  PCP: Steele Sizerrissman, Mark A, MD  Admit date: 10/06/2019 Discharge date: 10/07/2019  Discharge disposition: Home   Recommendations for Outpatient Follow-Up:   Follow-up with cardiologist, Dr. Mariah MillingGollan  Discharge Diagnosis:   Principal Problem:   Chest pain Active Problems:   Atrial fibrillation, chronic (HCC)   Non-insulin dependent type 2 diabetes mellitus (HCC)   History of DVT (deep vein thrombosis)   Morbid obesity (HCC)    Discharge Condition: Stable.  Diet recommendation: Low-salt, low sugar and low-fat diet  Code status: Full code    Hospital Course:   [name] is a 68 year old man with medical history significant for COVID-19 infection in October 2020, morbid obesity (BMI 41.2), type 2 diabetes mellitus, atrial fibrillation on Xarelto, hypertension history of DVT.  He presented to the hospital because of chest pain of 1 day duration.  He was admitted to telemetry for observation.  Serial troponins were negative.  EKG showed atrial fibrillation.  He was seen in consultation by the cardiologist, Dr. Ladona Ridgelaylor.  Dr. Ladona Ridgelaylor recommended outpatient follow-up with his regular cardiologist, Dr. Mariah MillingGollan. Patient is asymptomatic and he feels well enough to go home today.     Medical Consultants:    Dr. Lewayne BuntingGregg Taylor, cardiologist   Discharge Exam:   Vitals:   10/07/19 0415 10/07/19 0758  BP: (!) 132/91 (!) 149/92  Pulse: 89 70  Resp: 15 18  Temp: 98.1 F (36.7 C) 97.8 F (36.6 C)  SpO2: 95% 97%   Vitals:   10/06/19 1545 10/06/19 1939 10/07/19 0415 10/07/19 0758  BP: 119/75 135/89 (!) 132/91 (!) 149/92  Pulse: 71 76 89 70  Resp: 18 17 15 18   Temp: 97.6 F (36.4 C) 98.5 F (36.9 C) 98.1 F (36.7 C) 97.8 F (36.6 C)  TempSrc: Oral Oral Oral Oral  SpO2: 98% 96% 95% 97%  Weight:   130.3 kg   Height:         GEN: NAD SKIN: No rash EYES: EOMI ENT: MMM CV:  RRR PULM: CTA B ABD: soft, obese, NT, +BS CNS: AAO x 3, non focal EXT: No edema or tenderness   The results of significant diagnostics from this hospitalization (including imaging, microbiology, ancillary and laboratory) are listed below for reference.     Procedures and Diagnostic Studies:   Dg Chest 2 View  Result Date: 10/06/2019 CLINICAL DATA:  August 11, 2019 EXAM: CHEST - 2 VIEW COMPARISON:  August 11, 2019 FINDINGS: There is atelectatic change in the right base with small right pleural effusion. There is atelectatic change in the left mid lung. No airspace consolidation evident. Heart is upper normal in size with pulmonary vascularity normal. No adenopathy. There is an old healed fracture of the posterior left eighth rib. : No airspace consolidation or adenopathy. Areas of atelectatic change in the left mid lung and right base with small right pleural effusion. No edema or consolidation. Heart upper normal in size. No adenopathy evident. Electronically Signed   By: Bretta BangWilliam  Woodruff III M.D.   On: 10/06/2019 09:23     Labs:   Basic Metabolic Panel: Recent Labs  Lab 10/06/19 0838 10/07/19 0546  NA 135 136  K 4.4 4.5  CL 99 102  CO2 23 24  GLUCOSE 309* 206*  BUN 18 17  CREATININE 1.08 1.10  CALCIUM 8.6* 8.3*   GFR Estimated Creatinine Clearance: 87.2 mL/min (by C-G formula based on SCr of 1.1 mg/dL).  Liver Function Tests: Recent Labs  Lab 10/07/19 0546  AST 16  ALT 15  ALKPHOS 44  BILITOT 1.2  PROT 5.8*  ALBUMIN 3.4*   No results for input(s): LIPASE, AMYLASE in the last 168 hours. No results for input(s): AMMONIA in the last 168 hours. Coagulation profile No results for input(s): INR, PROTIME in the last 168 hours.  CBC: Recent Labs  Lab 10/06/19 0838 10/07/19 0546  WBC 6.7 6.1  HGB 14.5 13.3  HCT 44.9 40.8  MCV 90.5 90.1  PLT 178 162   Cardiac Enzymes: No results for input(s): CKTOTAL, CKMB, CKMBINDEX, TROPONINI in the last 168 hours. BNP:  Invalid input(s): POCBNP CBG: Recent Labs  Lab 10/06/19 1342 10/06/19 1707 10/06/19 2118 10/07/19 0759  GLUCAP 245* 172* 196* 219*   D-Dimer No results for input(s): DDIMER in the last 72 hours. Hgb A1c Recent Labs    10/06/19 1253  HGBA1C 8.3*   Lipid Profile Recent Labs    10/06/19 1253  CHOL 129  HDL 32*  LDLCALC 79  TRIG 90  CHOLHDL 4.0   Thyroid function studies Recent Labs    10/06/19 1253  TSH 4.028   Anemia work up No results for input(s): VITAMINB12, FOLATE, FERRITIN, TIBC, IRON, RETICCTPCT in the last 72 hours. Microbiology No results found for this or any previous visit (from the past 240 hour(s)).   Discharge Instructions:   Discharge Instructions    Diet - low sodium heart healthy   Complete by: As directed    Diet Carb Modified   Complete by: As directed    Increase activity slowly   Complete by: As directed      Allergies as of 10/07/2019   No Known Allergies     Medication List    TAKE these medications   acetaminophen 500 MG tablet Commonly known as: TYLENOL Take 2 tablets (1,000 mg total) by mouth every 8 (eight) hours.   albuterol 108 (90 Base) MCG/ACT inhaler Commonly known as: VENTOLIN HFA Inhale 2 puffs into the lungs every 4 (four) hours as needed for wheezing or shortness of breath.   aspirin 325 MG tablet Take 325 mg by mouth daily.   atorvastatin 20 MG tablet Commonly known as: LIPITOR TAKE 1/2 TABLET BY MOUTH EVERY DAY   benazepril 40 MG tablet Commonly known as: LOTENSIN TAKE 1 TABLET BY MOUTH EVERY DAY   diltiazem 120 MG 24 hr capsule Commonly known as: Cardizem CD Take 1 capsule (120 mg total) by mouth daily.   doxazosin 1 MG tablet Commonly known as: Cardura Take 1 tablet (1 mg total) by mouth 2 (two) times daily.   fluticasone 50 MCG/ACT nasal spray Commonly known as: FLONASE Place into both nostrils daily as needed for allergies or rhinitis.   furosemide 20 MG tablet Commonly known as: LASIX  Take 1 tablet (20 mg total) by mouth daily.   HYDROcodone-acetaminophen 5-325 MG tablet Commonly known as: Norco Take 1-2 tablets by mouth every 4 (four) hours as needed for moderate pain or severe pain.   metFORMIN 1000 MG tablet Commonly known as: GLUCOPHAGE TAKE 1 TABLET (1,000 MG TOTAL) BY MOUTH 2 (TWO) TIMES DAILY WITH A MEAL.   metoprolol tartrate 50 MG tablet Commonly known as: LOPRESSOR TAKE 1 TABLET BY MOUTH TWICE A DAY   montelukast 10 MG tablet Commonly known as: SINGULAIR Take 10 mg by mouth daily.   potassium chloride SA 20 MEQ tablet Commonly known as: KLOR-CON Take 1 tablet (20 mEq total) by mouth  daily. What changed: when to take this   rivaroxaban 20 MG Tabs tablet Commonly known as: Xarelto Take 1 tablet (20 mg total) by mouth daily.         Time coordinating discharge: 28 minutes  Signed:  Kenyatte Gruber  [facility] 10/07/2019, 11:11 AM

## 2019-10-07 NOTE — Progress Notes (Signed)
Patient discharged to home. Tele and IV d/c'd. Patient verbalizes understanding of discharge instructions. 

## 2019-10-07 NOTE — Progress Notes (Signed)
Progress Note  Patient Name: Jeff [name]. Date of Encounter: 10/07/2019  Primary Cardiologist: Ida Rogue, MD   Subjective   Denies additional chest pain.  Inpatient Medications    Scheduled Meds: . aspirin EC  325 mg Oral Daily  . atorvastatin  10 mg Oral Daily  . benazepril  40 mg Oral Daily  . diltiazem  120 mg Oral Daily  . doxazosin  1 mg Oral BID  . furosemide  20 mg Oral Daily  . insulin aspart  0-15 Units Subcutaneous TID WC  . insulin aspart  0-5 Units Subcutaneous QHS  . insulin aspart  4 Units Subcutaneous TID WC  . metoprolol tartrate  50 mg Oral BID  . montelukast  10 mg Oral Daily  . nitroGLYCERIN  0.5 inch Topical Q6H  . ondansetron (ZOFRAN) IV  4 mg Intravenous Once  . rivaroxaban  20 mg Oral Daily  . sodium chloride flush  3 mL Intravenous Once   Continuous Infusions:  PRN Meds: acetaminophen **OR** acetaminophen, albuterol, HYDROcodone-acetaminophen, nitroGLYCERIN, ondansetron, senna-docusate   Vital Signs    Vitals:   10/06/19 1545 10/06/19 1939 10/07/19 0415 10/07/19 0758  BP: 119/75 135/89 (!) 132/91 (!) 149/92  Pulse: 71 76 89 70  Resp: 18 17 15 18   Temp: 97.6 F (36.4 C) 98.5 F (36.9 C) 98.1 F (36.7 C) 97.8 F (36.6 C)  TempSrc: Oral Oral Oral Oral  SpO2: 98% 96% 95% 97%  Weight:   130.3 kg   Height:        Intake/Output Summary (Last 24 hours) at 10/07/2019 0817 Last data filed at 10/07/2019 0419 Gross per 24 hour  Intake -  Output 1000 ml  Net -1000 ml   Last 3 Weights 10/07/2019 10/06/2019 10/06/2019  Weight (lbs) 287 lb 4.8 oz 268 lb 9.6 oz 280 lb  Weight (kg) 130.318 kg 121.836 kg 127.007 kg      Telemetry    Atrial fib with a controlled VR- Personally Reviewed  ECG    None - Personally Reviewed  Physical Exam   GEN: No acute distress.   Neck: No JVD Cardiac: IRIRR, no murmurs, rubs, or gallops.  Respiratory: Clear to auscultation bilaterally. GI: Soft, nontender, non-distended  MS: No edema;  No deformity. Neuro:  Nonfocal  Psych: Normal affect   Labs    High Sensitivity Troponin:   Recent Labs  Lab 10/06/19 0838 10/06/19 1255  TROPONINIHS 9 9      Chemistry Recent Labs  Lab 10/06/19 0838 10/07/19 0546  NA 135 136  K 4.4 4.5  CL 99 102  CO2 23 24  GLUCOSE 309* 206*  BUN 18 17  CREATININE 1.08 1.10  CALCIUM 8.6* 8.3*  PROT  --  5.8*  ALBUMIN  --  3.4*  AST  --  16  ALT  --  15  ALKPHOS  --  44  BILITOT  --  1.2  GFRNONAA >60 >60  GFRAA >60 >60  ANIONGAP 13 10     Hematology Recent Labs  Lab 10/06/19 0838 10/07/19 0546  WBC 6.7 6.1  RBC 4.96 4.53  HGB 14.5 13.3  HCT 44.9 40.8  MCV 90.5 90.1  MCH 29.2 29.4  MCHC 32.3 32.6  RDW 12.8 12.8  PLT 178 162    BNPNo results for input(s): BNP, PROBNP in the last 168 hours.   DDimer No results for input(s): DDIMER in the last 168 hours.   Radiology    Dg Chest 2 View  Result Date: 10/06/2019 CLINICAL DATA:  August 11, 2019 EXAM: CHEST - 2 VIEW COMPARISON:  August 11, 2019 FINDINGS: There is atelectatic change in the right base with small right pleural effusion. There is atelectatic change in the left mid lung. No airspace consolidation evident. Heart is upper normal in size with pulmonary vascularity normal. No adenopathy. There is an old healed fracture of the posterior left eighth rib. : No airspace consolidation or adenopathy. Areas of atelectatic change in the left mid lung and right base with small right pleural effusion. No edema or consolidation. Heart upper normal in size. No adenopathy evident. Electronically Signed   By: Bretta Bang III M.D.   On: 10/06/2019 09:23    Cardiac Studies    Patient Profile    Jeff [name]. is a 68 y.o. male with a hx of atrial fib and DVT's who is being seen today for the evaluation of chest pain at the request of Dr. Nelson Chimes.  Assessment & Plan    1  CP   Atypical  For CAD. I suspect that GI is likely source  2  Atrial fibrillation -  Rate  controlled   On Xarelto. Continue  3  HTN  - his BP is up a bit.    4.  Hx COVID 19  October  Recovering    5. Displ - ok for DC home with followup with Dr. Mariah Milling.  For questions or updates, please contact [facility] Please consult [email or web address] for contact info under   Signed, Lewayne Bunting, MD  10/07/2019, 8:17 AM

## 2019-10-15 DIAGNOSIS — Z86718 Personal history of other venous thrombosis and embolism: Secondary | ICD-10-CM | POA: Diagnosis not present

## 2019-10-15 DIAGNOSIS — I82220 Acute embolism and thrombosis of inferior vena cava: Secondary | ICD-10-CM | POA: Diagnosis not present

## 2019-10-22 DIAGNOSIS — R69 Illness, unspecified: Secondary | ICD-10-CM | POA: Diagnosis not present

## 2019-10-26 ENCOUNTER — Other Ambulatory Visit: Payer: Self-pay

## 2019-10-26 DIAGNOSIS — E1159 Type 2 diabetes mellitus with other circulatory complications: Secondary | ICD-10-CM

## 2019-10-26 NOTE — Telephone Encounter (Signed)
Refill request for Metformin  LOV 08/29/2019

## 2019-10-30 MED ORDER — METFORMIN HCL 1000 MG PO TABS: ORAL_TABLET | ORAL | 1 refills | Status: DC

## 2019-11-10 NOTE — Progress Notes (Signed)
Evaluation Performed:  Follow-up visit  Date:  11/12/2019   ID:  Jeff [name]., DOB 26-Oct-1951, MRN [id number]  Patient Location:  [address] [place] [address]   Provider location:   [facility], [place] office  PCP:  Steele Sizer, MD  Cardiologist:  Fonnie Mu  Chief Complaint  Patient presents with  . office visit    2 month F/U; Meds verbally reviewed with patient.    History of Present Illness:    Jeff [name]. is a 69 y.o. male  past medical history of DM2  hemoglobin A1c 7.7 DVT LLE 2010, with filter hyperlipidemia  April 2011  atrial fibrillation with RVR,  emergency room on June 8 2011for rapid atrial fibrillation,  s/p cardioversion  lower extremity edema On calcium channel blockers,  Echo in 02/2010 shows EF 40-45%,  possibly tachycardia induced  Converted back to atrial fibrillation in 2016, at that time did not want cardioversion (CHF, HTN, age x 1, DM, vascular disease): IVC stenosis, followed by [facility]  he presents for routine followup for chronic atrial fibrillation, and extensive DVT  Seen in the ER, chest congestion  Reports on today's visit he is out of diltiazem Diltiazem was started in the hospital  BP 110 to 170 at home, labile  Has worsening lower extremity edema Weight up 10 pounds in the past month or 2 Eating more, drinking more  EKG personally reviewed by myself on todays visit Shows atrial fibrillation ventricular rate 99 bpm  Prior records reviewed with him at [facility] on 08/11/19 and diagnosed with COVID 19. He was started on prednisone which elevated his blood sugar.  He was not admitted. He went to [facility] ER on Tuesday 10/6 at 6 am and sat in the ER in a cubicle until 10 pm that night, even being COVID +.  stayed there 6 days and was started on remdesivir and antibodies trial diagnosed with pneumonia as well.  discharged/ quarantined for 20 days.   Other past medical history  reviewed Chest pain in ER 12/2018 Hospital records reviewed with the patient in detail SOB, and coughing Took Mucinex and Zyrtec.  He thinks maybe it is a reaction to both of the medicines  chest tightness and warmth associated with some clammy feeling. Had pressure 185/93 Pulse 127  08/2018  Sepsis secondary to cellulitis of the left lower extremity and streptococcus bacteremia: Hospital records reviewed with the patient in detail  History of DVT left lower extremity  thrombus was occlusive left SFA down to popliteal   IVC filter in place from 2010  As outpatient seen by Dr. Lorretta Harp, "too much clot" Recommended lymphedema compression pumps  Referred to [facility] for thrombectomy Reports that he had thrombectomy procedure , took hours 5 pm to 10 pm No anesthesia, very painful  He has had follow-up lower extremity CT scan at [facility] Confirming residual B/l extensive clot, iliacs extending downward    Prior CV studies:   The following studies were reviewed today:    Past Medical History:  Diagnosis Date  . Chronic deep vein thrombosis (DVT) (HCC)   . Chronic low back pain   . Chronic systolic heart failure (HCC)    a. TTE 2011 with EF 40-45% per notes  . Diabetes mellitus without complication (HCC)   . Erectile dysfunction   . HLD (hyperlipidemia)   . Hypertension   . Hypogonadism in male   . Permanent atrial fibrillation (  HCC)    a. s/p DCCV 2011; b. redeveloped Afib in 2016; c. CHADS2VASc at least 5 (CHF, HTN, age x 1, DM, vascular disease); d. on Xarelto  . Torn meniscus    right knee   Past Surgical History:  Procedure Laterality Date  . COLONOSCOPY WITH PROPOFOL N/A 05/28/2016   Procedure: COLONOSCOPY WITH PROPOFOL;  Surgeon: Midge Minium, MD;  Location: [facility];  Service: Endoscopy;  Laterality: N/A;  diabetic - oral meds  . DVT, leg Left   . INSERTION OF VENA CAVA FILTER  2014  . KNEE ARTHROSCOPY WITH MEDIAL MENISECTOMY Right 05/31/2019   Procedure:  KNEE ARTHROSCOPY WITH partial  MEDIAL MENISECTOMY;  Surgeon: Signa Kell, MD;  Location: [facility];  Service: Orthopedics;  Laterality: Right;  . PERIPHERAL VASCULAR THROMBECTOMY Left 12/01/2017   Procedure: PERIPHERAL VASCULAR THROMBECTOMY;  Surgeon: Renford Dills, MD;  Location: [facility] INVASIVE CV LAB;  Service: Cardiovascular;  Laterality: Left;     Current Meds  Medication Sig  . acetaminophen (TYLENOL) 500 MG tablet Take 2 tablets (1,000 mg total) by mouth every 8 (eight) hours.  Marland Kitchen albuterol (VENTOLIN HFA) 108 (90 Base) MCG/ACT inhaler Inhale 2 puffs into the lungs every 4 (four) hours as needed for wheezing or shortness of breath.  Marland Kitchen aspirin 325 MG tablet Take 325 mg by mouth daily.  Marland Kitchen atorvastatin (LIPITOR) 20 MG tablet TAKE 1/2 TABLET BY MOUTH EVERY DAY  . benazepril (LOTENSIN) 40 MG tablet TAKE 1 TABLET BY MOUTH EVERY DAY  . doxazosin (CARDURA) 1 MG tablet Take 1 tablet (1 mg total) by mouth 2 (two) times daily.  . fluticasone (FLONASE) 50 MCG/ACT nasal spray Place into both nostrils daily as needed for allergies or rhinitis.  . furosemide (LASIX) 20 MG tablet Take 1 tablet (20 mg total) by mouth daily.  Marland Kitchen HYDROcodone-acetaminophen (NORCO) 5-325 MG tablet Take 1-2 tablets by mouth every 4 (four) hours as needed for moderate pain or severe pain.  . metFORMIN (GLUCOPHAGE) 1000 MG tablet TAKE 1 TABLET (1,000 MG TOTAL) BY MOUTH 2 (TWO) TIMES DAILY WITH A MEAL.  . metoprolol tartrate (LOPRESSOR) 50 MG tablet TAKE 1 TABLET BY MOUTH TWICE A DAY  . montelukast (SINGULAIR) 10 MG tablet Take 10 mg by mouth daily.   . potassium chloride SA (KLOR-CON) 20 MEQ tablet Take 1 tablet (20 mEq total) by mouth daily. (Patient taking differently: Take 20 mEq by mouth every other day. )  . rivaroxaban (XARELTO) 20 MG TABS tablet Take 1 tablet (20 mg total) by mouth daily.     Allergies:   Patient has no known allergies.   Social History   Tobacco Use  . Smoking status: Never Smoker  .  Smokeless tobacco: Never Used  Substance Use Topics  . Alcohol use: No    Alcohol/week: 0.0 standard drinks  . Drug use: No     Family Hx: The patient's family history includes Diabetes in his father; Diabetes Mellitus II in an other family member; Heart failure in his father; Lymphoma in his mother. There is no history of Heart attack, Hypertension, Cancer, COPD, Stroke, Prostate cancer, Kidney cancer, or Bladder Cancer.  ROS:   Please see the history of present illness.    Review of Systems  Constitutional: Negative.   Respiratory: Negative.   Cardiovascular: Negative.   Gastrointestinal: Negative.   Musculoskeletal: Negative.   Neurological: Negative.   Psychiatric/Behavioral: Negative.   All other systems reviewed and are negative.    Labs/Other Tests and Data  Reviewed:    Recent Labs: 08/11/2019: B Natriuretic Peptide 209.0 10/06/2019: TSH 4.028 10/07/2019: ALT 15; BUN 17; Creatinine, Ser 1.10; Hemoglobin 13.3; Platelets 162; Potassium 4.5; Sodium 136   Recent Lipid Panel Lab Results  Component Value Date/Time   CHOL 129 10/06/2019 12:53 PM   CHOL 130 09/07/2018 08:22 AM   TRIG 90 10/06/2019 12:53 PM   TRIG 95 09/07/2018 08:22 AM   TRIG 111 02/17/2010 12:00 AM   HDL 32 (L) 10/06/2019 12:53 PM   HDL 39 (L) 02/23/2018 10:32 AM   CHOLHDL 4.0 10/06/2019 12:53 PM   LDLCALC 79 10/06/2019 12:53 PM   LDLCALC 49 02/23/2018 10:32 AM    Wt Readings from Last 3 Encounters:  11/12/19 292 lb (132.5 kg)  10/07/19 287 lb 4.8 oz (130.3 kg)  09/10/19 282 lb 8 oz (128.1 kg)     Exam:    Vital Signs: Vital signs may also be detailed in the HPI BP (!) 170/100 (BP Location: Left Arm, Patient Position: Sitting, Cuff Size: Normal)   Pulse 99   Ht 5\' 10"  (1.778 m)   Wt 292 lb (132.5 kg)   SpO2 97%   BMI 41.90 kg/m   Wt Readings from Last 3 Encounters:  11/12/19 292 lb (132.5 kg)  10/07/19 287 lb 4.8 oz (130.3 kg)  09/10/19 282 lb 8 oz (128.1 kg)   Temp Readings from Last  3 Encounters:  10/07/19 97.8 F (36.6 C) (Oral)  08/11/19 98.9 F (37.2 C) (Oral)  05/31/19 97.8 F (36.6 C)   BP Readings from Last 3 Encounters:  11/12/19 (!) 170/100  10/07/19 (!) 149/92  09/10/19 (!) 170/100   Pulse Readings from Last 3 Encounters:  11/12/19 99  10/07/19 70  09/10/19 95    Constitutional:  oriented to person, place, and time. No distress.  HENT:  Head: Grossly normal Eyes:  no discharge. No scleral icterus.  Neck: No JVD, no carotid bruits  Cardiovascular: Regular rate and rhythm, no murmurs appreciated 1+ pitting lower extremity edema to below the knees bilaterally Pulmonary/Chest: Clear to auscultation bilaterally, no wheezes or rales Abdominal: Soft.  no distension.  no tenderness.  Musculoskeletal: Normal range of motion Neurological:  normal muscle tone. Coordination normal. No atrophy Skin: Skin warm and dry Psychiatric: normal affect, pleasant   ASSESSMENT & PLAN:    Atrial fibrillation, unspecified type [facility] He has run out of his diltiazem Rate is elevated, recommend increase metoprolol tartrate up to 100 twice daily Increase Lasix up to 20 daily with potassium 20 daily  Congestive dilated cardiomyopathy (HCC) 10 pound weight gain in the past month or 2 Likely from overeating, fluid retention Recommend Lasix 20 daily as above Recommended fluid restriction Beta-blocker for better heart rate control  Type 2 diabetes mellitus with other circulatory complication, without long-term current use of insulin (HCC) Recommended strict diet Weight approaching 300 pounds, well above his ideal weight Diet discussed with him in detail  HYPERTENSION, BENIGN We recommended he increase metoprolol up to 100 twice daily, Cardura up to 2 mg twice daily, Lasix 20 daily with potassium 20  Mixed hyperlipidemia LDL now above goal, recommended weight loss  Acute renal failure, unspecified acute renal failure type ([place]) Long history of diabetes,   Recommend strict diet  Disposition: Follow-up in 6 months   Signed, Ida Rogue, MD  11/12/2019 2:36 PM    [place] [id number], [place], [place] [address]

## 2019-11-12 ENCOUNTER — Other Ambulatory Visit: Payer: Self-pay

## 2019-11-12 ENCOUNTER — Encounter: Payer: Self-pay | Admitting: Cardiovascular Disease

## 2019-11-12 ENCOUNTER — Ambulatory Visit: Payer: Medicare HMO | Admitting: Cardiovascular Disease

## 2019-11-12 VITALS — BP 135/90 | HR 99 | Ht 70.0 in | Wt 292.0 lb

## 2019-11-12 DIAGNOSIS — I420 Dilated cardiomyopathy: Secondary | ICD-10-CM

## 2019-11-12 DIAGNOSIS — I10 Essential (primary) hypertension: Secondary | ICD-10-CM

## 2019-11-12 DIAGNOSIS — I4891 Unspecified atrial fibrillation: Secondary | ICD-10-CM

## 2019-11-12 DIAGNOSIS — R600 Localized edema: Secondary | ICD-10-CM | POA: Diagnosis not present

## 2019-11-12 DIAGNOSIS — E1159 Type 2 diabetes mellitus with other circulatory complications: Secondary | ICD-10-CM

## 2019-11-12 DIAGNOSIS — E782 Mixed hyperlipidemia: Secondary | ICD-10-CM

## 2019-11-12 MED ORDER — POTASSIUM CHLORIDE CRYS ER 20 MEQ PO TBCR: 20.0000 meq | EXTENDED_RELEASE_TABLET | Freq: Every day | ORAL | 3 refills | Status: DC

## 2019-11-12 MED ORDER — METOPROLOL TARTRATE 100 MG PO TABS: 100.0000 mg | ORAL_TABLET | Freq: Two times a day (BID) | ORAL | 3 refills | Status: DC

## 2019-11-12 MED ORDER — DOXAZOSIN MESYLATE 2 MG PO TABS: 2.0000 mg | ORAL_TABLET | Freq: Two times a day (BID) | ORAL | 3 refills | Status: DC

## 2019-11-12 MED ORDER — FUROSEMIDE 20 MG PO TABS: 20.0000 mg | ORAL_TABLET | Freq: Every day | ORAL | 3 refills | Status: DC

## 2019-11-12 NOTE — Patient Instructions (Addendum)
Medication Instructions:   Please increase the metoprolol up to 100 mg Twice a day  Please increase the doxazosin up to 2 mg twice a day  Please increase the lasix up to 20 mg daily  Please increase the potassium up to 20 meq daily  If you need a refill on your cardiac medications before your next appointment, please call your pharmacy.   Lab work: No new labs needed   If you have labs (blood work) drawn today and your tests are completely normal, you will receive your results only by: Marland Kitchen MyChart Message (if you have MyChart) OR . A paper copy in the mail If you have any lab test that is abnormal or we need to change your treatment, we will call you to review the results.   Testing/Procedures: No new testing needed   Follow-Up: At [facility], you and your health needs are our priority.  As part of our continuing mission to provide you with exceptional heart care, we have created designated Provider Care Teams.  These Care Teams include your primary Cardiologist (physician) and Advanced Practice Providers (APPs -  Physician Assistants and Nurse Practitioners) who all work together to provide you with the care you need, when you need it.  . You will need a follow up appointment in 6 months   . Providers on your designated Care Team:   . Nicolasa Ducking, NP . Eula Listen, PA-C . Marisue Ivan, PA-C  Any Other Special Instructions Will Be Listed Below (If Applicable).  For educational health videos Log in to : [email or web address] Or : [email or web address], password : [email or web address]

## 2019-11-26 ENCOUNTER — Other Ambulatory Visit: Payer: Self-pay

## 2019-11-26 MED ORDER — RIVAROXABAN 20 MG PO TABS: 20.0000 mg | ORAL_TABLET | Freq: Every day | ORAL | 1 refills | Status: DC

## 2019-11-26 NOTE — Telephone Encounter (Signed)
Please review for refill. Thanks!  

## 2019-11-26 NOTE — Telephone Encounter (Signed)
Pt's age 69, wt 132.5 kg, SCr 1.1, CrCrl 120.45, last ov w/ TG 11/12/19.

## 2019-11-26 NOTE — Telephone Encounter (Signed)
*  STAT* If patient is at the pharmacy, call can be transferred to refill team.   1. Which medications need to be refilled? (please list name of each medication and dose if known) Xarelto  2. Which pharmacy/location (including street and city if local pharmacy) is medication to be sent to? [facility]  3. Do they need a 30 day or 90 day supply? 90

## 2019-11-28 ENCOUNTER — Telehealth: Payer: Self-pay | Admitting: Family Medicine

## 2019-11-28 NOTE — Telephone Encounter (Signed)
That's fine

## 2019-11-28 NOTE — Telephone Encounter (Signed)
Copied from CRM [id number]. Topic: General - Inquiry >> Nov 28, 2019 10:20 AM Jeff [name], NT wrote: Reason for CRM: Pt called in stating he would like to know who will be taking over as his PCP. Pt states he did have covid-19 and would like to update someone to see what to do from here for his care. Pt did see Dr.Simran Bomkamp for issue and was requesting to speak with an office manager or nurse to see who will take over his care. Please advise. >> Nov 28, 2019 10:40 AM [name] wrote: Scheduled appt for diabetes concerns 12/11/19. Pt is wanting Dr Laural Benes to become his primary. Please advice

## 2019-11-28 NOTE — Telephone Encounter (Signed)
Pt.notified

## 2019-12-11 ENCOUNTER — Encounter: Payer: Self-pay | Admitting: Family Medicine

## 2019-12-11 ENCOUNTER — Other Ambulatory Visit: Payer: Self-pay

## 2019-12-11 ENCOUNTER — Ambulatory Visit (INDEPENDENT_AMBULATORY_CARE_PROVIDER_SITE_OTHER): Payer: Medicare HMO | Admitting: Family Medicine

## 2019-12-11 ENCOUNTER — Other Ambulatory Visit: Payer: Self-pay | Admitting: Cardiovascular Disease

## 2019-12-11 VITALS — BP 171/103 | HR 75 | Temp 97.4°F | Ht 69.29 in | Wt 292.2 lb

## 2019-12-11 DIAGNOSIS — I420 Dilated cardiomyopathy: Secondary | ICD-10-CM

## 2019-12-11 DIAGNOSIS — I482 Chronic atrial fibrillation, unspecified: Secondary | ICD-10-CM

## 2019-12-11 DIAGNOSIS — I4891 Unspecified atrial fibrillation: Secondary | ICD-10-CM

## 2019-12-11 DIAGNOSIS — N401 Enlarged prostate with lower urinary tract symptoms: Secondary | ICD-10-CM | POA: Diagnosis not present

## 2019-12-11 DIAGNOSIS — E1159 Type 2 diabetes mellitus with other circulatory complications: Secondary | ICD-10-CM | POA: Diagnosis not present

## 2019-12-11 DIAGNOSIS — E782 Mixed hyperlipidemia: Secondary | ICD-10-CM

## 2019-12-11 DIAGNOSIS — I10 Essential (primary) hypertension: Secondary | ICD-10-CM | POA: Diagnosis not present

## 2019-12-11 DIAGNOSIS — D582 Other hemoglobinopathies: Secondary | ICD-10-CM

## 2019-12-11 DIAGNOSIS — I82412 Acute embolism and thrombosis of left femoral vein: Secondary | ICD-10-CM

## 2019-12-11 DIAGNOSIS — I82220 Acute embolism and thrombosis of inferior vena cava: Secondary | ICD-10-CM

## 2019-12-11 DIAGNOSIS — I82403 Acute embolism and thrombosis of unspecified deep veins of lower extremity, bilateral: Secondary | ICD-10-CM

## 2019-12-11 LAB — BAYER DCA HB A1C WAIVED: HB A1C (BAYER DCA - WAIVED): 9.6 % — ABNORMAL HIGH (ref <7.0)

## 2019-12-11 LAB — UA/M W/RFLX CULTURE, ROUTINE
Bilirubin, UA: NEGATIVE
Ketones, UA: NEGATIVE
Leukocytes,UA: NEGATIVE
Nitrite, UA: NEGATIVE
Protein,UA: NEGATIVE
RBC, UA: NEGATIVE
Specific Gravity, UA: 1.015 (ref 1.005–1.030)
Urobilinogen, Ur: 0.2 mg/dL (ref 0.2–1.0)
pH, UA: 5 (ref 5.0–7.5)

## 2019-12-11 LAB — MICROALBUMIN, URINE WAIVED
Creatinine, Urine Waived: 50 mg/dL (ref 10–300)
Microalb, Ur Waived: 80 mg/L — ABNORMAL HIGH (ref 0–19)

## 2019-12-11 MED ORDER — RIVAROXABAN 20 MG PO TABS: 20.0000 mg | ORAL_TABLET | Freq: Every day | ORAL | 5 refills | Status: DC

## 2019-12-11 NOTE — Assessment & Plan Note (Signed)
Under good control on current regimen. Continue current regimen. Continue to monitor. Call with any concerns. Refills given. Labs drawn today.   

## 2019-12-11 NOTE — Assessment & Plan Note (Signed)
Continue to work on diet and exercise with goal of losing 1-2 lbs per week. Call with any concerns. Starting Ozempic for DM

## 2019-12-11 NOTE — Telephone Encounter (Signed)
Patient calling to check on status Patient only has about 10 pills left Will call pharmacy to check on status of prescription arriving, will call back if needing some medication sent to local pharmacy

## 2019-12-11 NOTE — Assessment & Plan Note (Signed)
Follows with Cardiology. Stable. Continue blood thinner and metoprolol. Continue to monitor. 

## 2019-12-11 NOTE — Telephone Encounter (Signed)
*  STAT* If patient is at the pharmacy, call can be transferred to refill team.   1. Which medications need to be refilled? (please list name of each medication and dose if known) Xarelto 20 mg  2. Which pharmacy/location (including street and city if local pharmacy) is medication to be sent to? [facility] [place]  3. Do they need a 30 day or 90 day supply? 30

## 2019-12-11 NOTE — Assessment & Plan Note (Signed)
Follows with vascular. Stable. Continue blood thinner. Continue to monitor.

## 2019-12-11 NOTE — Telephone Encounter (Signed)
Refill request for Xarelto 

## 2019-12-11 NOTE — Progress Notes (Signed)
BP (!) 171/103   Pulse 75   Temp (!) 97.4 F (36.3 C)   Ht 5' 9.29" (1.76 m)   Wt 292 lb 4 oz (132.6 kg)   BMI 42.80 kg/m    Subjective:    Patient ID: Jeff [name]., male    DOB: 26-Mar-1951, 69 y.o.   MRN: [id number]  HPI: Jeff [name]. is a 69 y.o. male  Chief Complaint  Patient presents with  . Diabetes  . Hypertension  . Hyperlipidemia   HYPERTENSION / HYPERLIPIDEMIA Satisfied with current treatment? yes Duration of hypertension: chronic BP monitoring frequency: a few times a week BP range: 124/80-172/108 BP medication side effects: no Past BP meds: cardura, benazepril, metoprolol, lasix Duration of hyperlipidemia: chronic Cholesterol medication side effects: yes Cholesterol supplements: none Past cholesterol medications: atorvastatin Medication compliance: excellent compliance Aspirin: yes Recent stressors: yes Recurrent headaches: no Visual changes: no Palpitations: no Dyspnea: no Chest pain: no Lower extremity edema: no Dizzy/lightheaded: no  DIABETES Hypoglycemic episodes:no Polydipsia/polyuria: yes Visual disturbance: no Chest pain: no Paresthesias: yes Glucose Monitoring: no  Accucheck frequency: Not Checking Taking Insulin?: no Blood Pressure Monitoring: not checking Retinal Examination: Not up to Date Foot Exam: Not up to Date Diabetic Education: Completed Pneumovax: Up to Date Influenza: Up to Date Aspirin: yes  BPH BPH status: controlled Satisfied with current treatment?: yes Medication side effects: N/A Duration: chronic Nocturia: depends on when he takes his lasix Urinary frequency:yes Incomplete voiding: no Urgency: yes Weak urinary stream: no Straining to start stream: no Dysuria: no Onset: gradual Severity: mild  Relevant past medical, surgical, family and social history reviewed and updated as indicated. Interim medical history since our last visit reviewed. Allergies and medications reviewed and  updated.  Review of Systems  Constitutional: Negative.   Respiratory: Negative.   Cardiovascular: Negative.   Musculoskeletal: Negative.   Neurological: Negative.   Psychiatric/Behavioral: Negative.     Per HPI unless specifically indicated above     Objective:    BP (!) 171/103   Pulse 75   Temp (!) 97.4 F (36.3 C)   Ht 5' 9.29" (1.76 m)   Wt 292 lb 4 oz (132.6 kg)   BMI 42.80 kg/m   Wt Readings from Last 3 Encounters:  12/11/19 292 lb 4 oz (132.6 kg)  11/12/19 292 lb (132.5 kg)  10/07/19 287 lb 4.8 oz (130.3 kg)    Physical Exam Vitals and nursing note reviewed.  Constitutional:      General: He is not in acute distress.    Appearance: Normal appearance. He is not ill-appearing, toxic-appearing or diaphoretic.  HENT:     Head: Normocephalic and atraumatic.     Right Ear: External ear normal.     Left Ear: External ear normal.     Nose: Nose normal.     Mouth/Throat:     Mouth: Mucous membranes are moist.     Pharynx: Oropharynx is clear.  Eyes:     General: No scleral icterus.       Right eye: No discharge.        Left eye: No discharge.     Extraocular Movements: Extraocular movements intact.     Conjunctiva/sclera: Conjunctivae normal.     Pupils: Pupils are equal, round, and reactive to light.  Cardiovascular:     Rate and Rhythm: Normal rate and regular rhythm.     Pulses: Normal pulses.     Heart sounds: Normal heart sounds. No murmur. No friction rub.  No gallop.   Pulmonary:     Effort: Pulmonary effort is normal. No respiratory distress.     Breath sounds: Normal breath sounds. No stridor. No wheezing, rhonchi or rales.  Chest:     Chest wall: No tenderness.  Musculoskeletal:        General: Normal range of motion.     Cervical back: Normal range of motion and neck supple.  Skin:    General: Skin is warm and dry.     Capillary Refill: Capillary refill takes less than 2 seconds.     Coloration: Skin is not jaundiced or pale.     Findings: No  bruising, erythema, lesion or rash.  Neurological:     General: No focal deficit present.     Mental Status: He is alert and oriented to person, place, and time. Mental status is at baseline.  Psychiatric:        Mood and Affect: Mood normal.        Behavior: Behavior normal.        Thought Content: Thought content normal.        Judgment: Judgment normal.     Results for orders placed or performed during the hospital encounter of 10/06/19  Basic metabolic panel  Result Value Ref Range   Sodium 135 135 - 145 mmol/L   Potassium 4.4 3.5 - 5.1 mmol/L   Chloride 99 98 - 111 mmol/L   CO2 23 22 - 32 mmol/L   Glucose, Bld 309 (H) 70 - 99 mg/dL   BUN 18 8 - 23 mg/dL   Creatinine, Ser 0.98 0.61 - 1.24 mg/dL   Calcium 8.6 (L) 8.9 - 10.3 mg/dL   GFR calc non Af Amer >60 >60 mL/min   GFR calc Af Amer >60 >60 mL/min   Anion gap 13 5 - 15  CBC  Result Value Ref Range   WBC 6.7 4.0 - 10.5 K/uL   RBC 4.96 4.22 - 5.81 MIL/uL   Hemoglobin 14.5 13.0 - 17.0 g/dL   HCT 11.9 14.7 - 82.9 %   MCV 90.5 80.0 - 100.0 fL   MCH 29.2 26.0 - 34.0 pg   MCHC 32.3 30.0 - 36.0 g/dL   RDW 56.2 13.0 - 86.5 %   Platelets 178 150 - 400 K/uL   nRBC 0.0 0.0 - 0.2 %  HIV Antibody (routine testing w rflx)  Result Value Ref Range   HIV Screen 4th Generation wRfx NON REACTIVE NON REACTIVE  Hemoglobin A1c  Result Value Ref Range   Hgb A1c MFr Bld 8.3 (H) 4.8 - 5.6 %   Mean Plasma Glucose 191.51 mg/dL  TSH  Result Value Ref Range   TSH 4.028 0.350 - 4.500 uIU/mL  Lipid panel  Result Value Ref Range   Cholesterol 129 0 - 200 mg/dL   Triglycerides 90 <784 mg/dL   HDL 32 (L) >69 mg/dL   Total CHOL/HDL Ratio 4.0 RATIO   VLDL 18 0 - 40 mg/dL   LDL Cholesterol 79 0 - 99 mg/dL  Glucose, capillary  Result Value Ref Range   Glucose-Capillary 245 (H) 70 - 99 mg/dL  Comprehensive metabolic panel  Result Value Ref Range   Sodium 136 135 - 145 mmol/L   Potassium 4.5 3.5 - 5.1 mmol/L   Chloride 102 98 - 111 mmol/L    CO2 24 22 - 32 mmol/L   Glucose, Bld 206 (H) 70 - 99 mg/dL   BUN 17 8 - 23 mg/dL   Creatinine,  Ser 1.10 0.61 - 1.24 mg/dL   Calcium 8.3 (L) 8.9 - 10.3 mg/dL   Total Protein 5.8 (L) 6.5 - 8.1 g/dL   Albumin 3.4 (L) 3.5 - 5.0 g/dL   AST 16 15 - 41 U/L   ALT 15 0 - 44 U/L   Alkaline Phosphatase 44 38 - 126 U/L   Total Bilirubin 1.2 0.3 - 1.2 mg/dL   GFR calc non Af Amer >60 >60 mL/min   GFR calc Af Amer >60 >60 mL/min   Anion gap 10 5 - 15  CBC  Result Value Ref Range   WBC 6.1 4.0 - 10.5 K/uL   RBC 4.53 4.22 - 5.81 MIL/uL   Hemoglobin 13.3 13.0 - 17.0 g/dL   HCT 78.5 88.5 - 02.7 %   MCV 90.1 80.0 - 100.0 fL   MCH 29.4 26.0 - 34.0 pg   MCHC 32.6 30.0 - 36.0 g/dL   RDW 74.1 28.7 - 86.7 %   Platelets 162 150 - 400 K/uL   nRBC 0.0 0.0 - 0.2 %  Glucose, capillary  Result Value Ref Range   Glucose-Capillary 172 (H) 70 - 99 mg/dL  Glucose, capillary  Result Value Ref Range   Glucose-Capillary 196 (H) 70 - 99 mg/dL  Glucose, capillary  Result Value Ref Range   Glucose-Capillary 219 (H) 70 - 99 mg/dL  Glucose, capillary  Result Value Ref Range   Glucose-Capillary 239 (H) 70 - 99 mg/dL  Troponin I (High Sensitivity)  Result Value Ref Range   Troponin I (High Sensitivity) 9 <18 ng/L  Troponin I (High Sensitivity)  Result Value Ref Range   Troponin I (High Sensitivity) 9 <18 ng/L      Assessment & Plan:   Problem List Items Addressed This Visit      Cardiovascular and Mediastinum   HYPERTENSION, BENIGN - Primary    Not under good control. Running quite high, but quite labile. Will start ozempic to help with DM and that will hopefully help BP- work on diet and exercise with goal of losing weight. Continue to monitor. Call with any concerns.       Relevant Orders   Comprehensive metabolic panel   Microalbumin, Urine Waived   TSH   Congestive dilated cardiomyopathy (HCC)    Follows with Cardiology. Stable. Continue blood thinner and metoprolol. Continue to monitor.       Atrial fibrillation, chronic (HCC)    Follows with Cardiology. Stable. Continue blood thinner and metoprolol. Continue to monitor.      Acute deep vein thrombosis (DVT) of femoral vein of left lower extremity (HCC)    Follows with vascular. Stable. Continue blood thinner. Continue to monitor.      DVT of lower limb, acute (HCC)    Follows with vascular. Stable. Continue blood thinner. Continue to monitor.      IVC thrombosis (HCC)    Follows with vascular. Stable. Continue blood thinner. Continue to monitor.        Endocrine   Diabetes mellitus (HCC)    Not doing well with A1c up to 9.6. Will start ozempic, continue metformin and recheck 1 month. Call with any concerns.       Relevant Orders   Bayer DCA Hb A1c Waived   Comprehensive metabolic panel   TSH     Genitourinary   BPH (benign prostatic hyperplasia)    Under good control on current regimen. Continue current regimen. Continue to monitor. Call with any concerns. Labs drawn today  Relevant Orders   Comprehensive metabolic panel   PSA   UA/M w/rflx Culture, Routine     Other   Mixed hyperlipidemia    Under good control on current regimen. Continue current regimen. Continue to monitor. Call with any concerns. Refills given. Labs drawn today.       Relevant Orders   Comprehensive metabolic panel   Lipid Panel w/o Chol/HDL Ratio   Morbid obesity (HCC)    Continue to work on diet and exercise with goal of losing 1-2 lbs per week. Call with any concerns. Starting Ozempic for DM      Elevated hemoglobin (HCC)    Follows with Cardiology. Stable. Continue blood thinner and metoprolol. Continue to monitor. Rechecking labs today. Await results.        Other Visit Diagnoses    Atrial fibrillation, unspecified type (HCC)       Relevant Orders   CBC with Differential/Platelet   Comprehensive metabolic panel   TSH       Follow up plan: Return in about 4 weeks (around 01/08/2020) for follow up tolerance  on ozempic.

## 2019-12-11 NOTE — Assessment & Plan Note (Signed)
Not under good control. Running quite high, but quite labile. Will start ozempic to help with DM and that will hopefully help BP- work on diet and exercise with goal of losing weight. Continue to monitor. Call with any concerns.

## 2019-12-11 NOTE — Telephone Encounter (Signed)
Age 69, weight 132.6kg, SCr 1.1 on 10/07/19, CrCl > 100, last visit [name] 2021, afib indication.  Refill previously sent to mail order pharmacy a few days ago. Will also send rx in to local pharmacy per pt request.

## 2019-12-11 NOTE — Assessment & Plan Note (Signed)
Follows with Cardiology. Stable. Continue blood thinner and metoprolol. Continue to monitor. Rechecking labs today. Await results.

## 2019-12-11 NOTE — Assessment & Plan Note (Signed)
Follows with vascular. Stable. Continue blood thinner. Continue to monitor. 

## 2019-12-11 NOTE — Assessment & Plan Note (Signed)
Not doing well with A1c up to 9.6. Will start ozempic, continue metformin and recheck 1 month. Call with any concerns.

## 2019-12-11 NOTE — Assessment & Plan Note (Signed)
Under good control on current regimen. Continue current regimen. Continue to monitor. Call with any concerns. Labs drawn today.  

## 2019-12-11 NOTE — Assessment & Plan Note (Signed)
Follows with Cardiology. Stable. Continue blood thinner and metoprolol. Continue to monitor.

## 2019-12-12 LAB — COMPREHENSIVE METABOLIC PANEL
ALT: 13 IU/L (ref 0–44)
AST: 14 IU/L (ref 0–40)
Albumin/Globulin Ratio: 2.1 (ref 1.2–2.2)
Albumin: 4.6 g/dL (ref 3.8–4.8)
Alkaline Phosphatase: 75 IU/L (ref 39–117)
BUN/Creatinine Ratio: 15 (ref 10–24)
BUN: 22 mg/dL (ref 8–27)
Bilirubin Total: 0.7 mg/dL (ref 0.0–1.2)
CO2: 23 mmol/L (ref 20–29)
Calcium: 9.6 mg/dL (ref 8.6–10.2)
Chloride: 98 mmol/L (ref 96–106)
Creatinine, Ser: 1.42 mg/dL — ABNORMAL HIGH (ref 0.76–1.27)
GFR calc Af Amer: 58 mL/min/{1.73_m2} — ABNORMAL LOW (ref >59)
GFR calc non Af Amer: 50 mL/min/{1.73_m2} — ABNORMAL LOW (ref >59)
Globulin, Total: 2.2 g/dL (ref 1.5–4.5)
Glucose: 327 mg/dL — ABNORMAL HIGH (ref 65–99)
Potassium: 5.2 mmol/L (ref 3.5–5.2)
Sodium: 137 mmol/L (ref 134–144)
Total Protein: 6.8 g/dL (ref 6.0–8.5)

## 2019-12-12 LAB — TSH: TSH: 3.75 u[IU]/mL (ref 0.450–4.500)

## 2019-12-12 LAB — CBC WITH DIFFERENTIAL/PLATELET
Basophils Absolute: 0.1 10*3/uL (ref 0.0–0.2)
Basos: 1 %
EOS (ABSOLUTE): 0 10*3/uL (ref 0.0–0.4)
Eos: 1 %
Hematocrit: 52.8 % — ABNORMAL HIGH (ref 37.5–51.0)
Hemoglobin: 16.9 g/dL (ref 13.0–17.7)
Immature Grans (Abs): 0.1 10*3/uL (ref 0.0–0.1)
Immature Granulocytes: 1 %
Lymphocytes Absolute: 1 10*3/uL (ref 0.7–3.1)
Lymphs: 13 %
MCH: 28.8 pg (ref 26.6–33.0)
MCHC: 32 g/dL (ref 31.5–35.7)
MCV: 90 fL (ref 79–97)
Monocytes Absolute: 0.6 10*3/uL (ref 0.1–0.9)
Monocytes: 9 %
Neutrophils Absolute: 5.5 10*3/uL (ref 1.4–7.0)
Neutrophils: 75 %
Platelets: 178 10*3/uL (ref 150–450)
RBC: 5.86 x10E6/uL — ABNORMAL HIGH (ref 4.14–5.80)
RDW: 11.9 % (ref 11.6–15.4)
WBC: 7.2 10*3/uL (ref 3.4–10.8)

## 2019-12-12 LAB — LIPID PANEL W/O CHOL/HDL RATIO
Cholesterol, Total: 123 mg/dL (ref 100–199)
HDL: 30 mg/dL — ABNORMAL LOW (ref >39)
LDL Chol Calc (NIH): 74 mg/dL (ref 0–99)
Triglycerides: 97 mg/dL (ref 0–149)
VLDL Cholesterol Cal: 19 mg/dL (ref 5–40)

## 2019-12-12 LAB — PSA: Prostate Specific Ag, Serum: 1.3 ng/mL (ref 0.0–4.0)

## 2019-12-13 ENCOUNTER — Telehealth: Payer: Self-pay | Admitting: Family Medicine

## 2019-12-13 DIAGNOSIS — N289 Disorder of kidney and ureter, unspecified: Secondary | ICD-10-CM

## 2019-12-13 NOTE — Telephone Encounter (Signed)
Please let him know that his labs look good, but his kidney function is up a but. I want him to drink a whole bunch of water and come back in in 2 weeks for a recheck. (order in). Everythign else looks good. Thanks!

## 2019-12-13 NOTE — Telephone Encounter (Signed)
Left message for patient to return phone call.  

## 2019-12-14 NOTE — Telephone Encounter (Signed)
Patient notified lab results. Scheduled labs only for 12/25/19

## 2019-12-25 ENCOUNTER — Other Ambulatory Visit: Payer: Self-pay

## 2019-12-25 ENCOUNTER — Telehealth: Payer: Self-pay | Admitting: Family Medicine

## 2019-12-25 ENCOUNTER — Other Ambulatory Visit: Payer: Medicare HMO

## 2019-12-25 DIAGNOSIS — N289 Disorder of kidney and ureter, unspecified: Secondary | ICD-10-CM | POA: Diagnosis not present

## 2019-12-25 NOTE — Telephone Encounter (Signed)
Rozell Searing,  I apologize for his rudeness.  I will contact the patient to make him aware that we must ask these questions or will not be able to see him in office.  Thank you for making me aware.  Laurelyn Sickle

## 2019-12-25 NOTE — Telephone Encounter (Signed)
When I called Pt to do covid screening questions the day before Pt was upset I had to ask covid screening questions I did explain  I woud have to ask again In office.In office when I started to ask the covid screening questions he was loud,rued and stated he  would hang up on on [place] if we call for covid screening quesitons next time when we ask to do the screening on the phone.

## 2019-12-26 LAB — BASIC METABOLIC PANEL
BUN/Creatinine Ratio: 19 (ref 10–24)
BUN: 27 mg/dL (ref 8–27)
CO2: 24 mmol/L (ref 20–29)
Calcium: 9.6 mg/dL (ref 8.6–10.2)
Chloride: 98 mmol/L (ref 96–106)
Creatinine, Ser: 1.45 mg/dL — ABNORMAL HIGH (ref 0.76–1.27)
GFR calc Af Amer: 57 mL/min/{1.73_m2} — ABNORMAL LOW (ref >59)
GFR calc non Af Amer: 49 mL/min/{1.73_m2} — ABNORMAL LOW (ref >59)
Glucose: 224 mg/dL — ABNORMAL HIGH (ref 65–99)
Potassium: 5.8 mmol/L — ABNORMAL HIGH (ref 3.5–5.2)
Sodium: 141 mmol/L (ref 134–144)

## 2019-12-28 ENCOUNTER — Other Ambulatory Visit: Payer: Self-pay | Admitting: Family Medicine

## 2019-12-28 DIAGNOSIS — N1832 Chronic kidney disease, stage 3b: Secondary | ICD-10-CM

## 2020-01-07 ENCOUNTER — Other Ambulatory Visit: Payer: Self-pay | Admitting: Nephrology

## 2020-01-07 DIAGNOSIS — I10 Essential (primary) hypertension: Secondary | ICD-10-CM | POA: Diagnosis not present

## 2020-01-07 DIAGNOSIS — E875 Hyperkalemia: Secondary | ICD-10-CM | POA: Diagnosis not present

## 2020-01-07 DIAGNOSIS — R809 Proteinuria, unspecified: Secondary | ICD-10-CM | POA: Diagnosis not present

## 2020-01-07 DIAGNOSIS — IMO0002 Reserved for concepts with insufficient information to code with codable children: Secondary | ICD-10-CM

## 2020-01-07 DIAGNOSIS — E1129 Type 2 diabetes mellitus with other diabetic kidney complication: Secondary | ICD-10-CM

## 2020-01-07 DIAGNOSIS — I5042 Chronic combined systolic (congestive) and diastolic (congestive) heart failure: Secondary | ICD-10-CM | POA: Diagnosis not present

## 2020-01-07 DIAGNOSIS — N1831 Chronic kidney disease, stage 3a: Secondary | ICD-10-CM | POA: Insufficient documentation

## 2020-01-09 ENCOUNTER — Other Ambulatory Visit: Payer: Self-pay

## 2020-01-09 DIAGNOSIS — E782 Mixed hyperlipidemia: Secondary | ICD-10-CM

## 2020-01-09 NOTE — Telephone Encounter (Signed)
LOV: 12/11/19 with Olevia Perches, DO

## 2020-01-11 ENCOUNTER — Other Ambulatory Visit: Payer: Self-pay | Admitting: Family Medicine

## 2020-01-11 DIAGNOSIS — E782 Mixed hyperlipidemia: Secondary | ICD-10-CM

## 2020-01-11 MED ORDER — ATORVASTATIN CALCIUM 20 MG PO TABS: 10.0000 mg | ORAL_TABLET | Freq: Every day | ORAL | 1 refills | Status: DC

## 2020-01-14 ENCOUNTER — Other Ambulatory Visit: Payer: Self-pay

## 2020-01-14 ENCOUNTER — Ambulatory Visit
Admission: RE | Admit: 2020-01-14 | Discharge: 2020-01-14 | Disposition: A | Discharge status: Home / Self Care | Payer: Medicare HMO | Source: Ambulatory Visit | Attending: Nephrology | Admitting: Nephrology

## 2020-01-14 DIAGNOSIS — I10 Essential (primary) hypertension: Secondary | ICD-10-CM | POA: Diagnosis not present

## 2020-01-14 DIAGNOSIS — R809 Proteinuria, unspecified: Secondary | ICD-10-CM

## 2020-01-14 DIAGNOSIS — IMO0002 Reserved for concepts with insufficient information to code with codable children: Secondary | ICD-10-CM

## 2020-01-14 DIAGNOSIS — E1129 Type 2 diabetes mellitus with other diabetic kidney complication: Secondary | ICD-10-CM | POA: Insufficient documentation

## 2020-01-14 DIAGNOSIS — N1831 Chronic kidney disease, stage 3a: Secondary | ICD-10-CM

## 2020-01-14 DIAGNOSIS — E1165 Type 2 diabetes mellitus with hyperglycemia: Secondary | ICD-10-CM | POA: Insufficient documentation

## 2020-02-18 DIAGNOSIS — N1831 Chronic kidney disease, stage 3a: Secondary | ICD-10-CM | POA: Diagnosis not present

## 2020-02-18 DIAGNOSIS — R809 Proteinuria, unspecified: Secondary | ICD-10-CM | POA: Diagnosis not present

## 2020-02-18 DIAGNOSIS — I5042 Chronic combined systolic (congestive) and diastolic (congestive) heart failure: Secondary | ICD-10-CM | POA: Diagnosis not present

## 2020-02-18 DIAGNOSIS — E875 Hyperkalemia: Secondary | ICD-10-CM | POA: Diagnosis not present

## 2020-02-18 DIAGNOSIS — E1129 Type 2 diabetes mellitus with other diabetic kidney complication: Secondary | ICD-10-CM | POA: Diagnosis not present

## 2020-02-18 DIAGNOSIS — I10 Essential (primary) hypertension: Secondary | ICD-10-CM | POA: Diagnosis not present

## 2020-03-06 ENCOUNTER — Other Ambulatory Visit: Payer: Self-pay | Admitting: Cardiovascular Disease

## 2020-03-06 ENCOUNTER — Telehealth: Payer: Self-pay | Admitting: Cardiovascular Disease

## 2020-03-06 MED ORDER — METOPROLOL TARTRATE 100 MG PO TABS: 100.0000 mg | ORAL_TABLET | Freq: Two times a day (BID) | ORAL | 0 refills | Status: DC

## 2020-03-06 NOTE — Telephone Encounter (Signed)
Patient calling in stating that Dr. Wynelle Link advised him to stop taking lasix. Patient wanted to speak with Dr. Mariah Milling or nurse to be advised on whether to do this or not

## 2020-03-06 NOTE — Telephone Encounter (Signed)
Patient states he has been taking 100 mg 2 in morning and 2 in evening

## 2020-03-06 NOTE — Telephone Encounter (Signed)
Requested Prescriptions   Signed Prescriptions Disp Refills   metoprolol tartrate (LOPRESSOR) 100 MG tablet 180 tablet 0    Sig: Take 1 tablet (100 mg total) by mouth 2 (two) times daily.    Authorizing Provider: GOLLAN, TIMOTHY J    Ordering User: NEWCOMER MCCLAIN, Lynann Demetrius L    

## 2020-03-06 NOTE — Telephone Encounter (Signed)
*  STAT* If patient is at the pharmacy, call can be transferred to refill team.   1. Which medications need to be refilled? (please list name of each medication and dose if known) metoprolol tartrate 100 mg bid,   2. Which pharmacy/location (including street and city if local pharmacy) is [facility] sent to[facility] [place]  3. Do they need a 30 day or 90 day supply? 90

## 2020-03-07 NOTE — Telephone Encounter (Signed)
To Dr. Gollan to review.  

## 2020-03-09 NOTE — Telephone Encounter (Signed)
Lab work January 07 2020 he had normal renal function Does not appear he is having renal dysfunction from the Lasix I am concerned about the 10 pound weight gain he had several months ago requiring the Lasix for leg edema If he chooses to stop the Lasix would monitor weight daily Take Lasix for 3 pound weight gain

## 2020-03-10 NOTE — Telephone Encounter (Signed)
Left voicemail message.

## 2020-03-12 NOTE — Telephone Encounter (Signed)
Called and reviewed provider recommendations for his lasix. He stated that he did start swelling and called Dr. Alice Rieger office back and they told him to restart taking it. Patient reports he is taking this medication and has follow up appointments coming up with Dr. Wynelle Link and Dr. Mariah Milling soon and will check again at that time. He was appreciative for the call with no further questions at this time.

## 2020-03-12 NOTE — Telephone Encounter (Signed)
Left voicemail message.

## 2020-03-13 ENCOUNTER — Ambulatory Visit (INDEPENDENT_AMBULATORY_CARE_PROVIDER_SITE_OTHER): Payer: Medicare HMO | Admitting: Family Medicine

## 2020-03-13 ENCOUNTER — Encounter: Payer: Self-pay | Admitting: Family Medicine

## 2020-03-13 ENCOUNTER — Other Ambulatory Visit: Payer: Self-pay

## 2020-03-13 ENCOUNTER — Ambulatory Visit: Payer: Self-pay | Admitting: *Deleted

## 2020-03-13 VITALS — BP 108/69 | HR 79 | Temp 98.0°F | Ht 69.69 in | Wt 269.6 lb

## 2020-03-13 DIAGNOSIS — L03116 Cellulitis of left lower limb: Secondary | ICD-10-CM | POA: Diagnosis not present

## 2020-03-13 DIAGNOSIS — Z86718 Personal history of other venous thrombosis and embolism: Secondary | ICD-10-CM

## 2020-03-13 DIAGNOSIS — E782 Mixed hyperlipidemia: Secondary | ICD-10-CM

## 2020-03-13 DIAGNOSIS — I10 Essential (primary) hypertension: Secondary | ICD-10-CM | POA: Diagnosis not present

## 2020-03-13 DIAGNOSIS — E1159 Type 2 diabetes mellitus with other circulatory complications: Secondary | ICD-10-CM

## 2020-03-13 DIAGNOSIS — Z23 Encounter for immunization: Secondary | ICD-10-CM | POA: Diagnosis not present

## 2020-03-13 DIAGNOSIS — S81802A Unspecified open wound, left lower leg, initial encounter: Secondary | ICD-10-CM | POA: Diagnosis not present

## 2020-03-13 LAB — BAYER DCA HB A1C WAIVED: HB A1C (BAYER DCA - WAIVED): 9.1 % — ABNORMAL HIGH (ref <7.0)

## 2020-03-13 MED ORDER — SULFAMETHOXAZOLE-TRIMETHOPRIM 800-160 MG PO TABS: 1.0000 | ORAL_TABLET | Freq: Two times a day (BID) | ORAL | 0 refills | Status: DC

## 2020-03-13 MED ORDER — METFORMIN HCL 1000 MG PO TABS: ORAL_TABLET | ORAL | 1 refills | Status: DC

## 2020-03-13 MED ORDER — BENAZEPRIL HCL 40 MG PO TABS: 40.0000 mg | ORAL_TABLET | Freq: Every day | ORAL | 1 refills | Status: DC

## 2020-03-13 NOTE — Patient Instructions (Signed)
Td (Tetanus, Diphtheria) Vaccine: What You Need to Know 1. Why get vaccinated? Td vaccine can prevent tetanus and diphtheria. Tetanus enters the body through cuts or wounds. Diphtheria spreads from person to person.  TETANUS (T) causes painful stiffening of the muscles. Tetanus can lead to serious health problems, including being unable to open the mouth, having trouble swallowing and breathing, or death.  DIPHTHERIA (D) can lead to difficulty breathing, heart failure, paralysis, or death. 2. Td vaccine Td is only for children 7 years and older, adolescents, and adults.  Td is usually given as a booster dose every 10 years, but it can also be given earlier after a severe and dirty wound or burn. Another vaccine, called Tdap, that protects against pertussis, also known as "whooping cough," in addition to tetanus and diphtheria, may be used instead of Td.  Td may be given at the same time as other vaccines. 3. Talk with your health care provider Tell your vaccine provider if the person getting the vaccine:  Has had an allergic reaction after a previous dose of any vaccine that protects against tetanus or diphtheria, or has any severe, life-threatening allergies.  Has ever had Guillain-Barr Syndrome (also called GBS).  Has had severe pain or swelling after a previous dose of any vaccine that protects against tetanus or diphtheria. In some cases, your health care provider may decide to postpone Td vaccination to a future visit.  People with minor illnesses, such as a cold, may be vaccinated. People who are moderately or severely ill should usually wait until they recover before getting Td vaccine.  Your health care provider can give you more information. 4. Risks of a vaccine reaction  Pain, redness, or swelling where the shot was given, mild fever, headache, feeling tired, and nausea, vomiting, diarrhea, or stomachache sometimes happen after Td vaccine. People sometimes faint after medical  procedures, including vaccination. Tell your provider if you feel dizzy or have vision changes or ringing in the ears.  As with any medicine, there is a very remote chance of a vaccine causing a severe allergic reaction, other serious injury, or death. 5. What if there is a serious problem? An allergic reaction could occur after the vaccinated person leaves the clinic. If you see signs of a severe allergic reaction (hives, swelling of the face and throat, difficulty breathing, a fast heartbeat, dizziness, or weakness), call 9-1-1 and get the person to the nearest hospital.  For other signs that concern you, call your health care provider.  Adverse reactions should be reported to the Vaccine Adverse Event Reporting System (VAERS). Your health care provider will usually file this report, or you can do it yourself. Visit the VAERS website at www.vaers.hhs.gov or call 1-800-822-7967. VAERS is only for reporting reactions, and VAERS staff do not give medical advice. 6. The National Vaccine Injury Compensation Program The National Vaccine Injury Compensation Program (VICP) is a federal program that was created to compensate people who may have been injured by certain vaccines. Visit the VICP website at www.hrsa.gov/vaccinecompensation or call 1-800-338-2382 to learn about the program and about filing a claim. There is a time limit to file a claim for compensation. 7. How can I learn more?  Ask your health care provider.  Call your local or state health department.  Contact the Centers for Disease Control and Prevention (CDC): ? Call 1-800-232-4636 (1-800-CDC-INFO) or ? Visit CDC's website at www.cdc.gov/vaccines Vaccine Information Statement Td Vaccine (02/07/19) This information is not intended to replace advice given   to you by your health care provider. Make sure you discuss any questions you have with your health care provider. Document Revised: 03/19/2019 Document Reviewed: 02/19/2019 Elsevier  Patient Education  2020 [employer]  

## 2020-03-13 NOTE — Assessment & Plan Note (Signed)
Under good control on current regimen. Continue current regimen. Continue to monitor. Call with any concerns. Refills given. Labs drawn today.   

## 2020-03-13 NOTE — Telephone Encounter (Signed)
Patient was seen by cardiologist and given lasix- kidney doctor has discontinued. Legs are swelling - they do improve with elevation unless he is up and moving. Patient was put back on lasix- he is concerned about left leg weeping.  Reason for Disposition . SEVERE leg swelling (e.g., swelling extends above knee, entire leg is swollen, weeping fluid)  Answer Assessment - Initial Assessment Questions 1. ONSET: "When did the swelling start?" (e.g., minutes, hours, days)     1 month 2. LOCATION: "What part of the leg is swollen?"  "Are both legs swollen or just one leg?"     Bilateral- up to knees 3. SEVERITY: "How bad is the swelling?" (e.g., localized; mild, moderate, severe)  - Localized - small area of swelling localized to one leg  - MILD pedal edema - swelling limited to foot and ankle, pitting edema < 1/4 inch (6 mm) deep, rest and elevation eliminate most or all swelling  - MODERATE edema - swelling of lower leg to knee, pitting edema > 1/4 inch (6 mm) deep, rest and elevation only partially reduce swelling  - SEVERE edema - swelling extends above knee, facial or hand swelling present      moderate 4. REDNESS: "Does the swelling look red or infected?"     No redness 5. PAIN: "Is the swelling painful to touch?" If so, ask: "How painful is it?"   (Scale 1-10; mild, moderate or severe)     Not painful 6. FEVER: "Do you have a fever?" If so, ask: "What is it, how was it measured, and when did it start?"      no 7. CAUSE: "What do you think is causing the leg swelling?"     Retaining of fluid- not sure 8. MEDICAL HISTORY: "Do you have a history of heart failure, kidney disease, liver failure, or cancer?"     Heart disease, kidney disease 9. RECURRENT SYMPTOM: "Have you had leg swelling before?" If so, ask: "When was the last time?" "What happened that time?"     Yes- not this bad 10. OTHER SYMPTOMS: "Do you have any other symptoms?" (e.g., chest pain, difficulty breathing)       no 11.  PREGNANCY: "Is there any chance you are pregnant?" "When was your last menstrual period?"       n/a  Protocols used: LEG SWELLING AND EDEMA-A-AH

## 2020-03-13 NOTE — Progress Notes (Signed)
BP 108/69 (BP Location: Left Arm, Patient Position: Sitting, Cuff Size: Normal)   Pulse 79   Temp 98 F (36.7 C) (Oral)   Ht 5' 9.69" (1.77 m)   Wt 269 lb 9.6 oz (122.3 kg)   SpO2 97%   BMI 39.03 kg/m    Subjective:    Patient ID: Jeff Lips., male    DOB: 01-02-1951, 69 y.o.   MRN: [id number]  HPI: Jeff Varkey. is a 69 y.o. male  Chief Complaint  Patient presents with  . Leg Swelling    bilateral, weeping started this week.  . Hypertension  . Hyperlipidemia  . Diabetes   HYPERTENSION / HYPERLIPIDEMIA Satisfied with current treatment? yes Duration of hypertension: chronic BP medication side effects: no Past BP meds: metoprolol, amlodipine, lasix, cardura, benazepril Duration of hyperlipidemia: chronic Cholesterol medication side effects: no Cholesterol supplements: none Past cholesterol medications: atorvastatin Medication compliance: excellent compliance Aspirin: yes Recent stressors: no Recurrent headaches: no Visual changes: no Palpitations: no Dyspnea: no Chest pain: no Lower extremity edema: yes Dizzy/lightheaded: no  DIABETES- ozempic was not covered, so he did not get it. Has just been watching his diet Hypoglycemic episodes:no Polydipsia/polyuria: no Visual disturbance: no Chest pain: no Paresthesias: no Glucose Monitoring: yes  Accucheck frequency: Daily  Fasting glucose: 130-140 Taking Insulin?: no Blood Pressure Monitoring: not checking Retinal Examination: Not up to Date Foot Exam: Not up to Date Diabetic Education: Completed Pneumovax: Up to Date Influenza: Up to Date Aspirin: yes  SKIN INFECTION Duration: 2 weeks Location: L calf History of trauma in area: yes Pain: yes Quality: aching Severity: moderate Redness: yes Swelling: yes Oozing: no Pus: no Fevers: no Nausea/vomiting: no Status: stable Treatments attempted:warm compresses  Tetanus: Given today  Relevant past medical, surgical, family and social  history reviewed and updated as indicated. Interim medical history since our last visit reviewed. Allergies and medications reviewed and updated.  Review of Systems  Constitutional: Negative.   Respiratory: Negative.   Cardiovascular: Positive for leg swelling. Negative for chest pain and palpitations.  Gastrointestinal: Negative.   Musculoskeletal: Negative.   Skin: Positive for color change and wound. Negative for pallor and rash.  Psychiatric/Behavioral: Negative.     Per HPI unless specifically indicated above     Objective:    BP 108/69 (BP Location: Left Arm, Patient Position: Sitting, Cuff Size: Normal)   Pulse 79   Temp 98 F (36.7 C) (Oral)   Ht 5' 9.69" (1.77 m)   Wt 269 lb 9.6 oz (122.3 kg)   SpO2 97%   BMI 39.03 kg/m   Wt Readings from Last 3 Encounters:  03/13/20 269 lb 9.6 oz (122.3 kg)  12/11/19 292 lb 4 oz (132.6 kg)  11/12/19 292 lb (132.5 kg)    Physical Exam Vitals and nursing note reviewed.  Constitutional:      General: He is not in acute distress.    Appearance: Normal appearance. He is not ill-appearing, toxic-appearing or diaphoretic.  HENT:     Head: Normocephalic and atraumatic.     Right Ear: External ear normal.     Left Ear: External ear normal.     Nose: Nose normal.     Mouth/Throat:     Mouth: Mucous membranes are moist.     Pharynx: Oropharynx is clear.  Eyes:     General: No scleral icterus.       Right eye: No discharge.        Left eye: No discharge.  Extraocular Movements: Extraocular movements intact.     Conjunctiva/sclera: Conjunctivae normal.     Pupils: Pupils are equal, round, and reactive to light.  Cardiovascular:     Rate and Rhythm: Normal rate and regular rhythm.     Pulses: Normal pulses.     Heart sounds: Normal heart sounds. No murmur. No friction rub. No gallop.   Pulmonary:     Effort: Pulmonary effort is normal. No respiratory distress.     Breath sounds: Normal breath sounds. No stridor. No wheezing,  rhonchi or rales.  Chest:     Chest wall: No tenderness.  Musculoskeletal:        General: Normal range of motion.     Cervical back: Normal range of motion and neck supple.  Skin:    General: Skin is warm and dry.     Capillary Refill: Capillary refill takes less than 2 seconds.     Coloration: Skin is not jaundiced or pale.     Findings: Erythema (erythematous swollen L leg with about 1cm open weeping wound) present. No bruising, lesion or rash.  Neurological:     General: No focal deficit present.     Mental Status: He is alert and oriented to person, place, and time. Mental status is at baseline.  Psychiatric:        Mood and Affect: Mood normal.        Behavior: Behavior normal.        Thought Content: Thought content normal.        Judgment: Judgment normal.     Results for orders placed or performed in visit on 12/25/19  Basic metabolic panel  Result Value Ref Range   Glucose 224 (H) 65 - 99 mg/dL   BUN 27 8 - 27 mg/dL   Creatinine, Ser 2.75 (H) 0.76 - 1.27 mg/dL   GFR calc non Af Amer 49 (L) >59 mL/min/1.73   GFR calc Af Amer 57 (L) >59 mL/min/1.73   BUN/Creatinine Ratio 19 10 - 24   Sodium 141 134 - 144 mmol/L   Potassium 5.8 (H) 3.5 - 5.2 mmol/L   Chloride 98 96 - 106 mmol/L   CO2 24 20 - 29 mmol/L   Calcium 9.6 8.6 - 10.2 mg/dL      Assessment & Plan:   Problem List Items Addressed This Visit      Cardiovascular and Mediastinum   HYPERTENSION, BENIGN    Under good control on current regimen. Continue current regimen. Continue to monitor. Call with any concerns. Refills given. Labs drawn today.       Relevant Medications   amLODipine (NORVASC) 10 MG tablet   furosemide (LASIX) 20 MG tablet   benazepril (LOTENSIN) 40 MG tablet   Other Relevant Orders   Comprehensive metabolic panel     Endocrine   Diabetes mellitus (HCC)    Not under good control with A1c of 9.1- down from 9.6, will work with CCM pharmacy to see about weekly injectable. Recheck 3  months. Call with any concerns.       Relevant Medications   benazepril (LOTENSIN) 40 MG tablet   metFORMIN (GLUCOPHAGE) 1000 MG tablet   Other Relevant Orders   Comprehensive metabolic panel   Bayer DCA Hb T7G Waived   Referral to Chronic Care Management Services     Other   Mixed hyperlipidemia    Under good control on current regimen. Continue current regimen. Continue to monitor. Call with any concerns. Refills given. Labs drawn today.  Relevant Medications   amLODipine (NORVASC) 10 MG tablet   furosemide (LASIX) 20 MG tablet   benazepril (LOTENSIN) 40 MG tablet   Other Relevant Orders   Comprehensive metabolic panel   Lipid Panel w/o Chol/HDL Ratio   History of DVT (deep vein thrombosis)    Stable on eliquis. Checking CBC today. Call with any concerns.       Relevant Orders   CBC with Differential/Platelet   Comprehensive metabolic panel    Other Visit Diagnoses    Cellulitis of left lower extremity    -  Primary   Will treat with bactrim. Call if not getting better or getting worse. Continue to monitor.    Open wound of left lower leg, initial encounter       Td given today.   Relevant Orders   Td : Tetanus/diphtheria >7yo Preservative  free       Follow up plan: Return in about 3 months (around 06/13/2020).

## 2020-03-13 NOTE — Assessment & Plan Note (Signed)
Not under good control with A1c of 9.1- down from 9.6, will work with CCM pharmacy to see about weekly injectable. Recheck 3 months. Call with any concerns.

## 2020-03-13 NOTE — Assessment & Plan Note (Signed)
Stable on eliquis. Checking CBC today. Call with any concerns.

## 2020-03-14 ENCOUNTER — Telehealth: Payer: Self-pay | Admitting: Family Medicine

## 2020-03-14 LAB — COMPREHENSIVE METABOLIC PANEL
ALT: 18 IU/L (ref 0–44)
AST: 19 IU/L (ref 0–40)
Albumin/Globulin Ratio: 2.8 — ABNORMAL HIGH (ref 1.2–2.2)
Albumin: 4.7 g/dL (ref 3.8–4.8)
Alkaline Phosphatase: 67 IU/L (ref 39–117)
BUN/Creatinine Ratio: 20 (ref 10–24)
BUN: 26 mg/dL (ref 8–27)
Bilirubin Total: 0.7 mg/dL (ref 0.0–1.2)
CO2: 22 mmol/L (ref 20–29)
Calcium: 8.8 mg/dL (ref 8.6–10.2)
Chloride: 100 mmol/L (ref 96–106)
Creatinine, Ser: 1.32 mg/dL — ABNORMAL HIGH (ref 0.76–1.27)
GFR calc Af Amer: 63 mL/min/{1.73_m2} (ref >59)
GFR calc non Af Amer: 55 mL/min/{1.73_m2} — ABNORMAL LOW (ref >59)
Globulin, Total: 1.7 g/dL (ref 1.5–4.5)
Glucose: 213 mg/dL — ABNORMAL HIGH (ref 65–99)
Potassium: 4.3 mmol/L (ref 3.5–5.2)
Sodium: 140 mmol/L (ref 134–144)
Total Protein: 6.4 g/dL (ref 6.0–8.5)

## 2020-03-14 LAB — CBC WITH DIFFERENTIAL/PLATELET
Basophils Absolute: 0 10*3/uL (ref 0.0–0.2)
Basos: 1 %
EOS (ABSOLUTE): 0.1 10*3/uL (ref 0.0–0.4)
Eos: 1 %
Hematocrit: 44.7 % (ref 37.5–51.0)
Hemoglobin: 14.8 g/dL (ref 13.0–17.7)
Immature Grans (Abs): 0.1 10*3/uL (ref 0.0–0.1)
Immature Granulocytes: 1 %
Lymphocytes Absolute: 0.9 10*3/uL (ref 0.7–3.1)
Lymphs: 15 %
MCH: 28.9 pg (ref 26.6–33.0)
MCHC: 33.1 g/dL (ref 31.5–35.7)
MCV: 87 fL (ref 79–97)
Monocytes Absolute: 0.7 10*3/uL (ref 0.1–0.9)
Monocytes: 10 %
Neutrophils Absolute: 4.5 10*3/uL (ref 1.4–7.0)
Neutrophils: 72 %
Platelets: 190 10*3/uL (ref 150–450)
RBC: 5.12 x10E6/uL (ref 4.14–5.80)
RDW: 13 % (ref 11.6–15.4)
WBC: 6.2 10*3/uL (ref 3.4–10.8)

## 2020-03-14 LAB — LIPID PANEL W/O CHOL/HDL RATIO
Cholesterol, Total: 130 mg/dL (ref 100–199)
HDL: 30 mg/dL — ABNORMAL LOW (ref >39)
LDL Chol Calc (NIH): 71 mg/dL (ref 0–99)
Triglycerides: 166 mg/dL — ABNORMAL HIGH (ref 0–149)
VLDL Cholesterol Cal: 29 mg/dL (ref 5–40)

## 2020-03-14 NOTE — Chronic Care Management (AMB) (Signed)
  Chronic Care Management   Outreach Note  03/14/2020 Name: [name] [facility]. MRN: [id number] DOB: 1951/05/21  [name] Jeff [name]. is a 69 y.o. year old male who is a primary care patient of Dorcas Carrow, DO. I reached out to [employer]. by phone today in response to a referral sent by Mr. Harbert Fitterer Jr.'s PCP, Olevia Perches DO     An unsuccessful telephone outreach was attempted today. The patient was referred to the case management team for assistance with care management and care coordination.   Follow Up Plan: A HIPPA compliant phone message was left for the patient providing contact information and requesting a return call.  The care management team will reach out to the patient again over the next 7 days.  If patient returns call to provider office, please advise to call Embedded Care Management Care Guide Elisha Ponder LPN at [phone]  Elisha Ponder, LPN Health Advisor, Embedded Care Coordination [facility] Health Care Management ??[email or web address] ??[phone]

## 2020-03-21 NOTE — Chronic Care Management (AMB) (Signed)
  Chronic Care Management   Note  03/21/2020 Name: Jeff [name]. MRN: [id number] DOB: 1950/11/22  Jeff [name]. is a 69 y.o. year old male who is a primary care patient of Valerie Roys, DO. I reached out to Jeff [name]. by phone today in response to a referral sent by Mr. Zyler Hyson Jr.'s PCP, Park Liter DO     [name] was given information about Chronic Care Management services today including:  1. CCM service includes personalized support from designated clinical staff supervised by his physician, including individualized plan of care and coordination with other care providers 2. 24/7 contact phone numbers for assistance for urgent and routine care needs. 3. Service will only be billed when office clinical staff spend 20 minutes or more in a month to coordinate care. 4. Only one practitioner may furnish and bill the service in a calendar month. 5. The patient may stop CCM services at any time (effective at the end of the month) by phone call to the office staff. 6. The patient will be responsible for cost sharing (co-pay) of up to 20% of the service fee (after annual deductible is met).  Patient did not agree to enrollment in care management services and does not wish to consider at this time.  Follow up plan: The care management team is available to follow up with the patient after provider conversation with the patient regarding recommendation for care management engagement and subsequent re-referral to the care management team.   Glenna Durand, LPN Health Advisor, [facility] Management ??[email or web address] ??[phone]

## 2020-03-21 NOTE — Chronic Care Management (AMB) (Signed)
  Chronic Care Management   Outreach Note  03/21/2020 Name: Jeff [name] [facility]. MRN: [id number] DOB: [birth year]  Jeff [name]. is a 69 y.o. year old male who is a primary care patient of Dorcas Carrow, DO. I reached out to [employer]. by phone today in response to a referral sent by [name]'s PCP, Olevia Perches DO     A second unsuccessful telephone outreach was attempted today. The patient was referred to the case management team for assistance with care management and care coordination.   Follow Up Plan: A HIPPA compliant phone message was left for the patient providing contact information and requesting a return call.  The care management team will reach out to the patient again over the next 7 days.  If patient returns call to provider office, please advise to call Embedded Care Management Care Guide Elisha Ponder LPN at [phone]  Elisha Ponder, LPN Health Advisor, Embedded Care Coordination [facility] Health Care Management ??[email or web address] ??[phone]

## 2020-03-31 DIAGNOSIS — E1129 Type 2 diabetes mellitus with other diabetic kidney complication: Secondary | ICD-10-CM | POA: Diagnosis not present

## 2020-03-31 DIAGNOSIS — I5042 Chronic combined systolic (congestive) and diastolic (congestive) heart failure: Secondary | ICD-10-CM | POA: Diagnosis not present

## 2020-03-31 DIAGNOSIS — I10 Essential (primary) hypertension: Secondary | ICD-10-CM | POA: Diagnosis not present

## 2020-03-31 DIAGNOSIS — E875 Hyperkalemia: Secondary | ICD-10-CM | POA: Diagnosis not present

## 2020-03-31 DIAGNOSIS — R809 Proteinuria, unspecified: Secondary | ICD-10-CM | POA: Diagnosis not present

## 2020-03-31 DIAGNOSIS — N1831 Chronic kidney disease, stage 3a: Secondary | ICD-10-CM | POA: Diagnosis not present

## 2020-04-02 ENCOUNTER — Encounter: Payer: Self-pay | Admitting: Family Medicine

## 2020-04-02 ENCOUNTER — Ambulatory Visit (INDEPENDENT_AMBULATORY_CARE_PROVIDER_SITE_OTHER): Payer: Medicare HMO | Admitting: Family Medicine

## 2020-04-02 VITALS — BP 127/85 | HR 68

## 2020-04-02 DIAGNOSIS — J069 Acute upper respiratory infection, unspecified: Secondary | ICD-10-CM

## 2020-04-02 MED ORDER — HYDROCOD POLST-CPM POLST ER 10-8 MG/5ML PO SUER: 5.0000 mL | Freq: Two times a day (BID) | ORAL | 0 refills | Status: DC | PRN

## 2020-04-02 MED ORDER — PREDNISONE 50 MG PO TABS: 50.0000 mg | ORAL_TABLET | Freq: Every day | ORAL | 0 refills | Status: DC

## 2020-04-02 NOTE — Progress Notes (Signed)
BP 127/85   Pulse 68    Subjective:    Patient ID: Jeff Lips., male    DOB: 03-01-1951, 69 y.o.   MRN: [id number]  HPI: Jeff Gabriel. is a 69 y.o. male  Chief Complaint  Patient presents with  . URI    X 1 day, nasal drainage, post nasal drip and a cough   UPPER RESPIRATORY TRACT INFECTION Duration: 1 day Worst symptom: congestion, voice change Fever: no Cough: yes Shortness of breath: no Wheezing: no Chest pain: no Chest tightness: no Chest congestion: no Nasal congestion: yes Runny nose: yes Post nasal drip: yes Sneezing: no Sore throat: no Swollen glands: no Sinus pressure: no Headache: no Face pain: no Toothache: no Ear pain: no  Ear pressure: no  Eyes red/itching:no Eye drainage/crusting: no  Vomiting: no Rash: no Fatigue: no Sick contacts: no Strep contacts: no  Context: stable Recurrent sinusitis: no Relief with OTC cold/cough medications: no  Treatments attempted: flonase   Relevant past medical, surgical, family and social history reviewed and updated as indicated. Interim medical history since our last visit reviewed. Allergies and medications reviewed and updated.  Review of Systems  Constitutional: Negative.   HENT: Positive for congestion, postnasal drip and rhinorrhea. Negative for dental problem, drooling, ear discharge, ear pain, facial swelling, hearing loss, mouth sores, nosebleeds, sinus pressure, sinus pain, sneezing, sore throat, tinnitus, trouble swallowing and voice change.   Eyes: Negative.   Respiratory: Positive for cough. Negative for apnea, choking, chest tightness, shortness of breath, wheezing and stridor.   Cardiovascular: Negative.   Gastrointestinal: Negative.   Psychiatric/Behavioral: Negative.     Per HPI unless specifically indicated above     Objective:    BP 127/85   Pulse 68   Wt Readings from Last 3 Encounters:  03/13/20 269 lb 9.6 oz (122.3 kg)  12/11/19 292 lb 4 oz (132.6 kg)  11/12/19  292 lb (132.5 kg)    Physical Exam Vitals and nursing note reviewed.  Pulmonary:     Effort: Pulmonary effort is normal. No respiratory distress.     Comments: Speaking in full sentences Neurological:     Mental Status: He is alert.  Psychiatric:        Mood and Affect: Mood normal.        Behavior: Behavior normal.        Thought Content: Thought content normal.        Judgment: Judgment normal.     Results for orders placed or performed in visit on 03/13/20  CBC with Differential/Platelet  Result Value Ref Range   WBC 6.2 3.4 - 10.8 x10E3/uL   RBC 5.12 4.14 - 5.80 x10E6/uL   Hemoglobin 14.8 13.0 - 17.7 g/dL   Hematocrit 44.7 37.5 - 51.0 %   MCV 87 79 - 97 fL   MCH 28.9 26.6 - 33.0 pg   MCHC 33.1 31.5 - 35.7 g/dL   RDW 13.0 11.6 - 15.4 %   Platelets 190 150 - 450 x10E3/uL   Neutrophils 72 Not Estab. %   Lymphs 15 Not Estab. %   Monocytes 10 Not Estab. %   Eos 1 Not Estab. %   Basos 1 Not Estab. %   Neutrophils Absolute 4.5 1.4 - 7.0 x10E3/uL   Lymphocytes Absolute 0.9 0.7 - 3.1 x10E3/uL   Monocytes Absolute 0.7 0.1 - 0.9 x10E3/uL   EOS (ABSOLUTE) 0.1 0.0 - 0.4 x10E3/uL   Basophils Absolute 0.0 0.0 - 0.2 x10E3/uL  Immature Granulocytes 1 Not Estab. %   Immature Grans (Abs) 0.1 0.0 - 0.1 x10E3/uL  Comprehensive metabolic panel  Result Value Ref Range   Glucose 213 (H) 65 - 99 mg/dL   BUN 26 8 - 27 mg/dL   Creatinine, Ser 9.32 (H) 0.76 - 1.27 mg/dL   GFR calc non Af Amer 55 (L) >59 mL/min/1.73   GFR calc Af Amer 63 >59 mL/min/1.73   BUN/Creatinine Ratio 20 10 - 24   Sodium 140 134 - 144 mmol/L   Potassium 4.3 3.5 - 5.2 mmol/L   Chloride 100 96 - 106 mmol/L   CO2 22 20 - 29 mmol/L   Calcium 8.8 8.6 - 10.2 mg/dL   Total Protein 6.4 6.0 - 8.5 g/dL   Albumin 4.7 3.8 - 4.8 g/dL   Globulin, Total 1.7 1.5 - 4.5 g/dL   Albumin/Globulin Ratio 2.8 (H) 1.2 - 2.2   Bilirubin Total 0.7 0.0 - 1.2 mg/dL   Alkaline Phosphatase 67 39 - 117 IU/L   AST 19 0 - 40 IU/L   ALT 18  0 - 44 IU/L  Lipid Panel w/o Chol/HDL Ratio  Result Value Ref Range   Cholesterol, Total 130 100 - 199 mg/dL   Triglycerides 355 (H) 0 - 149 mg/dL   HDL 30 (L) >73 mg/dL   VLDL Cholesterol Cal 29 5 - 40 mg/dL   LDL Chol Calc (NIH) 71 0 - 99 mg/dL  Bayer DCA Hb U2G Waived  Result Value Ref Range   HB A1C (BAYER DCA - WAIVED) 9.1 (H) <7.0 %      Assessment & Plan:   Problem List Items Addressed This Visit    None    Visit Diagnoses    Upper respiratory tract infection, unspecified type    -  Primary   Will treat with prednisone and tussionex. Call if not getting better or getting worse. Continue to monitor.        Follow up plan: Return if symptoms worsen or fail to improve.    . This visit was completed via telephone due to the restrictions of the COVID-19 pandemic. All issues as above were discussed and addressed but no physical exam was performed. If it was felt that the patient should be evaluated in the office, they were directed there. The patient verbally consented to this visit. Patient was unable to complete an audio/visual visit due to Lack of equipment. Due to the catastrophic nature of the COVID-19 pandemic, this visit was done through audio contact only. . Location of the patient: home . Location of the provider: home . Those involved with this call:  . Provider: Olevia Perches, DO . CMA: Tiffany Reel, CMA . Front Desk/Registration: Adela Ports  . Time spent on call: 15 minutes on the phone discussing health concerns. 23 minutes total spent in review of patient's record and preparation of their chart.

## 2020-04-09 ENCOUNTER — Other Ambulatory Visit: Payer: Self-pay | Admitting: Nurse Practitioner

## 2020-04-09 ENCOUNTER — Telehealth: Payer: Self-pay | Admitting: Family Medicine

## 2020-04-09 NOTE — Telephone Encounter (Signed)
Routing to provider to advise patient.

## 2020-04-09 NOTE — Telephone Encounter (Signed)
Copied from CRM [id number]. Topic: General - Inquiry >> Apr 09, 2020  1:37 PM Reggie Pile, NT wrote: Reason for CRM: Patient called in stating he took his last prednisone Sunday. States he is still having congestions and coughing, inquiring what to do next. Please advise.

## 2020-04-10 MED ORDER — AZITHROMYCIN 250 MG PO TABS: ORAL_TABLET | ORAL | 0 refills | Status: DC

## 2020-04-10 NOTE — Telephone Encounter (Signed)
Patient notified

## 2020-04-10 NOTE — Telephone Encounter (Signed)
Azithromycin sent to his pharmacy

## 2020-04-16 ENCOUNTER — Other Ambulatory Visit: Payer: Self-pay | Admitting: Family Medicine

## 2020-04-16 ENCOUNTER — Other Ambulatory Visit: Payer: Self-pay | Admitting: Cardiovascular Disease

## 2020-04-16 DIAGNOSIS — E782 Mixed hyperlipidemia: Secondary | ICD-10-CM

## 2020-05-06 ENCOUNTER — Encounter: Payer: Self-pay | Admitting: Family Medicine

## 2020-05-06 ENCOUNTER — Ambulatory Visit (INDEPENDENT_AMBULATORY_CARE_PROVIDER_SITE_OTHER): Payer: Medicare HMO | Admitting: Family Medicine

## 2020-05-06 ENCOUNTER — Other Ambulatory Visit: Payer: Self-pay

## 2020-05-06 VITALS — BP 105/68 | HR 73 | Temp 98.5°F | Wt 290.2 lb

## 2020-05-06 DIAGNOSIS — R600 Localized edema: Secondary | ICD-10-CM | POA: Diagnosis not present

## 2020-05-06 DIAGNOSIS — L03116 Cellulitis of left lower limb: Secondary | ICD-10-CM

## 2020-05-06 DIAGNOSIS — Z86718 Personal history of other venous thrombosis and embolism: Secondary | ICD-10-CM

## 2020-05-06 MED ORDER — SULFAMETHOXAZOLE-TRIMETHOPRIM 800-160 MG PO TABS: 1.0000 | ORAL_TABLET | Freq: Two times a day (BID) | ORAL | 0 refills | Status: DC

## 2020-05-06 NOTE — Progress Notes (Signed)
BP 105/68 (BP Location: Left Arm, Patient Position: Sitting, Cuff Size: Normal)   Pulse 73   Temp 98.5 F (36.9 C) (Oral)   Wt 290 lb 3.2 oz (131.6 kg)   SpO2 97%   BMI 42.02 kg/m    Subjective:    Patient ID: Jeff Mealing., male    DOB: 1951-11-05, 69 y.o.   MRN: [id number]  HPI: Jeff Groesbeck. is a 69 y.o. male  Chief Complaint  Patient presents with  . Recurrent Skin Infections    lower left leg   SKIN INFECTION Duration: about a week Location: LLE History of trauma in area: no Pain: yes Quality: aching and tight Severity: moderateH3 Redness: yes Swelling: yes Oozing: yes Pus: no Fevers: no Nausea/vomiting: no Status: worse Treatments attempted:warm compresses  Tetanus: UTD  Relevant past medical, surgical, family and social history reviewed and updated as indicated. Interim medical history since our last visit reviewed. Allergies and medications reviewed and updated.  Review of Systems  Constitutional: Negative.   Respiratory: Negative.   Cardiovascular: Positive for leg swelling. Negative for chest pain and palpitations.  Gastrointestinal: Negative.   Musculoskeletal: Negative.   Skin: Positive for color change. Negative for pallor, rash and wound.  Psychiatric/Behavioral: Negative.     Per HPI unless specifically indicated above     Objective:    BP 105/68 (BP Location: Left Arm, Patient Position: Sitting, Cuff Size: Normal)   Pulse 73   Temp 98.5 F (36.9 C) (Oral)   Wt 290 lb 3.2 oz (131.6 kg)   SpO2 97%   BMI 42.02 kg/m   Wt Readings from Last 3 Encounters:  05/06/20 290 lb 3.2 oz (131.6 kg)  03/13/20 269 lb 9.6 oz (122.3 kg)  12/11/19 292 lb 4 oz (132.6 kg)    Physical Exam Vitals and nursing note reviewed.  Constitutional:      General: He is not in acute distress.    Appearance: Normal appearance. He is not ill-appearing, toxic-appearing or diaphoretic.  HENT:     Head: Normocephalic and atraumatic.     Right Ear:  External ear normal.     Left Ear: External ear normal.     Nose: Nose normal.     Mouth/Throat:     Mouth: Mucous membranes are moist.     Pharynx: Oropharynx is clear.  Eyes:     General: No scleral icterus.       Right eye: No discharge.        Left eye: No discharge.     Extraocular Movements: Extraocular movements intact.     Conjunctiva/sclera: Conjunctivae normal.     Pupils: Pupils are equal, round, and reactive to light.  Cardiovascular:     Rate and Rhythm: Normal rate and regular rhythm.     Pulses: Normal pulses.     Heart sounds: Normal heart sounds. No murmur heard.  No friction rub. No gallop.   Pulmonary:     Effort: Pulmonary effort is normal. No respiratory distress.     Breath sounds: Normal breath sounds. No stridor. No wheezing, rhonchi or rales.  Chest:     Chest wall: No tenderness.  Musculoskeletal:        General: Normal range of motion.     Cervical back: Normal range of motion and neck supple.     Right lower leg: Edema present.     Left lower leg: Edema (2+ edema with weeping, redness and heat) present.  Skin:  General: Skin is warm and dry.     Capillary Refill: Capillary refill takes less than 2 seconds.     Coloration: Skin is not jaundiced or pale.     Findings: No bruising, erythema, lesion or rash.  Neurological:     General: No focal deficit present.     Mental Status: He is alert and oriented to person, place, and time. Mental status is at baseline.  Psychiatric:        Mood and Affect: Mood normal.        Behavior: Behavior normal.        Thought Content: Thought content normal.        Judgment: Judgment normal.     Results for orders placed or performed in visit on 03/13/20  CBC with Differential/Platelet  Result Value Ref Range   WBC 6.2 3.4 - 10.8 x10E3/uL   RBC 5.12 4.14 - 5.80 x10E6/uL   Hemoglobin 14.8 13.0 - 17.7 g/dL   Hematocrit 42.5 95.6 - 51.0 %   MCV 87 79 - 97 fL   MCH 28.9 26.6 - 33.0 pg   MCHC 33.1 31 - 35 g/dL    RDW 38.7 56.4 - 33.2 %   Platelets 190 150 - 450 x10E3/uL   Neutrophils 72 Not Estab. %   Lymphs 15 Not Estab. %   Monocytes 10 Not Estab. %   Eos 1 Not Estab. %   Basos 1 Not Estab. %   Neutrophils Absolute 4.5 1 - 7 x10E3/uL   Lymphocytes Absolute 0.9 0 - 3 x10E3/uL   Monocytes Absolute 0.7 0 - 0 x10E3/uL   EOS (ABSOLUTE) 0.1 0.0 - 0.4 x10E3/uL   Basophils Absolute 0.0 0 - 0 x10E3/uL   Immature Granulocytes 1 Not Estab. %   Immature Grans (Abs) 0.1 0.0 - 0.1 x10E3/uL  Comprehensive metabolic panel  Result Value Ref Range   Glucose 213 (H) 65 - 99 mg/dL   BUN 26 8 - 27 mg/dL   Creatinine, Ser 9.51 (H) 0.76 - 1.27 mg/dL   GFR calc non Af Amer 55 (L) >59 mL/min/1.73   GFR calc Af Amer 63 >59 mL/min/1.73   BUN/Creatinine Ratio 20 10 - 24   Sodium 140 134 - 144 mmol/L   Potassium 4.3 3.5 - 5.2 mmol/L   Chloride 100 96 - 106 mmol/L   CO2 22 20 - 29 mmol/L   Calcium 8.8 8.6 - 10.2 mg/dL   Total Protein 6.4 6.0 - 8.5 g/dL   Albumin 4.7 3.8 - 4.8 g/dL   Globulin, Total 1.7 1.5 - 4.5 g/dL   Albumin/Globulin Ratio 2.8 (H) 1.2 - 2.2   Bilirubin Total 0.7 0.0 - 1.2 mg/dL   Alkaline Phosphatase 67 39 - 117 IU/L   AST 19 0 - 40 IU/L   ALT 18 0 - 44 IU/L  Lipid Panel w/o Chol/HDL Ratio  Result Value Ref Range   Cholesterol, Total 130 100 - 199 mg/dL   Triglycerides 884 (H) 0 - 149 mg/dL   HDL 30 (L) >16 mg/dL   VLDL Cholesterol Cal 29 5 - 40 mg/dL   LDL Chol Calc (NIH) 71 0 - 99 mg/dL  Bayer DCA Hb S0Y Waived  Result Value Ref Range   HB A1C (BAYER DCA - WAIVED) 9.1 (H) <7.0 %      Assessment & Plan:   Problem List Items Addressed This Visit      Other   EDEMA    Will get him into see vascular.  Referral generated today. Call with any concerns.       Relevant Orders   Ambulatory referral to Vascular Surgery   History of DVT (deep vein thrombosis)   Relevant Orders   Ambulatory referral to Vascular Surgery    Other Visit Diagnoses    Cellulitis of left lower  extremity    -  Primary   Will treat with bactrim. Given history of edema will get him into see vascular. Call with any concerns.        Follow up plan: Return if symptoms worsen or fail to improve.

## 2020-05-06 NOTE — Assessment & Plan Note (Signed)
Will get him into see vascular. Referral generated today. Call with any concerns.

## 2020-05-19 NOTE — Progress Notes (Signed)
Evaluation Performed:  Follow-up visit  Date:  05/20/2020   ID:  Jeff Mealing., DOB 06/07/1951, MRN [id number]  Patient Location:  [address] [place] [address]   Provider location:   [facility], [place] office  PCP:  Dorcas Carrow, DO  Cardiologist:  Fonnie Mu  Chief Complaint  Patient presents with   office visit    Pt has no complaints. Meds verbally reviewed w/ pt.    History of Present Illness:    Jeff Shatto. is a 69 y.o. male  past medical history of DM2  hemoglobin A1c 7.7 DVT LLE 2010, with filter hyperlipidemia  April 2011  atrial fibrillation with RVR,  emergency room on June 8 2011for rapid atrial fibrillation,  s/p cardioversion  lower extremity edema On calcium channel blockers,  Echo in 02/2010 shows EF 40-45%,  possibly tachycardia induced  Converted back to atrial fibrillation in 2016, at that time did not want cardioversion (CHF, HTN, age x 1, DM, vascular disease): IVC stenosis, [facility]  he presents for routine followup for chronic atrial fibrillation, and extensive DVT  Busy , mowing Runs a kennel, 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, lots of breeds 6 kennels, climate controlled, 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  Had weeping in leg, did course of ABX On amlodipine 10 daily On lasix 20 daily, followed by [facility]  Low pressures on office visits, 105 to 108 systolic Sluggish in the Am Better in the afternoon  HBA1C 9.1, poor diet,  Up from 7  Continues to take aspirin 325 mg daily under the direction of [facility] vascular and Xarelto 20 daily  Got over covid, 08/2019 Treated with steroids Was shipped to [facility], enrolled in study Details below, thinks the prednisone may have pushed her sugars up, A1c of 9 this past year  No regular exercise program Discussed lifestyle Weight trending up 10 pounds over the past 2 years on record review  EKG personally reviewed by myself on todays visit Shows atrial fib rate 70 bpm  Other past  medical history reviewed [facility] on 08/11/19 and diagnosed with COVID 19. He was started on prednisone which elevated his blood sugar.  He was not admitted. He went to [facility] ER on Tuesday 10/6 at 6 am and sat in the ER in a cubicle until 10 pm that night, even being COVID +.  stayed there 6 days and was started on remdesivir and antibodies trial diagnosed with pneumonia as well.  discharged/ quarantined for 20 days.   Chest pain in ER 12/2018 Hospital records reviewed with the patient in detail SOB, and coughing Took Mucinex and Zyrtec.  He thinks maybe it is a reaction to both of the medicines  chest tightness and warmth associated with some clammy feeling. Had pressure 185/93 Pulse 127  08/2018  Sepsis secondary to cellulitis of the left lower extremity and streptococcus bacteremia: Hospital records reviewed with the patient in detail  History of DVT left lower extremity  thrombus was occlusive left SFA down to popliteal   IVC filter in place from 2010  As outpatient seen by Dr. Lorretta Harp, "too much clot" Recommended lymphedema compression pumps  Referred to [facility] for thrombectomy Reports that he had thrombectomy procedure , took hours 5 pm to 10 pm No anesthesia, very painful  He has had follow-up lower extremity CT scan at [facility] Confirming residual B/l extensive clot, iliacs extending downward    Prior CV studies:   The following studies were  reviewed today:    Past Medical History:  Diagnosis Date   Chronic deep vein thrombosis (DVT) (HCC)    Chronic low back pain    Chronic systolic heart failure (HCC)    a. TTE 2011 with EF 40-45% per notes   Diabetes mellitus without complication [facility])    Erectile dysfunction    HLD (hyperlipidemia)    Hypertension    Hypogonadism in male    Permanent atrial fibrillation (HCC)    a. s/p DCCV 2011; b. redeveloped Afib in 2016; c. CHADS2VASc at least 5 (CHF, HTN, age x 1, DM, vascular disease); d. on Xarelto   Torn  meniscus    right knee   Past Surgical History:  Procedure Laterality Date   COLONOSCOPY WITH PROPOFOL N/A 05/28/2016   Procedure: COLONOSCOPY WITH PROPOFOL;  Surgeon: Midge Minium, MD;  Location: [facility];  Service: Endoscopy;  Laterality: N/A;  diabetic - oral meds   DVT, leg Left    INSERTION OF VENA CAVA FILTER  2014   KNEE ARTHROSCOPY WITH MEDIAL MENISECTOMY Right 05/31/2019   Procedure: KNEE ARTHROSCOPY WITH partial  MEDIAL MENISECTOMY;  Surgeon: Signa Kell, MD;  Location: [facility];  Service: Orthopedics;  Laterality: Right;   PERIPHERAL VASCULAR THROMBECTOMY Left 12/01/2017   Procedure: PERIPHERAL VASCULAR THROMBECTOMY;  Surgeon: Renford Dills, MD;  Location: [facility] INVASIVE CV LAB;  Service: Cardiovascular;  Laterality: Left;     Current Meds  Medication Sig   acetaminophen (TYLENOL) 500 MG tablet Take 2 tablets (1,000 mg total) by mouth every 8 (eight) hours.   albuterol (VENTOLIN HFA) 108 (90 Base) MCG/ACT inhaler Inhale 2 puffs into the lungs every 4 (four) hours as needed for wheezing or shortness of breath.   amLODipine (NORVASC) 10 MG tablet Take 10 mg by mouth daily.   aspirin 325 MG tablet Take 325 mg by mouth daily.   atorvastatin (LIPITOR) 20 MG tablet TAKE 1/2 TABLET BY MOUTH DAILY   azithromycin (ZITHROMAX) 250 MG tablet 2 tabs today, then 1 tab daily for 4 days   benazepril (LOTENSIN) 40 MG tablet Take 1 tablet (40 mg total) by mouth daily.   chlorpheniramine-HYDROcodone (TUSSIONEX PENNKINETIC ER) 10-8 MG/5ML SUER Take 5 mLs by mouth every 12 (twelve) hours as needed.   doxazosin (CARDURA) 2 MG tablet Take 1 tablet (2 mg total) by mouth 2 (two) times daily.   fluticasone (FLONASE) 50 MCG/ACT nasal spray Place into both nostrils daily as needed for allergies or rhinitis.   furosemide (LASIX) 20 MG tablet Take 1 tablet (20 mg total) by mouth daily.   metFORMIN (GLUCOPHAGE) 1000 MG tablet TAKE 1 TABLET (1,000 MG TOTAL) BY MOUTH 2  (TWO) TIMES DAILY WITH A MEAL.   metoprolol tartrate (LOPRESSOR) 100 MG tablet TAKE 1 TABLET BY MOUTH TWICE A DAY   montelukast (SINGULAIR) 10 MG tablet Take 10 mg by mouth daily.    rivaroxaban (XARELTO) 20 MG TABS tablet Take 1 tablet (20 mg total) by mouth daily.     Allergies:   Patient has no known allergies.   Social History   Tobacco Use   Smoking status: Never Smoker   Smokeless tobacco: Never Used  Vaping Use   Vaping Use: Never used  Substance Use Topics   Alcohol use: No    Alcohol/week: 0.0 standard drinks   Drug use: No     Family Hx: The patient's family history includes Diabetes in his father; Diabetes Mellitus II in an other family member; Heart failure in  his father; Lymphoma in his mother. There is no history of Heart attack, Hypertension, Cancer, COPD, Stroke, Prostate cancer, Kidney cancer, or Bladder Cancer.  ROS:   Please see the history of present illness.    Review of Systems  Constitutional: Negative.   Respiratory: Negative.   Cardiovascular: Negative.   Gastrointestinal: Negative.   Musculoskeletal: Negative.   Neurological: Negative.   Psychiatric/Behavioral: Negative.   All other systems reviewed and are negative.    Labs/Other Tests and Data Reviewed:    Recent Labs: 08/11/2019: B Natriuretic Peptide 209.0 12/11/2019: TSH 3.750 03/13/2020: ALT 18; BUN 26; Creatinine, Ser 1.32; Hemoglobin 14.8; Platelets 190; Potassium 4.3; Sodium 140   Recent Lipid Panel Lab Results  Component Value Date/Time   CHOL 130 03/13/2020 03:10 PM   CHOL 130 09/07/2018 08:22 AM   TRIG 166 (H) 03/13/2020 03:10 PM   TRIG 95 09/07/2018 08:22 AM   TRIG 111 02/17/2010 12:00 AM   HDL 30 (L) 03/13/2020 03:10 PM   CHOLHDL 4.0 10/06/2019 12:53 PM   LDLCALC 71 03/13/2020 03:10 PM    Wt Readings from Last 3 Encounters:  05/20/20 288 lb 8 oz (130.9 kg)  05/06/20 290 lb 3.2 oz (131.6 kg)  03/13/20 269 lb 9.6 oz (122.3 kg)     Exam:    Vital Signs: Vital  signs may also be detailed in the HPI BP 106/70 (BP Location: Left Arm, Patient Position: Sitting, Cuff Size: Large)    Pulse 70    Ht 5\' 11"  (1.803 m)    Wt 288 lb 8 oz (130.9 kg)    SpO2 98%    BMI 40.24 kg/m   Constitutional:  oriented to person, place, and time. No distress.  HENT:  Head: Grossly normal Eyes:  no discharge. No scleral icterus.  Neck: No JVD, no carotid bruits  Cardiovascular: Regular rate and rhythm, no murmurs appreciated Pulmonary/Chest: Clear to auscultation bilaterally, no wheezes or rails Abdominal: Soft.  no distension.  no tenderness.  Musculoskeletal: Normal range of motion Neurological:  normal muscle tone. Coordination normal. No atrophy Skin: Skin warm and dry Psychiatric: normal affect, pleasant   ASSESSMENT & PLAN:    Atrial fibrillation, unspecified type (HCC Continue current medications, Xarelto 20 daily Question of the aspirin recommended by [facility] vascular surgery, 2 years later after DVT treatment Rate well controlled  Congestive dilated cardiomyopathy (HCC) Continue Lasix 20 daily for now, creatinine stable, followed by nephrology, notes reviewed labs reviewed Cut back on amlodipine 10 mg down to 5 mg given low blood pressure and leg swelling history  Type 2 diabetes mellitus with other circulatory complication, without long-term current use of insulin (HCC) Lifestyle modification recommended, strict diet, A1c more than 9 Possibly exacerbated by prednisone treatment for Covid October 2020 Working with primary care   HYPERTENSION, BENIGN Review of previous notes, blood pressure running low, hold amlodipine 10 go down to 5 mg Monitor pressures at home  Mixed hyperlipidemia Cholesterol at goal  Acute renal failure, unspecified acute renal failure type (HCC) Long history of diabetes,  Stable renal function creatinine 1.2 up to 1.3  History of DVT Still on high-dose aspirin he feels at the request of vascular surgery in 2019, has not had  follow-up since that time still on aspirin 325 with Xarelto 20 Discussed his high risk of GI bleed Recommend he call vascular surgery, request to come off aspirin and stay on Xarelto 20 daily for his atrial fibrillation  Disposition: Follow-up in 6 months   Signed, Marcial Pacasimothy  Mariah Milling, MD  05/20/2020 9:24 AM    [facility] [address], [place], [place] [address]

## 2020-05-20 ENCOUNTER — Other Ambulatory Visit: Payer: Self-pay

## 2020-05-20 ENCOUNTER — Ambulatory Visit: Payer: Medicare HMO | Admitting: Cardiovascular Disease

## 2020-05-20 ENCOUNTER — Encounter: Payer: Self-pay | Admitting: Cardiovascular Disease

## 2020-05-20 VITALS — BP 106/70 | HR 70 | Ht 71.0 in | Wt 288.5 lb

## 2020-05-20 DIAGNOSIS — E1159 Type 2 diabetes mellitus with other circulatory complications: Secondary | ICD-10-CM | POA: Diagnosis not present

## 2020-05-20 DIAGNOSIS — I4891 Unspecified atrial fibrillation: Secondary | ICD-10-CM | POA: Diagnosis not present

## 2020-05-20 DIAGNOSIS — I420 Dilated cardiomyopathy: Secondary | ICD-10-CM

## 2020-05-20 DIAGNOSIS — E782 Mixed hyperlipidemia: Secondary | ICD-10-CM | POA: Diagnosis not present

## 2020-05-20 DIAGNOSIS — I10 Essential (primary) hypertension: Secondary | ICD-10-CM | POA: Diagnosis not present

## 2020-05-20 DIAGNOSIS — I82403 Acute embolism and thrombosis of unspecified deep veins of lower extremity, bilateral: Secondary | ICD-10-CM | POA: Diagnosis not present

## 2020-05-20 MED ORDER — AMLODIPINE BESYLATE 5 MG PO TABS: 5.0000 mg | ORAL_TABLET | Freq: Every day | ORAL | 3 refills | Status: DC

## 2020-05-20 NOTE — Patient Instructions (Addendum)
Medication Instructions:  Please cut the amlodipine in 1/2 daily (5 mg daily)  If you need a refill on your cardiac medications before your next appointment, please call your pharmacy.    Lab work: No new labs needed   If you have labs (blood work) drawn today and your tests are completely normal, you will receive your results only by: Marland Kitchen MyChart Message (if you have MyChart) OR . A paper copy in the mail If you have any lab test that is abnormal or we need to change your treatment, we will call you to review the results.   Testing/Procedures: No new testing needed   Follow-Up: At [facility], you and your health needs are our priority.  As part of our continuing mission to provide you with exceptional heart care, we have created designated Provider Care Teams.  These Care Teams include your primary Cardiologist (physician) and Advanced Practice Providers (APPs -  Physician Assistants and Nurse Practitioners) who all work together to provide you with the care you need, when you need it.  . You will need a follow up appointment in 6 months   . Providers on your designated Care Team:   . Nicolasa Ducking, NP . Eula Listen, PA-C . Marisue Ivan, PA-C  Any Other Special Instructions Will Be Listed Below (If Applicable).  COVID-19 Vaccine Information can be found at: [email or web address] For questions related to vaccine distribution or appointments, please email [email or web address] or call [phone].

## 2020-06-04 ENCOUNTER — Telehealth: Payer: Self-pay | Admitting: Family Medicine

## 2020-06-04 NOTE — Telephone Encounter (Signed)
Routing to provider to confirm. Patient is to only take 1 1000 mg tablet BID correct? Max dose of metformin per day is 2000 mg correct?

## 2020-06-04 NOTE — Telephone Encounter (Signed)
Pt last seen dr Laural Benes on 05-06-2020. Pt has been taking metformin 1000 mg 2 in morning and 2 at night total of 4 pills. Pt said dr Dossie Arbour put him on 4 pills a day and rx was sent for 2 pill. Pt needs [facility] rx [facility] [address]

## 2020-06-04 NOTE — Telephone Encounter (Signed)
Yes. He should be taking 1000mg  in the AM and 1000mg  in the PM. 4000mg  a day is too much

## 2020-06-04 NOTE — Telephone Encounter (Signed)
Called and discussed medication with the patient. He states that Dr. Dossie Arbour out him on 4 tablets daily a long time ago. I explained to the patient that Dr. Dossie Arbour had been writing 1000 mg 1 tab BID since October 2016 but patient has been taking 4 tablets daily. He states he is out of his medication. Asked him if he had already picked up all of the prescription we sent in in May and he said he has not but cannot get the refill until next month.   [facility] They state that he last filled the prescription for #90 on 04/13/20 so the next fill is not due until 07/14/20. Patient will be out of medication until then because insurance will not pay for them until due. Could we possibly change to 500 mg 2 tabs BID to bridge patient until his next RX is due for the 1000 mg BID?

## 2020-06-05 MED ORDER — METFORMIN HCL ER 500 MG PO TB24: 1000.0000 mg | ORAL_TABLET | Freq: Two times a day (BID) | ORAL | 1 refills | Status: DC | PRN

## 2020-06-05 NOTE — Addendum Note (Signed)
Addended by: Dorcas Carrow on: 06/05/2020 12:56 PM   Modules accepted: Orders

## 2020-06-05 NOTE — Telephone Encounter (Signed)
Copied from CRM [id number]. Topic: Medicare AWV >> Jun 05, 2020 10:09 AM Claudette Laws R wrote: Reason for CRM:   Left message for patient to call back and schedule the Medicare Annual Wellness Visit (AWV) virtually.  Last AWV 05/10/2019  Please schedule at anytime with [facility]-Nurse Health Advisor.  45 minute appointment

## 2020-06-07 ENCOUNTER — Other Ambulatory Visit: Payer: Self-pay | Admitting: Cardiovascular Disease

## 2020-06-09 NOTE — Telephone Encounter (Signed)
Refill Request.  

## 2020-06-09 NOTE — Telephone Encounter (Signed)
Pt's age 69, wt 130.9 kg, SCr 1.32, CrCl 97.79, last ov w/ TG 05/20/20. Xarelto 20 mg refill sent in as requested.

## 2020-06-17 ENCOUNTER — Ambulatory Visit: Payer: Medicare HMO | Admitting: Family Medicine

## 2020-07-08 ENCOUNTER — Other Ambulatory Visit: Payer: Self-pay | Admitting: Cardiovascular Disease

## 2020-09-19 ENCOUNTER — Other Ambulatory Visit: Payer: Self-pay | Admitting: Family Medicine

## 2020-09-19 DIAGNOSIS — E782 Mixed hyperlipidemia: Secondary | ICD-10-CM

## 2020-09-30 ENCOUNTER — Telehealth: Payer: Self-pay

## 2020-09-30 NOTE — Telephone Encounter (Signed)
Copied from CRM [id number]. Topic: General - Other >> Sep 26, 2020  2:42 PM Gwenlyn Fudge wrote: Reason for CRM: Netty Starring, from [place],  called stating that they are trying to reach pt to discuss A1C levels. Please advise.  [phone]

## 2020-10-01 NOTE — Telephone Encounter (Signed)
Called and LVM asking for Jeff [name] asking for her to please return my call if she needed to discuss something with [place] regarding this patient.

## 2020-10-06 ENCOUNTER — Other Ambulatory Visit: Payer: Self-pay | Admitting: Cardiovascular Disease

## 2020-10-06 NOTE — Telephone Encounter (Signed)
Rx request sent to pharmacy.  

## 2020-10-14 ENCOUNTER — Telehealth: Payer: Self-pay | Admitting: Family Medicine

## 2020-10-14 NOTE — Telephone Encounter (Signed)
Copied from CRM [id number]. Topic: Medicare AWV >> Oct 14, 2020  2:50 PM Claudette Laws R wrote: Reason for CRM:   Left message for patient to call back and schedule the Medicare Annual Wellness Visit (AWV) virtually.  Last AWV 05/10/2019  Please schedule at anytime with [facility]-Nurse Health Advisor.  45 minute appointment  Any questions, please call me at [phone]

## 2020-10-15 ENCOUNTER — Other Ambulatory Visit: Payer: Self-pay | Admitting: Family Medicine

## 2020-10-22 ENCOUNTER — Telehealth: Payer: Self-pay | Admitting: Family Medicine

## 2020-10-22 NOTE — Telephone Encounter (Signed)
[facility] requesting patient call back to schedule AWV that needs to be performed before 11/07/20.

## 2020-10-23 NOTE — Telephone Encounter (Signed)
Patient returned call and would like for office to return call about setting up his AWV. He can be reached at [phone]. Please advise

## 2020-10-23 NOTE — Telephone Encounter (Signed)
I offered patient 10-27-20 at 1:45  But he will be out of town until the week after Christmas. He can come that week on a Monday or Tuesday. However there wasn't anything available. He also would like you to call him on his cell phone and the number is [phone].

## 2020-10-23 NOTE — Telephone Encounter (Signed)
Is this something that Dr Laural Benes can possibly do in a 20 min slot due to Jeff [name] not having availability. Please advise.

## 2020-10-24 NOTE — Telephone Encounter (Signed)
Patient is calling to check on the status of appt Please advise Cb- [phone]

## 2020-11-11 ENCOUNTER — Other Ambulatory Visit: Payer: Self-pay

## 2020-11-11 ENCOUNTER — Encounter: Payer: Self-pay | Admitting: Family Medicine

## 2020-11-11 ENCOUNTER — Telehealth (INDEPENDENT_AMBULATORY_CARE_PROVIDER_SITE_OTHER): Payer: Medicare HMO | Admitting: Family Medicine

## 2020-11-11 VITALS — BP 145/88 | HR 91 | Temp 99.9°F

## 2020-11-11 DIAGNOSIS — J069 Acute upper respiratory infection, unspecified: Secondary | ICD-10-CM | POA: Diagnosis not present

## 2020-11-11 MED ORDER — HYDROCOD POLST-CPM POLST ER 10-8 MG/5ML PO SUER
5.0000 mL | Freq: Two times a day (BID) | ORAL | 0 refills | Status: DC | PRN
Start: 2020-11-11 — End: 2021-05-26

## 2020-11-11 NOTE — Progress Notes (Signed)
Virtual Visit via Video Note  I connected with Jeff [name]. on 11/11/20 at 10:40 AM EST by a video enabled telemedicine application and verified that I am speaking with the correct person using two identifiers.  Location: Patient: home Provider: [facility]   I discussed the limitations of evaluation and management by telemedicine and the availability of in person appointments. The patient expressed understanding and agreed to proceed.  History of Present Illness:  UPPER RESPIRATORY TRACT INFECTION - had COVID last year - 2 doses of Moderna, due for booster - sx started Saturday Worst symptom: headache, runny nose, sore throat, chills Fever: no Cough: yes, nonproductive Shortness of breath: no Chest pain: yes, with cough Chest congestion: yes Nasal congestion: yes Runny nose: yes Sore throat: yes Headache: yes Ear pain: no  Ear pressure: no  Vomiting: no Sick contacts: no Context: worse Recurrent sinusitis: yes Relief with OTC cold/cough medications: yes  Treatments attempted: cough syrup, flonase     Observations/Objective:  Patient had trouble connecting to video visit, entirety of visit conducted over the phone.  Speaks in full sentences, no respiratory distress.  Assessment and Plan:  Viral URI Symptoms and exam most consistent with viral URI. Will test for COVID, would be candidate for MAB tx given high risk co-morbidities. Supportive care including OTC symptom relief, pain relief/fever reducer, maintaining adequate oral hydration. Return and emergency precautions reviewed. Self-quarantine guidelines reviewed.     I discussed the assessment and treatment plan with the patient. The patient was provided an opportunity to ask questions and all were answered. The patient agreed with the plan and demonstrated an understanding of the instructions.   The patient was advised to call back or seek an in-person evaluation if the symptoms worsen or if the condition fails to  improve as anticipated.  I provided 11 minutes of non-face-to-face time during this encounter.   Caro Laroche, DO

## 2020-11-11 NOTE — Assessment & Plan Note (Signed)
Symptoms and exam most consistent with viral URI. Will test for COVID, would be candidate for MAB tx given high risk co-morbidities. Supportive care including OTC symptom relief, pain relief/fever reducer, maintaining adequate oral hydration. Return and emergency precautions reviewed. Self-quarantine guidelines reviewed.

## 2020-11-11 NOTE — Patient Instructions (Signed)
It was great to see you!  Our plans for today:  - See below for self-isolation guidelines. You may end your quarantine once you are 10 days from symptom onset and fever free for 24 hours without use of tylenol or ibuprofen or if your COVID test is negative. - If you test positive, someone will be contacting you about antibody infusion treatment. There is a Sport and exercise psychologist of antibody infusion treatments and oral antivirals as well as a backlog of patients on the referral list, the infusion team may not be able to get in touch with you right away due to this. - I recommend getting your booster once you are healed from your current infection and 6 months out from your 2nd dose. If you get the antibody infusion, you must wait 3 months before vaccination.  - Try mucinex or over the counter cold/sinus medication for your symptoms.  - Certainly, if you are having difficulties breathing or unable to keep down fluids, go to the Emergency Department.   Take care and seek immediate care sooner if you develop any concerns.   Dr. Linwood Dibbles     Person Under Monitoring Name: Jeff [name].  Location: [address] [place] [address]   Infection Prevention Recommendations for Individuals Confirmed to have, or Being Evaluated for, 2019 Novel Coronavirus (COVID-19) Infection Who Receive Care at Home  Individuals who are confirmed to have, or are being evaluated for, COVID-19 should follow the prevention steps below until a healthcare provider or local or state health department says they can return to normal activities.  Stay home except to get medical care You should restrict activities outside your home, except for getting medical care. Do not go to work, school, or public areas, and do not use public transportation or taxis.  Call ahead before visiting your doctor Before your medical appointment, call the healthcare provider and tell them that you have, or are being evaluated  for, COVID-19 infection. This will help the healthcare provider's office take steps to keep other people from getting infected. Ask your healthcare provider to call the local or state health department.  Monitor your symptoms Seek prompt medical attention if your illness is worsening (e.g., difficulty breathing). Before going to your medical appointment, call the healthcare provider and tell them that you have, or are being evaluated for, COVID-19 infection. Ask your healthcare provider to call the local or state health department.  Wear a facemask You should wear a facemask that covers your nose and mouth when you are in the same room with other people and when you visit a healthcare provider. People who live with or visit you should also wear a facemask while they are in the same room with you.  Separate yourself from other people in your home As much as possible, you should stay in a different room from other people in your home. Also, you should use a separate bathroom, if available.  Avoid sharing household items You should not share dishes, drinking glasses, cups, eating utensils, towels, bedding, or other items with other people in your home. After using these items, you should wash them thoroughly with soap and water.  Cover your coughs and sneezes Cover your mouth and nose with a tissue when you cough or sneeze, or you can cough or sneeze into your sleeve. Throw used tissues in a lined trash can, and immediately wash your hands with soap and water for at least 20 seconds or use an alcohol-based hand  rub.  Wash your hands Wash your hands often and thoroughly with soap and water for at least 20 seconds. You can use an alcohol-based hand sanitizer if soap and water are not available and if your hands are not visibly dirty. Avoid touching your eyes, nose, and mouth with unwashed hands.   Prevention Steps for Caregivers and Household Members of Individuals Confirmed to have, or  Being Evaluated for, COVID-19 Infection Being Cared for in the Home  If you live with, or provide care at home for, a person confirmed to have, or being evaluated for, COVID-19 infection please follow these guidelines to prevent infection:  Follow healthcare provider's instructions Make sure that you understand and can help the patient follow any healthcare provider instructions for all care.  Provide for the patient's basic needs You should help the patient with basic needs in the home and provide support for getting groceries, prescriptions, and other personal needs.  Monitor the patient's symptoms If they are getting sicker, call his or her medical provider and tell them that the patient has, or is being evaluated for, COVID-19 infection. This will help the healthcare provider's office take steps to keep other people from getting infected. Ask the healthcare provider to call the local or state health department.  Limit the number of people who have contact with the patient  If possible, have only one caregiver for the patient.  Other household members should stay in another home or place of residence. If this is not possible, they should stay  in another room, or be separated from the patient as much as possible. Use a separate bathroom, if available.  Restrict visitors who do not have an essential need to be in the home.  Keep older adults, very young children, and other sick people away from the patient Keep older adults, very young children, and those who have compromised immune systems or chronic health conditions away from the patient. This includes people with chronic heart, lung, or kidney conditions, diabetes, and cancer.  Ensure good ventilation Make sure that shared spaces in the home have good air flow, such as from an air conditioner or an opened window, weather permitting.  Wash your hands often  Wash your hands often and thoroughly with soap and water for at least  20 seconds. You can use an alcohol based hand sanitizer if soap and water are not available and if your hands are not visibly dirty.  Avoid touching your eyes, nose, and mouth with unwashed hands.  Use disposable paper towels to dry your hands. If not available, use dedicated cloth towels and replace them when they become wet.  Wear a facemask and gloves  Wear a disposable facemask at all times in the room and gloves when you touch or have contact with the patient's blood, body fluids, and/or secretions or excretions, such as sweat, saliva, sputum, nasal mucus, vomit, urine, or feces.  Ensure the mask fits over your nose and mouth tightly, and do not touch it during use.  Throw out disposable facemasks and gloves after using them. Do not reuse.  Wash your hands immediately after removing your facemask and gloves.  If your personal clothing becomes contaminated, carefully remove clothing and launder. Wash your hands after handling contaminated clothing.  Place all used disposable facemasks, gloves, and other waste in a lined container before disposing them with other household waste.  Remove gloves and wash your hands immediately after handling these items.  Do not share dishes, glasses, or other  household items with the patient  Avoid sharing household items. You should not share dishes, drinking glasses, cups, eating utensils, towels, bedding, or other items with a patient who is confirmed to have, or being evaluated for, COVID-19 infection.  After the person uses these items, you should wash them thoroughly with soap and water.  Wash laundry thoroughly  Immediately remove and wash clothes or bedding that have blood, body fluids, and/or secretions or excretions, such as sweat, saliva, sputum, nasal mucus, vomit, urine, or feces, on them.  Wear gloves when handling laundry from the patient.  Read and follow directions on labels of laundry or clothing items and detergent. In general,  wash and dry with the warmest temperatures recommended on the label.  Clean all areas the individual has used often  Clean all touchable surfaces, such as counters, tabletops, doorknobs, bathroom fixtures, toilets, phones, keyboards, tablets, and bedside tables, every day. Also, clean any surfaces that may have blood, body fluids, and/or secretions or excretions on them.  Wear gloves when cleaning surfaces the patient has come in contact with.  Use a diluted bleach solution (e.g., dilute bleach with 1 part bleach and 10 parts water) or a household disinfectant with a label that says EPA-registered for coronaviruses. To make a bleach solution at home, add 1 tablespoon of bleach to 1 quart (4 cups) of water. For a larger supply, add  cup of bleach to 1 gallon (16 cups) of water.  Read labels of cleaning products and follow recommendations provided on product labels. Labels contain instructions for safe and effective use of the cleaning product including precautions you should take when applying the product, such as wearing gloves or eye protection and making sure you have good ventilation during use of the product.  Remove gloves and wash hands immediately after cleaning.  Monitor yourself for signs and symptoms of illness Caregivers and household members are considered close contacts, should monitor their health, and will be asked to limit movement outside of the home to the extent possible. Follow the monitoring steps for close contacts listed on the symptom monitoring form.   ? If you have additional questions, contact your local health department or call the epidemiologist on call at [phone] (available 24/7). ? This guidance is subject to change. For the most up-to-date guidance from [facility], please refer to their website: [email or web address]

## 2020-11-12 ENCOUNTER — Telehealth: Payer: Self-pay | Admitting: Family Medicine

## 2020-11-12 NOTE — Telephone Encounter (Signed)
Copied from CRM [id number]. Topic: General - Inquiry >> Nov 12, 2020  8:05 AM Adrian Prince D wrote: Reason for CRM: Patient took a home test and tested positive, wants Dr. Henriette Combs nurse to give him a call. He wants to know what he needs to do so he don't have to go to the hospital. He can be reached at [phone]. Please advise

## 2020-11-12 NOTE — Telephone Encounter (Signed)
Please advise 

## 2020-11-12 NOTE — Telephone Encounter (Signed)
I have referred him for monoclonal antibody infusion treatment. Someone will contact him if he qualifies for treatment over the next few days. There is a Sport and exercise psychologist of monoclonal antibody as well as oral antiviral treatments and a backlog of patients needing treatment so keep this in mind if there is a delay. He should stay quarantined, he may end his quarantine once he is 10 days from symptom onset and fever free for 24 hours without use of tylenol or ibuprofen. He can continue using over the counter cold/sinus medication since this has been helpful for him. Certainly, if he is having difficulties breathing or unable to keep down fluids, he should go to the Emergency Department.

## 2020-11-12 NOTE — Addendum Note (Signed)
Addended by: Caro Laroche on: 11/12/2020 09:22 AM   Modules accepted: Level of Service

## 2020-11-12 NOTE — Telephone Encounter (Signed)
Patient notified

## 2020-11-20 ENCOUNTER — Telehealth: Payer: Self-pay | Admitting: Cardiovascular Disease

## 2020-11-20 NOTE — Telephone Encounter (Signed)
Attempted to call pt. No answer. Lmtcb.  

## 2020-11-20 NOTE — Telephone Encounter (Signed)
    COVID-19 Pre-Screening Questions:  . In the past 7 to 10 days have you had a cough,  shortness of breath, headache, congestion, fever (100 or greater) body aches, chills, sore throat, or sudden loss of taste or sense of smell? YES . Have you been around anyone with known Covid 19. SELF . Have you been around anyone who is awaiting Covid 19 test results in the past 7 to 10 days? NO . Have you been around anyone who has been exposed to Covid 19, or has mentioned symptoms of Covid 19 within the past 7 to 10 days? SELF  If you have any concerns/questions about symptoms patients report during screening (either on the phone or at threshold). Contact the provider seeing the patient or DOD for further guidance.  If neither are available contact a member of the leadership team.

## 2020-11-20 NOTE — Telephone Encounter (Signed)
Patient calling to verify he can come to 1/18 visit.   Patient tested positive on 11/13/20. Patient tested negative retest on 11/18/20  Patient reports improved or absent symptoms .   Advised patient he could keep upcoming appt but would review with provider for confirmation and we will call if needing to change it .

## 2020-11-21 NOTE — Telephone Encounter (Signed)
Ok for patient to come to appointment if 5 days past positive Covid test and no symptoms. Patient states he was already aware and appreciative for the call.

## 2020-11-24 ENCOUNTER — Telehealth: Payer: Self-pay

## 2020-11-24 ENCOUNTER — Ambulatory Visit (INDEPENDENT_AMBULATORY_CARE_PROVIDER_SITE_OTHER): Payer: Medicare HMO

## 2020-11-24 VITALS — Ht 70.0 in | Wt 265.0 lb

## 2020-11-24 DIAGNOSIS — Z0000 Encounter for general adult medical examination without abnormal findings: Secondary | ICD-10-CM | POA: Diagnosis not present

## 2020-11-24 NOTE — Telephone Encounter (Signed)
Patient consented to telehealth visit. 

## 2020-11-24 NOTE — Patient Instructions (Signed)
Jeff [name] , Thank you for taking time to come for your Medicare Wellness Visit. I appreciate your ongoing commitment to your health goals. Please review the following plan we discussed and let me know if I can assist you in the future.   Screening recommendations/referrals: Colonoscopy: completed 05/28/2016, due 05/28/2026 Recommended yearly ophthalmology/optometry visit for glaucoma screening and checkup Recommended yearly dental visit for hygiene and checkup  Vaccinations: Influenza vaccine: due Pneumococcal vaccine: completed 02/23/2018 Tdap vaccine: completed 03/13/2020, due 03/13/2030 Shingles vaccine: discussed   Covid-19:  09/04/2020, 08/08/2020  Advanced directives: Please bring a copy of your POA (Power of Attorney) and/or Living Will to your next appointment.   Conditions/risks identified: none  Next appointment: Follow up in one year for your annual wellness visit.   Preventive Care 51 Years and Older, Male Preventive care refers to lifestyle choices and visits with your health care provider that can promote health and wellness. What does preventive care include?  A yearly physical exam. This is also called an annual well check.  Dental exams once or twice a year.  Routine eye exams. Ask your health care provider how often you should have your eyes checked.  Personal lifestyle choices, including:  Daily care of your teeth and gums.  Regular physical activity.  Eating a healthy diet.  Avoiding tobacco and drug use.  Limiting alcohol use.  Practicing safe sex.  Taking low doses of aspirin every day.  Taking vitamin and mineral supplements as recommended by your health care provider. What happens during an annual well check? The services and screenings done by your health care provider during your annual well check will depend on your age, overall health, lifestyle risk factors, and family history of disease. Counseling  Your health care provider may ask you  questions about your:  Alcohol use.  Tobacco use.  Drug use.  Emotional well-being.  Home and relationship well-being.  Sexual activity.  Eating habits.  History of falls.  Memory and ability to understand (cognition).  Work and work Astronomer. Screening  You may have the following tests or measurements:  Height, weight, and BMI.  Blood pressure.  Lipid and cholesterol levels. These may be checked every 5 years, or more frequently if you are over 22 years old.  Skin check.  Lung cancer screening. You may have this screening every year starting at age 75 if you have a 30-pack-year history of smoking and currently smoke or have quit within the past 15 years.  Fecal occult blood test (FOBT) of the stool. You may have this test every year starting at age 48.  Flexible sigmoidoscopy or colonoscopy. You may have a sigmoidoscopy every 5 years or a colonoscopy every 10 years starting at age 57.  Prostate cancer screening. Recommendations will vary depending on your family history and other risks.  Hepatitis C blood test.  Hepatitis B blood test.  Sexually transmitted disease (STD) testing.  Diabetes screening. This is done by checking your blood sugar (glucose) after you have not eaten for a while (fasting). You may have this done every 1-3 years.  Abdominal aortic aneurysm (AAA) screening. You may need this if you are a current or former smoker.  Osteoporosis. You may be screened starting at age 74 if you are at high risk. Talk with your health care provider about your test results, treatment options, and if necessary, the need for more tests. Vaccines  Your health care provider may recommend certain vaccines, such as:  Influenza vaccine. This is recommended  every year.  Tetanus, diphtheria, and acellular pertussis (Tdap, Td) vaccine. You may need a Td booster every 10 years.  Zoster vaccine. You may need this after age 39.  Pneumococcal 13-valent conjugate  (PCV13) vaccine. One dose is recommended after age 16.  Pneumococcal polysaccharide (PPSV23) vaccine. One dose is recommended after age 70. Talk to your health care provider about which screenings and vaccines you need and how often you need them. This information is not intended to replace advice given to you by your health care provider. Make sure you discuss any questions you have with your health care provider. Document Released: 11/21/2015 Document Revised: 07/14/2016 Document Reviewed: 08/26/2015 Elsevier Interactive Patient Education  2017 [place] Prevention in the Home Falls can cause injuries. They can happen to people of all ages. There are many things you can do to make your home safe and to help prevent falls. What can I do on the outside of my home?  Regularly fix the edges of walkways and driveways and fix any cracks.  Remove anything that might make you trip as you walk through a door, such as a raised step or threshold.  Trim any bushes or trees on the path to your home.  Use bright outdoor lighting.  Clear any walking paths of anything that might make someone trip, such as rocks or tools.  Regularly check to see if handrails are loose or broken. Make sure that both sides of any steps have handrails.  Any raised decks and porches should have guardrails on the edges.  Have any leaves, snow, or ice cleared regularly.  Use sand or salt on walking paths during winter.  Clean up any spills in your garage right away. This includes oil or grease spills. What can I do in the bathroom?  Use night lights.  Install grab bars by the toilet and in the tub and shower. Do not use towel bars as grab bars.  Use non-skid mats or decals in the tub or shower.  If you need to sit down in the shower, use a plastic, non-slip stool.  Keep the floor dry. Clean up any water that spills on the floor as soon as it happens.  Remove soap buildup in the tub or shower  regularly.  Attach bath mats securely with double-sided non-slip rug tape.  Do not have throw rugs and other things on the floor that can make you trip. What can I do in the bedroom?  Use night lights.  Make sure that you have a light by your bed that is easy to reach.  Do not use any sheets or blankets that are too big for your bed. They should not hang down onto the floor.  Have a firm chair that has side arms. You can use this for support while you get dressed.  Do not have throw rugs and other things on the floor that can make you trip. What can I do in the kitchen?  Clean up any spills right away.  Avoid walking on wet floors.  Keep items that you use a lot in easy-to-reach places.  If you need to reach something above you, use a strong step stool that has a grab bar.  Keep electrical cords out of the way.  Do not use floor polish or wax that makes floors slippery. If you must use wax, use non-skid floor wax.  Do not have throw rugs and other things on the floor that can make you trip. What  can I do with my stairs?  Do not leave any items on the stairs.  Make sure that there are handrails on both sides of the stairs and use them. Fix handrails that are broken or loose. Make sure that handrails are as long as the stairways.  Check any carpeting to make sure that it is firmly attached to the stairs. Fix any carpet that is loose or worn.  Avoid having throw rugs at the top or bottom of the stairs. If you do have throw rugs, attach them to the floor with carpet tape.  Make sure that you have a light switch at the top of the stairs and the bottom of the stairs. If you do not have them, ask someone to add them for you. What else can I do to help prevent falls?  Wear shoes that:  Do not have high heels.  Have rubber bottoms.  Are comfortable and fit you well.  Are closed at the toe. Do not wear sandals.  If you use a stepladder:  Make sure that it is fully  opened. Do not climb a closed stepladder.  Make sure that both sides of the stepladder are locked into place.  Ask someone to hold it for you, if possible.  Clearly mark and make sure that you can see:  Any grab bars or handrails.  First and last steps.  Where the edge of each step is.  Use tools that help you move around (mobility aids) if they are needed. These include:  Canes.  Walkers.  Scooters.  Crutches.  Turn on the lights when you go into a dark area. Replace any light bulbs as soon as they burn out.  Set up your furniture so you have a clear path. Avoid moving your furniture around.  If any of your floors are uneven, fix them.  If there are any pets around you, be aware of where they are.  Review your medicines with your doctor. Some medicines can make you feel dizzy. This can increase your chance of falling. Ask your doctor what other things that you can do to help prevent falls. This information is not intended to replace advice given to you by your health care provider. Make sure you discuss any questions you have with your health care provider. Document Released: 08/21/2009 Document Revised: 04/01/2016 Document Reviewed: 11/29/2014 Elsevier Interactive Patient Education  2017 [employer].

## 2020-11-24 NOTE — Progress Notes (Signed)
I connected with Jeff [name]. today by telephone and verified that I am speaking with the correct person using two identifiers. Location patient: home Location provider: work Persons participating in the virtual visit: Colleen Ruhaan, [place] LPN.   I discussed the limitations, risks, security and privacy concerns of performing an evaluation and management service by telephone and the availability of in person appointments. I also discussed with the patient that there may be a patient responsible charge related to this service. The patient expressed understanding and verbally consented to this telephonic visit.    Interactive audio and video telecommunications were attempted between this provider and patient, however failed, due to patient having technical difficulties OR patient did not have access to video capability.  We continued and completed visit with audio only.     Vital signs may be patient reported or missing  Subjective:   Jeff [name]. is a 70 y.o. male who presents for Medicare Annual/Subsequent preventive examination.  Review of Systems     Cardiac Risk Factors include: advanced age (>73men, >17 women);diabetes mellitus;dyslipidemia;hypertension;male gender;obesity (BMI >30kg/m2)     Objective:    Today's Vitals   11/24/20 0856  Weight: 265 lb (120.2 kg)  Height: 5\' 10"  (1.778 m)   Body mass index is 38.02 kg/m.  Advanced Directives 11/24/2020 10/06/2019 10/06/2019 08/11/2019 05/31/2019 05/10/2019 01/06/2019  Does Patient Have a Medical Advance Directive? Yes Yes No No Yes Yes No  Type of 01/08/2019 of [place];Living will Healthcare Power of [place];Living will - - Healthcare Power of [place];Living will Living will;Healthcare Power of Attorney -  Does patient want to make changes to medical advance directive? - No - Patient declined - - No - Patient declined - -  Copy of Healthcare Power of Attorney in Chart? No - copy requested No  - copy requested - - Yes - validated most recent copy scanned in chart (See row information) No - copy requested -  Would patient like information on creating a medical advance directive? - - - No - Patient declined - - No - Patient declined    Current Medications (verified) Outpatient Encounter Medications as of 11/24/2020  Medication Sig  . albuterol (VENTOLIN HFA) 108 (90 Base) MCG/ACT inhaler Inhale 2 puffs into the lungs every 4 (four) hours as needed for wheezing or shortness of breath.  11/26/2020 amLODipine (NORVASC) 5 MG tablet Take 1 tablet (5 mg total) by mouth daily.  Marland Kitchen aspirin 325 MG tablet Take 325 mg by mouth daily.  Marland Kitchen atorvastatin (LIPITOR) 20 MG tablet TAKE 1/2 TABLET BY MOUTH DAILY  . benazepril (LOTENSIN) 40 MG tablet TAKE 1 TABLET BY MOUTH EVERY DAY  . chlorpheniramine-HYDROcodone (TUSSIONEX PENNKINETIC ER) 10-8 MG/5ML SUER Take 5 mLs by mouth every 12 (twelve) hours as needed.  . doxazosin (CARDURA) 2 MG tablet Take 1 tablet (2 mg total) by mouth 2 (two) times daily.  . fluticasone (FLONASE) 50 MCG/ACT nasal spray Place into both nostrils daily as needed for allergies or rhinitis.  . metFORMIN (GLUCOPHAGE XR) 500 MG 24 hr tablet Take 2 tablets (1,000 mg total) by mouth 2 (two) times daily as needed.  . metoprolol tartrate (LOPRESSOR) 100 MG tablet TAKE 1 TABLET BY MOUTH TWICE A DAY  . XARELTO 20 MG TABS tablet TAKE 1 TABLET BY MOUTH EVERY DAY  . furosemide (LASIX) 20 MG tablet Take 1 tablet (20 mg total) by mouth daily.   No facility-administered encounter medications on file as of 11/24/2020.  Allergies (verified) Patient has no known allergies.   History: Past Medical History:  Diagnosis Date  . Chronic deep vein thrombosis (DVT) (HCC)   . Chronic low back pain   . Chronic systolic heart failure (HCC)    a. TTE 2011 with EF 40-45% per notes  . Diabetes mellitus without complication (HCC)   . Erectile dysfunction   . HLD (hyperlipidemia)   . Hypertension   .  Hypogonadism in male   . Permanent atrial fibrillation (HCC)    a. s/p DCCV 2011; b. redeveloped Afib in 2016; c. CHADS2VASc at least 5 (CHF, HTN, age x 1, DM, vascular disease); d. on Xarelto  . Torn meniscus    right knee   Past Surgical History:  Procedure Laterality Date  . COLONOSCOPY WITH PROPOFOL N/A 05/28/2016   Procedure: COLONOSCOPY WITH PROPOFOL;  Surgeon: Midge Miniumarren Wohl, MD;  [facility];  Service: Endoscopy;  Laterality: N/A;  diabetic - oral meds  . DVT, leg Left   . INSERTION OF VENA CAVA FILTER  2014  . KNEE ARTHROSCOPY WITH MEDIAL MENISECTOMY Right 05/31/2019   Procedure: KNEE ARTHROSCOPY WITH partial  MEDIAL MENISECTOMY;  Surgeon: Signa KellPatel, Sunny, MD;  [facility];  Service: Orthopedics;  Laterality: Right;  . PERIPHERAL VASCULAR THROMBECTOMY Left 12/01/2017   Procedure: PERIPHERAL VASCULAR THROMBECTOMY;  Surgeon: Renford DillsSchnier, Gregory G, MD;  Location: [facility] INVASIVE CV LAB;  Service: Cardiovascular;  Laterality: Left;   Family History  Problem Relation Age of Onset  . Lymphoma Mother   . Heart failure Father   . Diabetes Father   . Diabetes Mellitus II Other   . Heart attack Neg Hx   . Hypertension Neg Hx   . Cancer Neg Hx   . COPD Neg Hx   . Stroke Neg Hx   . Prostate cancer Neg Hx   . Kidney cancer Neg Hx   . Bladder Cancer Neg Hx    Social History   Socioeconomic History  . Marital status: Single    Spouse name: Not on file  . Number of children: Not on file  . Years of education: Not on file  . Highest education level: Some college, no degree  Occupational History  . Occupation: [employer]   Tobacco Use  . Smoking status: Never Smoker  . Smokeless tobacco: Never Used  Vaping Use  . Vaping Use: Never used  Substance and Sexual Activity  . Alcohol use: No    Alcohol/week: 0.0 standard drinks  . Drug use: No  . Sexual activity: Not on file  Other Topics Concern  . Not on file  Social History Narrative   Former Physicist, medicalpolice  officer    Independent at baseline. [employer]   Works at [employer]    Social Determinants of [employer]   Financial Resource Strain: Low Risk   . Difficulty of Paying Living Expenses: Not hard at all  Food Insecurity: No Food Insecurity  . Worried About Programme researcher, broadcasting/film/videounning Out of Food in the Last Year: Never true  . Ran Out of Food in the Last Year: Never true  Transportation Needs: No Transportation Needs  . Lack of Transportation (Medical): No  . Lack of Transportation (Non-Medical): No  Physical Activity: Inactive  . Days of Exercise per Week: 0 days  . Minutes of Exercise per Session: 0 min  Stress: No Stress Concern Present  . Feeling of Stress : Not at all  Social Connections: Not on file    Tobacco Counseling Counseling  given: Not Answered   Clinical Intake:  Pre-visit preparation completed: Yes  Pain : No/denies pain     Nutritional Status: BMI > 30  Obese Nutritional Risks: None Diabetes: Yes  How often do you need to have someone help you when you read instructions, pamphlets, or other written materials from your doctor or pharmacy?: 1 - Never What is the last grade level you completed in school?: associate degree  Diabetic? Yes Nutrition Risk Assessment:  Has the patient had any N/V/D within the last 2 months?  Yes  Does the patient have any non-healing wounds?  No  Has the patient had any unintentional weight loss or weight gain?  Yes   Diabetes:  Is the patient diabetic?  Yes  If diabetic, was a CBG obtained today?  No  Did the patient bring in their glucometer from home?  No  How often do you monitor your CBG's? daily.   Financial Strains and Diabetes Management:  Are you having any financial strains with the device, your supplies or your medication? No .  Does the patient want to be seen by Chronic Care Management for management of their diabetes?  No  Would the patient like to be referred to a Nutritionist or for Diabetic Management?  No    Diabetic Exams:  Diabetic Eye Exam: Overdue for diabetic eye exam. Pt has been advised about the importance in completing this exam. Patient advised to call and schedule an eye exam. Diabetic Foot Exam: Overdue, Pt has been advised about the importance in completing this exam. Pt is scheduled for diabetic foot exam on next appointment.   Interpreter Needed?: No  Information entered by :: NAllen LPN   Activities of Daily Living In your present state of health, do you have any difficulty performing the following activities: 11/24/2020 11/11/2020  Hearing? N N  Vision? N N  Difficulty concentrating or making decisions? N N  Walking or climbing stairs? N N  Dressing or bathing? N N  Doing errands, shopping? N N  Preparing Food and eating ? N -  Using the Toilet? N -  In the past six months, have you accidently leaked urine? N -  Do you have problems with loss of bowel control? N -  Managing your Medications? N -  Managing your Finances? N -  Housekeeping or managing your Housekeeping? N -  Some recent data might be hidden    Patient Care Team: Dorcas Carrow, DO as PCP - General (Family Medicine) Mariah Milling Tollie Pizza, MD as PCP - Cardiology (Cardiology) Antonieta Iba, MD as Consulting Physician (Cardiology) Bud Face, MD as Referring Physician (Otolaryngology) Veva Holes, MD as Referring Physician (Diagnostic Radiology)  Indicate any recent Medical Services you may have received from other than [facility] providers in the past year (date may be approximate).     Assessment:   This is a routine wellness examination for Handsome.  Hearing/Vision screen  Hearing Screening   125Hz  250Hz  500Hz  1000Hz  2000Hz  3000Hz  4000Hz  6000Hz  8000Hz   Right ear:           Left ear:           Vision Screening Comments: Regular eye exams, [facility]  Dietary issues and exercise activities discussed: Current Exercise Habits: The patient has a physically strenuous job, but has no  regular exercise apart from work.  Goals    . DIET - INCREASE WATER INTAKE     Recommend drinking at least 6-8 glasses of water a  day     . Patient Stated     11/24/2020, stay active      Depression Screen PHQ 2/9 Scores 11/24/2020 11/11/2020 05/10/2019 03/02/2018 03/02/2018 02/23/2018 12/08/2017  PHQ - 2 Score 0 0 0 0 0 0 0    Fall Risk Fall Risk  11/24/2020 05/10/2019 09/07/2018 05/25/2018 03/02/2018  Falls in the past year? 0 0 No No No  Risk for fall due to : Medication side effect - - - -  Follow up Falls evaluation completed;Education provided;Falls prevention discussed - - - -    FALL RISK PREVENTION PERTAINING TO THE HOME:  Any stairs in or around the home? Yes  If so, are there any without handrails? No  Home free of loose throw rugs in walkways, pet beds, electrical cords, etc? Yes  Adequate lighting in your home to reduce risk of falls? Yes   ASSISTIVE DEVICES UTILIZED TO PREVENT FALLS:  Life alert? No  Use of a cane, walker or w/c? No  Grab bars in the bathroom? Yes  Shower chair or bench in shower? No  Elevated toilet seat or a handicapped toilet? No   TIMED UP AND GO:  Was the test performed? No .     Cognitive Function:     6CIT Screen 11/24/2020  What Year? 0 points  What month? 0 points  What time? 0 points  Count back from 20 0 points  Months in reverse 0 points  Repeat phrase 0 points  Total Score 0    Immunizations Immunization History  Administered Date(s) Administered  . Influenza, High Dose Seasonal PF 10/04/2016, 09/07/2017, 09/13/2018, 08/20/2019  . Influenza,inj,Quad PF,6+ Mos 09/01/2015  . Moderna Sars-Covid-2 Vaccination 08/08/2020, 09/04/2020  . Pneumococcal Conjugate-13 10/04/2016  . Pneumococcal Polysaccharide-23 07/30/2009, 02/23/2018  . Td 03/13/2020  . Zoster 03/18/2014    TDAP status: Up to date  Flu Vaccine status: Due, Education has been provided regarding the importance of this vaccine. Advised may receive this vaccine at  local pharmacy or Health Dept. Aware to provide a copy of the vaccination record if obtained from local pharmacy or Health Dept. Verbalized acceptance and understanding.  Pneumococcal vaccine status: Up to date  Covid-19 vaccine status: Completed vaccines  Qualifies for Shingles Vaccine? Yes   Zostavax completed Yes   Shingrix Completed?: No.    Education has been provided regarding the importance of this vaccine. Patient has been advised to call insurance company to determine out of pocket expense if they have not yet received this vaccine. Advised may also receive vaccine at local pharmacy or Health Dept. Verbalized acceptance and understanding.  Screening Tests Health Maintenance  Topic Date Due  . OPHTHALMOLOGY EXAM  01/27/2016  . FOOT EXAM  01/18/2017  . INFLUENZA VACCINE  06/08/2020  . HEMOGLOBIN A1C  09/13/2020  . COVID-19 Vaccine (3 - Booster for Moderna series) 03/05/2021  . COLONOSCOPY (Pts [id number] Insurance coverage will need to be confirmed)  05/28/2026  . TETANUS/TDAP  03/13/2030  . Hepatitis C Screening  Completed  . PNA vac Low Risk Adult  Completed    Health Maintenance  Health Maintenance Due  Topic Date Due  . OPHTHALMOLOGY EXAM  01/27/2016  . FOOT EXAM  01/18/2017  . INFLUENZA VACCINE  06/08/2020  . HEMOGLOBIN A1C  09/13/2020    Colorectal cancer screening: Type of screening: Colonoscopy. Completed 05/28/2016. Repeat every 10 years  Lung Cancer Screening: (Low Dose CT Chest recommended if Age 58-80 years, 30 pack-year currently smoking OR have quit w/in 15years.)  does not qualify.   Lung Cancer Screening Referral: no  Additional Screening:  Hepatitis C Screening: does qualify; Completed 02/23/2018  Vision Screening: Recommended annual ophthalmology exams for early detection of glaucoma and other disorders of the eye. Is the patient up to date with their annual eye exam?  No  Who is the provider or what is the name of the office in which the patient  attends annual eye exams? [facility] If pt is not established with a provider, would they like to be referred to a provider to establish care? No .   Dental Screening: Recommended annual dental exams for proper oral hygiene  Community Resource Referral / Chronic Care Management: CRR required this visit?  No   CCM required this visit?  No      Plan:     I have personally reviewed and noted the following in the patient's chart:   . Medical and social history . Use of alcohol, tobacco or illicit drugs  . Current medications and supplements . Functional ability and status . Nutritional status . Physical activity . Advanced directives . List of other physicians . Hospitalizations, surgeries, and ER visits in previous 12 months . Vitals . Screenings to include cognitive, depression, and falls . Referrals and appointments  In addition, I have reviewed and discussed with patient certain preventive protocols, quality metrics, and best practice recommendations. A written personalized care plan for preventive services as well as general preventive health recommendations were provided to patient.     Barb Merino, LPN   [id number]   Nurse Notes:

## 2020-11-25 ENCOUNTER — Ambulatory Visit: Payer: Medicare HMO | Admitting: Cardiovascular Disease

## 2020-12-07 ENCOUNTER — Other Ambulatory Visit: Payer: Self-pay | Admitting: Cardiovascular Disease

## 2020-12-08 NOTE — Telephone Encounter (Signed)
Rx request sent to pharmacy.  

## 2020-12-10 ENCOUNTER — Other Ambulatory Visit: Payer: Self-pay | Admitting: Family Medicine

## 2020-12-22 ENCOUNTER — Ambulatory Visit: Payer: Medicare HMO | Admitting: Cardiovascular Disease

## 2020-12-26 ENCOUNTER — Other Ambulatory Visit: Payer: Self-pay | Admitting: Cardiovascular Disease

## 2021-01-02 ENCOUNTER — Other Ambulatory Visit: Payer: Self-pay | Admitting: Family Medicine

## 2021-02-20 NOTE — Progress Notes (Signed)
Evaluation Performed:  Follow-up visit  Date:  02/23/2021   ID:  Jeff [name]., DOB 05/10/1951, MRN [id number]  Patient Location:  [address] [place] [address]   Provider location:   [facility], [place] office  PCP:  Dorcas Carrow, DO  Cardiologist:  Fonnie Mu  Chief Complaint  Patient presents with  . Follow-up    6 month F/U    History of Present Illness:    Ki Corbo. is a 70 y.o. male  past medical history of DM2  hemoglobin A1c 7.7 DVT LLE 2010, with filter (removed) hyperlipidemia  April 2011  atrial fibrillation with RVR,  emergency room on June 8 2011for rapid atrial fibrillation,  s/p cardioversion  lower extremity edema On calcium channel blockers,  Echo in 02/2010 shows EF 40-45%,  possibly tachycardia induced  Converted back to atrial fibrillation in 2016, at that time did not want cardioversion (CHF, HTN, age x 1, DM, vascular disease): IVC stenosis, followed [facility] 2  he presents for routine followup for chronic atrial fibrillation, and extensive DVT  LOV 05/2020 Still dancing, has to stop to recover after several songs Still very active at baseline Feels he is at his baseline Works part time, car part delivery Runs a kennel, 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 He was started on prednisone which elevated his blood sugar.  He was not admitted. He went to [facility] ER on Tuesday 10/6 at 6 am and sat in the ER in a cubicle until 10 pm that night, even being COVID +.  stayed there 6 days and was started on remdesivir and antibodies trial diagnosed with pneumonia as  well.  discharged/ quarantined for 20 days.   Chest pain in ER 12/2018 Hospital records reviewed with the patient in detail SOB, and coughing Took Mucinex and Zyrtec.  He thinks maybe it is a reaction to both of the medicines  chest tightness and warmth associated with some clammy feeling. Had pressure 185/93 Pulse 127  08/2018  Sepsis secondary to cellulitis of the left lower extremity and streptococcus bacteremia: Hospital records reviewed with the patient in detail  History of DVT left lower extremity  thrombus was occlusive left SFA down to popliteal   IVC filter in place from 2010  As outpatient seen by Dr. Lorretta Harp, "too much clot" Recommended lymphedema compression pumps  Referred to [facility] for thrombectomy Reports that he had thrombectomy procedure , took hours 5 pm to 10 pm No anesthesia, very painful  He has had follow-up lower extremity CT scan [facility] Confirming residual B/l extensive clot, iliacs extending downward   Prior CV studies:   The following studies were reviewed today:    Past Medical History:  Diagnosis Date  . Chronic deep vein thrombosis (DVT) (HCC)   . Chronic low back pain   . Chronic systolic heart failure (HCC)    a. TTE 2011 with EF 40-45% per notes  . Diabetes mellitus without complication (HCC)   . Erectile dysfunction   . HLD (hyperlipidemia)   . Hypertension   . Hypogonadism in male   .  Permanent atrial fibrillation (HCC)    a. s/p DCCV 2011; b. redeveloped Afib in 2016; c. CHADS2VASc at least 5 (CHF, HTN, age x 1, DM, vascular disease); d. on Xarelto  . Torn meniscus    right knee   Past Surgical History:  Procedure Laterality Date  . COLONOSCOPY WITH PROPOFOL N/A 05/28/2016   Procedure: COLONOSCOPY WITH PROPOFOL;  Surgeon: Midge Minium, MD;  [facility];  Service: Endoscopy;  Laterality: N/A;  diabetic - oral meds  . DVT, leg Left   . INSERTION OF VENA CAVA FILTER  2014  . KNEE ARTHROSCOPY WITH MEDIAL  MENISECTOMY Right 05/31/2019   Procedure: KNEE ARTHROSCOPY WITH partial  MEDIAL MENISECTOMY;  Surgeon: Signa Kell, MD;  [facility];  Service: Orthopedics;  Laterality: Right;  . PERIPHERAL VASCULAR THROMBECTOMY Left 12/01/2017   Procedure: PERIPHERAL VASCULAR THROMBECTOMY;  Surgeon: Renford Dills, MD;  Location: [facility] INVASIVE CV LAB;  Service: Cardiovascular;  Laterality: Left;     Current Meds  Medication Sig  . albuterol (VENTOLIN HFA) 108 (90 Base) MCG/ACT inhaler Inhale 2 puffs into the lungs every 4 (four) hours as needed for wheezing or shortness of breath.  Marland Kitchen amLODipine (NORVASC) 5 MG tablet Take 1 tablet (5 mg total) by mouth daily.  Marland Kitchen aspirin 325 MG tablet Take 325 mg by mouth daily.  Marland Kitchen atorvastatin (LIPITOR) 20 MG tablet TAKE 1/2 TABLET BY MOUTH DAILY  . benazepril (LOTENSIN) 40 MG tablet TAKE 1 TABLET BY MOUTH EVERY DAY  . chlorpheniramine-HYDROcodone (TUSSIONEX PENNKINETIC ER) 10-8 MG/5ML SUER Take 5 mLs by mouth every 12 (twelve) hours as needed.  . doxazosin (CARDURA) 2 MG tablet TAKE 1 TABLET (2 MG TOTAL) BY MOUTH 2 (TWO) TIMES DAILY.  . fluticasone (FLONASE) 50 MCG/ACT nasal spray Place into both nostrils daily as needed for allergies or rhinitis.  . furosemide (LASIX) 20 MG tablet TAKE 1 TABLET BY MOUTH EVERY DAY  . metFORMIN (GLUCOPHAGE-XR) 500 MG 24 hr tablet TAKE 2 TABLETS (1,000 MG TOTAL) BY MOUTH 2 (TWO) TIMES DAILY AS NEEDED.  . metoprolol tartrate (LOPRESSOR) 100 MG tablet TAKE 1 TABLET BY MOUTH TWICE A DAY  . XARELTO 20 MG TABS tablet TAKE 1 TABLET BY MOUTH EVERY DAY     Allergies:   Patient has no known allergies.   Social History   Tobacco Use  . Smoking status: Never Smoker  . Smokeless tobacco: Never Used  Vaping Use  . Vaping Use: Never used  Substance Use Topics  . Alcohol use: No    Alcohol/week: 0.0 standard drinks  . Drug use: No     Family Hx: The patient's family history includes Diabetes in his father; Diabetes  Mellitus II in an other family member; Heart failure in his father; Lymphoma in his mother. There is no history of Heart attack, Hypertension, Cancer, COPD, Stroke, Prostate cancer, Kidney cancer, or Bladder Cancer.  ROS:   Please see the history of present illness.    Review of Systems  Constitutional: Negative.   Respiratory: Negative.   Cardiovascular: Negative.   Gastrointestinal: Negative.   Musculoskeletal: Negative.   Neurological: Negative.   Psychiatric/Behavioral: Negative.   All other systems reviewed and are negative.    Labs/Other Tests and Data Reviewed:    Recent Labs: 03/13/2020: ALT 18; BUN 26; Creatinine, Ser 1.32; Hemoglobin 14.8; Platelets 190; Potassium 4.3; Sodium 140   Recent Lipid Panel Lab Results  Component Value Date/Time   CHOL 130 03/13/2020 03:10 PM   CHOL 130  09/07/2018 08:22 AM   TRIG 166 (H) 03/13/2020 03:10 PM   TRIG 95 09/07/2018 08:22 AM   TRIG 111 02/17/2010 12:00 AM   HDL 30 (L) 03/13/2020 03:10 PM   CHOLHDL 4.0 10/06/2019 12:53 PM   LDLCALC 71 03/13/2020 03:10 PM    Wt Readings from Last 3 Encounters:  02/23/21 283 lb (128.4 kg)  11/24/20 265 lb (120.2 kg)  05/20/20 288 lb 8 oz (130.9 kg)     Exam:    Vital Signs: Vital signs may also be detailed in the HPI BP 130/78 (BP Location: Left Arm, Patient Position: Sitting, Cuff Size: Large)   Pulse 77   Ht 5\' 10"  (1.778 m)   Wt 283 lb (128.4 kg)   SpO2 98%   BMI 40.61 kg/m   Constitutional:  oriented to person, place, and time. No distress.  HENT:  Head: Grossly normal Eyes:  no discharge. No scleral icterus.  Neck: No JVD, no carotid bruits  Cardiovascular: Regular rate and rhythm, no murmurs appreciated Pulmonary/Chest: Clear to auscultation bilaterally, no wheezes or rails Abdominal: Soft.  no distension.  no tenderness.  Musculoskeletal: Normal range of motion Neurological:  normal muscle tone. Coordination normal. No atrophy Skin: Skin warm and dry Psychiatric: normal  affect, pleasant   ASSESSMENT & PLAN:    Atrial fibrillation, permanent Continue Xarelto 20 daily Recommend he hold the aspirin Rate well controlled  Congestive dilated cardiomyopathy (HCC) Appears relatively euvolemic Will add Farxiga 10 mg daily or Jardiance 10 mg daily if insurance covers one of the other better  Type 2 diabetes mellitus with other circulatory complication, without long-term current use of insulin (HCC) Weight continues to run high, dietary indiscretion, Add Farxiga as above  HYPERTENSION, BENIGN Blood pressure is well controlled on today's visit. No changes made to the medications.  Mixed hyperlipidemia Cholesterol is at goal on the current lipid regimen. No changes to the medications were made.  Acute renal failure, unspecified acute renal failure type (HCC) Long history of diabetes,  Avoid NSAIDs Stressed importance of better diabetes control, weight loss  History of DVT Aspirin held, continue Xarelto   Total encounter time more than 25 minutes  Greater than 50% was spent in counseling and coordination of care with the patient    Signed, , MD  02/23/2021 10:38 AM    [facility] [address], [place], [place]

## 2021-02-23 ENCOUNTER — Ambulatory Visit (INDEPENDENT_AMBULATORY_CARE_PROVIDER_SITE_OTHER): Payer: Medicare HMO | Admitting: Cardiovascular Disease

## 2021-02-23 ENCOUNTER — Encounter: Payer: Self-pay | Admitting: Cardiovascular Disease

## 2021-02-23 ENCOUNTER — Other Ambulatory Visit: Payer: Self-pay

## 2021-02-23 VITALS — BP 130/78 | HR 77 | Ht 70.0 in | Wt 283.0 lb

## 2021-02-23 DIAGNOSIS — I482 Chronic atrial fibrillation, unspecified: Secondary | ICD-10-CM | POA: Diagnosis not present

## 2021-02-23 DIAGNOSIS — I420 Dilated cardiomyopathy: Secondary | ICD-10-CM

## 2021-02-23 MED ORDER — DAPAGLIFLOZIN PROPANEDIOL 10 MG PO TABS: 10.0000 mg | ORAL_TABLET | Freq: Every day | ORAL | 6 refills | Status: DC

## 2021-02-23 NOTE — Patient Instructions (Addendum)
Medication Instructions:  Jeff [name] 10 mg daily, coupon   Stop asa  If you need a refill on your cardiac medications before your next appointment, please call your pharmacy.    Lab work: No new labs needed  I will send a message to Aundra Millet   If you have labs (blood work) drawn today and your tests are completely normal, you will receive your results only by: Marland Kitchen MyChart Message (if you have MyChart) OR . A paper copy in the mail If you have any lab test that is abnormal or we need to change your treatment, we will call you to review the results.   Testing/Procedures: No new testing needed   Follow-Up: At [facility], you and your health needs are our priority.  As part of our continuing mission to provide you with exceptional heart care, we have created designated Provider Care Teams.  These Care Teams include your primary Cardiologist (physician) and Advanced Practice Providers (APPs -  Physician Assistants and Nurse Practitioners) who all work together to provide you with the care you need, when you need it.  . You will need a follow up appointment in 12 months  . Providers on your designated Care Team:   . Nicolasa Ducking, NP . Eula Listen, PA-C . Marisue Ivan, PA-C  Any Other Special Instructions Will Be Listed Below (If Applicable).  COVID-19 Vaccine Information can be found at: [email or web address] For questions related to vaccine distribution or appointments, please email [email or web address] or call [phone].

## 2021-02-26 IMAGING — MR MRI OF THE RIGHT KNEE WITHOUT CONTRAST
6 series · 40 of 40 positions shown · non-contrast
Comparison: None.

CLINICAL DATA: Chronic knee pain.  Instability.

EXAM:
MRI OF THE RIGHT KNEE WITHOUT CONTRAST
TECHNIQUE: Multiplanar, multisequence MR imaging of the knee was performed. No
intravenous contrast was administered.

[Series 8: T2 fat-sat · axial · right · 4.0mm · 0.50mm/px · z∈[-75,+50]mm · 6 of 26 slices shown (1 of 3)]
[im 1/26]
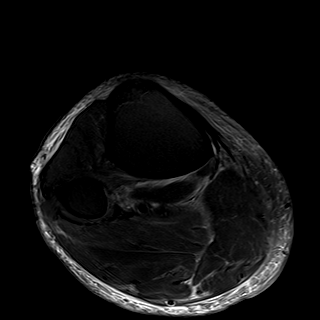
[im 6/26]
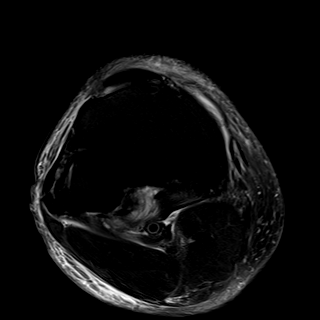
[im 11/26]
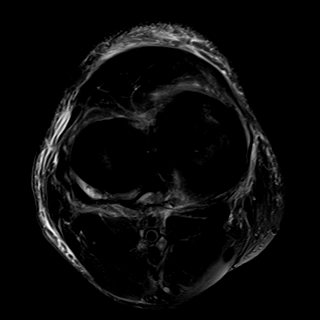
[im 16/26]
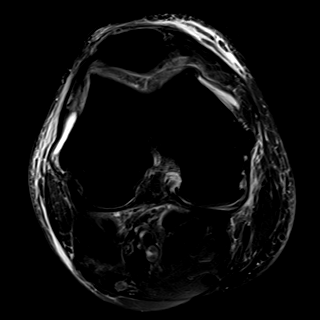
[im 21/26]
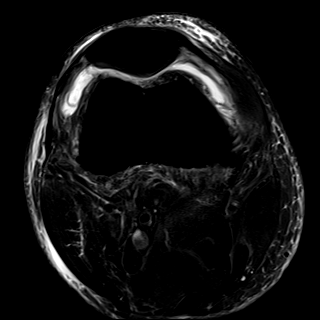
[im 26/26]
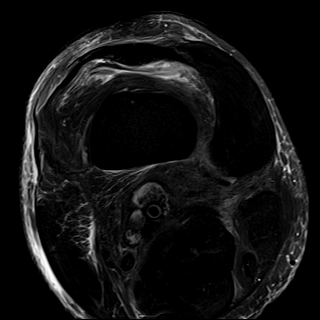

[Series 9: T1 · coronal · right · 4.0mm · 0.59mm/px · 6 of 30 slices shown]
[im 1/30]
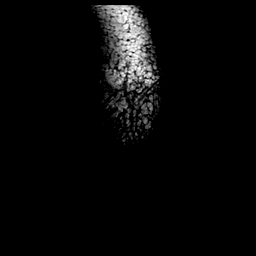
[im 6/30]
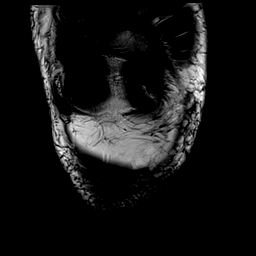
[im 12/30]
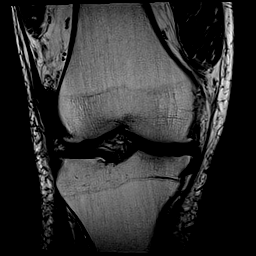
[im 18/30]
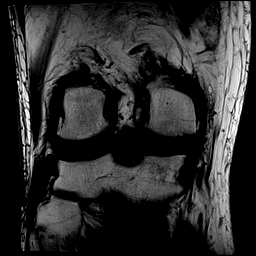
[im 24/30]
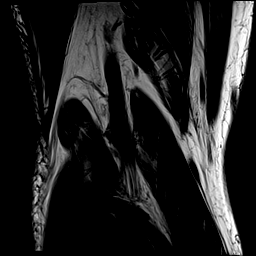
[im 30/30]
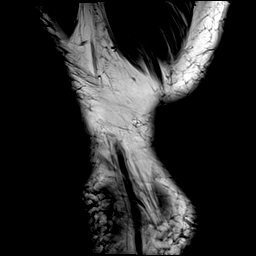

[Series 10: T2 fat-sat · coronal · right · 4.0mm · 0.59mm/px · 6 of 30 slices shown (2 of 3)]
[im 1/30]
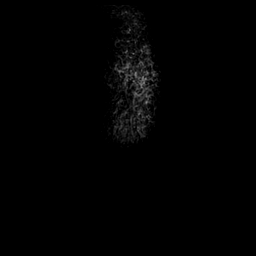
[im 6/30]
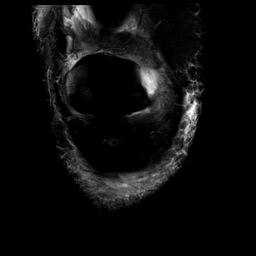
[im 12/30]
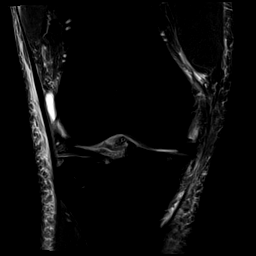
[im 18/30]
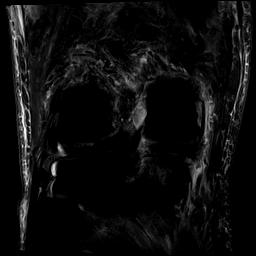
[im 24/30]
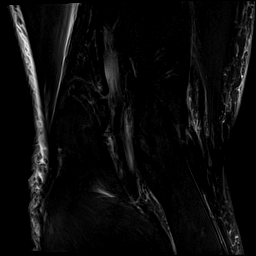
[im 30/30]
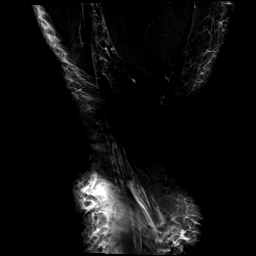

[Series 11: PD fat-sat · coronal · right · 4.0mm · 0.59mm/px · 6 of 30 slices shown (1 of 2)]
[im 1/30]
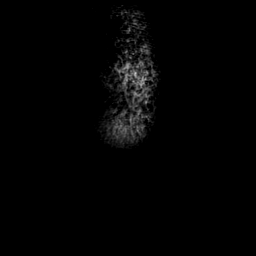
[im 6/30]
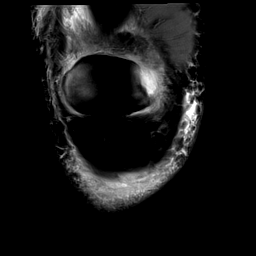
[im 12/30]
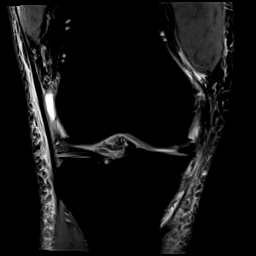
[im 18/30]
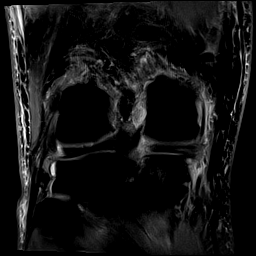
[im 24/30]
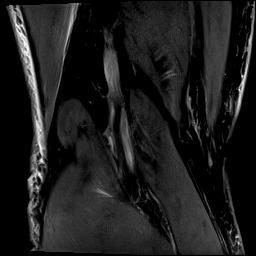
[im 30/30]
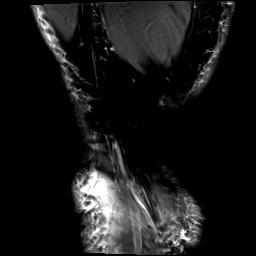

[Series 12: PD fat-sat · sagittal · right · 3.0mm · 0.59mm/px · 8 of 36 slices shown (2 of 2)]
[im 1/36]
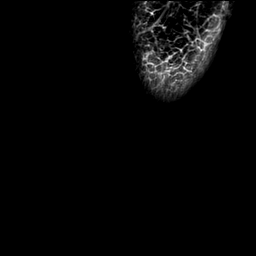
[im 6/36]
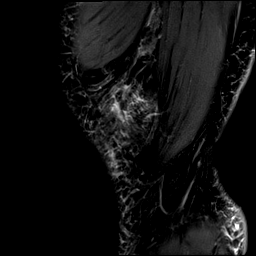
[im 11/36]
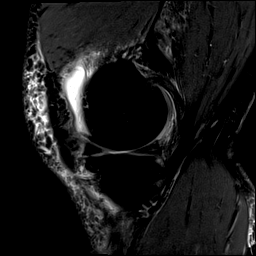
[im 16/36]
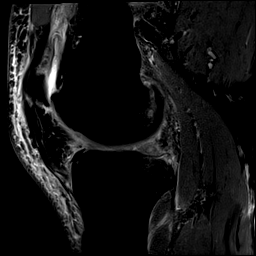
[im 21/36]
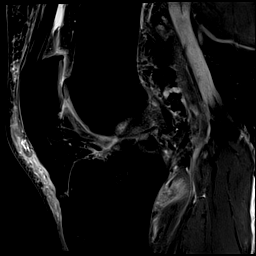
[im 26/36]
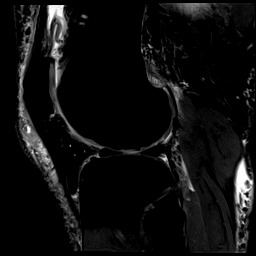
[im 31/36]
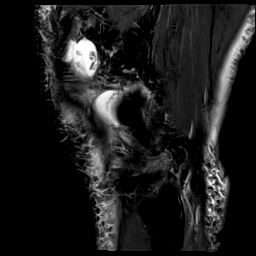
[im 36/36]
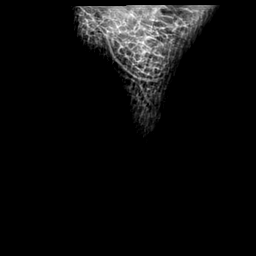

[Series 13: T2 fat-sat · sagittal · right · 3.0mm · 0.59mm/px · 8 of 36 slices shown (3 of 3)]
[im 1/36]
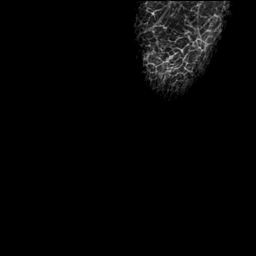
[im 6/36]
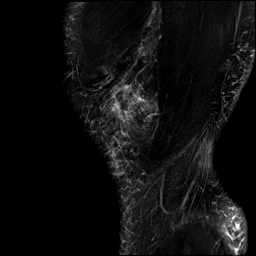
[im 11/36]
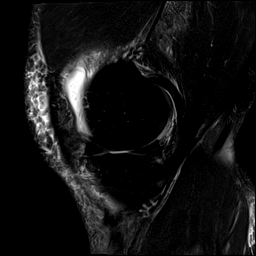
[im 16/36]
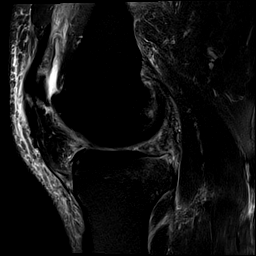
[im 21/36]
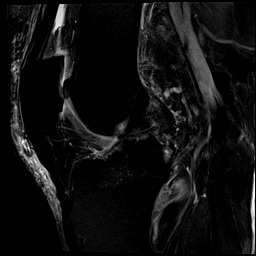
[im 26/36]
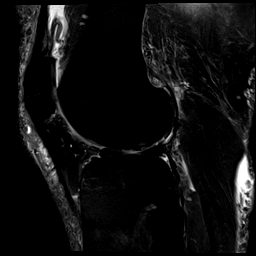
[im 31/36]
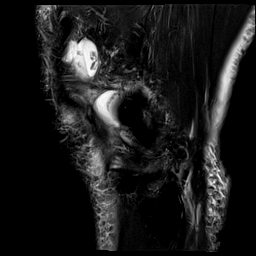
[im 36/36]
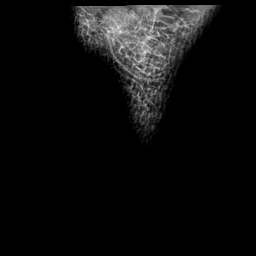

[40 of 40 positions shown; findings below may reference images not displayed]

FINDINGS: MENISCI

Medial meniscus: Radial tear of the posterior horn of the medial
meniscus at the root with peripheral meniscal extrusion. Increased
signal in the remainder the posterior horn and body of the medial
meniscus consistent with degeneration.

Lateral meniscus:  Intact.

LIGAMENTS

Cruciates:  Intact ACL and PCL.

Collaterals: Medial collateral ligament is intact. Lateral
collateral ligament complex is intact.

CARTILAGE

Patellofemoral: Partial-thickness cartilage loss of the medial and
lateral patellofemoral compartments.

Medial: High-grade partial-thickness cartilage loss of the medial
femorotibial compartment with small marginal osteophytes.

Lateral: Partial-thickness cartilage loss of the lateral femoral
condyle along the posterior weight-bearing surface. Small marginal
osteophytes.

Joint: Small joint effusion. Mild edema in Hoffa's fat. No plical
thickening.

Popliteal Fossa:  No Baker cyst. Intact popliteus tendon.

Extensor Mechanism: Intact quadriceps tendon. Intact patellar
tendon. Intact medial patellar retinaculum. Intact lateral patellar
retinaculum. Intact MPFL.

Bones: No acute osseous abnormality. No aggressive osseous lesion.
Subcortical reactive marrow changes at the ACL insertion.
Subcortical reactive marrow changes at the root of the posterior
horn of the medial meniscus.

Other: No fluid collection or hematoma.  Muscles are normal.
IMPRESSION: 1. Radial tear of the posterior horn of the medial meniscus at the
root with peripheral meniscal extrusion. Increased signal in the
remainder the posterior horn and body of the medial meniscus
consistent with degeneration.
2. Tricompartmental cartilage abnormalities as described above most
severe in the medial femorotibial compartment.

## 2021-02-28 ENCOUNTER — Other Ambulatory Visit: Payer: Self-pay | Admitting: Cardiovascular Disease

## 2021-03-02 NOTE — Telephone Encounter (Signed)
Refill Request.  

## 2021-03-02 NOTE — Telephone Encounter (Signed)
24m, 128.4kg, scr 1.32 03/13/20, lovw/gollan 02/23/21, ccr 94.6

## 2021-03-07 ENCOUNTER — Other Ambulatory Visit: Payer: Self-pay | Admitting: Family Medicine

## 2021-03-07 DIAGNOSIS — E782 Mixed hyperlipidemia: Secondary | ICD-10-CM

## 2021-03-07 NOTE — Telephone Encounter (Signed)
Requested Prescriptions  Pending Prescriptions Disp Refills  . atorvastatin (LIPITOR) 20 MG tablet [Pharmacy Med Name: ATORVASTATIN 20 MG TABLET] 45 tablet 0    Sig: TAKE 1/2 TABLET BY MOUTH DAILY     Cardiovascular:  Antilipid - Statins Failed - 03/07/2021  9:32 AM      Failed - HDL in normal range and within 360 days    HDL  Date Value Ref Range Status  03/13/2020 30 (L) >39 mg/dL Final         Failed - Triglycerides in normal range and within 360 days    Triglycerides  Date Value Ref Range Status  03/13/2020 166 (H) 0 - 149 mg/dL Final   Triglyceride fasting, serum  Date Value Ref Range Status  02/17/2010 111 mg/dL    Triglycerides Piccolo,Waived  Date Value Ref Range Status  09/07/2018 95 <150 mg/dL Final    Comment:                            Normal                   <150                         Borderline High     150 - 199                         High                200 - 499                         Very High                >499          Passed - Total Cholesterol in normal range and within 360 days    Cholesterol, Total  Date Value Ref Range Status  03/13/2020 130 100 - 199 mg/dL Final   Cholesterol Piccolo, Waived  Date Value Ref Range Status  09/07/2018 130 <200 mg/dL Final    Comment:                            Desirable                <200                         Borderline High      200- 239                         High                     >239          Passed - LDL in normal range and within 360 days    LDL Chol Calc (NIH)  Date Value Ref Range Status  03/13/2020 71 0 - 99 mg/dL Final         Passed - Patient is not pregnant      Passed - Valid encounter within last 12 months    Recent Outpatient Visits          3 months ago Viral URI   [facility] Caro Laroche, DO  10 months ago Cellulitis of left lower extremity   [facility] [place], Megan P, DO   11 months ago Upper respiratory tract infection, unspecified  type   [facility], Megan P, DO   11 months ago Cellulitis of left lower extremity   [facility] [place], Megan P, DO   1 year ago Essential hypertension, benign   [facility] [place], Megan P, DO      Future Appointments            In 8 months [facility]

## 2021-04-03 ENCOUNTER — Other Ambulatory Visit: Payer: Self-pay | Admitting: Cardiovascular Disease

## 2021-04-04 ENCOUNTER — Other Ambulatory Visit: Payer: Self-pay | Admitting: Family Medicine

## 2021-04-04 NOTE — Telephone Encounter (Signed)
Requested medication (s) are due for refill today: yes  Requested medication (s) are on the active medication list: yes  Last refill:  01/02/21  Future visit scheduled: no  Notes to clinic:  overdue lab work   Requested Prescriptions  Pending Prescriptions Disp Refills   benazepril (LOTENSIN) 40 MG tablet [Pharmacy Med Name: BENAZEPRIL HCL 40 MG TABLET] 90 tablet 0    Sig: TAKE 1 TABLET BY MOUTH EVERY DAY      Cardiovascular:  ACE Inhibitors Failed - 04/04/2021  9:14 AM      Failed - Cr in normal range and within 180 days    Creatinine  Date Value Ref Range Status  04/06/2013 0.83 0.60 - 1.30 mg/dL Final   Creatinine, Ser  Date Value Ref Range Status  03/13/2020 1.32 (H) 0.76 - 1.27 mg/dL Final          Failed - K in normal range and within 180 days    Potassium  Date Value Ref Range Status  03/13/2020 4.3 3.5 - 5.2 mmol/L Final  04/06/2013 4.0 3.5 - 5.1 mmol/L Final          Passed - Patient is not pregnant      Passed - Last BP in normal range    BP Readings from Last 1 Encounters:  02/23/21 130/78          Passed - Valid encounter within last 6 months    Recent Outpatient Visits           4 months ago Viral URI   [facility] Caro Laroche, DO   11 months ago Cellulitis of left lower extremity   [facility] [place], Megan P, DO   1 year ago Upper respiratory tract infection, unspecified type   [facility] [place], Megan P, DO   1 year ago Cellulitis of left lower extremity   [facility] [place], Megan P, DO   1 year ago Essential hypertension, benign   [facility] [place], Megan P, DO       Future Appointments             In 8 months [facility]

## 2021-04-06 ENCOUNTER — Emergency Department: Payer: Medicare HMO

## 2021-04-06 ENCOUNTER — Emergency Department
Admission: EM | Admit: 2021-04-06 | Discharge: 2021-04-06 | Disposition: A | Discharge status: Home / Self Care | Payer: Medicare HMO | Attending: Emergency Medicine | Admitting: Emergency Medicine

## 2021-04-06 ENCOUNTER — Encounter: Payer: Self-pay | Admitting: Intensive Care

## 2021-04-06 ENCOUNTER — Other Ambulatory Visit: Payer: Self-pay

## 2021-04-06 DIAGNOSIS — S6992XA Unspecified injury of left wrist, hand and finger(s), initial encounter: Secondary | ICD-10-CM | POA: Diagnosis not present

## 2021-04-06 DIAGNOSIS — Z7984 Long term (current) use of oral hypoglycemic drugs: Secondary | ICD-10-CM | POA: Insufficient documentation

## 2021-04-06 DIAGNOSIS — E782 Mixed hyperlipidemia: Secondary | ICD-10-CM | POA: Insufficient documentation

## 2021-04-06 DIAGNOSIS — I5022 Chronic systolic (congestive) heart failure: Secondary | ICD-10-CM | POA: Diagnosis not present

## 2021-04-06 DIAGNOSIS — S60222A Contusion of left hand, initial encounter: Secondary | ICD-10-CM | POA: Diagnosis not present

## 2021-04-06 DIAGNOSIS — I110 Hypertensive heart disease with heart failure: Secondary | ICD-10-CM | POA: Insufficient documentation

## 2021-04-06 DIAGNOSIS — Z79899 Other long term (current) drug therapy: Secondary | ICD-10-CM | POA: Diagnosis not present

## 2021-04-06 DIAGNOSIS — M1712 Unilateral primary osteoarthritis, left knee: Secondary | ICD-10-CM | POA: Diagnosis not present

## 2021-04-06 DIAGNOSIS — S8002XA Contusion of left knee, initial encounter: Secondary | ICD-10-CM | POA: Diagnosis not present

## 2021-04-06 DIAGNOSIS — Z7901 Long term (current) use of anticoagulants: Secondary | ICD-10-CM | POA: Insufficient documentation

## 2021-04-06 DIAGNOSIS — E1169 Type 2 diabetes mellitus with other specified complication: Secondary | ICD-10-CM | POA: Diagnosis not present

## 2021-04-06 DIAGNOSIS — Y92410 Unspecified street and highway as the place of occurrence of the external cause: Secondary | ICD-10-CM | POA: Diagnosis not present

## 2021-04-06 DIAGNOSIS — Z86718 Personal history of other venous thrombosis and embolism: Secondary | ICD-10-CM | POA: Insufficient documentation

## 2021-04-06 MED ORDER — BACITRACIN-NEOMYCIN-POLYMYXIN 400-5-5000 EX OINT
TOPICAL_OINTMENT | Freq: Once | CUTANEOUS | Status: AC
  Administered 2021-04-06: 1 via TOPICAL
  Filled 2021-04-06: qty 1

## 2021-04-06 MED ORDER — ACETAMINOPHEN 325 MG PO TABS
650.0000 mg | ORAL_TABLET | Freq: Once | ORAL | Status: AC
  Administered 2021-04-06: 650 mg via ORAL
  Filled 2021-04-06: qty 2

## 2021-04-06 NOTE — Discharge Instructions (Addendum)
Your exam and x-rays are normal and reassuring at this time.  He may experience some ongoing bruising and bleeding secondary to your blood thinner.  Apply ice to reduce any swelling.  Take Tylenol as needed for pain relief.  Follow-up with your primary provider for ongoing symptoms.
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
## 2021-04-08 NOTE — Telephone Encounter (Signed)
Lvm to call us back pt needs apt

## 2021-04-09 NOTE — Telephone Encounter (Signed)
LVM TO MAKE APT 

## 2021-04-13 ENCOUNTER — Encounter: Payer: Self-pay | Admitting: Family Medicine

## 2021-04-13 NOTE — Telephone Encounter (Signed)
Routing to provider  

## 2021-04-13 NOTE — Telephone Encounter (Signed)
FYI LVM TO MAKE APT Sent letter

## 2021-05-07 ENCOUNTER — Other Ambulatory Visit: Payer: Self-pay | Admitting: Family Medicine

## 2021-05-07 NOTE — Telephone Encounter (Signed)
Lvm to make this apt. 

## 2021-05-07 NOTE — Telephone Encounter (Signed)
   Notes to clinic:  Patient not scheduled until 11/30/2020 Review for refills until that time   Requested Prescriptions  Pending Prescriptions Disp Refills   benazepril (LOTENSIN) 40 MG tablet [Pharmacy Med Name: BENAZEPRIL HCL 40 MG TABLET] 30 tablet 0    Sig: Take 1 tablet (40 mg total) by mouth daily. Please call to make an appointment for more refills      Cardiovascular:  ACE Inhibitors Failed - 05/07/2021  1:33 PM      Failed - Cr in normal range and within 180 days    Creatinine  Date Value Ref Range Status  04/06/2013 0.83 0.60 - 1.30 mg/dL Final   Creatinine, Ser  Date Value Ref Range Status  03/13/2020 1.32 (H) 0.76 - 1.27 mg/dL Final          Failed - K in normal range and within 180 days    Potassium  Date Value Ref Range Status  03/13/2020 4.3 3.5 - 5.2 mmol/L Final  04/06/2013 4.0 3.5 - 5.1 mmol/L Final          Passed - Patient is not pregnant      Passed - Last BP in normal range    BP Readings from Last 1 Encounters:  04/06/21 125/86          Passed - Valid encounter within last 6 months    Recent Outpatient Visits           5 months ago Viral URI   [facility] Caro Laroche, DO   1 year ago Cellulitis of left lower extremity   [facility] [place], Megan P, DO   1 year ago Upper respiratory tract infection, unspecified type   [facility] [place], Megan P, DO   1 year ago Cellulitis of left lower extremity   [facility] [place], Megan P, DO   1 year ago Essential hypertension, benign   [facility] [place], Megan P, DO       Future Appointments             In 6 months [facility]

## 2021-05-16 ENCOUNTER — Other Ambulatory Visit: Payer: Self-pay | Admitting: Cardiovascular Disease

## 2021-05-19 ENCOUNTER — Other Ambulatory Visit: Payer: Self-pay | Admitting: Family Medicine

## 2021-05-19 ENCOUNTER — Encounter: Payer: Self-pay | Admitting: Family Medicine

## 2021-05-19 DIAGNOSIS — E782 Mixed hyperlipidemia: Secondary | ICD-10-CM

## 2021-05-19 NOTE — Telephone Encounter (Signed)
Requested medication (s) are due for refill today: yes   Requested medication (s) are on the active medication list: yes   Last refill:  03/07/2021  Future visit scheduled: no  Notes to clinic: Needs labs    Requested Prescriptions  Pending Prescriptions Disp Refills   atorvastatin (LIPITOR) 20 MG tablet [Pharmacy Med Name: ATORVASTATIN 20 MG TABLET] 45 tablet 0    Sig: TAKE 1/2 TABLET BY MOUTH EVERY DAY      Cardiovascular:  Antilipid - Statins Failed - 05/19/2021  9:56 AM      Failed - Total Cholesterol in normal range and within 360 days    Cholesterol, Total  Date Value Ref Range Status  03/13/2020 130 100 - 199 mg/dL Final   Cholesterol Piccolo, Waived  Date Value Ref Range Status  09/07/2018 130 <200 mg/dL Final    Comment:                            Desirable                <200                         Borderline High      200- 239                         High                     >239           Failed - LDL in normal range and within 360 days    LDL Chol Calc (NIH)  Date Value Ref Range Status  03/13/2020 71 0 - 99 mg/dL Final          Failed - HDL in normal range and within 360 days    HDL  Date Value Ref Range Status  03/13/2020 30 (L) >39 mg/dL Final          Failed - Triglycerides in normal range and within 360 days    Triglycerides  Date Value Ref Range Status  03/13/2020 166 (H) 0 - 149 mg/dL Final   Triglyceride fasting, serum  Date Value Ref Range Status  02/17/2010 111 mg/dL    Triglycerides Piccolo,Waived  Date Value Ref Range Status  09/07/2018 95 <150 mg/dL Final    Comment:                            Normal                   <150                         Borderline High     150 - 199                         High                200 - 499                         Very High                >499           Passed - Patient  is not pregnant      Passed - Valid encounter within last 12 months    Recent Outpatient Visits           6  months ago Viral URI   [facility] Caro Laroche, DO   1 year ago Cellulitis of left lower extremity   [facility] [place], Megan P, DO   1 year ago Upper respiratory tract infection, unspecified type   [facility] [place], Megan P, DO   1 year ago Cellulitis of left lower extremity   [facility] [place], Megan P, DO   1 year ago Essential hypertension, benign   [facility] [place], Megan P, DO       Future Appointments             In 6 months [facility]

## 2021-05-19 NOTE — Telephone Encounter (Signed)
SENT Letter as multiple attempts have been made to reach pt.

## 2021-05-21 NOTE — Telephone Encounter (Signed)
Pt is due for  f.u apt for this med refills  

## 2021-05-22 ENCOUNTER — Other Ambulatory Visit: Payer: Self-pay | Admitting: Family Medicine

## 2021-05-26 ENCOUNTER — Encounter: Payer: Self-pay | Admitting: Family Medicine

## 2021-05-26 ENCOUNTER — Other Ambulatory Visit: Payer: Self-pay

## 2021-05-26 ENCOUNTER — Ambulatory Visit (INDEPENDENT_AMBULATORY_CARE_PROVIDER_SITE_OTHER): Payer: Medicare HMO | Admitting: Family Medicine

## 2021-05-26 VITALS — BP 128/76 | HR 76 | Temp 97.6°F | Ht 69.7 in | Wt 276.8 lb

## 2021-05-26 DIAGNOSIS — E782 Mixed hyperlipidemia: Secondary | ICD-10-CM | POA: Diagnosis not present

## 2021-05-26 DIAGNOSIS — E1159 Type 2 diabetes mellitus with other circulatory complications: Secondary | ICD-10-CM | POA: Diagnosis not present

## 2021-05-26 DIAGNOSIS — Z0000 Encounter for general adult medical examination without abnormal findings: Secondary | ICD-10-CM

## 2021-05-26 DIAGNOSIS — Z86718 Personal history of other venous thrombosis and embolism: Secondary | ICD-10-CM

## 2021-05-26 DIAGNOSIS — I10 Essential (primary) hypertension: Secondary | ICD-10-CM | POA: Diagnosis not present

## 2021-05-26 DIAGNOSIS — D582 Other hemoglobinopathies: Secondary | ICD-10-CM | POA: Diagnosis not present

## 2021-05-26 DIAGNOSIS — E291 Testicular hypofunction: Secondary | ICD-10-CM

## 2021-05-26 DIAGNOSIS — N401 Enlarged prostate with lower urinary tract symptoms: Secondary | ICD-10-CM

## 2021-05-26 LAB — BAYER DCA HB A1C WAIVED: HB A1C (BAYER DCA - WAIVED): 8.9 % — ABNORMAL HIGH (ref <7.0)

## 2021-05-26 LAB — MICROALBUMIN, URINE WAIVED
Creatinine, Urine Waived: 200 mg/dL (ref 10–300)
Microalb, Ur Waived: 30 mg/L — ABNORMAL HIGH (ref 0–19)
Microalb/Creat Ratio: <30 mg/g (ref <30)

## 2021-05-26 LAB — URINALYSIS, ROUTINE W REFLEX MICROSCOPIC
Bilirubin, UA: NEGATIVE
Ketones, UA: NEGATIVE
Leukocytes,UA: NEGATIVE
Nitrite, UA: NEGATIVE
Protein,UA: NEGATIVE
RBC, UA: NEGATIVE
Specific Gravity, UA: 1.025 (ref 1.005–1.030)
Urobilinogen, Ur: 0.2 mg/dL (ref 0.2–1.0)
pH, UA: 5 (ref 5.0–7.5)

## 2021-05-26 MED ORDER — DAPAGLIFLOZIN PROPANEDIOL 10 MG PO TABS: 10.0000 mg | ORAL_TABLET | Freq: Every day | ORAL | 6 refills | Status: DC

## 2021-05-26 MED ORDER — METFORMIN HCL ER 500 MG PO TB24: 1000.0000 mg | ORAL_TABLET | Freq: Two times a day (BID) | ORAL | 1 refills | Status: DC | PRN

## 2021-05-26 MED ORDER — ATORVASTATIN CALCIUM 20 MG PO TABS: 10.0000 mg | ORAL_TABLET | Freq: Every day | ORAL | 0 refills | Status: DC

## 2021-05-26 MED ORDER — BENAZEPRIL HCL 40 MG PO TABS: 40.0000 mg | ORAL_TABLET | Freq: Every day | ORAL | 0 refills | Status: DC

## 2021-05-26 MED ORDER — ALBUTEROL SULFATE HFA 108 (90 BASE) MCG/ACT IN AERS: 2.0000 | INHALATION_SPRAY | RESPIRATORY_TRACT | 0 refills | Status: DC | PRN

## 2021-05-26 NOTE — Assessment & Plan Note (Signed)
Under good control on current regimen. Continue current regimen. Continue to monitor. Call with any concerns. Refills given. Labs drawn today.   

## 2021-05-26 NOTE — Assessment & Plan Note (Signed)
Rechecking labs today. Await results. Treat as needed.  °

## 2021-05-26 NOTE — Assessment & Plan Note (Signed)
Not under good control with A1c of 8.9. Did not start his farxiga due to cost. Will recheck. Follow up 3 months. Call with any concerns.

## 2021-05-26 NOTE — Progress Notes (Signed)
BP 128/76   Pulse 76   Temp 97.6 F (36.4 C) (Oral)   Ht 5' 9.7" (1.77 m)   Wt 276 lb 12.8 oz (125.6 kg)   SpO2 97%   BMI 40.06 kg/m    Subjective:    Patient ID: Jeff MealingNed Allen Mcnamara Jr., male    DOB: 08-11-1951, 70 y.o.   MRN: [id number]  HPI: Jeff Mealinged Allen Menees Jr. is a 70 y.o. male presenting on 05/26/2021 for comprehensive medical examination. Current medical complaints include:  DIABETES Hypoglycemic episodes:no Polydipsia/polyuria: no Visual disturbance: no Chest pain: no Paresthesias: no Glucose Monitoring: yes  Accucheck frequency: occasionally Taking Insulin?: no Blood Pressure Monitoring: not checking Retinal Examination: Not up to Date Foot Exam:  Done today Diabetic Education: Completed Pneumovax: Up to Date Influenza: Up to Date Aspirin: no  HYPERTENSION / HYPERLIPIDEMIA Satisfied with current treatment? yes Duration of hypertension: chronic BP monitoring frequency: not checking BP medication side effects: no Duration of hyperlipidemia: chronic Cholesterol medication side effects: no Cholesterol supplements: none Past cholesterol medications: atorvastatin Medication compliance: excellent compliance Aspirin: no Recent stressors: no Recurrent headaches: no Visual changes: no Palpitations: no Dyspnea: no Chest pain: no Lower extremity edema: no Dizzy/lightheaded: no  LOW TESTOSTERONE Duration: chronic Status: stable  Satisfied with current treatment:  yes Medication side effects:  not on anything Decreased libido: no Fatigue: no Depressed mood: no Muscle weakness: no Erectile dysfunction: no  BPH BPH status: controlled Satisfied with current treatment?: yes Medication side effects: no Medication compliance: excellent compliance Duration: chronic Nocturia: no Urinary frequency:no Incomplete voiding: no Urgency: no Weak urinary stream: no Straining to start stream: no Dysuria: no Onset: gradual Severity: mild  Interim Problems from  his last visit: no  Depression Screen done today and results listed below:  [facility] 05/26/2021 11/24/2020 11/11/2020 05/10/2019 03/02/2018  Decreased Interest 0 0 0 0 0  Down, Depressed, Hopeless 0 0 0 0 0  PHQ - 2 Score 0 0 0 0 0    Past Medical History:  Past Medical History:  Diagnosis Date   Chronic deep vein thrombosis (DVT) (HCC)    Chronic low back pain    Chronic systolic heart failure (HCC)    a. TTE 2011 with EF 40-45% per notes   Diabetes mellitus without complication (HCC)    Erectile dysfunction    HLD (hyperlipidemia)    Hypertension    Hypogonadism in male    Permanent atrial fibrillation (HCC)    a. s/p DCCV 2011; b. redeveloped Afib in 2016; c. CHADS2VASc at least 5 (CHF, HTN, age x 1, DM, vascular disease); d. on Xarelto   Torn meniscus    right knee    Surgical History:  Past Surgical History:  Procedure Laterality Date   COLONOSCOPY WITH PROPOFOL N/A 05/28/2016   Procedure: COLONOSCOPY WITH PROPOFOL;  Surgeon: Midge Miniumarren Wohl, MD;  [facility];  Service: Endoscopy;  Laterality: N/A;  diabetic - oral meds   DVT, leg Left    INSERTION OF VENA CAVA FILTER  2014   KNEE ARTHROSCOPY WITH MEDIAL MENISECTOMY Right 05/31/2019   Procedure: KNEE ARTHROSCOPY WITH partial  MEDIAL MENISECTOMY;  Surgeon: Signa KellPatel, Sunny, MD;  [facility];  Service: Orthopedics;  Laterality: Right;   PERIPHERAL VASCULAR THROMBECTOMY Left 12/01/2017   Procedure: PERIPHERAL VASCULAR THROMBECTOMY;  Surgeon: Renford DillsSchnier, Gregory G, MD;  Location: [facility];  Service: Cardiovascular;  Laterality: Left;    Medications:  Current Outpatient Medications on File Prior to Visit  Medication Sig   amLODipine (NORVASC) 5 MG tablet TAKE 1 TABLET BY MOUTH EVERY DAY   fluticasone (FLONASE) 50 MCG/ACT nasal spray Place into both nostrils daily as needed for allergies or rhinitis.   furosemide (LASIX) 20 MG tablet TAKE 1 TABLET BY MOUTH EVERY DAY   metoprolol  tartrate (LOPRESSOR) 100 MG tablet TAKE 1 TABLET BY MOUTH TWICE A DAY   XARELTO 20 MG TABS tablet TAKE 1 TABLET BY MOUTH EVERY DAY   No current facility-administered medications on file prior to visit.    Allergies:  No Known Allergies  Social History:  Social History   Socioeconomic History   Marital status: Single    Spouse name: Not on file   Number of children: Not on file   Years of education: Not on file   Highest education level: Some college, no degree  Occupational History   Occupation: [employer]   Tobacco Use   Smoking status: Never   Smokeless tobacco: Never  Vaping Use   Vaping Use: Never used  Substance and Sexual Activity   Alcohol use: No    Alcohol/week: 0.0 standard drinks   Drug use: No   Sexual activity: Yes  Other Topics Concern   Not on file  Social History Narrative   Former Emergency planning/management officer    Independent at baseline. [employer]   Works at [employer]    Social Determinants of Corporate investment banker Strain: Low Risk    Difficulty of Paying Living Expenses: Not hard at all  Food Insecurity: No Food Insecurity   Worried About Programme researcher, broadcasting/film/video in the Last Year: Never true   Barista in the Last Year: Never true  Transportation Needs: No Transportation Needs   Lack of Transportation (Medical): No   Lack of Transportation (Non-Medical): No  Physical Activity: Inactive   Days of Exercise per Week: 0 days   Minutes of Exercise per Session: 0 min  Stress: No Stress Concern Present   Feeling of Stress : Not at all  Social Connections: Not on file  Intimate Partner Violence: Not on file   Social History   Tobacco Use  Smoking Status Never  Smokeless Tobacco Never   Social History   Substance and Sexual Activity  Alcohol Use No   Alcohol/week: 0.0 standard drinks    Family History:  Family History  Problem Relation Age of Onset   Lymphoma Mother    Heart failure Father    Diabetes Father    Atrial fibrillation  Brother    Diabetes Mellitus II Other    Heart attack Neg Hx    Hypertension Neg Hx    Cancer Neg Hx    COPD Neg Hx    Stroke Neg Hx    Prostate cancer Neg Hx    Kidney cancer Neg Hx    Bladder Cancer Neg Hx     Past medical history, surgical history, medications, allergies, family history and social history reviewed with patient today and changes made to appropriate areas of the chart.   Review of Systems  Constitutional: Negative.   HENT: Negative.    Eyes: Negative.   Respiratory: Negative.    Cardiovascular: Negative.   Gastrointestinal: Negative.   Genitourinary: Negative.   Musculoskeletal: Negative.   Skin: Negative.   Neurological: Negative.   Endo/Heme/Allergies: Negative.   Psychiatric/Behavioral: Negative.    All other ROS negative except what is listed above and in the HPI.  Objective:    BP 128/76   Pulse 76   Temp 97.6 F (36.4 C) (Oral)   Ht 5' 9.7" (1.77 m)   Wt 276 lb 12.8 oz (125.6 kg)   SpO2 97%   BMI 40.06 kg/m   Wt Readings from Last 3 Encounters:  05/26/21 276 lb 12.8 oz (125.6 kg)  04/06/21 260 lb (117.9 kg)  02/23/21 283 lb (128.4 kg)    Physical Exam Vitals and nursing note reviewed.  Constitutional:      General: He is not in acute distress.    Appearance: Normal appearance. He is obese. He is not ill-appearing, toxic-appearing or diaphoretic.  HENT:     Head: Normocephalic and atraumatic.     Right Ear: Tympanic membrane, ear canal and external ear normal. There is no impacted cerumen.     Left Ear: Tympanic membrane, ear canal and external ear normal. There is no impacted cerumen.     Nose: Nose normal. No congestion or rhinorrhea.     Mouth/Throat:     Mouth: Mucous membranes are moist.     Pharynx: Oropharynx is clear. No oropharyngeal exudate or posterior oropharyngeal erythema.  Eyes:     General: No scleral icterus.       Right eye: No discharge.        Left eye: No discharge.     Extraocular Movements: Extraocular  movements intact.     Conjunctiva/sclera: Conjunctivae normal.     Pupils: Pupils are equal, round, and reactive to light.  Neck:     Vascular: No carotid bruit.  Cardiovascular:     Rate and Rhythm: Normal rate and regular rhythm.     Pulses: Normal pulses.     Heart sounds: No murmur heard.   No friction rub. No gallop.  Pulmonary:     Effort: Pulmonary effort is normal. No respiratory distress.     Breath sounds: Normal breath sounds. No stridor. No wheezing, rhonchi or rales.  Chest:     Chest wall: No tenderness.  Abdominal:     General: Abdomen is flat. Bowel sounds are normal. There is no distension.     Palpations: Abdomen is soft. There is no mass.     Tenderness: There is no abdominal tenderness. There is no right CVA tenderness, left CVA tenderness, guarding or rebound.     Hernia: No hernia is present.  Genitourinary:    Comments: Genital exam deferred with shared decision making Musculoskeletal:        General: No swelling, tenderness, deformity or signs of injury.     Cervical back: Normal range of motion and neck supple. No rigidity. No muscular tenderness.     Right lower leg: No edema.     Left lower leg: No edema.  Lymphadenopathy:     Cervical: No cervical adenopathy.  Skin:    General: Skin is warm and dry.     Capillary Refill: Capillary refill takes less than 2 seconds.     Coloration: Skin is not jaundiced or pale.     Findings: No bruising, erythema, lesion or rash.  Neurological:     General: No focal deficit present.     Mental Status: He is alert and oriented to person, place, and time.     Cranial Nerves: No cranial nerve deficit.     Sensory: No sensory deficit.     Motor: No weakness.     Coordination: Coordination normal.     Gait: Gait normal.  Deep Tendon Reflexes: Reflexes normal.  Psychiatric:        Mood and Affect: Mood normal.        Behavior: Behavior normal.        Thought Content: Thought content normal.        Judgment:  Judgment normal.    Results for orders placed or performed in visit on 03/13/20  CBC with Differential/Platelet  Result Value Ref Range   WBC 6.2 3.4 - 10.8 x10E3/uL   RBC 5.12 4.14 - 5.80 x10E6/uL   Hemoglobin 14.8 13.0 - 17.7 g/dL   Hematocrit 16.0 73.7 - 51.0 %   MCV 87 79 - 97 fL   MCH 28.9 26.6 - 33.0 pg   MCHC 33.1 31.5 - 35.7 g/dL   RDW 10.6 26.9 - 48.5 %   Platelets 190 150 - 450 x10E3/uL   Neutrophils 72 Not Estab. %   Lymphs 15 Not Estab. %   Monocytes 10 Not Estab. %   Eos 1 Not Estab. %   Basos 1 Not Estab. %   Neutrophils Absolute 4.5 1.4 - 7.0 x10E3/uL   Lymphocytes Absolute 0.9 0.7 - 3.1 x10E3/uL   Monocytes Absolute 0.7 0.1 - 0.9 x10E3/uL   EOS (ABSOLUTE) 0.1 0.0 - 0.4 x10E3/uL   Basophils Absolute 0.0 0.0 - 0.2 x10E3/uL   Immature Granulocytes 1 Not Estab. %   Immature Grans (Abs) 0.1 0.0 - 0.1 x10E3/uL  Comprehensive metabolic panel  Result Value Ref Range   Glucose 213 (H) 65 - 99 mg/dL   BUN 26 8 - 27 mg/dL   Creatinine, Ser 4.62 (H) 0.76 - 1.27 mg/dL   GFR calc non Af Amer 55 (L) >59 mL/min/1.73   GFR calc Af Amer 63 >59 mL/min/1.73   BUN/Creatinine Ratio 20 10 - 24   Sodium 140 134 - 144 mmol/L   Potassium 4.3 3.5 - 5.2 mmol/L   Chloride 100 96 - 106 mmol/L   CO2 22 20 - 29 mmol/L   Calcium 8.8 8.6 - 10.2 mg/dL   Total Protein 6.4 6.0 - 8.5 g/dL   Albumin 4.7 3.8 - 4.8 g/dL   Globulin, Total 1.7 1.5 - 4.5 g/dL   Albumin/Globulin Ratio 2.8 (H) 1.2 - 2.2   Bilirubin Total 0.7 0.0 - 1.2 mg/dL   Alkaline Phosphatase 67 39 - 117 IU/L   AST 19 0 - 40 IU/L   ALT 18 0 - 44 IU/L  Lipid Panel w/o Chol/HDL Ratio  Result Value Ref Range   Cholesterol, Total 130 100 - 199 mg/dL   Triglycerides 703 (H) 0 - 149 mg/dL   HDL 30 (L) >50 mg/dL   VLDL Cholesterol Cal 29 5 - 40 mg/dL   LDL Chol Calc (NIH) 71 0 - 99 mg/dL  Bayer DCA Hb K9F Waived  Result Value Ref Range   HB A1C (BAYER DCA - WAIVED) 9.1 (H) <7.0 %      Assessment & Plan:   Problem List  Items Addressed This Visit       Cardiovascular and Mediastinum   HYPERTENSION, BENIGN    Under good control on current regimen. Continue current regimen. Continue to monitor. Call with any concerns. Refills given. Labs drawn today.         Relevant Medications   benazepril (LOTENSIN) 40 MG tablet   atorvastatin (LIPITOR) 20 MG tablet   Other Relevant Orders   CBC with Differential/Platelet   Comprehensive metabolic panel   Microalbumin, Urine Waived   TSH  Urinalysis, Routine w reflex microscopic     Endocrine   Diabetes mellitus (HCC)    Not under good control with A1c of 8.9. Did not start his farxiga due to cost. Will recheck. Follow up 3 months. Call with any concerns.        Relevant Medications   metFORMIN (GLUCOPHAGE-XR) 500 MG 24 hr tablet   benazepril (LOTENSIN) 40 MG tablet   atorvastatin (LIPITOR) 20 MG tablet   dapagliflozin propanediol (FARXIGA) 10 MG TABS tablet   Other Relevant Orders   Bayer DCA Hb A1c Waived   CBC with Differential/Platelet   Comprehensive metabolic panel   Urinalysis, Routine w reflex microscopic   Hypogonadism in male    Rechecking labs today. Await results. Treat as needed.        Relevant Orders   CBC with Differential/Platelet   Comprehensive metabolic panel   Urinalysis, Routine w reflex microscopic   Testosterone, free, total([facility])     Genitourinary   BPH (benign prostatic hyperplasia)    Under good control on current regimen. Continue current regimen. Continue to monitor. Call with any concerns. Refills given. Labs drawn today.        Relevant Orders   CBC with Differential/Platelet   Comprehensive metabolic panel   PSA   Urinalysis, Routine w reflex microscopic     Other   Mixed hyperlipidemia    Under good control on current regimen. Continue current regimen. Continue to monitor. Call with any concerns. Refills given. Labs drawn today.        Relevant Medications   benazepril (LOTENSIN) 40 MG  tablet   atorvastatin (LIPITOR) 20 MG tablet   Other Relevant Orders   CBC with Differential/Platelet   Comprehensive metabolic panel   Lipid Panel w/o Chol/HDL Ratio   Urinalysis, Routine w reflex microscopic   History of DVT (deep vein thrombosis)    Under good control on current regimen. Continue current regimen. Continue to monitor. Call with any concerns. Refills given. Labs drawn today.        Relevant Orders   CBC with Differential/Platelet   Comprehensive metabolic panel   Urinalysis, Routine w reflex microscopic   Morbid obesity (HCC)    Encouraged diet and exercise with goal of losing 1-2lbs per week. Call with any concerns.        Relevant Medications   metFORMIN (GLUCOPHAGE-XR) 500 MG 24 hr tablet   dapagliflozin propanediol (FARXIGA) 10 MG TABS tablet   Elevated hemoglobin (HCC)    Rechecking labs today. Await results. Treat as needed.        Relevant Orders   CBC with Differential/Platelet   Comprehensive metabolic panel   Urinalysis, Routine w reflex microscopic   Other Visit Diagnoses     Routine general medical examination at a health care facility    -  Primary   Vaccines up to date. Screening labs checked today. Colonoscopy up to date. Continue diet and exercise. Call with any concerns.         Discussed aspirin prophylaxis for myocardial infarction prevention and decision was made to continue ASA  LABORATORY TESTING:  Health maintenance labs ordered today as discussed above.   The natural history of prostate cancer and ongoing controversy regarding screening and potential treatment outcomes of prostate cancer has been discussed with the patient. The meaning of a false positive PSA and a false negative PSA has been discussed. He indicates understanding of the limitations of this screening test and wishes to proceed with screening PSA testing.  IMMUNIZATIONS:   - Tdap: Tetanus vaccination status reviewed: last tetanus booster within 10 years. -  Influenza: Up to date - Pneumovax: Up to date - Prevnar: Refused - COVID: Up to date - Shingrix vaccine: Given elsewhere  SCREENING: - Colonoscopy: Up to date  Discussed with patient purpose of the colonoscopy is to detect colon cancer at curable precancerous or early stages   PATIENT COUNSELING:    Sexuality: Discussed sexually transmitted diseases, partner selection, use of condoms, avoidance of unintended pregnancy  and contraceptive alternatives.   Advised to avoid cigarette smoking.  I discussed with the patient that most people either abstain from alcohol or drink within safe limits (<=14/week and <=4 drinks/occasion for males, <=7/weeks and <= 3 drinks/occasion for females) and that the risk for alcohol disorders and other health effects rises proportionally with the number of drinks per week and how often a drinker exceeds daily limits.  Discussed cessation/primary prevention of drug use and availability of treatment for abuse.   Diet: Encouraged to adjust caloric intake to maintain  or achieve ideal body weight, to reduce intake of dietary saturated fat and total fat, to limit sodium intake by avoiding high sodium foods and not adding table salt, and to maintain adequate dietary potassium and calcium preferably from fresh fruits, vegetables, and low-fat dairy products.    stressed the importance of regular exercise  Injury prevention: Discussed safety belts, safety helmets, smoke detector, smoking near bedding or upholstery.   Dental health: Discussed importance of regular tooth brushing, flossing, and dental visits.   Follow up plan: NEXT PREVENTATIVE PHYSICAL DUE IN 1 YEAR. Return in about 3 months (around 08/26/2021).

## 2021-05-26 NOTE — Assessment & Plan Note (Signed)
Encouraged diet and exercise with goal of losing 1-2lbs per week. Call with any concerns.  

## 2021-05-28 LAB — CBC WITH DIFFERENTIAL/PLATELET
Basophils Absolute: 0.1 10*3/uL (ref 0.0–0.2)
Basos: 1 %
EOS (ABSOLUTE): 0.1 10*3/uL (ref 0.0–0.4)
Eos: 1 %
Hematocrit: 51.1 % — ABNORMAL HIGH (ref 37.5–51.0)
Hemoglobin: 16.5 g/dL (ref 13.0–17.7)
Immature Grans (Abs): 0.1 10*3/uL (ref 0.0–0.1)
Immature Granulocytes: 1 %
Lymphocytes Absolute: 0.7 10*3/uL (ref 0.7–3.1)
Lymphs: 10 %
MCH: 29.5 pg (ref 26.6–33.0)
MCHC: 32.3 g/dL (ref 31.5–35.7)
MCV: 91 fL (ref 79–97)
Monocytes Absolute: 0.4 10*3/uL (ref 0.1–0.9)
Monocytes: 6 %
Neutrophils Absolute: 5.4 10*3/uL (ref 1.4–7.0)
Neutrophils: 81 %
Platelets: 173 10*3/uL (ref 150–450)
RBC: 5.59 x10E6/uL (ref 4.14–5.80)
RDW: 12.6 % (ref 11.6–15.4)
WBC: 6.7 10*3/uL (ref 3.4–10.8)

## 2021-05-28 LAB — COMPREHENSIVE METABOLIC PANEL
ALT: 13 IU/L (ref 0–44)
AST: 16 IU/L (ref 0–40)
Albumin/Globulin Ratio: 2.6 — ABNORMAL HIGH (ref 1.2–2.2)
Albumin: 4.6 g/dL (ref 3.8–4.8)
Alkaline Phosphatase: 78 IU/L (ref 44–121)
BUN/Creatinine Ratio: 19 (ref 10–24)
BUN: 26 mg/dL (ref 8–27)
Bilirubin Total: 0.8 mg/dL (ref 0.0–1.2)
CO2: 22 mmol/L (ref 20–29)
Calcium: 9.2 mg/dL (ref 8.6–10.2)
Chloride: 97 mmol/L (ref 96–106)
Creatinine, Ser: 1.35 mg/dL — ABNORMAL HIGH (ref 0.76–1.27)
Globulin, Total: 1.8 g/dL (ref 1.5–4.5)
Glucose: 267 mg/dL — ABNORMAL HIGH (ref 65–99)
Potassium: 5.4 mmol/L — ABNORMAL HIGH (ref 3.5–5.2)
Sodium: 135 mmol/L (ref 134–144)
Total Protein: 6.4 g/dL (ref 6.0–8.5)
eGFR: 56 mL/min/{1.73_m2} — ABNORMAL LOW (ref >59)

## 2021-05-28 LAB — TSH: TSH: 3.06 u[IU]/mL (ref 0.450–4.500)

## 2021-05-28 LAB — LIPID PANEL W/O CHOL/HDL RATIO
Cholesterol, Total: 124 mg/dL (ref 100–199)
HDL: 36 mg/dL — ABNORMAL LOW (ref >39)
LDL Chol Calc (NIH): 71 mg/dL (ref 0–99)
Triglycerides: 86 mg/dL (ref 0–149)
VLDL Cholesterol Cal: 17 mg/dL (ref 5–40)

## 2021-05-28 LAB — TESTOSTERONE, FREE, TOTAL, SHBG
Sex Hormone Binding: 28.7 nmol/L (ref 19.3–76.4)
Testosterone, Free: 7.6 pg/mL (ref 6.6–18.1)
Testosterone: 285 ng/dL (ref 264–916)

## 2021-05-28 LAB — PSA: Prostate Specific Ag, Serum: 2.1 ng/mL (ref 0.0–4.0)

## 2021-05-28 NOTE — Telephone Encounter (Signed)
Pt next apt on 08/31/2021

## 2021-06-19 ENCOUNTER — Other Ambulatory Visit: Payer: Self-pay | Admitting: Family Medicine

## 2021-06-22 DIAGNOSIS — H25813 Combined forms of age-related cataract, bilateral: Secondary | ICD-10-CM | POA: Diagnosis not present

## 2021-06-22 DIAGNOSIS — E113293 Type 2 diabetes mellitus with mild nonproliferative diabetic retinopathy without macular edema, bilateral: Secondary | ICD-10-CM | POA: Diagnosis not present

## 2021-06-22 LAB — HM DIABETES EYE EXAM

## 2021-06-29 ENCOUNTER — Encounter: Payer: Self-pay | Admitting: Nurse Practitioner

## 2021-06-29 ENCOUNTER — Ambulatory Visit (INDEPENDENT_AMBULATORY_CARE_PROVIDER_SITE_OTHER): Payer: Medicare HMO | Admitting: Nurse Practitioner

## 2021-06-29 ENCOUNTER — Other Ambulatory Visit: Payer: Self-pay

## 2021-06-29 VITALS — BP 109/77 | HR 66 | Temp 97.5°F | Ht 69.69 in | Wt 245.4 lb

## 2021-06-29 DIAGNOSIS — N1832 Chronic kidney disease, stage 3b: Secondary | ICD-10-CM | POA: Diagnosis not present

## 2021-06-29 DIAGNOSIS — I10 Essential (primary) hypertension: Secondary | ICD-10-CM | POA: Diagnosis not present

## 2021-06-29 DIAGNOSIS — R109 Unspecified abdominal pain: Secondary | ICD-10-CM | POA: Diagnosis not present

## 2021-06-29 DIAGNOSIS — I509 Heart failure, unspecified: Secondary | ICD-10-CM | POA: Insufficient documentation

## 2021-06-29 LAB — URINALYSIS, ROUTINE W REFLEX MICROSCOPIC
Bilirubin, UA: NEGATIVE
Glucose, UA: NEGATIVE
Ketones, UA: NEGATIVE
Nitrite, UA: NEGATIVE
Protein,UA: NEGATIVE
Specific Gravity, UA: 1.015 (ref 1.005–1.030)
Urobilinogen, Ur: 0.2 mg/dL (ref 0.2–1.0)
pH, UA: 5 (ref 5.0–7.5)

## 2021-06-29 LAB — MICROSCOPIC EXAMINATION
Bacteria, UA: NONE SEEN
Epithelial Cells (non renal): NONE SEEN /hpf (ref 0–10)
RBC, Urine: NONE SEEN /hpf (ref 0–2)

## 2021-06-29 NOTE — Patient Instructions (Addendum)
Stop amlodipine (norvasc) Keep checking blood pressure every day

## 2021-06-29 NOTE — Assessment & Plan Note (Signed)
Acute, sudden onset with no trauma. Differentials include musculoskeletal, kidney stones, and UTI. Will check CMP, CBC, and U/A. U/A negative for blood, shows leukocytes and trace blood. Will send urine for culture. With tenderness with palpation, most likely musculoskeletal. Continue tylenol prn along with using ice and/or heat. Follow up if symptoms worsen or don't improve.

## 2021-06-29 NOTE — Assessment & Plan Note (Signed)
Blood pressure has been running in the low 100s systolic at home. Today in the office his blood pressure is 109/77. Since his last visit, he has lost 25 pounds through intermittent fasting. Will have him stop his amlodipine and continue monitoring his blood pressure at home. Keep appointment with PCP in 2 months or sooner with concerns.

## 2021-06-29 NOTE — Progress Notes (Signed)
Acute Office Visit  Subjective:    Patient ID: Jeff [name]., male    DOB: 1951/08/28, 70 y.o.   MRN: [id number]  Chief Complaint  Patient presents with   Back Pain    Right lower back pain since last night.    HPI Patient is in today for right lower back/flank pain that started last night.   BACK PAIN Duration: days Mechanism of injury: no trauma Location: Right and low back Onset: sudden Severity: 7/10 Quality: sharp and aching Frequency: constant Radiation: none Aggravating factors: lifting, movement, walking, and bending Alleviating factors: APAP Status: stable Treatments attempted: APAP  Relief with NSAIDs?: No NSAIDs Taken Nighttime pain:  no Paresthesias / decreased sensation:  no Bowel / bladder incontinence:  no Fevers:  no Dysuria / urinary frequency:  no  Past Medical History:  Diagnosis Date   Chronic deep vein thrombosis (DVT) (HCC)    Chronic low back pain    Chronic systolic heart failure ([place])    a. TTE 2011 with EF 40-45% per notes   Diabetes mellitus without complication [facility])    Erectile dysfunction    HLD (hyperlipidemia)    Hypertension    Hypogonadism in male    Permanent atrial fibrillation ([place])    a. s/p DCCV 2011; b. redeveloped Afib in 2016; c. CHADS2VASc at least 5 (CHF, HTN, age x 1, DM, vascular disease); d. on Xarelto   Torn meniscus    right knee    Past Surgical History:  Procedure Laterality Date   COLONOSCOPY WITH PROPOFOL N/A 05/28/2016   Procedure: COLONOSCOPY WITH PROPOFOL;  Surgeon: Lucilla Lame, MD;  Location: [place];  Service: Endoscopy;  Laterality: N/A;  diabetic - oral meds   DVT, leg Left    INSERTION OF VENA CAVA FILTER  2014   KNEE ARTHROSCOPY WITH MEDIAL MENISECTOMY Right 05/31/2019   Procedure: KNEE ARTHROSCOPY WITH partial  MEDIAL MENISECTOMY;  Surgeon: Leim Fabry, MD;  Location: [place];  Service: Orthopedics;  Laterality: Right;   PERIPHERAL VASCULAR THROMBECTOMY Left  12/01/2017   Procedure: PERIPHERAL VASCULAR THROMBECTOMY;  Surgeon: Katha Cabal, MD;  Location: [place] [facility];  Service: Cardiovascular;  Laterality: Left;    Family History  Problem Relation Age of Onset   Lymphoma Mother    Heart failure Father    Diabetes Father    Atrial fibrillation Brother    Diabetes Mellitus II Other    Heart attack Neg Hx    Hypertension Neg Hx    Cancer Neg Hx    COPD Neg Hx    Stroke Neg Hx    Prostate cancer Neg Hx    Kidney cancer Neg Hx    Bladder Cancer Neg Hx     Social History   Socioeconomic History   Marital status: Single    Spouse name: Not on file   Number of children: Not on file   Years of education: Not on file   Highest education level: Some college, no degree  Occupational History   Occupation: [employer]   Tobacco Use   Smoking status: Never   Smokeless tobacco: Never  Vaping Use   Vaping Use: Never used  Substance and Sexual Activity   Alcohol use: No    Alcohol/week: 0.0 standard drinks   Drug use: No   Sexual activity: Yes  Other Topics Concern   Not on file  Social History Narrative   Former Engineer, structural    Independent at baseline. [employer]  [employer]   Works at [employer] Strain: Low Risk    Difficulty of Paying Living Expenses: Not hard at all  Food Insecurity: No Food Insecurity   Worried About Charity fundraiser in the Last Year: Never true   Arboriculturist in the Last Year: Never true  Transportation Needs: No Transportation Needs   Lack of Transportation (Medical): No   Lack of Transportation (Non-Medical): No  Physical Activity: Inactive   Days of Exercise per Week: 0 days   Minutes of Exercise per Session: 0 min  Stress: No Stress Concern Present   Feeling of Stress : Not at all  Social Connections: Not on file  Intimate Partner Violence: Not on file    Outpatient Medications Prior to Visit  Medication Sig Dispense Refill    albuterol (VENTOLIN HFA) 108 (90 Base) MCG/ACT inhaler Inhale 2 puffs into the lungs every 4 (four) hours as needed for wheezing or shortness of breath. 8 g 0   amLODipine (NORVASC) 5 MG tablet TAKE 1 TABLET BY MOUTH EVERY DAY 90 tablet 3   atorvastatin (LIPITOR) 20 MG tablet Take 0.5 tablets (10 mg total) by mouth daily. 45 tablet 0   benazepril (LOTENSIN) 40 MG tablet TAKE 1 TABLET (40 MG TOTAL) BY MOUTH DAILY. PLEASE CALL TO MAKE AN APPOINTMENT FOR MORE REFILLS 90 tablet 0   dapagliflozin propanediol (FARXIGA) 10 MG TABS tablet Take 1 tablet (10 mg total) by mouth daily before breakfast. 30 tablet 6   fluticasone (FLONASE) 50 MCG/ACT nasal spray Place into both nostrils daily as needed for allergies or rhinitis.     furosemide (LASIX) 20 MG tablet TAKE 1 TABLET BY MOUTH EVERY DAY 90 tablet 3   metFORMIN (GLUCOPHAGE-XR) 500 MG 24 hr tablet Take 2 tablets (1,000 mg total) by mouth 2 (two) times daily as needed. 360 tablet 1   metoprolol tartrate (LOPRESSOR) 100 MG tablet TAKE 1 TABLET BY MOUTH TWICE A DAY 180 tablet 1   XARELTO 20 MG TABS tablet TAKE 1 TABLET BY MOUTH EVERY DAY 90 tablet 1   doxazosin (CARDURA) 2 MG tablet Take by mouth.     No facility-administered medications prior to visit.    No Known Allergies  Review of Systems  Constitutional: Negative.   Respiratory: Negative.    Cardiovascular: Negative.   Gastrointestinal: Negative.   Genitourinary: Negative.   Musculoskeletal:  Positive for back pain.  Skin: Negative.   Neurological: Negative.       Objective:    Physical Exam Vitals and nursing note reviewed.  Constitutional:      Appearance: Normal appearance.  HENT:     Head: Normocephalic.  Eyes:     Conjunctiva/sclera: Conjunctivae normal.  Cardiovascular:     Rate and Rhythm: Normal rate and regular rhythm.     Pulses: Normal pulses.     Heart sounds: Normal heart sounds.  Pulmonary:     Effort: Pulmonary effort is normal.     Breath sounds: Normal breath  sounds.  Abdominal:     Palpations: Abdomen is soft.     Tenderness: There is no abdominal tenderness.  Musculoskeletal:        General: Tenderness (right flank) present. No swelling. Normal range of motion.     Cervical back: Normal range of motion.  Skin:    General: Skin is warm.  Neurological:     General: No focal deficit present.  Mental Status: He is alert and oriented to person, place, and time.  Psychiatric:        Mood and Affect: Mood normal.        Behavior: Behavior normal.        Thought Content: Thought content normal.        Judgment: Judgment normal.    BP 109/77   Pulse 66   Temp (!) 97.5 F (36.4 C) (Oral)   Ht 5' 9.69" (1.77 m)   Wt 245 lb 6 oz (111.3 kg)   SpO2 100%   BMI 35.53 kg/m  Wt Readings from Last 3 Encounters:  06/29/21 245 lb 6 oz (111.3 kg)  05/26/21 276 lb 12.8 oz (125.6 kg)  04/06/21 260 lb (117.9 kg)    Health Maintenance Due  Topic Date Due   OPHTHALMOLOGY EXAM  01/27/2016   INFLUENZA VACCINE  06/08/2021    There are no preventive care reminders to display for this patient.   Lab Results  Component Value Date   TSH 3.060 05/26/2021   Lab Results  Component Value Date   WBC 6.7 05/26/2021   HGB 16.5 05/26/2021   HCT 51.1 (H) 05/26/2021   MCV 91 05/26/2021   PLT 173 05/26/2021   Lab Results  Component Value Date   NA 135 05/26/2021   K 5.4 (H) 05/26/2021   CO2 22 05/26/2021   GLUCOSE 267 (H) 05/26/2021   BUN 26 05/26/2021   CREATININE 1.35 (H) 05/26/2021   BILITOT 0.8 05/26/2021   ALKPHOS 78 05/26/2021   AST 16 05/26/2021   ALT 13 05/26/2021   PROT 6.4 05/26/2021   ALBUMIN 4.6 05/26/2021   CALCIUM 9.2 05/26/2021   ANIONGAP 10 10/07/2019   EGFR 56 (L) 05/26/2021   Lab Results  Component Value Date   CHOL 124 05/26/2021   Lab Results  Component Value Date   HDL 36 (L) 05/26/2021   Lab Results  Component Value Date   LDLCALC 71 05/26/2021   Lab Results  Component Value Date   TRIG 86 05/26/2021    Lab Results  Component Value Date   CHOLHDL 4.0 10/06/2019   Lab Results  Component Value Date   HGBA1C 8.9 (H) 05/26/2021       Assessment & Plan:   Problem List Items Addressed This Visit   None    No orders of the defined types were placed in this encounter.    Charyl Dancer, NP

## 2021-06-30 DIAGNOSIS — M545 Low back pain, unspecified: Secondary | ICD-10-CM | POA: Diagnosis not present

## 2021-06-30 LAB — COMPREHENSIVE METABOLIC PANEL
ALT: 13 IU/L (ref 0–44)
AST: 19 IU/L (ref 0–40)
Albumin/Globulin Ratio: 3.2 — ABNORMAL HIGH (ref 1.2–2.2)
Albumin: 4.5 g/dL (ref 3.8–4.8)
Alkaline Phosphatase: 51 IU/L (ref 44–121)
BUN/Creatinine Ratio: 19 (ref 10–24)
BUN: 33 mg/dL — ABNORMAL HIGH (ref 8–27)
Bilirubin Total: 0.5 mg/dL (ref 0.0–1.2)
CO2: 22 mmol/L (ref 20–29)
Calcium: 8.7 mg/dL (ref 8.6–10.2)
Chloride: 97 mmol/L (ref 96–106)
Creatinine, Ser: 1.75 mg/dL — ABNORMAL HIGH (ref 0.76–1.27)
Globulin, Total: 1.4 g/dL — ABNORMAL LOW (ref 1.5–4.5)
Glucose: 134 mg/dL — ABNORMAL HIGH (ref 65–99)
Potassium: 5.7 mmol/L — ABNORMAL HIGH (ref 3.5–5.2)
Sodium: 136 mmol/L (ref 134–144)
Total Protein: 5.9 g/dL — ABNORMAL LOW (ref 6.0–8.5)
eGFR: 41 mL/min/{1.73_m2} — ABNORMAL LOW (ref >59)

## 2021-06-30 LAB — CBC WITH DIFFERENTIAL/PLATELET
Basophils Absolute: 0 10*3/uL (ref 0.0–0.2)
Basos: 1 %
EOS (ABSOLUTE): 0.1 10*3/uL (ref 0.0–0.4)
Eos: 1 %
Hematocrit: 45.6 % (ref 37.5–51.0)
Hemoglobin: 15.6 g/dL (ref 13.0–17.7)
Immature Grans (Abs): 0 10*3/uL (ref 0.0–0.1)
Immature Granulocytes: 0 %
Lymphocytes Absolute: 1 10*3/uL (ref 0.7–3.1)
Lymphs: 20 %
MCH: 30.2 pg (ref 26.6–33.0)
MCHC: 34.2 g/dL (ref 31.5–35.7)
MCV: 88 fL (ref 79–97)
Monocytes Absolute: 0.4 10*3/uL (ref 0.1–0.9)
Monocytes: 9 %
Neutrophils Absolute: 3.5 10*3/uL (ref 1.4–7.0)
Neutrophils: 69 %
Platelets: 133 10*3/uL — ABNORMAL LOW (ref 150–450)
RBC: 5.16 x10E6/uL (ref 4.14–5.80)
RDW: 13.1 % (ref 11.6–15.4)
WBC: 5.1 10*3/uL (ref 3.4–10.8)

## 2021-06-30 NOTE — Addendum Note (Signed)
Addended by: Rodman Pickle A on: 06/30/2021 08:28 AM   Modules accepted: Orders

## 2021-07-01 LAB — URINE CULTURE

## 2021-07-06 ENCOUNTER — Encounter: Payer: Self-pay | Admitting: Family Medicine

## 2021-07-06 ENCOUNTER — Ambulatory Visit (INDEPENDENT_AMBULATORY_CARE_PROVIDER_SITE_OTHER): Payer: Medicare HMO | Admitting: Family Medicine

## 2021-07-06 ENCOUNTER — Other Ambulatory Visit: Payer: Medicare HMO

## 2021-07-06 ENCOUNTER — Other Ambulatory Visit: Payer: Self-pay

## 2021-07-06 VITALS — BP 94/73 | HR 74 | Temp 97.1°F | Ht 69.69 in | Wt 235.8 lb

## 2021-07-06 DIAGNOSIS — N1832 Chronic kidney disease, stage 3b: Secondary | ICD-10-CM

## 2021-07-06 DIAGNOSIS — M545 Low back pain, unspecified: Secondary | ICD-10-CM

## 2021-07-06 DIAGNOSIS — I959 Hypotension, unspecified: Secondary | ICD-10-CM | POA: Diagnosis not present

## 2021-07-06 MED ORDER — TIZANIDINE HCL 4 MG PO TABS: 4.0000 mg | ORAL_TABLET | Freq: Four times a day (QID) | ORAL | 1 refills | Status: DC | PRN

## 2021-07-06 MED ORDER — KETOROLAC TROMETHAMINE 60 MG/2ML IM SOLN
60.0000 mg | Freq: Once | INTRAMUSCULAR | Status: AC
  Administered 2021-07-06: 60 mg via INTRAMUSCULAR

## 2021-07-06 MED ORDER — NAPROXEN 500 MG PO TABS: 500.0000 mg | ORAL_TABLET | Freq: Two times a day (BID) | ORAL | 1 refills | Status: DC

## 2021-07-06 NOTE — Progress Notes (Signed)
BP 94/73   Pulse 74   Temp (!) 97.1 F (36.2 C) (Oral)   Ht 5' 9.69" (1.77 m)   Wt 235 lb 12.8 oz (107 kg)   SpO2 97%   BMI 34.14 kg/m    Subjective:    Patient ID: Jeff Lips., male    DOB: 01/03/1951, 70 y.o.   MRN: [id number]  HPI: Jeff [place]. is a 70 y.o. male  Chief Complaint  Patient presents with   Back Pain    Has seen [facility]   BACK PAIN Duration: about 8 days Mechanism of injury: unknown Location: Right and low back Onset: sudden Severity: severe Quality: sharp and shooting Frequency: constant Radiation: none Aggrevating factors: moving Alleviating factors: hydrocodone Status: stable Treatments attempted:  muscle relaxer, hydrocodone, tramadol, rest, ice, heat, APAP, ibuprofen, and aleve  Relief with NSAIDs?: no Nighttime pain:  yes Paresthesias / decreased sensation:  no Bowel / bladder incontinence:  no Fevers:  no Dysuria / urinary frequency:  no   Relevant past medical, surgical, family and social history reviewed and updated as indicated. Interim medical history since our last visit reviewed. Allergies and medications reviewed and updated.  Review of Systems  Constitutional: Negative.   Respiratory: Negative.    Cardiovascular: Negative.   Gastrointestinal: Negative.   Musculoskeletal:  Positive for back pain and myalgias. Negative for arthralgias, gait problem, joint swelling, neck pain and neck stiffness.  Skin: Negative.   Neurological: Negative.   Psychiatric/Behavioral: Negative.     Per HPI unless specifically indicated above     Objective:    BP 94/73   Pulse 74   Temp (!) 97.1 F (36.2 C) (Oral)   Ht 5' 9.69" (1.77 m)   Wt 235 lb 12.8 oz (107 kg)   SpO2 97%   BMI 34.14 kg/m   Wt Readings from Last 3 Encounters:  07/06/21 235 lb 12.8 oz (107 kg)  06/29/21 245 lb 6 oz (111.3 kg)  05/26/21 276 lb 12.8 oz (125.6 kg)    Physical Exam Vitals and nursing note reviewed.  Constitutional:      General: He  is in acute distress.     Appearance: Normal appearance. He is not ill-appearing, toxic-appearing or diaphoretic.  HENT:     Head: Normocephalic and atraumatic.     Right Ear: External ear normal.     Left Ear: External ear normal.     Nose: Nose normal.     Mouth/Throat:     Mouth: Mucous membranes are moist.     Pharynx: Oropharynx is clear.  Eyes:     General: No scleral icterus.       Right eye: No discharge.        Left eye: No discharge.     Extraocular Movements: Extraocular movements intact.     Conjunctiva/sclera: Conjunctivae normal.     Pupils: Pupils are equal, round, and reactive to light.  Cardiovascular:     Rate and Rhythm: Normal rate and regular rhythm.     Pulses: Normal pulses.     Heart sounds: Normal heart sounds. No murmur heard.   No friction rub. No gallop.  Pulmonary:     Effort: Pulmonary effort is normal. No respiratory distress.     Breath sounds: Normal breath sounds. No stridor. No wheezing, rhonchi or rales.  Chest:     Chest wall: No tenderness.  Musculoskeletal:        General: Normal range of motion.  Cervical back: Normal range of motion and neck supple.  Skin:    General: Skin is warm and dry.     Capillary Refill: Capillary refill takes less than 2 seconds.     Coloration: Skin is not jaundiced or pale.     Findings: No bruising, erythema, lesion or rash.  Neurological:     General: No focal deficit present.     Mental Status: He is alert and oriented to person, place, and time. Mental status is at baseline.  Psychiatric:        Mood and Affect: Mood normal.        Behavior: Behavior normal.        Thought Content: Thought content normal.        Judgment: Judgment normal.    Results for orders placed or performed in visit on 06/29/21  Microscopic Examination   Urine  Result Value Ref Range   WBC, UA 0-5 0 - 5 /hpf   RBC None seen 0 - 2 /hpf   Epithelial Cells (non renal) None seen 0 - 10 /hpf   Mucus, UA Present (A) Not  Estab.   Bacteria, UA None seen None seen/Few  Urine Culture   Specimen: Urine   UR  Result Value Ref Range   Urine Culture, Routine Final report    Organism ID, Bacteria Comment   Comp Met (CMET)  Result Value Ref Range   Glucose 134 (H) 65 - 99 mg/dL   BUN 33 (H) 8 - 27 mg/dL   Creatinine, Ser 1.75 (H) 0.76 - 1.27 mg/dL   eGFR 41 (L) >59 mL/min/1.73   BUN/Creatinine Ratio 19 10 - 24   Sodium 136 134 - 144 mmol/L   Potassium 5.7 (H) 3.5 - 5.2 mmol/L   Chloride 97 96 - 106 mmol/L   CO2 22 20 - 29 mmol/L   Calcium 8.7 8.6 - 10.2 mg/dL   Total Protein 5.9 (L) 6.0 - 8.5 g/dL   Albumin 4.5 3.8 - 4.8 g/dL   Globulin, Total 1.4 (L) 1.5 - 4.5 g/dL   Albumin/Globulin Ratio 3.2 (H) 1.2 - 2.2   Bilirubin Total 0.5 0.0 - 1.2 mg/dL   Alkaline Phosphatase 51 44 - 121 IU/L   AST 19 0 - 40 IU/L   ALT 13 0 - 44 IU/L  CBC with Differential  Result Value Ref Range   WBC 5.1 3.4 - 10.8 x10E3/uL   RBC 5.16 4.14 - 5.80 x10E6/uL   Hemoglobin 15.6 13.0 - 17.7 g/dL   Hematocrit 45.6 37.5 - 51.0 %   MCV 88 79 - 97 fL   MCH 30.2 26.6 - 33.0 pg   MCHC 34.2 31.5 - 35.7 g/dL   RDW 13.1 11.6 - 15.4 %   Platelets 133 (L) 150 - 450 x10E3/uL   Neutrophils 69 Not Estab. %   Lymphs 20 Not Estab. %   Monocytes 9 Not Estab. %   Eos 1 Not Estab. %   Basos 1 Not Estab. %   Neutrophils Absolute 3.5 1.4 - 7.0 x10E3/uL   Lymphocytes Absolute 1.0 0.7 - 3.1 x10E3/uL   Monocytes Absolute 0.4 0.1 - 0.9 x10E3/uL   EOS (ABSOLUTE) 0.1 0.0 - 0.4 x10E3/uL   Basophils Absolute 0.0 0.0 - 0.2 x10E3/uL   Immature Granulocytes 0 Not Estab. %   Immature Grans (Abs) 0.0 0.0 - 0.1 x10E3/uL  Urinalysis, Routine w reflex microscopic  Result Value Ref Range   Specific Gravity, UA 1.015 1.005 - 1.030  pH, UA 5.0 5.0 - 7.5   Color, UA Yellow Yellow   Appearance Ur Clear Clear   Leukocytes,UA 1+ (A) Negative   Protein,UA Negative Negative/Trace   Glucose, UA Negative Negative   Ketones, UA Negative Negative   RBC, UA  Trace (A) Negative   Bilirubin, UA Negative Negative   Urobilinogen, Ur 0.2 0.2 - 1.0 mg/dL   Nitrite, UA Negative Negative   Microscopic Examination See below:       Assessment & Plan:   Problem List Items Addressed This Visit   None Visit Diagnoses     Acute right-sided low back pain without sciatica    -  Primary   In acute exacerbation. Will get into PT and treat with toradol sot today. Naproxen and tizanidine. Continue hydrocodone. Recheck Friday.   Relevant Medications   HYDROcodone-acetaminophen (NORCO/VICODIN) 5-325 MG tablet   methocarbamol (ROBAXIN) 500 MG tablet   traMADol (ULTRAM) 50 MG tablet   ketorolac (TORADOL) injection 60 mg (Completed)   naproxen (NAPROSYN) 500 MG tablet   tiZANidine (ZANAFLEX) 4 MG tablet   Other Relevant Orders   Ambulatory referral to Physical Therapy   Hypotension, unspecified hypotension type       Better with glass of water, given weight loss, will likely need BP meds dropped.         Follow up plan: Return Friday.

## 2021-07-06 NOTE — Patient Instructions (Addendum)
Call [facility]- give them a couple of hours to get the referral. [phone]

## 2021-07-10 ENCOUNTER — Other Ambulatory Visit: Payer: Self-pay

## 2021-07-10 ENCOUNTER — Encounter: Payer: Self-pay | Admitting: Family Medicine

## 2021-07-10 ENCOUNTER — Ambulatory Visit (INDEPENDENT_AMBULATORY_CARE_PROVIDER_SITE_OTHER): Payer: Medicare HMO | Admitting: Family Medicine

## 2021-07-10 VITALS — BP 116/81 | HR 71 | Temp 97.9°F | Ht 69.69 in | Wt 237.5 lb

## 2021-07-10 DIAGNOSIS — M545 Low back pain, unspecified: Secondary | ICD-10-CM

## 2021-07-10 MED ORDER — TRAMADOL HCL 50 MG PO TABS: 50.0000 mg | ORAL_TABLET | Freq: Four times a day (QID) | ORAL | 0 refills | Status: AC

## 2021-07-10 MED ORDER — KETOROLAC TROMETHAMINE 60 MG/2ML IM SOLN
60.0000 mg | Freq: Once | INTRAMUSCULAR | Status: AC
  Administered 2021-07-10: 60 mg via INTRAMUSCULAR

## 2021-07-10 NOTE — Progress Notes (Signed)
BP 116/81   Pulse 71   Temp 97.9 F (36.6 C) (Oral)   Ht 5' 9.69" (1.77 m)   Wt 237 lb 8 oz (107.7 kg)   SpO2 99%   BMI 34.39 kg/m    Subjective:    Patient ID: Jeff Lips., male    DOB: Nov 13, 1950, 70 y.o.   MRN: [id number]  HPI: Jeff [place]. is a 70 y.o. male  Chief Complaint  Patient presents with   Back Pain    Patient states that back pain is still bothering him.    BACK PAIN Duration: 12 days Mechanism of injury: unknown Location: R low back Onset: sudden Severity: severe Quality: sharp and shooting Frequency: constant Radiation: none Aggravating factors: moving Alleviating factors: toradol, hydrocodone, naoroxen, tizanidine Status: better Treatments attempted:  tramadol, hydrocodone, muscle relaxer, rest, ice, heat, APAP, ibuprofen, aleve, and HEP  Relief with NSAIDs?: significant Nighttime pain:  yes Paresthesias / decreased sensation:  no Bowel / bladder incontinence:  no Fevers:  no Dysuria / urinary frequency:  no  Relevant past medical, surgical, family and social history reviewed and updated as indicated. Interim medical history since our last visit reviewed. Allergies and medications reviewed and updated.  Review of Systems  Constitutional: Negative.   Respiratory: Negative.    Cardiovascular: Negative.   Gastrointestinal: Negative.   Musculoskeletal:  Positive for back pain and myalgias. Negative for arthralgias, gait problem, joint swelling, neck pain and neck stiffness.  Skin: Negative.   Neurological: Negative.   Psychiatric/Behavioral: Negative.     Per HPI unless specifically indicated above     Objective:    BP 116/81   Pulse 71   Temp 97.9 F (36.6 C) (Oral)   Ht 5' 9.69" (1.77 m)   Wt 237 lb 8 oz (107.7 kg)   SpO2 99%   BMI 34.39 kg/m   Wt Readings from Last 3 Encounters:  07/10/21 237 lb 8 oz (107.7 kg)  07/06/21 235 lb 12.8 oz (107 kg)  06/29/21 245 lb 6 oz (111.3 kg)    Physical Exam Vitals and  nursing note reviewed.  Constitutional:      General: He is not in acute distress.    Appearance: Normal appearance. He is not ill-appearing, toxic-appearing or diaphoretic.  HENT:     Head: Normocephalic and atraumatic.     Right Ear: External ear normal.     Left Ear: External ear normal.     Nose: Nose normal.     Mouth/Throat:     Mouth: Mucous membranes are moist.     Pharynx: Oropharynx is clear.  Eyes:     General: No scleral icterus.       Right eye: No discharge.        Left eye: No discharge.     Extraocular Movements: Extraocular movements intact.     Conjunctiva/sclera: Conjunctivae normal.     Pupils: Pupils are equal, round, and reactive to light.  Cardiovascular:     Rate and Rhythm: Normal rate and regular rhythm.     Pulses: Normal pulses.     Heart sounds: Normal heart sounds. No murmur heard.   No friction rub. No gallop.  Pulmonary:     Effort: Pulmonary effort is normal. No respiratory distress.     Breath sounds: Normal breath sounds. No stridor. No wheezing, rhonchi or rales.  Chest:     Chest wall: No tenderness.  Musculoskeletal:        General: Normal range  of motion.     Cervical back: Normal range of motion and neck supple.  Skin:    General: Skin is warm and dry.     Capillary Refill: Capillary refill takes less than 2 seconds.     Coloration: Skin is not jaundiced or pale.     Findings: No bruising, erythema, lesion or rash.  Neurological:     General: No focal deficit present.     Mental Status: He is alert and oriented to person, place, and time. Mental status is at baseline.  Psychiatric:        Mood and Affect: Mood normal.        Behavior: Behavior normal.        Thought Content: Thought content normal.        Judgment: Judgment normal.    Results for orders placed or performed in visit on 06/29/21  Microscopic Examination   Urine  Result Value Ref Range   WBC, UA 0-5 0 - 5 /hpf   RBC None seen 0 - 2 /hpf   Epithelial Cells (non  renal) None seen 0 - 10 /hpf   Mucus, UA Present (A) Not Estab.   Bacteria, UA None seen None seen/Few  Urine Culture   Specimen: Urine   UR  Result Value Ref Range   Urine Culture, Routine Final report    Organism ID, Bacteria Comment   Comp Met (CMET)  Result Value Ref Range   Glucose 134 (H) 65 - 99 mg/dL   BUN 33 (H) 8 - 27 mg/dL   Creatinine, Ser 1.75 (H) 0.76 - 1.27 mg/dL   eGFR 41 (L) >59 mL/min/1.73   BUN/Creatinine Ratio 19 10 - 24   Sodium 136 134 - 144 mmol/L   Potassium 5.7 (H) 3.5 - 5.2 mmol/L   Chloride 97 96 - 106 mmol/L   CO2 22 20 - 29 mmol/L   Calcium 8.7 8.6 - 10.2 mg/dL   Total Protein 5.9 (L) 6.0 - 8.5 g/dL   Albumin 4.5 3.8 - 4.8 g/dL   Globulin, Total 1.4 (L) 1.5 - 4.5 g/dL   Albumin/Globulin Ratio 3.2 (H) 1.2 - 2.2   Bilirubin Total 0.5 0.0 - 1.2 mg/dL   Alkaline Phosphatase 51 44 - 121 IU/L   AST 19 0 - 40 IU/L   ALT 13 0 - 44 IU/L  CBC with Differential  Result Value Ref Range   WBC 5.1 3.4 - 10.8 x10E3/uL   RBC 5.16 4.14 - 5.80 x10E6/uL   Hemoglobin 15.6 13.0 - 17.7 g/dL   Hematocrit 45.6 37.5 - 51.0 %   MCV 88 79 - 97 fL   MCH 30.2 26.6 - 33.0 pg   MCHC 34.2 31.5 - 35.7 g/dL   RDW 13.1 11.6 - 15.4 %   Platelets 133 (L) 150 - 450 x10E3/uL   Neutrophils 69 Not Estab. %   Lymphs 20 Not Estab. %   Monocytes 9 Not Estab. %   Eos 1 Not Estab. %   Basos 1 Not Estab. %   Neutrophils Absolute 3.5 1.4 - 7.0 x10E3/uL   Lymphocytes Absolute 1.0 0.7 - 3.1 x10E3/uL   Monocytes Absolute 0.4 0.1 - 0.9 x10E3/uL   EOS (ABSOLUTE) 0.1 0.0 - 0.4 x10E3/uL   Basophils Absolute 0.0 0.0 - 0.2 x10E3/uL   Immature Granulocytes 0 Not Estab. %   Immature Grans (Abs) 0.0 0.0 - 0.1 x10E3/uL  Urinalysis, Routine w reflex microscopic  Result Value Ref Range   Specific Gravity, UA  1.015 1.005 - 1.030   pH, UA 5.0 5.0 - 7.5   Color, UA Yellow Yellow   Appearance Ur Clear Clear   Leukocytes,UA 1+ (A) Negative   Protein,UA Negative Negative/Trace   Glucose, UA  Negative Negative   Ketones, UA Negative Negative   RBC, UA Trace (A) Negative   Bilirubin, UA Negative Negative   Urobilinogen, Ur 0.2 0.2 - 1.0 mg/dL   Nitrite, UA Negative Negative   Microscopic Examination See below:       Assessment & Plan:   Problem List Items Addressed This Visit   None Visit Diagnoses     Acute right-sided low back pain without sciatica    -  Primary   Toradol given again today. Continue current regimen. Will see if we can get him into PT sooner. Continue to monitor.    Relevant Medications   ketorolac (TORADOL) injection 60 mg (Completed)   traMADol (ULTRAM) 50 MG tablet        Follow up plan: Return in about 2 weeks (around 07/24/2021).

## 2021-07-15 ENCOUNTER — Ambulatory Visit: Payer: Self-pay | Admitting: *Deleted

## 2021-07-15 NOTE — Telephone Encounter (Signed)
OK to double book at 4:20 tomorrow

## 2021-07-15 NOTE — Telephone Encounter (Signed)
Pt. Reports his amlodipine was stopped 2 weeks ago. Last night felt dizzy and BP 67/58. Came back up to 138/78. Concerned he needs medication adjusted. Wants to see Dr. Laural Benes only. Spoke with Sunday Spillers and will send triage over for review.

## 2021-07-15 NOTE — Telephone Encounter (Signed)
Routing to provider to review and advise

## 2021-07-15 NOTE — Telephone Encounter (Signed)
Reason for Disposition  [1] Systolic BP 90-110 AND [2] taking blood pressure medications AND [3] dizzy, lightheaded or weak  Answer Assessment - Initial Assessment Questions 1. BLOOD PRESSURE: "What is the blood pressure?" "Did you take at least two measurements 5 minutes apart?"     Last  67/58   then 138/78   Last night 2. ONSET: "When did you take your blood pressure?"     The weekend  3. HOW: "How did you obtain the blood pressure?" (e.g., visiting nurse, automatic home BP monitor)     Home cuff 4. HISTORY: "Do you have a history of low blood pressure?" "What is your blood pressure normally?"     Yes 5. MEDICATIONS: "Are you taking any medications for blood pressure?" If Yes, ask: "Have they been changed recently?"     Yes 6. PULSE RATE: "Do you know what your pulse rate is?"      68-72 7. OTHER SYMPTOMS: "Have you been sick recently?" "Have you had a recent injury?"     No 8. PREGNANCY: "Is there any chance you are pregnant?" "When was your last menstrual period?"     N/a  Protocols used: Blood Pressure - Low-A-AH

## 2021-07-16 ENCOUNTER — Other Ambulatory Visit: Payer: Self-pay

## 2021-07-16 ENCOUNTER — Encounter: Payer: Self-pay | Admitting: Family Medicine

## 2021-07-16 ENCOUNTER — Ambulatory Visit (INDEPENDENT_AMBULATORY_CARE_PROVIDER_SITE_OTHER): Payer: Medicare HMO | Admitting: Family Medicine

## 2021-07-16 VITALS — BP 134/80 | HR 78 | Temp 97.9°F | Ht 69.69 in | Wt 232.8 lb

## 2021-07-16 DIAGNOSIS — I959 Hypotension, unspecified: Secondary | ICD-10-CM

## 2021-07-16 MED ORDER — BENAZEPRIL HCL 20 MG PO TABS: 20.0000 mg | ORAL_TABLET | Freq: Every day | ORAL | 2 refills | Status: DC

## 2021-07-16 NOTE — Progress Notes (Signed)
BP 134/80   Pulse 78   Temp 97.9 F (36.6 C) (Oral)   Ht 5' 9.69" (1.77 m)   Wt 232 lb 12.8 oz (105.6 kg)   SpO2 98%   BMI 33.71 kg/m    Subjective:    Patient ID: Jeff Lips., male    DOB: Mar 25, 1951, 70 y.o.   MRN: [id number]  HPI: Jeff Highfill. is a 70 y.o. male  Chief Complaint  Patient presents with   Dizziness    Patient said that he has been feeling swimmy headed all day at work,   Hypertension   HYPERTENSION Hypertension status: overtreated  Satisfied with current treatment? no Duration of hypertension: chronic BP monitoring frequency:  a few times a week BP range: yesterday 60s/50s, this AM was 104/64 BP medication side effects:  no Medication compliance: excellent compliance Previous BP meds:amlodipine, benazepril, lasix, cardura Aspirin: no Recurrent headaches: no Visual changes: no Palpitations: no Dyspnea: no Chest pain: no Lower extremity edema: no Dizzy/lightheaded: yes  Relevant past medical, surgical, family and social history reviewed and updated as indicated. Interim medical history since our last visit reviewed. Allergies and medications reviewed and updated.  Review of Systems  Constitutional:  Positive for diaphoresis and fatigue. Negative for activity change, appetite change, chills, fever and unexpected weight change.  HENT: Negative.    Respiratory: Negative.    Cardiovascular: Negative.   Gastrointestinal: Negative.   Musculoskeletal: Negative.   Neurological:  Positive for dizziness and light-headedness. Negative for tremors, seizures, syncope, facial asymmetry, speech difficulty, weakness, numbness and headaches.  Psychiatric/Behavioral: Negative.     Per HPI unless specifically indicated above     Objective:    BP 134/80   Pulse 78   Temp 97.9 F (36.6 C) (Oral)   Ht 5' 9.69" (1.77 m)   Wt 232 lb 12.8 oz (105.6 kg)   SpO2 98%   BMI 33.71 kg/m   Wt Readings from Last 3 Encounters:  07/16/21 232 lb 12.8 oz  (105.6 kg)  07/10/21 237 lb 8 oz (107.7 kg)  07/06/21 235 lb 12.8 oz (107 kg)    Physical Exam Vitals and nursing note reviewed.  Constitutional:      General: He is not in acute distress.    Appearance: Normal appearance. He is not ill-appearing, toxic-appearing or diaphoretic.  HENT:     Head: Normocephalic and atraumatic.     Right Ear: External ear normal.     Left Ear: External ear normal.     Nose: Nose normal.     Mouth/Throat:     Mouth: Mucous membranes are moist.     Pharynx: Oropharynx is clear.  Eyes:     General: No scleral icterus.       Right eye: No discharge.        Left eye: No discharge.     Extraocular Movements: Extraocular movements intact.     Conjunctiva/sclera: Conjunctivae normal.     Pupils: Pupils are equal, round, and reactive to light.  Cardiovascular:     Rate and Rhythm: Normal rate. Rhythm irregular.     Pulses: Normal pulses.     Heart sounds: Normal heart sounds. No murmur heard.   No friction rub. No gallop.  Pulmonary:     Effort: Pulmonary effort is normal. No respiratory distress.     Breath sounds: Normal breath sounds. No stridor. No wheezing, rhonchi or rales.  Chest:     Chest wall: No tenderness.  Musculoskeletal:  General: Normal range of motion.     Cervical back: Normal range of motion and neck supple.  Skin:    General: Skin is warm and dry.     Capillary Refill: Capillary refill takes less than 2 seconds.     Coloration: Skin is not jaundiced or pale.     Findings: No bruising, erythema, lesion or rash.  Neurological:     General: No focal deficit present.     Mental Status: He is alert and oriented to person, place, and time. Mental status is at baseline.  Psychiatric:        Mood and Affect: Mood normal.        Behavior: Behavior normal.        Thought Content: Thought content normal.        Judgment: Judgment normal.    Results for orders placed or performed in visit on 06/29/21  Microscopic Examination    Urine  Result Value Ref Range   WBC, UA 0-5 0 - 5 /hpf   RBC None seen 0 - 2 /hpf   Epithelial Cells (non renal) None seen 0 - 10 /hpf   Mucus, UA Present (A) Not Estab.   Bacteria, UA None seen None seen/Few  Urine Culture   Specimen: Urine   UR  Result Value Ref Range   Urine Culture, Routine Final report    Organism ID, Bacteria Comment   Comp Met (CMET)  Result Value Ref Range   Glucose 134 (H) 65 - 99 mg/dL   BUN 33 (H) 8 - 27 mg/dL   Creatinine, Ser 1.75 (H) 0.76 - 1.27 mg/dL   eGFR 41 (L) >59 mL/min/1.73   BUN/Creatinine Ratio 19 10 - 24   Sodium 136 134 - 144 mmol/L   Potassium 5.7 (H) 3.5 - 5.2 mmol/L   Chloride 97 96 - 106 mmol/L   CO2 22 20 - 29 mmol/L   Calcium 8.7 8.6 - 10.2 mg/dL   Total Protein 5.9 (L) 6.0 - 8.5 g/dL   Albumin 4.5 3.8 - 4.8 g/dL   Globulin, Total 1.4 (L) 1.5 - 4.5 g/dL   Albumin/Globulin Ratio 3.2 (H) 1.2 - 2.2   Bilirubin Total 0.5 0.0 - 1.2 mg/dL   Alkaline Phosphatase 51 44 - 121 IU/L   AST 19 0 - 40 IU/L   ALT 13 0 - 44 IU/L  CBC with Differential  Result Value Ref Range   WBC 5.1 3.4 - 10.8 x10E3/uL   RBC 5.16 4.14 - 5.80 x10E6/uL   Hemoglobin 15.6 13.0 - 17.7 g/dL   Hematocrit 45.6 37.5 - 51.0 %   MCV 88 79 - 97 fL   MCH 30.2 26.6 - 33.0 pg   MCHC 34.2 31.5 - 35.7 g/dL   RDW 13.1 11.6 - 15.4 %   Platelets 133 (L) 150 - 450 x10E3/uL   Neutrophils 69 Not Estab. %   Lymphs 20 Not Estab. %   Monocytes 9 Not Estab. %   Eos 1 Not Estab. %   Basos 1 Not Estab. %   Neutrophils Absolute 3.5 1.4 - 7.0 x10E3/uL   Lymphocytes Absolute 1.0 0.7 - 3.1 x10E3/uL   Monocytes Absolute 0.4 0.1 - 0.9 x10E3/uL   EOS (ABSOLUTE) 0.1 0.0 - 0.4 x10E3/uL   Basophils Absolute 0.0 0.0 - 0.2 x10E3/uL   Immature Granulocytes 0 Not Estab. %   Immature Grans (Abs) 0.0 0.0 - 0.1 x10E3/uL  Urinalysis, Routine w reflex microscopic  Result Value Ref Range  Specific Gravity, UA 1.015 1.005 - 1.030   pH, UA 5.0 5.0 - 7.5   Color, UA Yellow Yellow    Appearance Ur Clear Clear   Leukocytes,UA 1+ (A) Negative   Protein,UA Negative Negative/Trace   Glucose, UA Negative Negative   Ketones, UA Negative Negative   RBC, UA Trace (A) Negative   Bilirubin, UA Negative Negative   Urobilinogen, Ur 0.2 0.2 - 1.0 mg/dL   Nitrite, UA Negative Negative   Microscopic Examination See below:       Assessment & Plan:   Problem List Items Addressed This Visit   None Visit Diagnoses     Hypotension, unspecified hypotension type    -  Primary   Will cut his benazepril to 50m daily with hold criteria. Checking BMP. Await results. EKG shows no change from prior. Recheck 2 weeks.    Relevant Medications   benazepril (LOTENSIN) 20 MG tablet   Other Relevant Orders   Basic metabolic panel   EKG [id number](Completed)        Follow up plan: Return in about 2 weeks (around 07/30/2021).

## 2021-07-16 NOTE — Patient Instructions (Signed)
If your blood pressure in the AM is less than 110/70- DO NOT TAKE YOUR BENAZEPRIL If it's more than 110/70- take 20mg  only

## 2021-07-17 LAB — BASIC METABOLIC PANEL
BUN/Creatinine Ratio: 19 (ref 10–24)
BUN: 34 mg/dL — ABNORMAL HIGH (ref 8–27)
CO2: 22 mmol/L (ref 20–29)
Calcium: 9.2 mg/dL (ref 8.6–10.2)
Chloride: 92 mmol/L — ABNORMAL LOW (ref 96–106)
Creatinine, Ser: 1.79 mg/dL — ABNORMAL HIGH (ref 0.76–1.27)
Glucose: 108 mg/dL — ABNORMAL HIGH (ref 65–99)
Potassium: 4.7 mmol/L (ref 3.5–5.2)
Sodium: 136 mmol/L (ref 134–144)
eGFR: 40 mL/min/{1.73_m2} — ABNORMAL LOW (ref >59)

## 2021-07-17 NOTE — Progress Notes (Signed)
Interpreted by me on 07/16/21. A. Fib at 76bpm, No significant changes from EKG in April 2022

## 2021-07-28 ENCOUNTER — Other Ambulatory Visit: Payer: Self-pay | Admitting: Family Medicine

## 2021-07-28 NOTE — Telephone Encounter (Signed)
Requested medication (s) are due for refill today: no  Requested medication (s) are on the active medication list: yes  Last refill:  06/16/21  Future visit scheduled: yes  Notes to clinic:  med not delegated to NT to RF   Requested Prescriptions  Pending Prescriptions Disp Refills   tiZANidine (ZANAFLEX) 4 MG tablet [Pharmacy Med Name: TIZANIDINE HCL 4 MG TABLET] 120 tablet 1    Sig: TAKE 1 TABLET BY MOUTH EVERY 6 HOURS AS NEEDED FOR MUSCLE SPASMS.     Not Delegated - Cardiovascular:  Alpha-2 Agonists - tizanidine Failed - 07/28/2021  1:35 PM      Failed - This refill cannot be delegated      Passed - Valid encounter within last 6 months    Recent Outpatient Visits           1 week ago Hypotension, unspecified hypotension type   [facility], Megan P, DO   2 weeks ago Acute right-sided low back pain without sciatica   [facility] [place], Megan P, DO   3 weeks ago Acute right-sided low back pain without sciatica   [facility] [place], Megan P, DO   4 weeks ago Flank pain   [facility] [place], Lauren A, NP   2 months ago Routine general medical examination at a health care facility   [facility] [place], Oralia Rud, DO       Future Appointments             In 6 days Dorcas Carrow, DO [facility]   In 3 weeks Deirdre Evener, MD [facility]   In 1 month Nelson, Oralia Rud, DO [facility]   In 4 months  [facility], [facility]

## 2021-08-03 ENCOUNTER — Encounter: Payer: Self-pay | Admitting: Family Medicine

## 2021-08-03 ENCOUNTER — Other Ambulatory Visit: Payer: Self-pay

## 2021-08-03 ENCOUNTER — Ambulatory Visit (INDEPENDENT_AMBULATORY_CARE_PROVIDER_SITE_OTHER): Payer: Medicare HMO | Admitting: Family Medicine

## 2021-08-03 VITALS — BP 128/72 | HR 61 | Ht 69.0 in | Wt 234.0 lb

## 2021-08-03 DIAGNOSIS — I959 Hypotension, unspecified: Secondary | ICD-10-CM

## 2021-08-03 NOTE — Progress Notes (Signed)
BP 128/72   Pulse 61   Ht _0  (1.753 m)   Wt 234 lb (106.1 kg)   BMI 34.56 kg/m    Subjective:    Patient ID: Jeff Lips., male    DOB: 05-Jun-1951, 70 y.o.   MRN: [id number]  HPI: Jeff Defina. is a 70 y.o. male  Chief Complaint  Patient presents with   Hypotension   HYPERTENSION Hypertension status: better  Satisfied with current treatment? yes Duration of hypertension: chronic BP monitoring frequency:  a few times a day BP range: 97/67 in the AM then goes up around 11 BP medication side effects:  no Medication compliance: excellent compliance Aspirin: no Recurrent headaches: no Visual changes: no Palpitations: no Dyspnea: no Chest pain: no Lower extremity edema: no Dizzy/lightheaded: no  Relevant past medical, surgical, family and social history reviewed and updated as indicated. Interim medical history since our last visit reviewed. Allergies and medications reviewed and updated.  Review of Systems  Constitutional: Negative.   Respiratory: Negative.    Cardiovascular: Negative.   Gastrointestinal: Negative.   Musculoskeletal: Negative.   Neurological: Negative.   Psychiatric/Behavioral: Negative.     Per HPI unless specifically indicated above     Objective:    BP 128/72   Pulse 61   Ht _1  (1.753 m)   Wt 234 lb (106.1 kg)   BMI 34.56 kg/m   Wt Readings from Last 3 Encounters:  08/03/21 234 lb (106.1 kg)  07/16/21 232 lb 12.8 oz (105.6 kg)  07/10/21 237 lb 8 oz (107.7 kg)    Physical Exam Vitals and nursing note reviewed.  Constitutional:      General: He is not in acute distress.    Appearance: Normal appearance. He is not ill-appearing, toxic-appearing or diaphoretic.  HENT:     Head: Normocephalic and atraumatic.     Right Ear: External ear normal.     Left Ear: External ear normal.     Nose: Nose normal.     Mouth/Throat:     Mouth: Mucous membranes are moist.     Pharynx: Oropharynx is clear.  Eyes:     General:  No scleral icterus.       Right eye: No discharge.        Left eye: No discharge.     Extraocular Movements: Extraocular movements intact.     Conjunctiva/sclera: Conjunctivae normal.     Pupils: Pupils are equal, round, and reactive to light.  Cardiovascular:     Rate and Rhythm: Normal rate and regular rhythm.     Pulses: Normal pulses.     Heart sounds: Normal heart sounds. No murmur heard.   No friction rub. No gallop.  Pulmonary:     Effort: Pulmonary effort is normal. No respiratory distress.     Breath sounds: Normal breath sounds. No stridor. No wheezing, rhonchi or rales.  Chest:     Chest wall: No tenderness.  Musculoskeletal:        General: Normal range of motion.     Cervical back: Normal range of motion and neck supple.  Skin:    General: Skin is warm and dry.     Capillary Refill: Capillary refill takes less than 2 seconds.     Coloration: Skin is not jaundiced or pale.     Findings: No bruising, erythema, lesion or rash.  Neurological:     General: No focal deficit present.     Mental Status: He is alert  and oriented to person, place, and time. Mental status is at baseline.  Psychiatric:        Mood and Affect: Mood normal.        Behavior: Behavior normal.        Thought Content: Thought content normal.        Judgment: Judgment normal.    Results for orders placed or performed in visit on 17/71/16  Basic metabolic panel  Result Value Ref Range   Glucose 108 (H) 65 - 99 mg/dL   BUN 34 (H) 8 - 27 mg/dL   Creatinine, Ser 1.79 (H) 0.76 - 1.27 mg/dL   eGFR 40 (L) >59 mL/min/1.73   BUN/Creatinine Ratio 19 10 - 24   Sodium 136 134 - 144 mmol/L   Potassium 4.7 3.5 - 5.2 mmol/L   Chloride 92 (L) 96 - 106 mmol/L   CO2 22 20 - 29 mmol/L   Calcium 9.2 8.6 - 10.2 mg/dL      Assessment & Plan:   Problem List Items Addressed This Visit   None Visit Diagnoses     Hypotension, unspecified hypotension type    -  Primary   Improved significantly. Continue  current regimen. Continue to monitor. Call with any concerns.         Follow up plan: Return as scheduled.

## 2021-08-07 ENCOUNTER — Other Ambulatory Visit: Payer: Self-pay | Admitting: Cardiovascular Disease

## 2021-08-07 NOTE — Telephone Encounter (Signed)
Prescription refill request for Xarelto received.  Indication: afib, DVT Last office visit: [place], 02/23/2021 Weight: 106.1 kg Age: 70 yo  Scr:  1.79, 07/16/2021 CrCl: 57 ml/min   Refill sent.

## 2021-08-07 NOTE — Telephone Encounter (Signed)
Refill request

## 2021-08-17 ENCOUNTER — Other Ambulatory Visit: Payer: Self-pay

## 2021-08-17 MED ORDER — RIVAROXABAN 20 MG PO TABS: 20.0000 mg | ORAL_TABLET | Freq: Every day | ORAL | 1 refills | Status: DC

## 2021-08-17 NOTE — Telephone Encounter (Signed)
Prescription refill request for Xarelto received.  Indication:Afib  Last office visit: 02/23/21 Mariah Milling)  Weight: 106.1kg Age: 70 Scr: 1.79 (07/16/21) CrCl: 57.36ml/min  Appropriate dose and refill sent to requested pharmacy.

## 2021-08-19 ENCOUNTER — Inpatient Hospital Stay
Admission: EM | Admit: 2021-08-19 | Discharge: 2021-08-22 | DRG: 603 | Disposition: A | Discharge status: Home / Self Care | Payer: Medicare HMO | Attending: Internal Medicine | Admitting: Internal Medicine

## 2021-08-19 ENCOUNTER — Other Ambulatory Visit: Payer: Self-pay | Admitting: Family Medicine

## 2021-08-19 ENCOUNTER — Ambulatory Visit: Payer: Medicare HMO | Admitting: Dermatology

## 2021-08-19 ENCOUNTER — Other Ambulatory Visit: Payer: Self-pay

## 2021-08-19 ENCOUNTER — Ambulatory Visit: Payer: Self-pay | Admitting: *Deleted

## 2021-08-19 ENCOUNTER — Emergency Department: Payer: Medicare HMO

## 2021-08-19 DIAGNOSIS — L8990 Pressure ulcer of unspecified site, unspecified stage: Secondary | ICD-10-CM | POA: Insufficient documentation

## 2021-08-19 DIAGNOSIS — I130 Hypertensive heart and chronic kidney disease with heart failure and stage 1 through stage 4 chronic kidney disease, or unspecified chronic kidney disease: Secondary | ICD-10-CM | POA: Diagnosis present

## 2021-08-19 DIAGNOSIS — M7989 Other specified soft tissue disorders: Secondary | ICD-10-CM

## 2021-08-19 DIAGNOSIS — I10 Essential (primary) hypertension: Secondary | ICD-10-CM | POA: Diagnosis present

## 2021-08-19 DIAGNOSIS — R238 Other skin changes: Secondary | ICD-10-CM

## 2021-08-19 DIAGNOSIS — I82532 Chronic embolism and thrombosis of left popliteal vein: Secondary | ICD-10-CM | POA: Diagnosis not present

## 2021-08-19 DIAGNOSIS — Z8249 Family history of ischemic heart disease and other diseases of the circulatory system: Secondary | ICD-10-CM

## 2021-08-19 DIAGNOSIS — I82512 Chronic embolism and thrombosis of left femoral vein: Secondary | ICD-10-CM | POA: Diagnosis not present

## 2021-08-19 DIAGNOSIS — N1831 Chronic kidney disease, stage 3a: Secondary | ICD-10-CM | POA: Diagnosis present

## 2021-08-19 DIAGNOSIS — I87009 Postthrombotic syndrome without complications of unspecified extremity: Secondary | ICD-10-CM | POA: Diagnosis not present

## 2021-08-19 DIAGNOSIS — E119 Type 2 diabetes mellitus without complications: Secondary | ICD-10-CM

## 2021-08-19 DIAGNOSIS — R600 Localized edema: Secondary | ICD-10-CM

## 2021-08-19 DIAGNOSIS — Z833 Family history of diabetes mellitus: Secondary | ICD-10-CM

## 2021-08-19 DIAGNOSIS — E1165 Type 2 diabetes mellitus with hyperglycemia: Secondary | ICD-10-CM | POA: Diagnosis not present

## 2021-08-19 DIAGNOSIS — Z79899 Other long term (current) drug therapy: Secondary | ICD-10-CM

## 2021-08-19 DIAGNOSIS — Z807 Family history of other malignant neoplasms of lymphoid, hematopoietic and related tissues: Secondary | ICD-10-CM

## 2021-08-19 DIAGNOSIS — I781 Nevus, non-neoplastic: Secondary | ICD-10-CM | POA: Diagnosis not present

## 2021-08-19 DIAGNOSIS — I5022 Chronic systolic (congestive) heart failure: Secondary | ICD-10-CM | POA: Diagnosis present

## 2021-08-19 DIAGNOSIS — Z20822 Contact with and (suspected) exposure to covid-19: Secondary | ICD-10-CM | POA: Diagnosis not present

## 2021-08-19 DIAGNOSIS — I129 Hypertensive chronic kidney disease with stage 1 through stage 4 chronic kidney disease, or unspecified chronic kidney disease: Secondary | ICD-10-CM | POA: Diagnosis present

## 2021-08-19 DIAGNOSIS — E782 Mixed hyperlipidemia: Secondary | ICD-10-CM

## 2021-08-19 DIAGNOSIS — E785 Hyperlipidemia, unspecified: Secondary | ICD-10-CM | POA: Diagnosis present

## 2021-08-19 DIAGNOSIS — I482 Chronic atrial fibrillation, unspecified: Secondary | ICD-10-CM | POA: Diagnosis present

## 2021-08-19 DIAGNOSIS — N400 Enlarged prostate without lower urinary tract symptoms: Secondary | ICD-10-CM | POA: Diagnosis present

## 2021-08-19 DIAGNOSIS — E1122 Type 2 diabetes mellitus with diabetic chronic kidney disease: Secondary | ICD-10-CM | POA: Diagnosis not present

## 2021-08-19 DIAGNOSIS — I509 Heart failure, unspecified: Secondary | ICD-10-CM | POA: Diagnosis not present

## 2021-08-19 DIAGNOSIS — I82412 Acute embolism and thrombosis of left femoral vein: Secondary | ICD-10-CM | POA: Diagnosis not present

## 2021-08-19 DIAGNOSIS — I420 Dilated cardiomyopathy: Secondary | ICD-10-CM | POA: Diagnosis present

## 2021-08-19 DIAGNOSIS — I878 Other specified disorders of veins: Secondary | ICD-10-CM | POA: Diagnosis not present

## 2021-08-19 DIAGNOSIS — E876 Hypokalemia: Secondary | ICD-10-CM | POA: Diagnosis not present

## 2021-08-19 DIAGNOSIS — Z7901 Long term (current) use of anticoagulants: Secondary | ICD-10-CM

## 2021-08-19 DIAGNOSIS — Z86718 Personal history of other venous thrombosis and embolism: Secondary | ICD-10-CM | POA: Diagnosis not present

## 2021-08-19 DIAGNOSIS — L570 Actinic keratosis: Secondary | ICD-10-CM | POA: Diagnosis not present

## 2021-08-19 DIAGNOSIS — G8929 Other chronic pain: Secondary | ICD-10-CM | POA: Diagnosis present

## 2021-08-19 DIAGNOSIS — M79605 Pain in left leg: Secondary | ICD-10-CM

## 2021-08-19 DIAGNOSIS — E1140 Type 2 diabetes mellitus with diabetic neuropathy, unspecified: Secondary | ICD-10-CM

## 2021-08-19 DIAGNOSIS — Z95820 Peripheral vascular angioplasty status with implants and grafts: Secondary | ICD-10-CM | POA: Diagnosis not present

## 2021-08-19 DIAGNOSIS — I4821 Permanent atrial fibrillation: Secondary | ICD-10-CM | POA: Diagnosis present

## 2021-08-19 DIAGNOSIS — L820 Inflamed seborrheic keratosis: Secondary | ICD-10-CM | POA: Diagnosis not present

## 2021-08-19 DIAGNOSIS — Z7984 Long term (current) use of oral hypoglycemic drugs: Secondary | ICD-10-CM | POA: Diagnosis not present

## 2021-08-19 DIAGNOSIS — L821 Other seborrheic keratosis: Secondary | ICD-10-CM | POA: Diagnosis not present

## 2021-08-19 DIAGNOSIS — L03116 Cellulitis of left lower limb: Secondary | ICD-10-CM | POA: Diagnosis not present

## 2021-08-19 DIAGNOSIS — M545 Low back pain, unspecified: Secondary | ICD-10-CM | POA: Diagnosis present

## 2021-08-19 DIAGNOSIS — R739 Hyperglycemia, unspecified: Secondary | ICD-10-CM | POA: Diagnosis not present

## 2021-08-19 DIAGNOSIS — N401 Enlarged prostate with lower urinary tract symptoms: Secondary | ICD-10-CM

## 2021-08-19 DIAGNOSIS — I824Y2 Acute embolism and thrombosis of unspecified deep veins of left proximal lower extremity: Secondary | ICD-10-CM | POA: Diagnosis not present

## 2021-08-19 DIAGNOSIS — L578 Other skin changes due to chronic exposure to nonionizing radiation: Secondary | ICD-10-CM

## 2021-08-19 DIAGNOSIS — I82402 Acute embolism and thrombosis of unspecified deep veins of left lower extremity: Secondary | ICD-10-CM | POA: Diagnosis present

## 2021-08-19 DIAGNOSIS — L814 Other melanin hyperpigmentation: Secondary | ICD-10-CM

## 2021-08-19 DIAGNOSIS — E1159 Type 2 diabetes mellitus with other circulatory complications: Secondary | ICD-10-CM | POA: Diagnosis not present

## 2021-08-19 LAB — CBC WITH DIFFERENTIAL/PLATELET
Abs Immature Granulocytes: 0.08 10*3/uL — ABNORMAL HIGH (ref 0.00–0.07)
Basophils Absolute: 0 10*3/uL (ref 0.0–0.1)
Basophils Relative: 0 %
Eosinophils Absolute: 0.1 10*3/uL (ref 0.0–0.5)
Eosinophils Relative: 1 %
HCT: 38.6 % — ABNORMAL LOW (ref 39.0–52.0)
Hemoglobin: 12.7 g/dL — ABNORMAL LOW (ref 13.0–17.0)
Immature Granulocytes: 1 %
Lymphocytes Relative: 9 %
Lymphs Abs: 0.8 10*3/uL (ref 0.7–4.0)
MCH: 29.5 pg (ref 26.0–34.0)
MCHC: 32.9 g/dL (ref 30.0–36.0)
MCV: 89.8 fL (ref 80.0–100.0)
Monocytes Absolute: 0.8 10*3/uL (ref 0.1–1.0)
Monocytes Relative: 8 %
Neutro Abs: 7.3 10*3/uL (ref 1.7–7.7)
Neutrophils Relative %: 81 %
Platelets: 195 10*3/uL (ref 150–400)
RBC: 4.3 MIL/uL (ref 4.22–5.81)
RDW: 15.3 % (ref 11.5–15.5)
WBC: 9 10*3/uL (ref 4.0–10.5)
nRBC: 0 % (ref 0.0–0.2)

## 2021-08-19 LAB — BASIC METABOLIC PANEL
Anion gap: 15 (ref 5–15)
BUN: 23 mg/dL (ref 8–23)
CO2: 25 mmol/L (ref 22–32)
Calcium: 8.8 mg/dL — ABNORMAL LOW (ref 8.9–10.3)
Chloride: 92 mmol/L — ABNORMAL LOW (ref 98–111)
Creatinine, Ser: 1.19 mg/dL (ref 0.61–1.24)
GFR, Estimated: >60 mL/min (ref >60)
Glucose, Bld: 129 mg/dL — ABNORMAL HIGH (ref 70–99)
Potassium: 3.3 mmol/L — ABNORMAL LOW (ref 3.5–5.1)
Sodium: 132 mmol/L — ABNORMAL LOW (ref 135–145)

## 2021-08-19 LAB — LACTIC ACID, PLASMA
Lactic Acid, Venous: 1.2 mmol/L (ref 0.5–1.9)
Lactic Acid, Venous: 1.3 mmol/L (ref 0.5–1.9)

## 2021-08-19 LAB — PROTIME-INR
INR: 1.8 — ABNORMAL HIGH (ref 0.8–1.2)
Prothrombin Time: 21.2 seconds — ABNORMAL HIGH (ref 11.4–15.2)

## 2021-08-19 LAB — RESP PANEL BY RT-PCR (FLU A&B, COVID) ARPGX2
Influenza A by PCR: NEGATIVE
Influenza B by PCR: NEGATIVE
SARS Coronavirus 2 by RT PCR: NEGATIVE

## 2021-08-19 LAB — APTT: aPTT: 46 seconds — ABNORMAL HIGH (ref 24–36)

## 2021-08-19 LAB — HEPARIN LEVEL (UNFRACTIONATED): Heparin Unfractionated: >1.1 IU/mL — ABNORMAL HIGH (ref 0.30–0.70)

## 2021-08-19 MED ORDER — HEPARIN SODIUM (PORCINE) 5000 UNIT/ML IJ SOLN
4000.0000 [IU] | Freq: Once | INTRAMUSCULAR | Status: DC
  Filled 2021-08-19: qty 1

## 2021-08-19 MED ORDER — METOPROLOL TARTRATE 50 MG PO TABS
100.0000 mg | ORAL_TABLET | Freq: Two times a day (BID) | ORAL | Status: DC
  Administered 2021-08-20 – 2021-08-22 (×6): 100 mg via ORAL
  Filled 2021-08-19 (×6): qty 2

## 2021-08-19 MED ORDER — ACETAMINOPHEN 500 MG PO TABS
1000.0000 mg | ORAL_TABLET | Freq: Once | ORAL | Status: AC
  Administered 2021-08-19: 1000 mg via ORAL
  Filled 2021-08-19: qty 2

## 2021-08-19 MED ORDER — SODIUM CHLORIDE 0.9 % IV SOLN: INTRAVENOUS | Status: AC

## 2021-08-19 MED ORDER — BENAZEPRIL HCL 20 MG PO TABS
20.0000 mg | ORAL_TABLET | Freq: Every day | ORAL | Status: DC
  Administered 2021-08-20 – 2021-08-22 (×3): 20 mg via ORAL
  Filled 2021-08-19 (×4): qty 1

## 2021-08-19 MED ORDER — HEPARIN (PORCINE) 25000 UT/250ML-% IV SOLN
1500.0000 [IU]/h | INTRAVENOUS | Status: DC
  Filled 2021-08-19: qty 250

## 2021-08-19 MED ORDER — AMLODIPINE BESYLATE 5 MG PO TABS: 5.0000 mg | ORAL_TABLET | Freq: Every day | ORAL | Status: DC

## 2021-08-19 MED ORDER — ALBUTEROL SULFATE (2.5 MG/3ML) 0.083% IN NEBU: 3.0000 mL | INHALATION_SOLUTION | RESPIRATORY_TRACT | Status: DC | PRN

## 2021-08-19 MED ORDER — HEPARIN (PORCINE) 25000 UT/250ML-% IV SOLN
1900.0000 [IU]/h | INTRAVENOUS | Status: DC
  Administered 2021-08-20: 1500 [IU]/h via INTRAVENOUS
  Administered 2021-08-20: 1700 [IU]/h via INTRAVENOUS
  Administered 2021-08-21: 1900 [IU]/h via INTRAVENOUS
  Filled 2021-08-19 (×2): qty 250

## 2021-08-19 MED ORDER — FUROSEMIDE 20 MG PO TABS
20.0000 mg | ORAL_TABLET | Freq: Every day | ORAL | Status: DC
  Administered 2021-08-20 – 2021-08-22 (×2): 20 mg via ORAL
  Filled 2021-08-19 (×2): qty 1

## 2021-08-19 MED ORDER — OXYCODONE HCL 5 MG PO TABS
5.0000 mg | ORAL_TABLET | Freq: Once | ORAL | Status: AC
  Administered 2021-08-19: 5 mg via ORAL
  Filled 2021-08-19: qty 1

## 2021-08-19 MED ORDER — ATORVASTATIN CALCIUM 10 MG PO TABS
10.0000 mg | ORAL_TABLET | Freq: Every day | ORAL | Status: DC
  Administered 2021-08-20 – 2021-08-22 (×3): 10 mg via ORAL
  Filled 2021-08-19 (×3): qty 1

## 2021-08-19 MED ORDER — FLUTICASONE PROPIONATE 50 MCG/ACT NA SUSP
1.0000 | Freq: Every day | NASAL | Status: DC
  Administered 2021-08-20 – 2021-08-22 (×3): 1 via NASAL
  Filled 2021-08-19 (×2): qty 16

## 2021-08-19 NOTE — Telephone Encounter (Signed)
Requested Prescriptions  Pending Prescriptions Disp Refills  . atorvastatin (LIPITOR) 20 MG tablet [Pharmacy Med Name: ATORVASTATIN 20 MG TABLET] 45 tablet 2    Sig: TAKE 1/2 TABLET BY MOUTH DAILY     Cardiovascular:  Antilipid - Statins Failed - 08/19/2021 11:40 AM      Failed - HDL in normal range and within 360 days    HDL  Date Value Ref Range Status  05/26/2021 36 (L) >39 mg/dL Final         Passed - Total Cholesterol in normal range and within 360 days    Cholesterol, Total  Date Value Ref Range Status  05/26/2021 124 100 - 199 mg/dL Final   Cholesterol Piccolo, Waived  Date Value Ref Range Status  09/07/2018 130 <200 mg/dL Final    Comment:                            Desirable                <200                         Borderline High      200- 239                         High                     >239          Passed - LDL in normal range and within 360 days    LDL Chol Calc (NIH)  Date Value Ref Range Status  05/26/2021 71 0 - 99 mg/dL Final         Passed - Triglycerides in normal range and within 360 days    Triglycerides  Date Value Ref Range Status  05/26/2021 86 0 - 149 mg/dL Final   Triglyceride fasting, serum  Date Value Ref Range Status  02/17/2010 111 mg/dL    Triglycerides Piccolo,Waived  Date Value Ref Range Status  09/07/2018 95 <150 mg/dL Final    Comment:                            Normal                   <150                         Borderline High     150 - 199                         High                200 - 499                         Very High                >499          Passed - Patient is not pregnant      Passed - Valid encounter within last 12 months    Recent Outpatient Visits          2 weeks ago Hypotension, unspecified hypotension type   [facility], Megan P, DO  1 month ago Hypotension, unspecified hypotension type   [facility] [place], Megan P, DO   1 month ago Acute right-sided  low back pain without sciatica   [facility] [place], Megan P, DO   1 month ago Acute right-sided low back pain without sciatica   [facility] [place], Megan P, DO   1 month ago Flank pain   [facility] [place], Jake Church, NP      Future Appointments            In 1 week Laural Benes, Oralia Rud, DO [facility]   In 3 months  [facility]   In 4 months Gwen Pounds, Dineen Kid, MD [facility]

## 2021-08-19 NOTE — ED Provider Notes (Signed)
[facility] Emergency Department Provider Note ____________________________________________   Event Date/Time   First MD Initiated Contact with Patient 08/19/21 1826     (approximate)  I have reviewed the triage vital signs and the nursing notes.  HISTORY  Chief Complaint dvt   HPI Jeff [name]. is a 70 y.o. malewho presents to the ED for evaluation of DVT  Chart review indicates history of obesity, HTN, DM, atrial fibrillation on Xarelto.  DVT with IVC filter in the past, since removed in 2019 and subsequent known IVC stenosis.  Looks like he has had multiple angiograms, angioplasty and stent placements to his IVC.  Patient presents to the ED for evaluation of atraumatic swelling and pain of his left leg throughout the day today.  Reports diffuse swelling of his left calf and lower leg atraumatically, spreading proximally to the medial aspect of his left leg this afternoon.  Reports associated aching pain throughout his leg that is moderate severity.  Denies fevers or systemic symptoms, chest pain or shortness of breath.  Past Medical History:  Diagnosis Date   Chronic deep vein thrombosis (DVT) (HCC)    Chronic low back pain    Chronic systolic heart failure (HCC)    a. TTE 2011 with EF 40-45% per notes   Diabetes mellitus without complication [facility])    Erectile dysfunction    HLD (hyperlipidemia)    Hypertension    Hypogonadism in male    Permanent atrial fibrillation (HCC)    a. s/p DCCV 2011; b. redeveloped Afib in 2016; c. CHADS2VASc at least 5 (CHF, HTN, age x 1, DM, vascular disease); d. on Xarelto   Torn meniscus    right knee    Patient Active Problem List   Diagnosis Date Noted   Congestive heart failure (HCC) 06/29/2021   Flank pain 06/29/2021   Hyperkalemia 01/07/2020   Proteinuria 01/07/2020   Stage 3a chronic kidney disease (HCC) 01/07/2020   Hypertrophy of both inferior nasal turbinates 04/04/2019   Elevated hemoglobin  (HCC) 09/07/2018   Inferior vena caval stenosis 05/22/2018   IVC thrombosis (HCC) 01/04/2018   Morbid obesity (HCC) 01/04/2018   Chronic sinusitis 12/29/2017   History of DVT (deep vein thrombosis) 12/17/2017   Acute deep vein thrombosis (DVT) of femoral vein of left lower extremity (HCC) 11/28/2017   BPH (benign prostatic hyperplasia) 12/20/2016   Advanced care planning/counseling discussion    Erectile dysfunction 03/29/2015   Hypogonadism in male 03/29/2015   Mixed hyperlipidemia 02/02/2013   Diabetes mellitus (HCC) 02/02/2013   DVT of lower limb, acute (HCC) 11/08/2012   HYPERTENSION, BENIGN 02/23/2010   Congestive dilated cardiomyopathy (HCC) 02/23/2010   Atrial fibrillation, chronic (HCC) 02/20/2010    Past Surgical History:  Procedure Laterality Date   COLONOSCOPY WITH PROPOFOL N/A 05/28/2016   Procedure: COLONOSCOPY WITH PROPOFOL;  Surgeon: Midge Minium, MD;  Location: [facility];  Service: Endoscopy;  Laterality: N/A;  diabetic - oral meds   DVT, leg Left    INSERTION OF VENA CAVA FILTER  2014   KNEE ARTHROSCOPY WITH MEDIAL MENISECTOMY Right 05/31/2019   Procedure: KNEE ARTHROSCOPY WITH partial  MEDIAL MENISECTOMY;  Surgeon: Signa Kell, MD;  Location: [facility];  Service: Orthopedics;  Laterality: Right;   PERIPHERAL VASCULAR THROMBECTOMY Left 12/01/2017   Procedure: PERIPHERAL VASCULAR THROMBECTOMY;  Surgeon: Renford Dills, MD;  Location: [facility] IN[facility];  Service: Cardiovascular;  Laterality: Left;    Prior to Admission medications   Medication Sig Start  Date End Date Taking? Authorizing Provider  albuterol (VENTOLIN HFA) 108 (90 Base) MCG/ACT inhaler Inhale 2 puffs into the lungs every 4 (four) hours as needed for wheezing or shortness of breath. 05/26/21   Johnson, Megan P, DO  atorvastatin (LIPITOR) 20 MG tablet Take 0.5 tablets (10 mg total) by mouth daily. 05/26/21   Johnson, Megan P, DO  benazepril (LOTENSIN) 20 MG tablet Take 1 tablet (20  mg total) by mouth daily. 07/16/21   Olevia Perches P, DO  dapagliflozin propanediol (FARXIGA) 10 MG TABS tablet Take 1 tablet (10 mg total) by mouth daily before breakfast. 05/26/21   Olevia Perches P, DO  doxazosin (CARDURA) 2 MG tablet Take by mouth. 06/25/21   [provider]  fluticasone (FLONASE) 50 MCG/ACT nasal spray Place into both nostrils daily as needed for allergies or rhinitis.    [provider]  furosemide (LASIX) 20 MG tablet TAKE 1 TABLET BY MOUTH EVERY DAY 12/08/20   Antonieta Iba, MD  metFORMIN (GLUCOPHAGE-XR) 500 MG 24 hr tablet Take 2 tablets (1,000 mg total) by mouth 2 (two) times daily as needed. 05/26/21   Johnson, Megan P, DO  metoprolol tartrate (LOPRESSOR) 100 MG tablet TAKE 1 TABLET BY MOUTH TWICE A DAY 04/03/21   Antonieta Iba, MD  naproxen (NAPROSYN) 500 MG tablet Take 1 tablet (500 mg total) by mouth 2 (two) times daily with a meal. 07/06/21   Johnson, Megan P, DO  rivaroxaban (XARELTO) 20 MG TABS tablet Take 1 tablet (20 mg total) by mouth daily. 08/17/21   Antonieta Iba, MD  tiZANidine (ZANAFLEX) 4 MG tablet TAKE 1 TABLET BY MOUTH EVERY 6 HOURS AS NEEDED FOR MUSCLE SPASMS. 07/29/21   Dorcas Carrow, DO    Allergies Patient has no known allergies.  Family History  Problem Relation Age of Onset   Lymphoma Mother    Heart failure Father    Diabetes Father    Atrial fibrillation Brother    Diabetes Mellitus II Other    Heart attack Neg Hx    Hypertension Neg Hx    Cancer Neg Hx    COPD Neg Hx    Stroke Neg Hx    Prostate cancer Neg Hx    Kidney cancer Neg Hx    Bladder Cancer Neg Hx     Social History Social History   Tobacco Use   Smoking status: Never   Smokeless tobacco: Never  Vaping Use   Vaping Use: Never used  Substance Use Topics   Alcohol use: No    Alcohol/week: 0.0 standard drinks   Drug use: No    Review of Systems  Constitutional: No fever/chills Eyes: No visual changes. ENT: No sore  throat. Cardiovascular: Denies chest pain. Respiratory: Denies shortness of breath. Gastrointestinal: No abdominal pain.  No nausea, no vomiting.  No diarrhea.  No constipation. Genitourinary: Negative for dysuria. Musculoskeletal: Negative for back pain. Positive atraumatic left leg swelling and pain. Skin: Positive for red skin discoloration to the left leg. Neurological: Negative for headaches, focal weakness or numbness.  ____________________________________________   PHYSICAL EXAM:  VITAL SIGNS: Vitals:   08/19/21 1700  BP: (!) 145/83  Pulse: (!) 114  Resp: 18  Temp: 98.4 F (36.9 C)  SpO2: 97%    Constitutional: Alert and oriented. Well appearing and in no acute distress. Sitting up in the bedside chair, well-appearing and conversational in full sentences. Eyes: Conjunctivae are normal. PERRL. EOMI. Head: Atraumatic. Nose: No congestion/rhinnorhea. Mouth/Throat: Mucous membranes are  moist.  Oropharynx non-erythematous. Neck: No stridor. No cervical spine tenderness to palpation. Cardiovascular: Tachycardic rate, regular rhythm. Grossly normal heart sounds.  Good peripheral circulation. Respiratory: Normal respiratory effort.  No retractions. Lungs CTAB. Gastrointestinal: Soft , nondistended, nontender to palpation. No CVA tenderness. Musculoskeletal: No signs of acute trauma. Clear asymmetry with left leg swelling compared to the right.  Pitting edema and swelling throughout the left lower leg with erythematous discoloration.  Streak of erythema spreading proximally to the medial aspect of his left leg as pictured below. Neurologic:  Normal speech and language. No gross focal neurologic deficits are appreciated. No gait instability noted. Skin:  Skin is warm, dry and intact. No rash noted. Psychiatric: Mood and affect are normal. Speech and behavior are normal.    ____________________________________________   LABS (all labs ordered are listed, but only abnormal  results are displayed)  Labs Reviewed  CBC WITH DIFFERENTIAL/PLATELET - Abnormal; Notable for the following components:      Result Value   Hemoglobin 12.7 (*)    HCT 38.6 (*)    Abs Immature Granulocytes 0.08 (*)    All other components within normal limits  BASIC METABOLIC PANEL - Abnormal; Notable for the following components:   Sodium 132 (*)    Potassium 3.3 (*)    Chloride 92 (*)    Glucose, Bld 129 (*)    Calcium 8.8 (*)    All other components within normal limits  RESP PANEL BY RT-PCR (FLU A&B, COVID) ARPGX2  LACTIC ACID, PLASMA  LACTIC ACID, PLASMA  PROTIME-INR  APTT   ____________________________________________  12 Lead EKG   ____________________________________________  RADIOLOGY  ED MD interpretation: Venous ultrasound reviewed by me.  Official radiology report(s): US Venous Img Lower Unilateral Left (DVT)  Result Date: 08/19/2021 CLINICAL DATA:  Edema, redness, history of chronic DVT EXAM: LEFT LOWER EXTREMITY VENOUS DOPPLER ULTRASOUND TECHNIQUE: Gray-scale sonography with compression, as well as color and duplex ultrasound, were performed to evaluate the deep venous system(s) from the level of the common femoral vein through the popliteal and proximal calf veins. COMPARISON:  08/17/2018 FINDINGS: VENOUS Stable hypoechoic wall thickening in the popliteal vein and midportion of the femoral vein with incomplete compressibility but continued antegrade flow signal on color Doppler. Normal compressibility of the common femoral, as well as the visualized calf veins. Visualized portions of profunda femoral vein and great saphenous vein unremarkable. No filling defects to suggest acute DVT on grayscale or color Doppler imaging. Doppler waveforms show normal direction of venous flow, normal respiratory phasicity and response to augmentation. Limited views of the contralateral common femoral vein are unremarkable. OTHER Subcutaneous edema in the calf. Limitations: none  IMPRESSION: 1. Stable chronic nonocclusive post thrombotic changes in the popliteal and femoral veins. 2. No evidence of acute superimposed DVT. Electronically Signed   By: Corlis Leak M.D.   On: 08/19/2021 17:52    ____________________________________________   PROCEDURES and INTERVENTIONS  Procedure(s) performed (including Critical Care):  .Critical Care Performed by: Delton Prairie, MD Authorized by: Delton Prairie, MD   Critical care provider statement:    Critical care time (minutes):  30   Critical care was necessary to treat or prevent imminent or life-threatening deterioration of the following conditions:  Cardiac failure and circulatory failure   Critical care was time spent personally by me on the following activities:  Ordering and performing treatments and interventions, ordering and review of laboratory studies, ordering and review of radiographic studies, discussions with consultants, examination of patient, development of  treatment plan with patient or surrogate, re-evaluation of patient's condition and review of old charts  Medications - No data to display  ____________________________________________   MDM / ED COURSE   Pleasant 70 year old male on Xarelto presents to the ED with 1 day of left leg erythema and swelling, with evidence of acute DVT requiring heparin drip and medical admission.  He is tachycardic but hemodynamically stable without hypoxia.  Exam with clear signs of clinical acute DVT to his left leg.  Lesser suspicion for cellulitis or infectious pathology.  Blood work is largely unremarkable, and without evidence of severe infectious pathology.  Venous ultrasound is read as having evidence of chronic nonocclusive DVT, but this does not fit his clinical picture.  I discussed the case with vascular surgery, who agrees and recommends heparinization, medical admission with plans for venogram in the morning.  Clinical Course as of 08/19/21 1910  Wed Aug 19, 2021   8786 I discuss with Dr. Gilda Crease , vascular surgery, who recommends heparin drip and admission.  [DS]  1906 Updated patient of this recommendation, and he is agreeable to admission. Just wants me to chat with his daughter when she arrives. She's an ophthalmologist [DS]    Clinical Course User Index [DS] Delton Prairie, MD    ____________________________________________   FINAL CLINICAL IMPRESSION(S) / ED DIAGNOSES  Final diagnoses:  Acute deep vein thrombosis (DVT) of femoral vein of left lower extremity (HCC)  Left leg pain  Left leg swelling     ED Discharge Orders     None        Mickell Birdwell   Note:  This document was prepared using Dragon voice recognition software and may include unintentional dictation errors.    Delton Prairie, MD 08/19/21 [id number]

## 2021-08-19 NOTE — ED Notes (Signed)
Pt states that he went to the dermatologist today for a procedure and the dr saw his leg and told him to go straight to the ER. Pt had a stent placed in his groin 4 years ago for blood clots. Pt states that the left leg is his trouble leg. Pt has swelling to the left lower leg with a brawny coloration to bilat lower extrem. Pt has bulging veins over top of his left knee with bright red coloration to the inner thigh. Pt states that his leg looked normal yesterday and today it became red.

## 2021-08-19 NOTE — Patient Instructions (Signed)

## 2021-08-19 NOTE — Progress Notes (Signed)
New Patient Visit  Subjective  Jeff [name]. is a 70 y.o. male who presents for the following: check growth (R cheek, hx of LN2 in past and it came back), check spots (Face, >29m, ), and check leg (L leg, hx of recent DVT and procedure, pt had onset of swelling, pain and redness today, pt said his PCP recommended him go to ER and pt wants to know if he should go to ER).  The following portions of the chart were reviewed this encounter and updated as appropriate:   Tobacco  Allergies  Meds  Problems  Med Hx  Surg Hx  Fam Hx     Review of Systems:  No other skin or systemic complaints except as noted in HPI or Assessment and Plan.  Objective  Well appearing patient in no apparent distress; mood and affect are within normal limits.  A focused examination was performed including face, . Relevant physical exam findings are noted in the Assessment and Plan.  R cheek x 1, L sup sideburn/scalp x 2, Total = 3 (3) Erythematous keratotic or waxy stuck-on papule or plaque.   L ear mid helix Purple macule  L ear x 2, R ear antetragus x 1 (3) Pink scaly macules   Left Buccal Cheek Telangiectasias face  L leg Erythema and edema from thigh to foot   Assessment & Plan   Actinic Damage - chronic, secondary to cumulative UV radiation exposure/sun exposure over time - diffuse scaly erythematous macules with underlying dyspigmentation - Recommend daily broad spectrum sunscreen SPF 30+ to sun-exposed areas, reapply every 2 hours as needed.  - Recommend staying in the shade or wearing long sleeves, sun glasses (UVA+UVB protection) and wide brim hats (4-inch brim around the entire circumference of the hat). - Call for new or changing lesions.  Lentigines - Scattered tan macules - Due to sun exposure - Benign-appering, observe - Recommend daily broad spectrum sunscreen SPF 30+ to sun-exposed areas, reapply every 2 hours as needed. - Call for any changes  Seborrheic Keratoses -  Stuck-on, waxy, tan-brown papules and/or plaques  - Benign-appearing - Discussed benign etiology and prognosis. - Observe - Call for any changes  Inflamed seborrheic keratosis R cheek x 1, L sup sideburn/scalp x 2, Total = 3 Destruction of lesion - R cheek x 1, L sup sideburn/scalp x 2, Total = 3 Complexity: simple   Destruction method: cryotherapy   Informed consent: discussed and consent obtained   Timeout:  patient name, date of birth, surgical site, and procedure verified Lesion destroyed using liquid nitrogen: Yes   Region frozen until ice ball extended beyond lesion: Yes   Outcome: patient tolerated procedure well with no complications   Post-procedure details: wound care instructions given    Venous [place] L ear mid helix Benign Discussed BBL / laser  AK (actinic keratosis) (3) L ear x 2, R ear antitragus x 1 Destruction of lesion - L ear x 2, R ear antetragus x 1 Complexity: simple   Destruction method: cryotherapy   Informed consent: discussed and consent obtained   Timeout:  patient name, date of birth, surgical site, and procedure verified Lesion destroyed using liquid nitrogen: Yes   Region frozen until ice ball extended beyond lesion: Yes   Outcome: patient tolerated procedure well with no complications   Post-procedure details: wound care instructions given    Telangiectasia Cheeks Discussed BBL $350 per txt session, several sessions for best results  Leg edema, left L leg Hx  of recent DVT, concerned for recurrence with acute onset of pain, erythema and edema  Recommend pt go to ER at [facility] or [facility] today to get checked/ultrasound  Advised of significant potential consequence if DVT (Pulmonary Embolus).  Return for 3-41m to recheck ISK.  I, Ardis Rowan, RMA, am acting as scribe for Armida Sans, MD . Documentation: I have reviewed the above documentation for accuracy and completeness, and I agree with the above.  Armida Sans, MD

## 2021-08-19 NOTE — H&P (Addendum)
History and Physical    [employer]. YPP:[id number] DOB: May 10, 1951 DOA: 08/19/2021  PCP: Dorcas Carrow, DO    Patient coming from:  Home    Chief Complaint:  Left leg swelling.   HPI: Jeff [name]. is a 70 y.o. male seen in ed with complaints of left leg swelling that started acutely today.  Per patient report patient has a history of blood clots and has been on blood thinner for a long time.  Patient had an IVC filter that was removed in 2019 patient has had multiple angiograms angioplasty and stents to his IVC by vascular surgeon. Patient notes pain in the left leg.  Started acutely onset today, does not remember any trauma or Leg swelling.  No chest pain no blurred vision no nausea vomiting no headaches or hypertension speech or gait issue. Pt has always been on xarelto for his blood clots.  Left leg was swollen, warmth, and erythema. Pain is 6/10, erythema, warmth, NR, dull ache , worse with movement and better with rest. [place] two hours trip each way.  I am not sure why pt is having rec VTE on NOAcs and needs hematology d/w pt that he is advised for hematology consult to find out any factor def and evaluation of failure of therapy with xarelto. Pt has not had any headache or neurological symptoms or speech gait stroke like symptoms or chest pain or abdominal pain or bowel or bladder issues.   Pt has past medical history of C/H DVT, h/o a.fib and rec dvt on anticoagulation.  ED Course:  Vitals:   08/19/21 1906 08/19/21 1907 08/19/21 2037 08/19/21 2130  BP: (!) 149/86  (!) 151/87 124/68  Pulse: 99 (!) 108 100 98  Resp: 20 (!) 23 14 19   Temp:      TempSrc:      SpO2: 100% 100% 98% 99%  Weight:      Height:      The emergency room patient has 2 SIRS criteria, continue current treatment and respiratory rate. Ultrasound done today of the left leg stable shows stable chronic nonocclusive post thrombotic changes in the popliteal and femoral veins within  the acute superimposed DVT. ED provider discussed with vascular surgery Dr. who will see pt for evaluation. Recommended patient be started on heparin GTT.  Hospitalist requested to admit patient for acute DVT of the left femoral vein, left leg pain and swelling. Lab work shows a CBC with a normal white count and hemoglobin of 12.7, sodium 132 potassium 3.3, glucose 129 no EKG in chart.  In the emergency room patient was given Tylenol and oxycodone for pain. Daughter at bedside is a physician  and is giving history as well. Pt started on heparin drip protocol in ED. Venous usg shows: 1. Stable chronic nonocclusive post thrombotic changes in the popliteal and femoral veins. 2. No evidence of acute superimposed DVT.     Review of Systems:  Review of Systems  Constitutional: Negative.   HENT: Negative.    Eyes: Negative.   Respiratory: Negative.    Cardiovascular: Negative.   Gastrointestinal: Negative.   Musculoskeletal:  Positive for joint pain.  Skin: Negative.   Neurological: Negative.   All other systems reviewed and are negative.   Past Medical History:  Diagnosis Date   Chronic deep vein thrombosis (DVT) (HCC)    Chronic low back pain    Chronic systolic heart failure (HCC)    a. TTE 2011 with  EF 40-45% per notes   Diabetes mellitus without complication (HCC)    Erectile dysfunction    HLD (hyperlipidemia)    Hypertension    Hypogonadism in male    Permanent atrial fibrillation (HCC)    a. s/p DCCV 2011; b. redeveloped Afib in 2016; c. CHADS2VASc at least 5 (CHF, HTN, age x 1, DM, vascular disease); d. on Xarelto   Torn meniscus    right knee    Past Surgical History:  Procedure Laterality Date   COLONOSCOPY WITH PROPOFOL N/A 05/28/2016   Procedure: COLONOSCOPY WITH PROPOFOL;  Surgeon: Midge Minium, MD;  Location: [facility];  Service: Endoscopy;  Laterality: N/A;  diabetic - oral meds   DVT, leg Left    INSERTION OF VENA CAVA FILTER  2014   KNEE  ARTHROSCOPY WITH MEDIAL MENISECTOMY Right 05/31/2019   Procedure: KNEE ARTHROSCOPY WITH partial  MEDIAL MENISECTOMY;  Surgeon: Signa Kell, MD;  Location: [facility];  Service: Orthopedics;  Laterality: Right;   PERIPHERAL VASCULAR THROMBECTOMY Left 12/01/2017   Procedure: PERIPHERAL VASCULAR THROMBECTOMY;  Surgeon: Renford Dills, MD;  Location: [facility];  Service: Cardiovascular;  Laterality: Left;     reports that he has never smoked. He has never used smokeless tobacco. He reports that he does not drink alcohol and does not use drugs.  No Known Allergies  Family History  Problem Relation Age of Onset   Lymphoma Mother    Heart failure Father    Diabetes Father    Atrial fibrillation Brother    Diabetes Mellitus II Other    Heart attack Neg Hx    Hypertension Neg Hx    Cancer Neg Hx    COPD Neg Hx    Stroke Neg Hx    Prostate cancer Neg Hx    Kidney cancer Neg Hx    Bladder Cancer Neg Hx     Prior to Admission medications   Medication Sig Start Date End Date Taking? Authorizing Provider  albuterol (VENTOLIN HFA) 108 (90 Base) MCG/ACT inhaler Inhale 2 puffs into the lungs every 4 (four) hours as needed for wheezing or shortness of breath. 05/26/21   Johnson, Megan P, DO  atorvastatin (LIPITOR) 20 MG tablet Take 0.5 tablets (10 mg total) by mouth daily. 05/26/21   Johnson, Megan P, DO  benazepril (LOTENSIN) 20 MG tablet Take 1 tablet (20 mg total) by mouth daily. 07/16/21   Olevia Perches P, DO  dapagliflozin propanediol (FARXIGA) 10 MG TABS tablet Take 1 tablet (10 mg total) by mouth daily before breakfast. 05/26/21   Olevia Perches P, DO  doxazosin (CARDURA) 2 MG tablet Take by mouth. 06/25/21   [provider]  fluticasone (FLONASE) 50 MCG/ACT nasal spray Place into both nostrils daily as needed for allergies or rhinitis.    [provider]  furosemide (LASIX) 20 MG tablet TAKE 1 TABLET BY MOUTH EVERY DAY 12/08/20   Antonieta Iba, MD   metFORMIN (GLUCOPHAGE-XR) 500 MG 24 hr tablet Take 2 tablets (1,000 mg total) by mouth 2 (two) times daily as needed. 05/26/21   Johnson, Megan P, DO  metoprolol tartrate (LOPRESSOR) 100 MG tablet TAKE 1 TABLET BY MOUTH TWICE A DAY 04/03/21   Antonieta Iba, MD  naproxen (NAPROSYN) 500 MG tablet Take 1 tablet (500 mg total) by mouth 2 (two) times daily with a meal. 07/06/21   Johnson, Megan P, DO  rivaroxaban (XARELTO) 20 MG TABS tablet Take 1 tablet (20 mg total) by mouth  daily. 08/17/21   Antonieta Iba, MD  tiZANidine (ZANAFLEX) 4 MG tablet TAKE 1 TABLET BY MOUTH EVERY 6 HOURS AS NEEDED FOR MUSCLE SPASMS. 07/29/21   Dorcas Carrow, DO    Physical Exam: Vitals:   08/19/21 1906 08/19/21 1907 08/19/21 2037 08/19/21 2130  BP: (!) 149/86  (!) 151/87 124/68  Pulse: 99 (!) 108 100 98  Resp: 20 (!) 23 14 19   Temp:      TempSrc:      SpO2: 100% 100% 98% 99%  Weight:      Height:       Physical Exam Vitals reviewed.  Constitutional:      General: He is not in acute distress.    Appearance: He is not ill-appearing.  HENT:     Head: Normocephalic and atraumatic.     Right Ear: External ear normal.     Left Ear: External ear normal.     Nose: Nose normal.     Mouth/Throat:     Mouth: Mucous membranes are moist.  Eyes:     Extraocular Movements: Extraocular movements intact.     Pupils: Pupils are equal, round, and reactive to light.  Neck:     Vascular: No carotid bruit.  Cardiovascular:     Rate and Rhythm: Normal rate and regular rhythm.     Pulses: Normal pulses.     Heart sounds: Normal heart sounds.  Pulmonary:     Effort: Pulmonary effort is normal.     Breath sounds: Normal breath sounds.  Abdominal:     General: Bowel sounds are normal. There is no distension.     Palpations: Abdomen is soft. There is no mass.     Tenderness: There is no abdominal tenderness. There is no guarding.     Hernia: No hernia is present.  Musculoskeletal:        General: Swelling and  tenderness present.     Right lower leg: No edema.     Left lower leg: Edema present.       Legs:     Comments: Left leg is showing significant swelling redness and tenderness, pt able to bear weight with difficulty.  Redness goes up to left groin.    Skin:    General: Skin is warm.     Findings: Erythema present.  Neurological:     General: No focal deficit present.     Mental Status: He is alert and oriented to person, place, and time.  Psychiatric:        Mood and Affect: Mood normal.        Behavior: Behavior normal.    Labs on Admission: I have personally reviewed following labs and imaging studies  No results for input(s): CKTOTAL, CKMB, TROPONINI in the last 72 hours. Lab Results  Component Value Date   WBC 9.0 08/19/2021   HGB 12.7 (L) 08/19/2021   HCT 38.6 (L) 08/19/2021   MCV 89.8 08/19/2021   PLT 195 08/19/2021    Recent Labs  Lab 08/19/21 1820  NA 132*  K 3.3*  CL 92*  CO2 25  BUN 23  CREATININE 1.19  CALCIUM 8.8*  GLUCOSE 129*   Lab Results  Component Value Date   CHOL 124 05/26/2021   HDL 36 (L) 05/26/2021   LDLCALC 71 05/26/2021   TRIG 86 05/26/2021   No results found for: DDIMER Invalid input(s): POCBNP  Urinalysis    Component Value Date/Time   COLORURINE YELLOW (A) 08/11/2019 1943  APPEARANCEUR Clear 06/29/2021 1101   LABSPEC 1.023 08/11/2019 1943   PHURINE 5.0 08/11/2019 1943   GLUCOSEU Negative 06/29/2021 1101   HGBUR SMALL (A) 08/11/2019 1943   BILIRUBINUR Negative 06/29/2021 1101   KETONESUR 20 (A) 08/11/2019 1943   PROTEINUR Negative 06/29/2021 1101   PROTEINUR 100 (A) 08/11/2019 1943   NITRITE Negative 06/29/2021 1101   NITRITE NEGATIVE 08/11/2019 1943   LEUKOCYTESUR 1+ (A) 06/29/2021 1101   LEUKOCYTESUR NEGATIVE 08/11/2019 1943    COVID-19 Labs No results for input(s): DDIMER, FERRITIN, LDH, CRP in the last 72 hours.  Radiological Exams on Admission: US Venous Img Lower Unilateral Left (DVT)  Result Date:  08/19/2021 CLINICAL DATA:  Edema, redness, history of chronic DVT EXAM: LEFT LOWER EXTREMITY VENOUS DOPPLER ULTRASOUND TECHNIQUE: Gray-scale sonography with compression, as well as color and duplex ultrasound, were performed to evaluate the deep venous system(s) from the level of the common femoral vein through the popliteal and proximal calf veins. COMPARISON:  08/17/2018 FINDINGS: VENOUS Stable hypoechoic wall thickening in the popliteal vein and midportion of the femoral vein with incomplete compressibility but continued antegrade flow signal on color Doppler. Normal compressibility of the common femoral, as well as the visualized calf veins. Visualized portions of profunda femoral vein and great saphenous vein unremarkable. No filling defects to suggest acute DVT on grayscale or color Doppler imaging. Doppler waveforms show normal direction of venous flow, normal respiratory phasicity and response to augmentation. Limited views of the contralateral common femoral vein are unremarkable. OTHER Subcutaneous edema in the calf. Limitations: none IMPRESSION: 1. Stable chronic nonocclusive post thrombotic changes in the popliteal and femoral veins. 2. No evidence of acute superimposed DVT. Electronically Signed   By: Corlis Leak M.D.   On: 08/19/2021 17:52    EKG: Independently reviewed.  None    Assessment/Plan Principal Problem:   Left leg DVT (HCC) Active Problems:   Atrial fibrillation, chronic (HCC)   HYPERTENSION, BENIGN   Diabetes mellitus (HCC)   Congestive heart failure (HCC)   BPH (benign prostatic hyperplasia)   Left leg swelling/DVT/atrial fibrillation: Pt has extensive left leg swelling and erythema and he has had VTE for 15 years , etiology is unclear, may benefit from hypercoagulable workup . Tried patient's left flank pain mildly limited movement DVT.   Vascular consult has been requested per ED MD. Patient to be admitted to progressive care unit for continuous cardiac  monitoring. We will continue patient on heparin GTT per protocol. Hematology consult per a.m. team, vascular consult ordered greatly appreciate consult management and care.    Hypertension: Blood pressure 124/68, pulse 98, temperature 98.4 F (36.9 C), temperature source Oral, resp. rate 19, height 5\' 10"  (1.778 m), weight 105.2 kg, SpO2 99 %. Continue patient on benazepril and hold patient's amlodipine.  Diabetes mellitus type 2: We will continue patient on glycemic protocol.  Congestive heart failure: Stable we will ask patient to limit salt and as needed Lasix. Continue patient on door, benazepril, metoprolol.  BPH: Resume Flomax, difficulty urinating. Other meds once med rec Patient is completed.   DVT prophylaxis:  Heparin gtt   Code Status:  Full code    Family Communication:  (Friend)  [phone] (Mobile)   Disposition Plan:  Home    Consults called:  Dr.Schreir vascular   Admission status: Inpatient     [phone] MD [facility] [phone] How to contact the [facility] Attending or Consulting provider 7A - 7P or covering provider during after hours 7P -7A,  for this patient.    Check the care team in [facility] and look for a) attending/consulting [facility] provider listed and b) the [facility] team listed Log into [email or web address] and use [facility]'s universal password to access. If you do not have the password, please contact the hospital operator. Locate the [facility] provider you are looking for under [facility] and page to a number that you can be directly reached. If you still have difficulty reaching the provider, please page the [facility] (Director on Call) for the Hospitalists listed on amion for assistance. [email or web address] Password [facility] 08/19/2021, 10:28 PM

## 2021-08-19 NOTE — ED Triage Notes (Signed)
Pt comes pov from skin md with poss dvt. Hx of them with stent placement. Left leg is swollen, red.

## 2021-08-19 NOTE — Progress Notes (Addendum)
ANTICOAGULATION CONSULT NOTE   Pharmacy Consult for IV heparin Indication: DVT  No Known Allergies  Patient Measurements: Height: 5\' 10"  (177.8 cm) Weight: 105.2 kg (232 lb) IBW/kg (Calculated) : 73 Heparin Dosing Weight: 95.4 kg  Vital Signs: Temp: 98.4 F (36.9 C) (10/12 1700) Temp Source: Oral (10/12 1700) BP: 145/83 (10/12 1700) Pulse Rate: 114 (10/12 1700)  Labs: Recent Labs    08/19/21 1820  HGB 12.7*  HCT 38.6*  PLT 195  CREATININE 1.19    Estimated Creatinine Clearance: 70.2 mL/min (by C-G formula based on SCr of 1.19 mg/dL).   Medical History: Past Medical History:  Diagnosis Date   Chronic deep vein thrombosis (DVT) (HCC)    Chronic low back pain    Chronic systolic heart failure (HCC)    a. TTE 2011 with EF 40-45% per notes   Diabetes mellitus without complication (HCC)    Erectile dysfunction    HLD (hyperlipidemia)    Hypertension    Hypogonadism in male    Permanent atrial fibrillation (HCC)    a. s/p DCCV 2011; b. redeveloped Afib in 2016; c. CHADS2VASc at least 5 (CHF, HTN, age x 1, DM, vascular disease); d. on Xarelto   Torn meniscus    right knee    Medications:  (Not in a hospital admission)  Scheduled:   heparin  4,000 Units Intravenous Once   Infusions:  PRN:  Anti-infectives (From admission, onward)    None       Assessment: 70YOM presenting with swelling of the left leg with coloration and redness. Patient has history of DVT with stent placement. Venous ultrasound shows evidence of chronic DVT. O2 sat 100% on RA. On rivaroxaban PTA (last dose unconfirmed). Hgb stable at 12.7, Plts WNL. Pharmacy consulted for heparin infusion with plan for venogram in the morning.   Goal of Therapy:  Heparin level 0.3-0.7 units/ml Monitor platelets by anticoagulation protocol: Yes   Plan:  No bolus (anticoagulation PTA and chronic DVT). Start heparin infusion at 1500 units/hr Baseline anti-Xa, aPTT, PT-INR. aPTT level in 8  hours. Daily CBC.   2017, PharmD Pharmacy Resident  08/19/2021 7:02 PM

## 2021-08-19 NOTE — Telephone Encounter (Signed)
Pt called to report insect bite, unsure what kind, "Not really sure if it was a bite."   Reports red area left leg, above knee towards groin, "Size of a dollar bill."  Reports 7/10 pain at that area. Left leg from knee down swollen "Twice the size of right leg." No redness. States pain is only at upper inner thigh. Noted today, "Felt pain."  Pt states he has appt with "Skin doctor" at 1615  and will have him look at area. Noted pt has H/O DTV of left leg, femoral area.  Advised ED now. States will follow disposition.     Reason for Disposition  Patient sounds very sick or weak to the triager    Unsure if insect bite. H/O left leg femoral DVT  Answer Assessment - Initial Assessment Questions 1. TYPE of INSECT: "What type of insect was it?"      unsure 2. ONSET: "When did you get bitten?"      Unsure 3. LOCATION: "Where is the insect bite located?"       4. REDNESS: "Is the area red or pink?" If Yes, ask: "What size is area of redness?" (inches or cm). "When did the redness start?"     Where the bite may be above knee. 5. PAIN: "Is there any pain?" If Yes, ask: "How bad is it?"  (Scale 1-10; or mild, moderate, severe)     7/10 6. ITCHING: "Does it itch?" If Yes, ask: "How bad is the itch?"    - MILD: doesn't interfere with normal activities   - MODERATE-SEVERE: interferes with work, school, sleep, or other activities      no 7. SWELLING: "How big is the swelling?" (inches, cm, or compare to coins)     Twice the size from left knee down to ankle. 8. OTHER SYMPTOMS: "Do you have any other symptoms?"  (e.g., difficulty breathing, hives)     No  Protocols used: Insect Bite-A-AH

## 2021-08-20 ENCOUNTER — Other Ambulatory Visit (INDEPENDENT_AMBULATORY_CARE_PROVIDER_SITE_OTHER): Payer: Self-pay | Admitting: Vascular Surgery

## 2021-08-20 ENCOUNTER — Encounter: Payer: Self-pay | Admitting: Dermatology

## 2021-08-20 ENCOUNTER — Telehealth: Payer: Self-pay | Admitting: Cardiovascular Disease

## 2021-08-20 DIAGNOSIS — I824Y2 Acute embolism and thrombosis of unspecified deep veins of left proximal lower extremity: Secondary | ICD-10-CM

## 2021-08-20 DIAGNOSIS — L03116 Cellulitis of left lower limb: Principal | ICD-10-CM

## 2021-08-20 DIAGNOSIS — I482 Chronic atrial fibrillation, unspecified: Secondary | ICD-10-CM | POA: Diagnosis not present

## 2021-08-20 DIAGNOSIS — I10 Essential (primary) hypertension: Secondary | ICD-10-CM

## 2021-08-20 DIAGNOSIS — Z86718 Personal history of other venous thrombosis and embolism: Secondary | ICD-10-CM | POA: Diagnosis not present

## 2021-08-20 DIAGNOSIS — R238 Other skin changes: Secondary | ICD-10-CM | POA: Diagnosis not present

## 2021-08-20 DIAGNOSIS — M7989 Other specified soft tissue disorders: Secondary | ICD-10-CM

## 2021-08-20 DIAGNOSIS — E1165 Type 2 diabetes mellitus with hyperglycemia: Secondary | ICD-10-CM

## 2021-08-20 LAB — COMPREHENSIVE METABOLIC PANEL
ALT: 14 U/L (ref 0–44)
AST: 12 U/L — ABNORMAL LOW (ref 15–41)
Albumin: 3.1 g/dL — ABNORMAL LOW (ref 3.5–5.0)
Alkaline Phosphatase: 52 U/L (ref 38–126)
Anion gap: 9 (ref 5–15)
BUN: 19 mg/dL (ref 8–23)
CO2: 28 mmol/L (ref 22–32)
Calcium: 8.3 mg/dL — ABNORMAL LOW (ref 8.9–10.3)
Chloride: 99 mmol/L (ref 98–111)
Creatinine, Ser: 1.25 mg/dL — ABNORMAL HIGH (ref 0.61–1.24)
GFR, Estimated: >60 mL/min (ref >60)
Glucose, Bld: 126 mg/dL — ABNORMAL HIGH (ref 70–99)
Potassium: 3.2 mmol/L — ABNORMAL LOW (ref 3.5–5.1)
Sodium: 136 mmol/L (ref 135–145)
Total Bilirubin: 1.3 mg/dL — ABNORMAL HIGH (ref 0.3–1.2)
Total Protein: 6 g/dL — ABNORMAL LOW (ref 6.5–8.1)

## 2021-08-20 LAB — CBC
HCT: 33.9 % — ABNORMAL LOW (ref 39.0–52.0)
Hemoglobin: 11.5 g/dL — ABNORMAL LOW (ref 13.0–17.0)
MCH: 30.9 pg (ref 26.0–34.0)
MCHC: 33.9 g/dL (ref 30.0–36.0)
MCV: 91.1 fL (ref 80.0–100.0)
Platelets: 173 10*3/uL (ref 150–400)
RBC: 3.72 MIL/uL — ABNORMAL LOW (ref 4.22–5.81)
RDW: 15.2 % (ref 11.5–15.5)
WBC: 7 10*3/uL (ref 4.0–10.5)
nRBC: 0 % (ref 0.0–0.2)

## 2021-08-20 LAB — APTT
aPTT: 53 seconds — ABNORMAL HIGH (ref 24–36)
aPTT: 57 seconds — ABNORMAL HIGH (ref 24–36)
aPTT: 67 seconds — ABNORMAL HIGH (ref 24–36)

## 2021-08-20 LAB — HIV ANTIBODY (ROUTINE TESTING W REFLEX): HIV Screen 4th Generation wRfx: NONREACTIVE

## 2021-08-20 MED ORDER — HEPARIN BOLUS VIA INFUSION
3000.0000 [IU] | Freq: Once | INTRAVENOUS | Status: AC
  Administered 2021-08-20: 3000 [IU] via INTRAVENOUS
  Filled 2021-08-20: qty 3000

## 2021-08-20 MED ORDER — SODIUM CHLORIDE 0.9 % IV SOLN: INTRAVENOUS | Status: DC

## 2021-08-20 MED ORDER — GUAIFENESIN-DM 100-10 MG/5ML PO SYRP
5.0000 mL | ORAL_SOLUTION | ORAL | Status: DC | PRN
  Administered 2021-08-20: 5 mL via ORAL
  Filled 2021-08-20: qty 5

## 2021-08-20 MED ORDER — POTASSIUM CHLORIDE CRYS ER 20 MEQ PO TBCR
40.0000 meq | EXTENDED_RELEASE_TABLET | Freq: Once | ORAL | Status: AC
  Administered 2021-08-20: 40 meq via ORAL
  Filled 2021-08-20: qty 4

## 2021-08-20 MED ORDER — AMLODIPINE BESYLATE 5 MG PO TABS
5.0000 mg | ORAL_TABLET | Freq: Every day | ORAL | Status: DC
  Administered 2021-08-20 – 2021-08-22 (×3): 5 mg via ORAL
  Filled 2021-08-20 (×3): qty 1

## 2021-08-20 MED ORDER — SODIUM CHLORIDE 0.9 % IV SOLN
2.0000 g | INTRAVENOUS | Status: DC
  Administered 2021-08-20 – 2021-08-22 (×3): 2 g via INTRAVENOUS
  Filled 2021-08-20: qty 20
  Filled 2021-08-20: qty 2
  Filled 2021-08-20: qty 20

## 2021-08-20 MED ORDER — HEPARIN BOLUS VIA INFUSION
1500.0000 [IU] | Freq: Once | INTRAVENOUS | Status: AC
  Administered 2021-08-20: 1500 [IU] via INTRAVENOUS
  Filled 2021-08-20: qty 1500

## 2021-08-20 NOTE — Progress Notes (Signed)
ANTICOAGULATION CONSULT NOTE   Pharmacy Consult for Heparin Indication: Hx of DVT  No Known Allergies  Patient Measurements: Height: 5\' 10"  (177.8 cm) Weight: 107.7 kg (237 lb 8 oz) IBW/kg (Calculated) : 73 Heparin Dosing Weight: 95.4 kg  Vital Signs: Temp: 98.5 F (36.9 C) (10/13 2045) Temp Source: Oral (10/13 2045) BP: 125/80 (10/13 2045) Pulse Rate: 109 (10/13 2045)  Labs: Recent Labs    08/19/21 1820 08/19/21 1909 08/19/21 1909 08/20/21 0602 08/20/21 0758 08/20/21 1556 08/20/21 2214  HGB 12.7*  --   --  11.5*  --   --   --   HCT 38.6*  --   --  33.9*  --   --   --   PLT 195  --   --  173  --   --   --   APTT  --  46*   < >  --  53* 57* 67*  LABPROT  --  21.2*  --   --   --   --   --   INR  --  1.8*  --   --   --   --   --   HEPARINUNFRC  --  >1.10*  --   --   --   --   --   CREATININE 1.19  --   --  1.25*  --   --   --    < > = values in this interval not displayed.     Estimated Creatinine Clearance: 67.6 mL/min (A) (by C-G formula based on SCr of 1.25 mg/dL (H)).   Medical History: Past Medical History:  Diagnosis Date   Chronic deep vein thrombosis (DVT) (HCC)    Chronic low back pain    Chronic systolic heart failure (HCC)    a. TTE 2011 with EF 40-45% per notes   Diabetes mellitus without complication (HCC)    Erectile dysfunction    HLD (hyperlipidemia)    Hypertension    Hypogonadism in male    Permanent atrial fibrillation (HCC)    a. s/p DCCV 2011; b. redeveloped Afib in 2016; c. CHADS2VASc at least 5 (CHF, HTN, age x 1, DM, vascular disease); d. on Xarelto   Torn meniscus    right knee    Medications:  Medications Prior to Admission  Medication Sig Dispense Refill Last Dose   amLODipine (NORVASC) 5 MG tablet Take 5 mg by mouth daily.   08/19/2021 at 0600   benazepril (LOTENSIN) 20 MG tablet Take 1 tablet (20 mg total) by mouth daily. 30 tablet 2 08/19/2021 at 0600   dapagliflozin propanediol (FARXIGA) 10 MG TABS tablet Take 1 tablet  (10 mg total) by mouth daily before breakfast. 30 tablet 6 08/19/2021 at 0600   fluticasone (FLONASE) 50 MCG/ACT nasal spray Place into both nostrils daily as needed for allergies or rhinitis.   08/19/2021 at 0600   furosemide (LASIX) 20 MG tablet TAKE 1 TABLET BY MOUTH EVERY DAY 90 tablet 3 08/19/2021 at 0600   metFORMIN (GLUCOPHAGE-XR) 500 MG 24 hr tablet Take 2 tablets (1,000 mg total) by mouth 2 (two) times daily as needed. 360 tablet 1 08/19/2021 at 0600   metoprolol tartrate (LOPRESSOR) 100 MG tablet TAKE 1 TABLET BY MOUTH TWICE A DAY 180 tablet 1 08/19/2021 at 0600   naproxen (NAPROSYN) 500 MG tablet Take 1 tablet (500 mg total) by mouth 2 (two) times daily with a meal. 60 tablet 1 08/19/2021 at 0600   rivaroxaban (XARELTO) 20 MG TABS  tablet Take 1 tablet (20 mg total) by mouth daily. 90 tablet 1 08/19/2021 at 0600   tiZANidine (ZANAFLEX) 4 MG tablet TAKE 1 TABLET BY MOUTH EVERY 6 HOURS AS NEEDED FOR MUSCLE SPASMS. 120 tablet 1 08/18/2021 at 2000   albuterol (VENTOLIN HFA) 108 (90 Base) MCG/ACT inhaler Inhale 2 puffs into the lungs every 4 (four) hours as needed for wheezing or shortness of breath. 8 g 0 unknown at prn   doxazosin (CARDURA) 2 MG tablet Take by mouth. (Patient not taking: Reported on 08/19/2021)   Not Taking   Scheduled:   amLODipine  5 mg Oral Daily   atorvastatin  10 mg Oral Daily   benazepril  20 mg Oral Daily   fluticasone  1 spray Each Nare Daily   furosemide  20 mg Oral Daily   metoprolol tartrate  100 mg Oral BID   Infusions:   [START ON 08/21/2021] sodium chloride     cefTRIAXone (ROCEPHIN)  IV 2 g (08/20/21 1613)   heparin 1,900 Units/hr (08/20/21 1800)   PRN:  Anti-infectives (From admission, onward)    Start     Dose/Rate Route Frequency Ordered Stop   08/20/21 1430  cefTRIAXone (ROCEPHIN) 2 g in sodium chloride 0.9 % 100 mL IVPB        2 g 200 mL/hr over 30 Minutes Intravenous Every 24 hours 08/20/21 1354 08/27/21 1429       Assessment: 70YOM  presenting with swelling of the left leg with coloration and redness. Patient has history of DVT with stent placement. Venous ultrasound shows evidence of chronic DVT. O2 sat 100% on RA. On rivaroxaban PTA. Hgb stable at 12.7, Plts WNL. Pharmacy consulted for heparin infusion. Heme and vascular consulted. Will use aPTT monitoring due to false elevation of anti-xa level due to the DOAC. Last dose of rivaroxaban 10/12 0600.   10/12 1909 heparin level > 1.1  10/13 0758 aPTT 53 no heparin level was drawn.  10/13 1556 aPTT 57 no heparin level was drawn 10/13 2214 aPTT 67 , therapeutic X 1    Goal of Therapy:  aPTT 66-102 seconds  Heparin level 0.3-0.7 units/ml once aPTT and heparin level correlate  Monitor platelets by anticoagulation protocol: Yes   Plan:  10/13:  aPTT @ 2214 = 67, therapeutic X 1 Will continue pt on current rate and recheck HL and aPTT on 10/14 with AM labs.   Markiah Janeway D, PharmD 08/20/2021 11:04 PM

## 2021-08-20 NOTE — Progress Notes (Addendum)
PROGRESS NOTE    Jeff [name].  OQH:[id number] DOB: 1951/07/04 DOA: 08/19/2021 PCP: Dorcas Carrow, DO    Brief Narrative:  Jeff [name]. is a 70 y.o. male seen in ed with complaints of left leg swelling that started acutely today.  Per patient report patient has a history of blood clots and has been on blood thinner for a long time.  Patient had an IVC filter that was removed in 2019 patient has had multiple angiograms angioplasty and stents to his IVC by vascular surgeon. Patient notes pain in the left leg.   10/13- speaking with pt this am, he reports wearing shorts yesterday while he was at his dermatologist office.  His dermatologist noted his left leg was more swollen than the right and told him to come for evaluation possibly for blood clot.  And he presented to the ER for evaluation.  He reported to me he has chronic left lower extremity swelling but yesterday was more swollen than usual.  He has not been wearing his compression stockings like he usually do on days he wear shorts.   Consultants:  Vascular surgery  Procedures:  Venous ultrasound: 1. Stable chronic nonocclusive post thrombotic changes in the popliteal and femoral veins. 2. No evidence of acute superimposed DVT.   Antimicrobials:      Subjective: No shortness of breath, chest pain, dizziness  Objective: Vitals:   08/20/21 0002 08/20/21 0423 08/20/21 0739 08/20/21 1143  BP: 130/68 133/88 (!) 138/94 (!) 153/92  Pulse: 89 84 75 73  Resp: 20 20 18 18   Temp: 97.9 F (36.6 C) 98.6 F (37 C) 97.8 F (36.6 C) (!) 97.1 F (36.2 C)  TempSrc: Oral  Oral   SpO2: 97% 99% 97% 100%  Weight:      Height:        Intake/Output Summary (Last 24 hours) at 08/20/2021 1518 Last data filed at 08/20/2021 1421 Gross per 24 hour  Intake 1171.69 ml  Output 2125 ml  Net -953.31 ml   Filed Weights   08/19/21 1659 08/19/21 2255  Weight: 105.2 kg 107.7 kg    Examination:  General exam: Appears calm  and comfortable  Respiratory system: Clear to auscultation. Respiratory effort normal. Cardiovascular system: S1 & S2 heard, RRR. No JVD, murmurs, rubs, gallops or clicks.  Gastrointestinal system: Abdomen is nondistended, soft and nontender Normal bowel sounds heard. Central nervous system: Alert and oriented. No focal neurological deficits. Extremities: Lt Lower extremity with chronic skin changes, +edema >>rt. Inner medial thigh with warmth, redness, and lumpy bumpy feeling, and swelling.  Psychiatry: Judgement and insight appear normal. Mood & affect appropriate.     Data Reviewed: I have personally reviewed following labs and imaging studies  CBC: Recent Labs  Lab 08/19/21 1820 08/20/21 0602  WBC 9.0 7.0  NEUTROABS 7.3  --   HGB 12.7* 11.5*  HCT 38.6* 33.9*  MCV 89.8 91.1  PLT 195 173   Basic Metabolic Panel: Recent Labs  Lab 08/19/21 1820 08/20/21 0602  NA 132* 136  K 3.3* 3.2*  CL 92* 99  CO2 25 28  GLUCOSE 129* 126*  BUN 23 19  CREATININE 1.19 1.25*  CALCIUM 8.8* 8.3*   GFR: Estimated Creatinine Clearance: 67.6 mL/min (A) (by C-G formula based on SCr of 1.25 mg/dL (H)). Liver Function Tests: Recent Labs  Lab 08/20/21 0602  AST 12*  ALT 14  ALKPHOS 52  BILITOT 1.3*  PROT 6.0*  ALBUMIN 3.1*   No results  for input(s): LIPASE, AMYLASE in the last 168 hours. No results for input(s): AMMONIA in the last 168 hours. Coagulation Profile: Recent Labs  Lab 08/19/21 1909  INR 1.8*   Cardiac Enzymes: No results for input(s): CKTOTAL, CKMB, CKMBINDEX, TROPONINI in the last 168 hours. BNP (last 3 results) No results for input(s): PROBNP in the last 8760 hours. HbA1C: No results for input(s): HGBA1C in the last 72 hours. CBG: No results for input(s): GLUCAP in the last 168 hours. Lipid Profile: No results for input(s): CHOL, HDL, LDLCALC, TRIG, CHOLHDL, LDLDIRECT in the last 72 hours. Thyroid Function Tests: No results for input(s): TSH, T4TOTAL, FREET4,  T3FREE, THYROIDAB in the last 72 hours. Anemia Panel: No results for input(s): VITAMINB12, FOLATE, FERRITIN, TIBC, IRON, RETICCTPCT in the last 72 hours. Sepsis Labs: Recent Labs  Lab 08/19/21 1821 08/19/21 2038  LATICACIDVEN 1.2 1.3    Recent Results (from the past 240 hour(s))  Resp Panel by RT-PCR (Flu A&B, Covid) Nasopharyngeal Swab     Status: None   Collection Time: 08/19/21  7:09 PM   Specimen: Nasopharyngeal Swab; Nasopharyngeal(NP) swabs in vial transport medium  Result Value Ref Range Status   SARS Coronavirus 2 by RT PCR NEGATIVE NEGATIVE Final    Comment: (NOTE) SARS-CoV-2 target nucleic acids are NOT DETECTED.  The SARS-CoV-2 RNA is generally detectable in upper respiratory specimens during the acute phase of infection. The lowest concentration of SARS-CoV-2 viral copies this assay can detect is 138 copies/mL. A negative result does not preclude SARS-Cov-2 infection and should not be used as the sole basis for treatment or other patient management decisions. A negative result may occur with  improper specimen collection/handling, submission of specimen other than nasopharyngeal swab, presence of viral mutation(s) within the areas targeted by this assay, and inadequate number of viral copies(<138 copies/mL). A negative result must be combined with clinical observations, patient history, and epidemiological information. The expected result is Negative.  Fact Sheet for Patients:  BloggerCourse.com  Fact Sheet for Healthcare Providers:  SeriousBroker.it  This test is no t yet approved or cleared by the [place] FDA and  has been authorized for detection and/or diagnosis of SARS-CoV-2 by FDA under an Emergency Use Authorization (EUA). This EUA will remain  in effect (meaning this test can be used) for the duration of the COVID-19 declaration under Section 564(b)(1) of the Act, 21 U.S.C.section 360bbb-3(b)(1),  unless the authorization is terminated  or revoked sooner.       Influenza A by PCR NEGATIVE NEGATIVE Final   Influenza B by PCR NEGATIVE NEGATIVE Final    Comment: (NOTE) The Xpert Xpress SARS-CoV-2/FLU/RSV plus assay is intended as an aid in the diagnosis of influenza from Nasopharyngeal swab specimens and should not be used as a sole basis for treatment. Nasal washings and aspirates are unacceptable for Xpert Xpress SARS-CoV-2/FLU/RSV testing.  Fact Sheet for Patients: BloggerCourse.com  Fact Sheet for Healthcare Providers: SeriousBroker.it  This test is not yet approved or cleared by the [place] FDA and has been authorized for detection and/or diagnosis of SARS-CoV-2 by FDA under an Emergency Use Authorization (EUA). This EUA will remain in effect (meaning this test can be used) for the duration of the COVID-19 declaration under Section 564(b)(1) of the Act, 21 U.S.C. section 360bbb-3(b)(1), unless the authorization is terminated or revoked.  Performed at [facility], [address] [place], [place] [address]          Radiology Studies: US Venous Img Lower Unilateral Left (  DVT)  Result Date: 08/19/2021 CLINICAL DATA:  Edema, redness, history of chronic DVT EXAM: LEFT LOWER EXTREMITY VENOUS DOPPLER ULTRASOUND TECHNIQUE: Gray-scale sonography with compression, as well as color and duplex ultrasound, were performed to evaluate the deep venous system(s) from the level of the common femoral vein through the popliteal and proximal calf veins. COMPARISON:  08/17/2018 FINDINGS: VENOUS Stable hypoechoic wall thickening in the popliteal vein and midportion of the femoral vein with incomplete compressibility but continued antegrade flow signal on color Doppler. Normal compressibility of the common femoral, as well as the visualized calf veins. Visualized portions of profunda femoral vein and great saphenous vein  unremarkable. No filling defects to suggest acute DVT on grayscale or color Doppler imaging. Doppler waveforms show normal direction of venous flow, normal respiratory phasicity and response to augmentation. Limited views of the contralateral common femoral vein are unremarkable. OTHER Subcutaneous edema in the calf. Limitations: none IMPRESSION: 1. Stable chronic nonocclusive post thrombotic changes in the popliteal and femoral veins. 2. No evidence of acute superimposed DVT. Electronically Signed   By: Corlis Leak M.D.   On: 08/19/2021 17:52        Scheduled Meds:  atorvastatin  10 mg Oral Daily   benazepril  20 mg Oral Daily   fluticasone  1 spray Each Nare Daily   furosemide  20 mg Oral Daily   metoprolol tartrate  100 mg Oral BID   Continuous Infusions:  sodium chloride 40 mL/hr at 08/20/21 0749   cefTRIAXone (ROCEPHIN)  IV     heparin 1,700 Units/hr (08/20/21 1421)    Assessment & Plan:   Principal Problem:   Left leg DVT (HCC) Active Problems:   HYPERTENSION, BENIGN   Atrial fibrillation, chronic (HCC)   Diabetes mellitus (HCC)   BPH (benign prostatic hyperplasia)   Congestive heart failure (HCC)   LLE swelling/pain History of DVT Lt thigh cellulitis Start rocephin for cellulitis Continue heparin gtt Vascular to see pt for further evaluation. Pt told me suppose to have CT done by his doctor at [facility] in 2 weeks...will discuss with vascular to see if should be obtained here for further diagnostic evaluation Patient does have evidence of post thrombotic syndrome.  Will need to wear his compression stockings as outpatient Xarelto on hold for possible vascular work-up i.e. venous   Hypertension- Has room for improvement. Will add his home amlodipine 5 mg daily dose to his current regimen   DM type II Check fingersticks R-ISS  Chronic afib  Continue metoprolol Continue heparin drip    DVT prophylaxis: Heparin drip Code Status: Full Family Communication: none  at bedside Disposition Plan:  Status is: Inpatient  Remains inpatient appropriate because: due to severity of illness and the need for IV medication treatment.  Dispo: The patient is from: Home              Anticipated d/c is to: Home              Patient currently is not medically stable to d/c.              Difficult to place patient No          LOS: 1 day   Time spent: 45 minutes with more than 50% on COC    Lynn Ito, MD [facility] Pager [phone]  If 7PM-7AM, please contact night-coverage 08/20/2021, 3:18 PM

## 2021-08-20 NOTE — Plan of Care (Signed)
  Problem: Clinical Measurements: Goal: Diagnostic test results will improve Outcome: Progressing Goal: Respiratory complications will improve Outcome: Progressing Goal: Cardiovascular complication will be avoided Outcome: Completed/Met   Problem: Activity: Goal: Risk for activity intolerance will decrease Outcome: Completed/Met   Problem: Nutrition: Goal: Adequate nutrition will be maintained Outcome: Progressing   Problem: Coping: Goal: Level of anxiety will decrease Outcome: Completed/Met   Problem: Elimination: Goal: Will not experience complications related to urinary retention Outcome: Completed/Met   Problem: Pain Managment: Goal: General experience of comfort will improve Outcome: Completed/Met   Problem: Safety: Goal: Ability to remain free from injury will improve Outcome: Completed/Met   Problem: Skin Integrity: Goal: Risk for impaired skin integrity will decrease Outcome: Completed/Met

## 2021-08-20 NOTE — Telephone Encounter (Signed)
Noted  However, pt is admitted, pt is under the care of the hospital admission team Our office cannot call to weigh in on plan of care or medication changes while under the care other providers.  If concern for cardiac issues, admission team will consult for cardiology and the cardiologist on call will consult pt.  May schedule appt to be seen after admission to discuss care and medications.

## 2021-08-20 NOTE — Progress Notes (Signed)
   08/20/21 1515  Clinical Encounter Type  Visited With Patient and family together  Visit Type Initial  Referral From Nurse  Consult/Referral To Chaplain  Spiritual Encounters  Spiritual Needs Brochure  Chaplain Keefe Zawistowski completed an AD for Jeff [name]. Mr. Breeze appointed his daughter as his Health care agent.  Auryn Paige [phone] Email:[email or web address]

## 2021-08-20 NOTE — Consult Note (Signed)
Hematology/Oncology Consult note [facility] Telephone:([phone] [phone]  Patient Care Team: Dorcas Carrow, DO as PCP - General (Family Medicine) Antonieta Iba, MD as Consulting Physician (Cardiology) Bud Face, MD as Referring Physician (Otolaryngology) Veva Holes, MD as Referring Physician (Diagnostic Radiology)   Name of the patient: Jeff [name]  [id number]  08-29-1951   Date of visit: 08/20/21 REASON FOR COSULTATION:  History of DVT History of presenting illness-  70 y.o. male with PMH listed at below who presents to ER for evaluation of left lower extremity swelling and pain.  08/19/2021, venous ultrasound showed chronic nonocclusive post thrombotic changes in the popliteal and femoral veins.  No evidence of acute superimposed DVT. Patient is currently on heparin drip.  Vascular surgeon was consulted and the plan venogram tomorrow.  He is also on Rocephin for possible cellulitis. Hematology was consulted for hypercoagulable state work-up.  Patient has extensive history of DVT/PE.  He also has atrial fibrillation.  Prior to ER presentation, patient is on Xarelto.  Extensive medical records in both [facility] EMR and Care Everywhere was performed by me.  Patient was diagnosed with atrial fibrillation in April 2011, patient was started on Coumadin for short period of time, in 04/2010 switched to Pradaxa 150 mg. 04/06/2013, he developed unprovoked DVT of left lower extremity. Unclear if he is on Pradaxa at the time of diagnosis.  S/p IVC filter placement and mechanical embolectomy and tPA thrombolysis by Dr. Gilda Crease.  At that point, he was treated with Xarelto until probably April 2018, he was noted to self discontinue Xarelto and started on aspirin 81 mg.  11/22/2017, he was found to have acute extensive predominantly occlusive DVT extending from the left common femoral through the left popliteal vein. started on heparin  drip, he was discharged on Xarelto. 12/01/2017 Dr. Gilda Crease did venogram, findings include IVC patent, thrombus within the left popliteal, superficial femoral vein and the common femoral vein.  There was thrombus both within the filter as well as above the future. 12/2017, patient went to to [facility] vascular surgery for 2nd opinion.   12/23/2017 IVC protocol CTA abdomen and pelvis with and without contrast showed showed occlusive thrombus in the IVC extending into the IVC filter and is spanning the entire length of the IVC to the IVC bifurcation,  bilateral common iliac veins, left external iliac vein, left femoral vein. Status post IVC removal at [facility]. Subsequently patient has multiple follow-up IVC protocol CT a abdomen and pelvis which showed narrowing of the IVC below the renal veins. 08/17/2018 US venous unilateral left showed no acute DVT, residual near near occlusive wall thickening/chronic DVT involving the left femoral and popliteal veins. Chronic occlusive superficial thrombophlebitis involving the interrogated portions of the left lesser saphenous vein  Her daughter Jeff [name] was at bedside.  Patient reports intentional weight loss, no other constitutional symptoms.  Family reports that his brother has history of blood clots.   Review of Systems  Constitutional:  Negative for appetite change, chills, fatigue, fever and unexpected weight change.  HENT:   Negative for hearing loss and voice change.   Eyes:  Negative for eye problems and icterus.  Respiratory:  Negative for chest tightness, cough and shortness of breath.   Cardiovascular:  Negative for chest pain and leg swelling.  Gastrointestinal:  Negative for abdominal distention and abdominal pain.  Endocrine: Negative for hot flashes.  Genitourinary:  Negative for difficulty urinating, dysuria and frequency.   Musculoskeletal:  Negative for arthralgias.  Left leg swelling, erythema and pain.   Skin:  Negative for itching and rash.   Neurological:  Negative for light-headedness and numbness.  Hematological:  Negative for adenopathy. Does not bruise/bleed easily.  Psychiatric/Behavioral:  Negative for confusion.    No Known Allergies  Patient Active Problem List   Diagnosis Date Noted   Left leg DVT (HCC) 08/19/2021   Congestive heart failure (HCC) 06/29/2021   Flank pain 06/29/2021   Hyperkalemia 01/07/2020   Proteinuria 01/07/2020   Stage 3a chronic kidney disease (HCC) 01/07/2020   Hypertrophy of both inferior nasal turbinates 04/04/2019   Elevated hemoglobin (HCC) 09/07/2018   Inferior vena caval stenosis 05/22/2018   IVC thrombosis (HCC) 01/04/2018   Morbid obesity (HCC) 01/04/2018   Chronic sinusitis 12/29/2017   History of DVT (deep vein thrombosis) 12/17/2017   Acute deep vein thrombosis (DVT) of femoral vein of left lower extremity (HCC) 11/28/2017   BPH (benign prostatic hyperplasia) 12/20/2016   Advanced care planning/counseling discussion    Erectile dysfunction 03/29/2015   Hypogonadism in male 03/29/2015   Mixed hyperlipidemia 02/02/2013   Diabetes mellitus (HCC) 02/02/2013   DVT of lower limb, acute (HCC) 11/08/2012   HYPERTENSION, BENIGN 02/23/2010   Congestive dilated cardiomyopathy (HCC) 02/23/2010   Atrial fibrillation, chronic (HCC) 02/20/2010     Past Medical History:  Diagnosis Date   Chronic deep vein thrombosis (DVT) (HCC)    Chronic low back pain    Chronic systolic heart failure (HCC)    a. TTE 2011 with EF 40-45% per notes   Diabetes mellitus without complication [facility])    Erectile dysfunction    HLD (hyperlipidemia)    Hypertension    Hypogonadism in male    Permanent atrial fibrillation (HCC)    a. s/p DCCV 2011; b. redeveloped Afib in 2016; c. CHADS2VASc at least 5 (CHF, HTN, age x 1, DM, vascular disease); d. on Xarelto   Torn meniscus    right knee     Past Surgical History:  Procedure Laterality Date   COLONOSCOPY WITH PROPOFOL N/A 05/28/2016   Procedure:  COLONOSCOPY WITH PROPOFOL;  Surgeon: Midge Minium, MD;  Location: [facility];  Service: Endoscopy;  Laterality: N/A;  diabetic - oral meds   DVT, leg Left    INSERTION OF VENA CAVA FILTER  2014   KNEE ARTHROSCOPY WITH MEDIAL MENISECTOMY Right 05/31/2019   Procedure: KNEE ARTHROSCOPY WITH partial  MEDIAL MENISECTOMY;  Surgeon: Signa Kell, MD;  Location: [facility];  Service: Orthopedics;  Laterality: Right;   PERIPHERAL VASCULAR THROMBECTOMY Left 12/01/2017   Procedure: PERIPHERAL VASCULAR THROMBECTOMY;  Surgeon: Renford Dills, MD;  Location: [facility];  Service: Cardiovascular;  Laterality: Left;    Social History   Socioeconomic History   Marital status: Single    Spouse name: Not on file   Number of children: Not on file   Years of education: Not on file   Highest education level: Some college, no degree  Occupational History   Occupation: [employer]   Tobacco Use   Smoking status: Never   Smokeless tobacco: Never  Vaping Use   Vaping Use: Never used  Substance and Sexual Activity   Alcohol use: No    Alcohol/week: 0.0 standard drinks   Drug use: No   Sexual activity: Yes  Other Topics Concern   Not on file  Social History Narrative   Former Emergency planning/management officer    Independent at baseline. [employer]   Works at [employer]  Social Determinants of Health   Financial Resource Strain: Low Risk    Difficulty of Paying Living Expenses: Not hard at all  Food Insecurity: No Food Insecurity   Worried About Programme researcher, broadcasting/film/video in the Last Year: Never true   Ran Out of Food in the Last Year: Never true  Transportation Needs: No Transportation Needs   Lack of Transportation (Medical): No   Lack of Transportation (Non-Medical): No  Physical Activity: Inactive   Days of Exercise per Week: 0 days   Minutes of Exercise per Session: 0 min  Stress: No Stress Concern Present   Feeling of Stress : Not at all  Social Connections: Not on file   Intimate Partner Violence: Not on file     Family History  Problem Relation Age of Onset   Lymphoma Mother    Heart failure Father    Diabetes Father    Atrial fibrillation Brother    Diabetes Mellitus II Other    Heart attack Neg Hx    Hypertension Neg Hx    Cancer Neg Hx    COPD Neg Hx    Stroke Neg Hx    Prostate cancer Neg Hx    Kidney cancer Neg Hx    Bladder Cancer Neg Hx      Current Facility-Administered Medications:    0.9 %  sodium chloride infusion, , Intravenous, Continuous, Gertha Calkin, MD, Stopped at 08/20/21 1613   [START ON 08/21/2021] 0.9 %  sodium chloride infusion, , Intravenous, Continuous, Stegmayer, Kimberly A, PA-C   albuterol (PROVENTIL) (2.5 MG/3ML) 0.083% nebulizer solution 3 mL, 3 mL, Nebulization, Q4H PRN, Gertha Calkin, MD   amLODipine (NORVASC) tablet 5 mg, 5 mg, Oral, Daily, Marylu Lund, Sahar, MD, 5 mg at 08/20/21 1610   atorvastatin (LIPITOR) tablet 10 mg, 10 mg, Oral, Daily, Irena Cords V, MD, 10 mg at 08/20/21 0932   benazepril (LOTENSIN) tablet 20 mg, 20 mg, Oral, Daily, Irena Cords V, MD, 20 mg at 08/20/21 1043   cefTRIAXone (ROCEPHIN) 2 g in sodium chloride 0.9 % 100 mL IVPB, 2 g, Intravenous, Q24H, Amery, Sahar, MD, Last Rate: 200 mL/hr at 08/20/21 1613, 2 g at 08/20/21 1613   fluticasone (FLONASE) 50 MCG/ACT nasal spray 1 spray, 1 spray, Each Nare, Daily, Irena Cords V, MD, 1 spray at 08/20/21 0933   furosemide (LASIX) tablet 20 mg, 20 mg, Oral, Daily, Irena Cords V, MD, 20 mg at 08/20/21 0932   guaiFENesin-dextromethorphan (ROBITUSSIN DM) 100-10 MG/5ML syrup 5 mL, 5 mL, Oral, Q4H PRN, Gertha Calkin, MD, 5 mL at 08/20/21 0442   heparin ADULT infusion 100 units/mL (25000 units/269mL), 1,900 Units/hr, Intravenous, Continuous, Deal, Adonis Housekeeper, RPH, Last Rate: 17 mL/hr at 08/20/21 1421, 1,700 Units/hr at 08/20/21 1421   heparin bolus via infusion 1,500 Units, 1,500 Units, Intravenous, Once, Deal, Adonis Housekeeper, RPH   metoprolol tartrate (LOPRESSOR)  tablet 100 mg, 100 mg, Oral, BID, Irena Cords V, MD, 100 mg at 08/20/21 0932   Physical exam:  Vitals:   08/20/21 0423 08/20/21 0739 08/20/21 1143 08/20/21 1541  BP: 133/88 (!) 138/94 (!) 153/92 (!) 162/82  Pulse: 84 75 73 89  Resp: 20 18 18 18   Temp: 98.6 F (37 C) 97.8 F (36.6 C) (!) 97.1 F (36.2 C) 97.9 F (36.6 C)  TempSrc:  Oral  Oral  SpO2: 99% 97% 100% 99%  Weight:      Height:       Physical Exam Constitutional:  General: He is not in acute distress.    Appearance: He is obese. He is not diaphoretic.  HENT:     Head: Normocephalic and atraumatic.     Nose: Nose normal.     Mouth/Throat:     Pharynx: No oropharyngeal exudate.  Eyes:     General: No scleral icterus.    Pupils: Pupils are equal, round, and reactive to light.  Cardiovascular:     Rate and Rhythm: Normal rate and regular rhythm.     Heart sounds: No murmur heard. Pulmonary:     Effort: Pulmonary effort is normal. No respiratory distress.     Breath sounds: No rales.  Chest:     Chest wall: No tenderness.  Abdominal:     General: There is no distension.     Palpations: Abdomen is soft.     Tenderness: There is no abdominal tenderness.  Musculoskeletal:        General: Swelling present. Normal range of motion.     Cervical back: Normal range of motion and neck supple.     Left lower leg: Edema present.     Comments: LLE swelling redness and tenderness  Skin:    General: Skin is warm and dry.     Findings: No erythema.  Neurological:     Mental Status: He is alert and oriented to person, place, and time.     Cranial Nerves: No cranial nerve deficit.     Motor: No abnormal muscle tone.     Coordination: Coordination normal.  Psychiatric:        Mood and Affect: Affect normal.        CMP Latest Ref Rng & Units 08/20/2021  Glucose 70 - 99 mg/dL 676(H)  BUN 8 - 23 mg/dL 19  Creatinine 2.09 - 4.70 mg/dL 9.62(E)  Sodium 366 - 294 mmol/L 136  Potassium 3.5 - 5.1 mmol/L 3.2(L)   Chloride 98 - 111 mmol/L 99  CO2 22 - 32 mmol/L 28  Calcium 8.9 - 10.3 mg/dL 8.3(L)  Total Protein 6.5 - 8.1 g/dL 6.0(L)  Total Bilirubin 0.3 - 1.2 mg/dL 7.6(L)  Alkaline Phos 38 - 126 U/L 52  AST 15 - 41 U/L 12(L)  ALT 0 - 44 U/L 14   CBC Latest Ref Rng & Units 08/20/2021  WBC 4.0 - 10.5 K/uL 7.0  Hemoglobin 13.0 - 17.0 g/dL 11.5(L)  Hematocrit 39.0 - 52.0 % 33.9(L)  Platelets 150 - 400 K/uL 173    RADIOGRAPHIC STUDIES: I have personally reviewed the radiological images as listed and agreed with the findings in the report. US Venous Img Lower Unilateral Left (DVT)  Result Date: 08/19/2021 CLINICAL DATA:  Edema, redness, history of chronic DVT EXAM: LEFT LOWER EXTREMITY VENOUS DOPPLER ULTRASOUND TECHNIQUE: Gray-scale sonography with compression, as well as color and duplex ultrasound, were performed to evaluate the deep venous system(s) from the level of the common femoral vein through the popliteal and proximal calf veins. COMPARISON:  08/17/2018 FINDINGS: VENOUS Stable hypoechoic wall thickening in the popliteal vein and midportion of the femoral vein with incomplete compressibility but continued antegrade flow signal on color Doppler. Normal compressibility of the common femoral, as well as the visualized calf veins. Visualized portions of profunda femoral vein and great saphenous vein unremarkable. No filling defects to suggest acute DVT on grayscale or color Doppler imaging. Doppler waveforms show normal direction of venous flow, normal respiratory phasicity and response to augmentation. Limited views of the contralateral common femoral vein are unremarkable. OTHER  Subcutaneous edema in the calf. Limitations: none IMPRESSION: 1. Stable chronic nonocclusive post thrombotic changes in the popliteal and femoral veins. 2. No evidence of acute superimposed DVT. Electronically Signed   By: Corlis Leak M.D.   On: 08/19/2021 17:52    Assessment and plan-   # Left lower extremity chronic DVT,  post thrombotic syndrome, IVC stenosis with IVC stent Venogram tomorrow by vascular surgeon for evaluation patency of IVC Continue Heparin gtt.  No acute DVT to call Xarelto failure unless new findings on venogram.  Check hypercoagulable work and he can follow up with me outpatient to go over results.    Thank you for allowing me to participate in the care of this patient.   Rickard Patience, MD, PhD 08/20/2021

## 2021-08-20 NOTE — Telephone Encounter (Signed)
Patient and daughter calling to discuss current admission and medication changes   They would like approval from Dr. Mariah Milling and to discuss poc and if needing to request a hospital transfer.   Patient and daughter aware to discuss a cardiology consult with nurse and attending at hospital.    They would also like a call back from [facility] or nurse to discuss what to do.    Please call asap.

## 2021-08-20 NOTE — Consult Note (Signed)
[facility] Vascular Consult Note  MRN : [id number]  Jeff [name]. is a 70 y.o. (1950/12/02) male who presents with chief complaint of  Chief Complaint  Patient presents with   dvt   History of Present Illness:  Jeff [name]. is a 70 year old male seen in ED with complaints of left leg swelling that started acutely today.   Per patient report, he has a history of blood clots to the left lower extremity and has been compliant with taking his Xarelto.  Patient had an IVC filter that was removed in 2019 patient has had multiple angiograms and subsequent angioplasty and stents to his IVC.   Patient notes pain in the left leg.  Started acutely onset today, does not remember any trauma.  Denies chest pain, blurred vision, nausea, vomiting, headaches, speech or gait issue. Pt has always been on xarelto for his blood clots.  Patient endorses a history of left leg swelling, warmth, and erythema. Pain is 6/10, dull ache , worse with movement and better with rest. [place] two hours trip each way.   Venous Duplex (08/19/21):  1. Stable chronic nonocclusive post thrombotic changes in the popliteal and femoral veins. 2. No evidence of acute superimposed DVT.  Vascular surgery was consulted by Dr. Katrinka Blazing for possible intervention in the setting of recurrent deep vein thrombosis.  Current Facility-Administered Medications  Medication Dose Route Frequency Provider Last Rate Last Admin   0.9 %  sodium chloride infusion   Intravenous Continuous Gertha Calkin, MD 40 mL/hr at 08/20/21 0749 Infusion Verify at 08/20/21 0749   albuterol (PROVENTIL) (2.5 MG/3ML) 0.083% nebulizer solution 3 mL  3 mL Nebulization Q4H PRN Gertha Calkin, MD       atorvastatin (LIPITOR) tablet 10 mg  10 mg Oral Daily Irena Cords V, MD   10 mg at 08/20/21 0932   benazepril (LOTENSIN) tablet 20 mg  20 mg Oral Daily Irena Cords V, MD   20 mg at 08/20/21 1043   fluticasone (FLONASE) 50  MCG/ACT nasal spray 1 spray  1 spray Each Nare Daily Gertha Calkin, MD   1 spray at 08/20/21 0933   furosemide (LASIX) tablet 20 mg  20 mg Oral Daily Irena Cords V, MD   20 mg at 08/20/21 0932   guaiFENesin-dextromethorphan (ROBITUSSIN DM) 100-10 MG/5ML syrup 5 mL  5 mL Oral Q4H PRN Gertha Calkin, MD   5 mL at 08/20/21 0442   heparin ADULT infusion 100 units/mL (25000 units/242mL)  1,700 Units/hr Intravenous Continuous Ronnald Ramp, RPH 17 mL/hr at 08/20/21 0930 1,700 Units/hr at 08/20/21 0930   metoprolol tartrate (LOPRESSOR) tablet 100 mg  100 mg Oral BID Gertha Calkin, MD   100 mg at 08/20/21 0932   Past Medical History:  Diagnosis Date   Chronic deep vein thrombosis (DVT) (HCC)    Chronic low back pain    Chronic systolic heart failure (HCC)    a. TTE 2011 with EF 40-45% per notes   Diabetes mellitus without complication [facility])    Erectile dysfunction    HLD (hyperlipidemia)    Hypertension    Hypogonadism in male    Permanent atrial fibrillation (HCC)    a. s/p DCCV 2011; b. redeveloped Afib in 2016; c. CHADS2VASc at least 5 (CHF, HTN, age x 1, DM, vascular disease); d. on Xarelto   Torn meniscus    right knee   Past Surgical History:  Procedure  Laterality Date   COLONOSCOPY WITH PROPOFOL N/A 05/28/2016   Procedure: COLONOSCOPY WITH PROPOFOL;  Surgeon: Midge Minium, MD;  Location: [facility];  Service: Endoscopy;  Laterality: N/A;  diabetic - oral meds   DVT, leg Left    INSERTION OF VENA CAVA FILTER  2014   KNEE ARTHROSCOPY WITH MEDIAL MENISECTOMY Right 05/31/2019   Procedure: KNEE ARTHROSCOPY WITH partial  MEDIAL MENISECTOMY;  Surgeon: Signa Kell, MD;  Location: [facility];  Service: Orthopedics;  Laterality: Right;   PERIPHERAL VASCULAR THROMBECTOMY Left 12/01/2017   Procedure: PERIPHERAL VASCULAR THROMBECTOMY;  Surgeon: Renford Dills, MD;  Location: [facility] INVASIVE CV LAB;  Service: Cardiovascular;  Laterality: Left;   Social History Social History    Tobacco Use   Smoking status: Never   Smokeless tobacco: Never  Vaping Use   Vaping Use: Never used  Substance Use Topics   Alcohol use: No    Alcohol/week: 0.0 standard drinks   Drug use: No   Family History Family History  Problem Relation Age of Onset   Lymphoma Mother    Heart failure Father    Diabetes Father    Atrial fibrillation Brother    Diabetes Mellitus II Other    Heart attack Neg Hx    Hypertension Neg Hx    Cancer Neg Hx    COPD Neg Hx    Stroke Neg Hx    Prostate cancer Neg Hx    Kidney cancer Neg Hx    Bladder Cancer Neg Hx   Denies family history of peripheral artery disease, renal disease or bleeding/clotting disorders  No Known Allergies  REVIEW OF SYSTEMS (Negative unless checked)  Constitutional: [] Weight loss  [] Fever  [] Chills Cardiac: [] Chest pain   [] Chest pressure   [] Palpitations   [] Shortness of breath when laying flat   [] Shortness of breath at rest   [] Shortness of breath with exertion. Vascular:  [x] Pain in legs with walking   [x] Pain in legs at rest   [] Pain in legs when laying flat   [] Claudication   [] Pain in feet when walking  [] Pain in feet at rest  [] Pain in feet when laying flat   [] History of DVT   [] Phlebitis   [x] Swelling in legs   [] Varicose veins   [] Non-healing ulcers Pulmonary:   [] Uses home oxygen   [] Productive cough   [] Hemoptysis   [] Wheeze  [] COPD   [] Asthma Neurologic:  [] Dizziness  [] Blackouts   [] Seizures   [] History of stroke   [] History of TIA  [] Aphasia   [] Temporary blindness   [] Dysphagia   [] Weakness or numbness in arms   [] Weakness or numbness in legs Musculoskeletal:  [] Arthritis   [] Joint swelling   [] Joint pain   [] Low back pain Hematologic:  [] Easy bruising  [] Easy bleeding   [] Hypercoagulable state   [] Anemic  [] Hepatitis Gastrointestinal:  [] Blood in stool   [] Vomiting blood  [] Gastroesophageal reflux/heartburn   [] Difficulty swallowing. Genitourinary:  [x] Chronic kidney disease   [] Difficult urination   [] Frequent urination  [] Burning with urination   [] Blood in urine Skin:  [] Rashes   [] Ulcers   [] Wounds Psychological:  [] History of anxiety   []  History of major depression.  Physical Examination  Vitals:   08/20/21 0002 08/20/21 0423 08/20/21 0739 08/20/21 1143  BP: 130/68 133/88 (!) 138/94 (!) 153/92  Pulse: 89 84 75 73  Resp: 20 20 18 18   Temp: 97.9 F (36.6 C) 98.6 F (37 C) 97.8 F (36.6 C) (!) 97.1 F (36.2 C)  TempSrc:  Oral  Oral   SpO2: 97% 99% 97% 100%  Weight:      Height:       Body mass index is 34.08 kg/m. Gen:  WD/WN, NAD Head: [place]/AT, No temporalis wasting. Prominent temp pulse not noted. Ear/Nose/Throat: Hearing grossly intact, nares w/o erythema or drainage, oropharynx w/o Erythema/Exudate Eyes: Sclera non-icteric, conjunctiva clear Neck: Trachea midline.  No JVD.  Pulmonary:  Good air movement, respirations not labored, equal bilaterally.  Cardiac: RRR, normal S1, S2. Vascular:  Vessel Right Left  Radial Palpable Palpable  Ulnar Palpable Palpable  Brachial Palpable Palpable  Carotid Palpable, without bruit Palpable, without bruit  Aorta Not palpable N/A  Femoral Palpable Palpable  Popliteal Palpable Palpable  PT Palpable Palpable  DP Palpable Palpable   Left lower extremity:  Gastrointestinal: soft, non-tender/non-distended. No guarding/reflex.  Musculoskeletal: M/S 5/5 throughout.  Extremities without ischemic changes.  No deformity or atrophy. No edema. Neurologic: Sensation grossly intact in extremities.  Symmetrical.  Speech is fluent. Motor exam as listed above. Psychiatric: Judgment intact, Mood & affect appropriate for pt's clinical situation. Dermatologic: No rashes or ulcers noted.  No cellulitis or open wounds. Lymph : No Cervical, Axillary, or Inguinal lymphadenopathy.  CBC Lab Results  Component Value Date   WBC 7.0 08/20/2021   HGB 11.5 (L) 08/20/2021   HCT 33.9 (L) 08/20/2021   MCV 91.1 08/20/2021   PLT 173 08/20/2021    BMET    Component Value Date/Time   NA 136 08/20/2021 0602   NA 136 07/16/2021 1717   NA 140 04/06/2013 1129   K 3.2 (L) 08/20/2021 0602   K 4.0 04/06/2013 1129   CL 99 08/20/2021 0602   CL 105 04/06/2013 1129   CO2 28 08/20/2021 0602   CO2 23 04/06/2013 1129   GLUCOSE 126 (H) 08/20/2021 0602   GLUCOSE 132 (H) 04/06/2013 1129   BUN 19 08/20/2021 0602   BUN 34 (H) 07/16/2021 1717   BUN 12 04/06/2013 1129   CREATININE 1.25 (H) 08/20/2021 0602   CREATININE 0.83 04/06/2013 1129   CALCIUM 8.3 (L) 08/20/2021 0602   CALCIUM 9.0 04/06/2013 1129   GFRNONAA >60 08/20/2021 0602   GFRNONAA >60 04/06/2013 1129   GFRAA 63 03/13/2020 1510   GFRAA >60 04/06/2013 1129   Estimated Creatinine Clearance: 67.6 mL/min (A) (by C-G formula based on SCr of 1.25 mg/dL (H)).  COAG Lab Results  Component Value Date   INR 1.8 (H) 08/19/2021   INR 1.4 (H) 08/11/2019   INR 1.53 08/16/2018   Radiology US Venous Img Lower Unilateral Left (DVT)  Result Date: 08/19/2021 CLINICAL DATA:  Edema, redness, history of chronic DVT EXAM: LEFT LOWER EXTREMITY VENOUS DOPPLER ULTRASOUND TECHNIQUE: Gray-scale sonography with compression, as well as color and duplex ultrasound, were performed to evaluate the deep venous system(s) from the level of the common femoral vein through the popliteal and proximal calf veins. COMPARISON:  08/17/2018 FINDINGS: VENOUS Stable hypoechoic wall thickening in the popliteal vein and midportion of the femoral vein with incomplete compressibility but continued antegrade flow signal on color Doppler. Normal compressibility of the common femoral, as well as the visualized calf veins. Visualized portions of profunda femoral vein and great saphenous vein unremarkable. No filling defects to suggest acute DVT on grayscale or color Doppler imaging. Doppler waveforms show normal direction of venous flow, normal respiratory phasicity and response to augmentation. Limited views of the contralateral  common femoral vein are unremarkable. OTHER Subcutaneous edema in the calf. Limitations: none IMPRESSION: 1.  Stable chronic nonocclusive post thrombotic changes in the popliteal and femoral veins. 2. No evidence of acute superimposed DVT. Electronically Signed   By: Corlis Leak M.D.   On: 08/19/2021 17:52    Assessment/Plan The patient is a 70 year old male with known history of lower extremity deep vein thrombosis status post multiple endovascular interventions including venograms as well as IVC filter placement and subsequent removal who presents with the possibility of a new blood clot in the left lower extremity.  1.  Deep Vein Thrombosis: Patient presented to the [facility] emergency department with a chief complaint of possible left leg DVT after he saw his dermatologist.  Ultrasound completed at [facility] was notable for a chronic nonocclusive left lower extremity DVT.  Patient's onset of symptoms as per his daughter who is at the bedside occurred within approximately 6 hours.  Sounds more acute versus chronic symptomology.  Recommend the patient undergo a left lower extremity venogram and attempt to assess his anatomy (assess acute versus chronic, possible iliac stenosis, possible IVC stenosis).  If appropriate, attempt to revascularize the leg 3 angioplasty or additional stent placement can be made.  We did discuss the possibility of placing an inferior vena cava filter as well as possibility of changing blood thinning medication from Xarelto to something else.  We will plan on a left lower extremity venogram possible intervention possible IVC filter placement tomorrow with Dr. Gilda Crease procedure, risks and benefits were explained with the patient and his family at the bedside.  All questions were answered.  Patient wishes to move forward.  He was initiated on heparin.  2.  Atrial Fibrillation: Patient of Dr. Mariah Milling Patient's daughter would like his cardiologist Dr. Mariah Milling in the  decision-making process during his inpatient stay.  3.  Chronic Kidney Disease: Creatinine today 1.25 Patient will be gently rehydrated throughout the day and overnight into tomorrow We will try to use the least amount of contrast possible  Discussed with Dr. Romie Jumper, PA-C 08/20/2021 12:27 PM  This note was created with Dragon medical transcription system.  Any error is purely unintentional.

## 2021-08-20 NOTE — H&P (View-Only) (Signed)
[facility] Vascular Consult Note  MRN : [id number]  Jeff Thomann Davier Tramell. is a 70 y.o. (1950/12/02) male who presents with chief complaint of  Chief Complaint  Patient presents with   dvt   History of Present Illness:  Jeff [name]. is a 70 year old male seen in ED with complaints of left leg swelling that started acutely today.   Per patient report, he has a history of blood clots to the left lower extremity and has been compliant with taking his Xarelto.  Patient had an IVC filter that was removed in 2019 patient has had multiple angiograms and subsequent angioplasty and stents to his IVC.   Patient notes pain in the left leg.  Started acutely onset today, does not remember any trauma.  Denies chest pain, blurred vision, nausea, vomiting, headaches, speech or gait issue. Pt has always been on xarelto for his blood clots.  Patient endorses a history of left leg swelling, warmth, and erythema. Pain is 6/10, dull ache , worse with movement and better with rest. [place] a month [place] two hours trip each way.   Venous Duplex (08/19/21):  1. Stable chronic nonocclusive post thrombotic changes in the popliteal and femoral veins. 2. No evidence of acute superimposed DVT.  Vascular surgery was consulted by Dr. Katrinka Blazing for possible intervention in the setting of recurrent deep vein thrombosis.  Current Facility-Administered Medications  Medication Dose Route Frequency Provider Last Rate Last Admin   0.9 %  sodium chloride infusion   Intravenous Continuous Gertha Calkin, MD 40 mL/hr at 08/20/21 0749 Infusion Verify at 08/20/21 0749   albuterol (PROVENTIL) (2.5 MG/3ML) 0.083% nebulizer solution 3 mL  3 mL Nebulization Q4H PRN Gertha Calkin, MD       atorvastatin (LIPITOR) tablet 10 mg  10 mg Oral Daily Irena Cords V, MD   10 mg at 08/20/21 0932   benazepril (LOTENSIN) tablet 20 mg  20 mg Oral Daily Irena Cords V, MD   20 mg at 08/20/21 1043   fluticasone (FLONASE) 50  MCG/ACT nasal spray 1 spray  1 spray Each Nare Daily Gertha Calkin, MD   1 spray at 08/20/21 0933   furosemide (LASIX) tablet 20 mg  20 mg Oral Daily Irena Cords V, MD   20 mg at 08/20/21 0932   guaiFENesin-dextromethorphan (ROBITUSSIN DM) 100-10 MG/5ML syrup 5 mL  5 mL Oral Q4H PRN Gertha Calkin, MD   5 mL at 08/20/21 0442   heparin ADULT infusion 100 units/mL (25000 units/242mL)  1,700 Units/hr Intravenous Continuous Ronnald Ramp, RPH 17 mL/hr at 08/20/21 0930 1,700 Units/hr at 08/20/21 0930   metoprolol tartrate (LOPRESSOR) tablet 100 mg  100 mg Oral BID Gertha Calkin, MD   100 mg at 08/20/21 0932   Past Medical History:  Diagnosis Date   Chronic deep vein thrombosis (DVT) (HCC)    Chronic low back pain    Chronic systolic heart failure (HCC)    a. TTE 2011 with EF 40-45% per notes   Diabetes mellitus without complication [facility])    Erectile dysfunction    HLD (hyperlipidemia)    Hypertension    Hypogonadism in male    Permanent atrial fibrillation (HCC)    a. s/p DCCV 2011; b. redeveloped Afib in 2016; c. CHADS2VASc at least 5 (CHF, HTN, age x 1, DM, vascular disease); d. on Xarelto   Torn meniscus    right knee   Past Surgical History:  Procedure  Laterality Date   COLONOSCOPY WITH PROPOFOL N/A 05/28/2016   Procedure: COLONOSCOPY WITH PROPOFOL;  Surgeon: Darren Wohl, MD;  Location: [facility];  Service: Endoscopy;  Laterality: N/A;  diabetic - oral meds   DVT, leg Left    INSERTION OF VENA CAVA FILTER  2014   KNEE ARTHROSCOPY WITH MEDIAL MENISECTOMY Right 05/31/2019   Procedure: KNEE ARTHROSCOPY WITH partial  MEDIAL MENISECTOMY;  Surgeon: Patel, Sunny, MD;  Location: [facility];  Service: Orthopedics;  Laterality: Right;   PERIPHERAL VASCULAR THROMBECTOMY Left 12/01/2017   Procedure: PERIPHERAL VASCULAR THROMBECTOMY;  Surgeon: Schnier, Gregory G, MD;  Location: [facility] INVASIVE CV LAB;  Service: Cardiovascular;  Laterality: Left;   Social History Social History    Tobacco Use   Smoking status: Never   Smokeless tobacco: Never  Vaping Use   Vaping Use: Never used  Substance Use Topics   Alcohol use: No    Alcohol/week: 0.0 standard drinks   Drug use: No   Family History Family History  Problem Relation Age of Onset   Lymphoma Mother    Heart failure Father    Diabetes Father    Atrial fibrillation Brother    Diabetes Mellitus II Other    Heart attack Neg Hx    Hypertension Neg Hx    Cancer Neg Hx    COPD Neg Hx    Stroke Neg Hx    Prostate cancer Neg Hx    Kidney cancer Neg Hx    Bladder Cancer Neg Hx   Denies family history of peripheral artery disease, renal disease or bleeding/clotting disorders  No Known Allergies  REVIEW OF SYSTEMS (Negative unless checked)  Constitutional: []Weight loss  []Fever  []Chills Cardiac: []Chest pain   []Chest pressure   []Palpitations   []Shortness of breath when laying flat   []Shortness of breath at rest   []Shortness of breath with exertion. Vascular:  [x]Pain in legs with walking   [x]Pain in legs at rest   []Pain in legs when laying flat   []Claudication   []Pain in feet when walking  []Pain in feet at rest  []Pain in feet when laying flat   []History of DVT   []Phlebitis   [x]Swelling in legs   []Varicose veins   []Non-healing ulcers Pulmonary:   []Uses home oxygen   []Productive cough   []Hemoptysis   []Wheeze  []COPD   []Asthma Neurologic:  []Dizziness  []Blackouts   []Seizures   []History of stroke   []History of TIA  []Aphasia   []Temporary blindness   []Dysphagia   []Weakness or numbness in arms   []Weakness or numbness in legs Musculoskeletal:  []Arthritis   []Joint swelling   []Joint pain   []Low back pain Hematologic:  []Easy bruising  []Easy bleeding   []Hypercoagulable state   []Anemic  []Hepatitis Gastrointestinal:  []Blood in stool   []Vomiting blood  []Gastroesophageal reflux/heartburn   []Difficulty swallowing. Genitourinary:  [x]Chronic kidney disease   []Difficult urination   []Frequent urination  []Burning with urination   []Blood in urine Skin:  []Rashes   []Ulcers   []Wounds Psychological:  []History of anxiety   [] History of major depression.  Physical Examination  Vitals:   08/20/21 0002 08/20/21 0423 08/20/21 0739 08/20/21 1143  BP: 130/68 133/88 (!) 138/94 (!) 153/92  Pulse: 89 84 75 73  Resp: 20 20 18 18  Temp: 97.9 F (36.6 C) 98.6 F (37 C) 97.8 F (36.6 C) (!) 97.1 F (36.2 C)  TempSrc:   Oral  Oral   SpO2: 97% 99% 97% 100%  Weight:      Height:       Body mass index is 34.08 kg/m. Gen:  WD/WN, NAD Head: [place]/AT, No temporalis wasting. Prominent temp pulse not noted. Ear/Nose/Throat: Hearing grossly intact, nares w/o erythema or drainage, oropharynx w/o Erythema/Exudate Eyes: Sclera non-icteric, conjunctiva clear Neck: Trachea midline.  No JVD.  Pulmonary:  Good air movement, respirations not labored, equal bilaterally.  Cardiac: RRR, normal S1, S2. Vascular:  Vessel Right Left  Radial Palpable Palpable  Ulnar Palpable Palpable  Brachial Palpable Palpable  Carotid Palpable, without bruit Palpable, without bruit  Aorta Not palpable N/A  Femoral Palpable Palpable  Popliteal Palpable Palpable  PT Palpable Palpable  DP Palpable Palpable   Left lower extremity:  Gastrointestinal: soft, non-tender/non-distended. No guarding/reflex.  Musculoskeletal: M/S 5/5 throughout.  Extremities without ischemic changes.  No deformity or atrophy. No edema. Neurologic: Sensation grossly intact in extremities.  Symmetrical.  Speech is fluent. Motor exam as listed above. Psychiatric: Judgment intact, Mood & affect appropriate for pt's clinical situation. Dermatologic: No rashes or ulcers noted.  No cellulitis or open wounds. Lymph : No Cervical, Axillary, or Inguinal lymphadenopathy.  CBC Lab Results  Component Value Date   WBC 7.0 08/20/2021   HGB 11.5 (L) 08/20/2021   HCT 33.9 (L) 08/20/2021   MCV 91.1 08/20/2021   PLT 173 08/20/2021    BMET    Component Value Date/Time   NA 136 08/20/2021 0602   NA 136 07/16/2021 1717   NA 140 04/06/2013 1129   K 3.2 (L) 08/20/2021 0602   K 4.0 04/06/2013 1129   CL 99 08/20/2021 0602   CL 105 04/06/2013 1129   CO2 28 08/20/2021 0602   CO2 23 04/06/2013 1129   GLUCOSE 126 (H) 08/20/2021 0602   GLUCOSE 132 (H) 04/06/2013 1129   BUN 19 08/20/2021 0602   BUN 34 (H) 07/16/2021 1717   BUN 12 04/06/2013 1129   CREATININE 1.25 (H) 08/20/2021 0602   CREATININE 0.83 04/06/2013 1129   CALCIUM 8.3 (L) 08/20/2021 0602   CALCIUM 9.0 04/06/2013 1129   GFRNONAA >60 08/20/2021 0602   GFRNONAA >60 04/06/2013 1129   GFRAA 63 03/13/2020 1510   GFRAA >60 04/06/2013 1129   Estimated Creatinine Clearance: 67.6 mL/min (A) (by C-G formula based on SCr of 1.25 mg/dL (H)).  COAG Lab Results  Component Value Date   INR 1.8 (H) 08/19/2021   INR 1.4 (H) 08/11/2019   INR 1.53 08/16/2018   Radiology US Venous Img Lower Unilateral Left (DVT)  Result Date: 08/19/2021 CLINICAL DATA:  Edema, redness, history of chronic DVT EXAM: LEFT LOWER EXTREMITY VENOUS DOPPLER ULTRASOUND TECHNIQUE: Gray-scale sonography with compression, as well as color and duplex ultrasound, were performed to evaluate the deep venous system(s) from the level of the common femoral vein through the popliteal and proximal calf veins. COMPARISON:  08/17/2018 FINDINGS: VENOUS Stable hypoechoic wall thickening in the popliteal vein and midportion of the femoral vein with incomplete compressibility but continued antegrade flow signal on color Doppler. Normal compressibility of the common femoral, as well as the visualized calf veins. Visualized portions of profunda femoral vein and great saphenous vein unremarkable. No filling defects to suggest acute DVT on grayscale or color Doppler imaging. Doppler waveforms show normal direction of venous flow, normal respiratory phasicity and response to augmentation. Limited views of the contralateral  common femoral vein are unremarkable. OTHER Subcutaneous edema in the calf. Limitations: none IMPRESSION: 1.  Stable chronic nonocclusive post thrombotic changes in the popliteal and femoral veins. 2. No evidence of acute superimposed DVT. Electronically Signed   By: Corlis Leak M.D.   On: 08/19/2021 17:52    Assessment/Plan The patient is a 70 year old male with known history of lower extremity deep vein thrombosis status post multiple endovascular interventions including venograms as well as IVC filter placement and subsequent removal who presents with the possibility of a new blood clot in the left lower extremity.  1.  Deep Vein Thrombosis: Patient presented to the [facility] emergency department with a chief complaint of possible left leg DVT after he saw his dermatologist.  Ultrasound completed at [facility] was notable for a chronic nonocclusive left lower extremity DVT.  Patient's onset of symptoms as per his daughter who is at the bedside occurred within approximately 6 hours.  Sounds more acute versus chronic symptomology.  Recommend the patient undergo a left lower extremity venogram and attempt to assess his anatomy (assess acute versus chronic, possible iliac stenosis, possible IVC stenosis).  If appropriate, attempt to revascularize the leg 3 angioplasty or additional stent placement can be made.  We did discuss the possibility of placing an inferior vena cava filter as well as possibility of changing blood thinning medication from Xarelto to something else.  We will plan on a left lower extremity venogram possible intervention possible IVC filter placement tomorrow with Dr. Gilda Crease procedure, risks and benefits were explained with the patient and his family at the bedside.  All questions were answered.  Patient wishes to move forward.  He was initiated on heparin.  2.  Atrial Fibrillation: Patient of Dr. Mariah Milling Patient's daughter would like his cardiologist Dr. Mariah Milling in the  decision-making process during his inpatient stay.  3.  Chronic Kidney Disease: Creatinine today 1.25 Patient will be gently rehydrated throughout the day and overnight into tomorrow We will try to use the least amount of contrast possible  Discussed with Dr. Romie Jumper, PA-C 08/20/2021 12:27 PM  This note was created with Dragon medical transcription system.  Any error is purely unintentional.

## 2021-08-20 NOTE — Progress Notes (Addendum)
ANTICOAGULATION CONSULT NOTE   Pharmacy Consult for Heparin Indication: Hx of DVT  No Known Allergies  Patient Measurements: Height: 5\' 10"  (177.8 cm) Weight: 107.7 kg (237 lb 8 oz) IBW/kg (Calculated) : 73 Heparin Dosing Weight: 95.4 kg  Vital Signs: Temp: 97.8 F (36.6 C) (10/13 0739) Temp Source: Oral (10/13 0739) BP: 138/94 (10/13 0739) Pulse Rate: 75 (10/13 0739)  Labs: Recent Labs    08/19/21 1820 08/19/21 1909 08/20/21 0602 08/20/21 0758  HGB 12.7*  --  11.5*  --   HCT 38.6*  --  33.9*  --   PLT 195  --  173  --   APTT  --  46*  --  53*  LABPROT  --  21.2*  --   --   INR  --  1.8*  --   --   HEPARINUNFRC  --  >1.10*  --   --   CREATININE 1.19  --  1.25*  --      Estimated Creatinine Clearance: 67.6 mL/min (A) (by C-G formula based on SCr of 1.25 mg/dL (H)).   Medical History: Past Medical History:  Diagnosis Date   Chronic deep vein thrombosis (DVT) (HCC)    Chronic low back pain    Chronic systolic heart failure (HCC)    a. TTE 2011 with EF 40-45% per notes   Diabetes mellitus without complication (HCC)    Erectile dysfunction    HLD (hyperlipidemia)    Hypertension    Hypogonadism in male    Permanent atrial fibrillation (HCC)    a. s/p DCCV 2011; b. redeveloped Afib in 2016; c. CHADS2VASc at least 5 (CHF, HTN, age x 1, DM, vascular disease); d. on Xarelto   Torn meniscus    right knee    Medications:  Medications Prior to Admission  Medication Sig Dispense Refill Last Dose   amLODipine (NORVASC) 5 MG tablet Take 5 mg by mouth daily.   08/19/2021 at 0600   benazepril (LOTENSIN) 20 MG tablet Take 1 tablet (20 mg total) by mouth daily. 30 tablet 2 08/19/2021 at 0600   dapagliflozin propanediol (FARXIGA) 10 MG TABS tablet Take 1 tablet (10 mg total) by mouth daily before breakfast. 30 tablet 6 08/19/2021 at 0600   fluticasone (FLONASE) 50 MCG/ACT nasal spray Place into both nostrils daily as needed for allergies or rhinitis.   08/19/2021 at 0600    furosemide (LASIX) 20 MG tablet TAKE 1 TABLET BY MOUTH EVERY DAY 90 tablet 3 08/19/2021 at 0600   metFORMIN (GLUCOPHAGE-XR) 500 MG 24 hr tablet Take 2 tablets (1,000 mg total) by mouth 2 (two) times daily as needed. 360 tablet 1 08/19/2021 at 0600   metoprolol tartrate (LOPRESSOR) 100 MG tablet TAKE 1 TABLET BY MOUTH TWICE A DAY 180 tablet 1 08/19/2021 at 0600   naproxen (NAPROSYN) 500 MG tablet Take 1 tablet (500 mg total) by mouth 2 (two) times daily with a meal. 60 tablet 1 08/19/2021 at 0600   rivaroxaban (XARELTO) 20 MG TABS tablet Take 1 tablet (20 mg total) by mouth daily. 90 tablet 1 08/19/2021 at 0600   tiZANidine (ZANAFLEX) 4 MG tablet TAKE 1 TABLET BY MOUTH EVERY 6 HOURS AS NEEDED FOR MUSCLE SPASMS. 120 tablet 1 08/18/2021 at 2000   albuterol (VENTOLIN HFA) 108 (90 Base) MCG/ACT inhaler Inhale 2 puffs into the lungs every 4 (four) hours as needed for wheezing or shortness of breath. 8 g 0 unknown at prn   doxazosin (CARDURA) 2 MG tablet Take by mouth. (  Patient not taking: Reported on 08/19/2021)   Not Taking   Scheduled:   atorvastatin  10 mg Oral Daily   benazepril  20 mg Oral Daily   fluticasone  1 spray Each Nare Daily   furosemide  20 mg Oral Daily   metoprolol tartrate  100 mg Oral BID   potassium chloride  40 mEq Oral Once   Infusions:   sodium chloride 40 mL/hr at 08/20/21 0749   heparin 1,500 Units/hr (08/20/21 0816)   PRN:  Anti-infectives (From admission, onward)    None       Assessment: 70YOM presenting with swelling of the left leg with coloration and redness. Patient has history of DVT with stent placement. Venous ultrasound shows evidence of chronic DVT. O2 sat 100% on RA. On rivaroxaban PTA. Hgb stable at 12.7, Plts WNL. Pharmacy consulted for heparin infusion. Heme and vascular consulted. Will use aPTT monitoring due to false elevation of anti-xa level due to the DOAC. Last dose of rivaroxaban 10/12 0600.   10/12 1909 heparin level > 1.1  10/13 0758  aPTT 53 no heparin level was drawn.    Goal of Therapy:  aPTT 66-102 seconds  Heparin level 0.3-0.7 units/ml once aPTT and heparin level correlate  Monitor platelets by anticoagulation protocol: Yes   Plan:  aPTT is subtherapeutic. Will give 3000 units bolus and increase heparin infusion to 1700 units/hr. Recheck aPTT in 6 hours. CBC and heparin level with AM labs. Switch to heparin monitoring once aPTT and heparin level correlate.   Ronnald Ramp, PharmD 08/20/2021 8:49 AM

## 2021-08-20 NOTE — Progress Notes (Signed)
ANTICOAGULATION CONSULT NOTE   Pharmacy Consult for Heparin Indication: Hx of DVT  No Known Allergies  Patient Measurements: Height: 5\' 10"  (177.8 cm) Weight: 107.7 kg (237 lb 8 oz) IBW/kg (Calculated) : 73 Heparin Dosing Weight: 95.4 kg  Vital Signs: Temp: 97.9 F (36.6 C) (10/13 1541) Temp Source: Oral (10/13 1541) BP: 162/82 (10/13 1541) Pulse Rate: 89 (10/13 1541)  Labs: Recent Labs    08/19/21 1820 08/19/21 1909 08/20/21 0602 08/20/21 0758 08/20/21 1556  HGB 12.7*  --  11.5*  --   --   HCT 38.6*  --  33.9*  --   --   PLT 195  --  173  --   --   APTT  --  46*  --  53* 57*  LABPROT  --  21.2*  --   --   --   INR  --  1.8*  --   --   --   HEPARINUNFRC  --  >1.10*  --   --   --   CREATININE 1.19  --  1.25*  --   --      Estimated Creatinine Clearance: 67.6 mL/min (A) (by C-G formula based on SCr of 1.25 mg/dL (H)).   Medical History: Past Medical History:  Diagnosis Date   Chronic deep vein thrombosis (DVT) (HCC)    Chronic low back pain    Chronic systolic heart failure (HCC)    a. TTE 2011 with EF 40-45% per notes   Diabetes mellitus without complication (HCC)    Erectile dysfunction    HLD (hyperlipidemia)    Hypertension    Hypogonadism in male    Permanent atrial fibrillation (HCC)    a. s/p DCCV 2011; b. redeveloped Afib in 2016; c. CHADS2VASc at least 5 (CHF, HTN, age x 1, DM, vascular disease); d. on Xarelto   Torn meniscus    right knee    Medications:  Medications Prior to Admission  Medication Sig Dispense Refill Last Dose   amLODipine (NORVASC) 5 MG tablet Take 5 mg by mouth daily.   08/19/2021 at 0600   benazepril (LOTENSIN) 20 MG tablet Take 1 tablet (20 mg total) by mouth daily. 30 tablet 2 08/19/2021 at 0600   dapagliflozin propanediol (FARXIGA) 10 MG TABS tablet Take 1 tablet (10 mg total) by mouth daily before breakfast. 30 tablet 6 08/19/2021 at 0600   fluticasone (FLONASE) 50 MCG/ACT nasal spray Place into both nostrils daily as  needed for allergies or rhinitis.   08/19/2021 at 0600   furosemide (LASIX) 20 MG tablet TAKE 1 TABLET BY MOUTH EVERY DAY 90 tablet 3 08/19/2021 at 0600   metFORMIN (GLUCOPHAGE-XR) 500 MG 24 hr tablet Take 2 tablets (1,000 mg total) by mouth 2 (two) times daily as needed. 360 tablet 1 08/19/2021 at 0600   metoprolol tartrate (LOPRESSOR) 100 MG tablet TAKE 1 TABLET BY MOUTH TWICE A DAY 180 tablet 1 08/19/2021 at 0600   naproxen (NAPROSYN) 500 MG tablet Take 1 tablet (500 mg total) by mouth 2 (two) times daily with a meal. 60 tablet 1 08/19/2021 at 0600   rivaroxaban (XARELTO) 20 MG TABS tablet Take 1 tablet (20 mg total) by mouth daily. 90 tablet 1 08/19/2021 at 0600   tiZANidine (ZANAFLEX) 4 MG tablet TAKE 1 TABLET BY MOUTH EVERY 6 HOURS AS NEEDED FOR MUSCLE SPASMS. 120 tablet 1 08/18/2021 at 2000   albuterol (VENTOLIN HFA) 108 (90 Base) MCG/ACT inhaler Inhale 2 puffs into the lungs every 4 (four) hours  as needed for wheezing or shortness of breath. 8 g 0 unknown at prn   doxazosin (CARDURA) 2 MG tablet Take by mouth. (Patient not taking: Reported on 08/19/2021)   Not Taking   Scheduled:   amLODipine  5 mg Oral Daily   atorvastatin  10 mg Oral Daily   benazepril  20 mg Oral Daily   fluticasone  1 spray Each Nare Daily   furosemide  20 mg Oral Daily   metoprolol tartrate  100 mg Oral BID   Infusions:   sodium chloride Stopped (08/20/21 1613)   [START ON 08/21/2021] sodium chloride     cefTRIAXone (ROCEPHIN)  IV 2 g (08/20/21 1613)   heparin 1,700 Units/hr (08/20/21 1421)   PRN:  Anti-infectives (From admission, onward)    Start     Dose/Rate Route Frequency Ordered Stop   08/20/21 1430  cefTRIAXone (ROCEPHIN) 2 g in sodium chloride 0.9 % 100 mL IVPB        2 g 200 mL/hr over 30 Minutes Intravenous Every 24 hours 08/20/21 1354 08/27/21 1429       Assessment: 70YOM presenting with swelling of the left leg with coloration and redness. Patient has history of DVT with stent placement.  Venous ultrasound shows evidence of chronic DVT. O2 sat 100% on RA. On rivaroxaban PTA. Hgb stable at 12.7, Plts WNL. Pharmacy consulted for heparin infusion. Heme and vascular consulted. Will use aPTT monitoring due to false elevation of anti-xa level due to the DOAC. Last dose of rivaroxaban 10/12 0600.   10/12 1909 heparin level > 1.1  10/13 0758 aPTT 53 no heparin level was drawn.  10/13 1556 aPTT 57 no heparin level was drawn   Goal of Therapy:  aPTT 66-102 seconds  Heparin level 0.3-0.7 units/ml once aPTT and heparin level correlate  Monitor platelets by anticoagulation protocol: Yes   Plan:  aPTT is subtherapeutic. Will give 1500 units bolus and increase heparin infusion to 1900 units/hr. Recheck aPTT in 6 hours. CBC and heparin level with AM labs. Switch to heparin monitoring once aPTT and heparin level correlate.   Doloris Hall, PharmD Pharmacy Resident  08/20/2021 4:55 PM

## 2021-08-21 ENCOUNTER — Other Ambulatory Visit (INDEPENDENT_AMBULATORY_CARE_PROVIDER_SITE_OTHER): Payer: Self-pay | Admitting: Vascular Surgery

## 2021-08-21 ENCOUNTER — Encounter
Admission: EM | Disposition: A | Discharge status: Home / Self Care | Payer: Self-pay | Source: Home / Self Care | Attending: Internal Medicine

## 2021-08-21 DIAGNOSIS — Z86718 Personal history of other venous thrombosis and embolism: Secondary | ICD-10-CM

## 2021-08-21 DIAGNOSIS — M7989 Other specified soft tissue disorders: Secondary | ICD-10-CM

## 2021-08-21 DIAGNOSIS — L8990 Pressure ulcer of unspecified site, unspecified stage: Secondary | ICD-10-CM | POA: Insufficient documentation

## 2021-08-21 DIAGNOSIS — I482 Chronic atrial fibrillation, unspecified: Secondary | ICD-10-CM | POA: Diagnosis not present

## 2021-08-21 DIAGNOSIS — R739 Hyperglycemia, unspecified: Secondary | ICD-10-CM

## 2021-08-21 DIAGNOSIS — Z95820 Peripheral vascular angioplasty status with implants and grafts: Secondary | ICD-10-CM

## 2021-08-21 DIAGNOSIS — M79605 Pain in left leg: Secondary | ICD-10-CM

## 2021-08-21 DIAGNOSIS — I824Y2 Acute embolism and thrombosis of unspecified deep veins of left proximal lower extremity: Secondary | ICD-10-CM | POA: Diagnosis not present

## 2021-08-21 DIAGNOSIS — I10 Essential (primary) hypertension: Secondary | ICD-10-CM | POA: Diagnosis not present

## 2021-08-21 DIAGNOSIS — L03116 Cellulitis of left lower limb: Secondary | ICD-10-CM | POA: Diagnosis not present

## 2021-08-21 HISTORY — PX: LOWER EXTREMITY VENOGRAPHY: CATH118253

## 2021-08-21 LAB — ANTITHROMBIN III: AntiThromb III Func: 85 % (ref 75–120)

## 2021-08-21 LAB — CBC
HCT: 35.5 % — ABNORMAL LOW (ref 39.0–52.0)
HCT: 36.9 % — ABNORMAL LOW (ref 39.0–52.0)
Hemoglobin: 11.9 g/dL — ABNORMAL LOW (ref 13.0–17.0)
Hemoglobin: 12.1 g/dL — ABNORMAL LOW (ref 13.0–17.0)
MCH: 29.8 pg (ref 26.0–34.0)
MCH: 29.4 pg (ref 26.0–34.0)
MCHC: 33.5 g/dL (ref 30.0–36.0)
MCHC: 32.8 g/dL (ref 30.0–36.0)
MCV: 89 fL (ref 80.0–100.0)
MCV: 89.8 fL (ref 80.0–100.0)
Platelets: 198 10*3/uL (ref 150–400)
Platelets: 193 10*3/uL (ref 150–400)
RBC: 3.99 MIL/uL — ABNORMAL LOW (ref 4.22–5.81)
RBC: 4.11 MIL/uL — ABNORMAL LOW (ref 4.22–5.81)
RDW: 15 % (ref 11.5–15.5)
RDW: 14.9 % (ref 11.5–15.5)
WBC: 5.6 10*3/uL (ref 4.0–10.5)
WBC: 6 10*3/uL (ref 4.0–10.5)
nRBC: 0 % (ref 0.0–0.2)
nRBC: 0 % (ref 0.0–0.2)

## 2021-08-21 LAB — APTT
aPTT: 67 seconds — ABNORMAL HIGH (ref 24–36)
aPTT: 61 seconds — ABNORMAL HIGH (ref 24–36)

## 2021-08-21 LAB — BASIC METABOLIC PANEL
Anion gap: 7 (ref 5–15)
BUN: 15 mg/dL (ref 8–23)
CO2: 27 mmol/L (ref 22–32)
Calcium: 8.2 mg/dL — ABNORMAL LOW (ref 8.9–10.3)
Chloride: 103 mmol/L (ref 98–111)
Creatinine, Ser: 0.95 mg/dL (ref 0.61–1.24)
GFR, Estimated: >60 mL/min (ref >60)
Glucose, Bld: 157 mg/dL — ABNORMAL HIGH (ref 70–99)
Potassium: 3.4 mmol/L — ABNORMAL LOW (ref 3.5–5.1)
Sodium: 137 mmol/L (ref 135–145)

## 2021-08-21 LAB — GLUCOSE, CAPILLARY
Glucose-Capillary: 156 mg/dL — ABNORMAL HIGH (ref 70–99)
Glucose-Capillary: 145 mg/dL — ABNORMAL HIGH (ref 70–99)

## 2021-08-21 LAB — HEPARIN LEVEL (UNFRACTIONATED): Heparin Unfractionated: >1.1 IU/mL — ABNORMAL HIGH (ref 0.30–0.70)

## 2021-08-21 SURGERY — LOWER EXTREMITY VENOGRAPHY
Anesthesia: Moderate Sedation | Laterality: Left

## 2021-08-21 MED ORDER — ACETAMINOPHEN 325 MG PO TABS
650.0000 mg | ORAL_TABLET | Freq: Four times a day (QID) | ORAL | Status: DC | PRN
  Administered 2021-08-21: 650 mg via ORAL
  Filled 2021-08-21: qty 2

## 2021-08-21 MED ORDER — POTASSIUM CHLORIDE CRYS ER 20 MEQ PO TBCR
40.0000 meq | EXTENDED_RELEASE_TABLET | Freq: Once | ORAL | Status: AC
  Administered 2021-08-21: 40 meq via ORAL
  Filled 2021-08-21: qty 4

## 2021-08-21 MED ORDER — ONDANSETRON HCL 4 MG/2ML IJ SOLN: 4.0000 mg | Freq: Four times a day (QID) | INTRAMUSCULAR | Status: DC | PRN

## 2021-08-21 MED ORDER — FAMOTIDINE 20 MG PO TABS: 40.0000 mg | ORAL_TABLET | Freq: Once | ORAL | Status: DC | PRN

## 2021-08-21 MED ORDER — MIDAZOLAM HCL 2 MG/ML PO SYRP: 8.0000 mg | ORAL_SOLUTION | Freq: Once | ORAL | Status: DC | PRN

## 2021-08-21 MED ORDER — HYDROMORPHONE HCL 1 MG/ML IJ SOLN: 1.0000 mg | Freq: Once | INTRAMUSCULAR | Status: DC | PRN

## 2021-08-21 MED ORDER — IODIXANOL 320 MG/ML IV SOLN
INTRAVENOUS | Status: DC | PRN
  Administered 2021-08-21: 10 mL via INTRAVENOUS

## 2021-08-21 MED ORDER — CEFAZOLIN SODIUM-DEXTROSE 1-4 GM/50ML-% IV SOLN
1.0000 g | Freq: Once | INTRAVENOUS | Status: AC
  Administered 2021-08-21: 1 g via INTRAVENOUS

## 2021-08-21 MED ORDER — METHYLPREDNISOLONE SODIUM SUCC 125 MG IJ SOLR: 125.0000 mg | Freq: Once | INTRAMUSCULAR | Status: DC | PRN

## 2021-08-21 MED ORDER — DIPHENHYDRAMINE HCL 50 MG/ML IJ SOLN: 50.0000 mg | Freq: Once | INTRAMUSCULAR | Status: DC | PRN

## 2021-08-21 MED ORDER — SODIUM CHLORIDE 0.9 % IV SOLN: INTRAVENOUS | Status: DC

## 2021-08-21 MED ORDER — FENTANYL CITRATE (PF) 100 MCG/2ML IJ SOLN
INTRAMUSCULAR | Status: DC | PRN
  Administered 2021-08-21: 50 ug via INTRAVENOUS

## 2021-08-21 MED ORDER — MIDAZOLAM HCL 2 MG/2ML IJ SOLN
INTRAMUSCULAR | Status: DC | PRN
  Administered 2021-08-21: 2 mg via INTRAVENOUS

## 2021-08-21 MED ORDER — HEPARIN SODIUM (PORCINE) 1000 UNIT/ML IJ SOLN
INTRAMUSCULAR | Status: AC
  Filled 2021-08-21: qty 1

## 2021-08-21 MED ORDER — HEPARIN (PORCINE) 25000 UT/250ML-% IV SOLN
2100.0000 [IU]/h | INTRAVENOUS | Status: DC
  Administered 2021-08-21 (×2): 1900 [IU]/h via INTRAVENOUS
  Filled 2021-08-21: qty 250

## 2021-08-21 MED ORDER — MIDAZOLAM HCL 5 MG/5ML IJ SOLN
INTRAMUSCULAR | Status: AC
  Filled 2021-08-21: qty 5

## 2021-08-21 MED ORDER — FENTANYL CITRATE PF 50 MCG/ML IJ SOSY
PREFILLED_SYRINGE | INTRAMUSCULAR | Status: AC
  Filled 2021-08-21: qty 2

## 2021-08-21 SURGICAL SUPPLY — 6 items
COVER PROBE U/S 5X48 (MISCELLANEOUS) ×2 IMPLANT
NEEDLE ENTRY 21GA 7CM ECHOTIP (NEEDLE) ×2 IMPLANT
PACK ANGIOGRAPHY (CUSTOM PROCEDURE TRAY) ×2 IMPLANT
SET INTRO CAPELLA COAXIAL (SET/KITS/TRAYS/PACK) ×2 IMPLANT
SHEATH BRITE TIP 5FRX11 (SHEATH) ×2 IMPLANT
WIRE GUIDERIGHT .035X150 (WIRE) ×2 IMPLANT

## 2021-08-21 NOTE — Care Management Important Message (Signed)
Important Message  Patient Details  Name: Jeff [name]. MRN: [id number] Date of Birth: 01/28/1951   Medicare Important Message Given:  Yes  Patient out of room upon visit.  Medicare IM left in patient's room for reference.     Johnell Comings 08/21/2021, 2:46 PM

## 2021-08-21 NOTE — Progress Notes (Signed)
When initially removing air a little blood put out. Very minimal amount. I reinflated and then deflated 30 minutes later and continued to assess and remove air per policy but carefully

## 2021-08-21 NOTE — Progress Notes (Signed)
ANTICOAGULATION CONSULT NOTE   Pharmacy Consult for Heparin Indication: Hx of DVT  No Known Allergies  Patient Measurements: Height: 5\' 10"  (177.8 cm) Weight: 109.5 kg (241 lb 4.8 oz) IBW/kg (Calculated) : 73 Heparin Dosing Weight: 95.4 kg  Vital Signs: Temp: 97.5 F (36.4 C) (10/14 0428) Temp Source: Oral (10/14 0428) BP: 136/83 (10/14 0428) Pulse Rate: 89 (10/14 0428)  Labs: Recent Labs    08/19/21 1820 08/19/21 1909 08/20/21 0602 08/20/21 0758 08/20/21 1556 08/20/21 2214 08/21/21 0612  HGB 12.7*  --  11.5*  --   --   --  11.9*  HCT 38.6*  --  33.9*  --   --   --  35.5*  PLT 195  --  173  --   --   --  198  APTT  --  46*  --    < > 57* 67* 67*  LABPROT  --  21.2*  --   --   --   --   --   INR  --  1.8*  --   --   --   --   --   HEPARINUNFRC  --  >1.10*  --   --   --   --  >1.10*  CREATININE 1.19  --  1.25*  --   --   --   --    < > = values in this interval not displayed.     Estimated Creatinine Clearance: 68.1 mL/min (A) (by C-G formula based on SCr of 1.25 mg/dL (H)).   Medical History: Past Medical History:  Diagnosis Date   Chronic deep vein thrombosis (DVT) (HCC)    Chronic low back pain    Chronic systolic heart failure (HCC)    a. TTE 2011 with EF 40-45% per notes   Diabetes mellitus without complication (HCC)    Erectile dysfunction    HLD (hyperlipidemia)    Hypertension    Hypogonadism in male    Permanent atrial fibrillation (HCC)    a. s/p DCCV 2011; b. redeveloped Afib in 2016; c. CHADS2VASc at least 5 (CHF, HTN, age x 1, DM, vascular disease); d. on Xarelto   Torn meniscus    right knee    Medications:  Medications Prior to Admission  Medication Sig Dispense Refill Last Dose   amLODipine (NORVASC) 5 MG tablet Take 5 mg by mouth daily.   08/19/2021 at 0600   benazepril (LOTENSIN) 20 MG tablet Take 1 tablet (20 mg total) by mouth daily. 30 tablet 2 08/19/2021 at 0600   dapagliflozin propanediol (FARXIGA) 10 MG TABS tablet Take 1  tablet (10 mg total) by mouth daily before breakfast. 30 tablet 6 08/19/2021 at 0600   fluticasone (FLONASE) 50 MCG/ACT nasal spray Place into both nostrils daily as needed for allergies or rhinitis.   08/19/2021 at 0600   furosemide (LASIX) 20 MG tablet TAKE 1 TABLET BY MOUTH EVERY DAY 90 tablet 3 08/19/2021 at 0600   metFORMIN (GLUCOPHAGE-XR) 500 MG 24 hr tablet Take 2 tablets (1,000 mg total) by mouth 2 (two) times daily as needed. 360 tablet 1 08/19/2021 at 0600   metoprolol tartrate (LOPRESSOR) 100 MG tablet TAKE 1 TABLET BY MOUTH TWICE A DAY 180 tablet 1 08/19/2021 at 0600   naproxen (NAPROSYN) 500 MG tablet Take 1 tablet (500 mg total) by mouth 2 (two) times daily with a meal. 60 tablet 1 08/19/2021 at 0600   rivaroxaban (XARELTO) 20 MG TABS tablet Take 1 tablet (20 mg total) by  mouth daily. 90 tablet 1 08/19/2021 at 0600   tiZANidine (ZANAFLEX) 4 MG tablet TAKE 1 TABLET BY MOUTH EVERY 6 HOURS AS NEEDED FOR MUSCLE SPASMS. 120 tablet 1 08/18/2021 at 2000   albuterol (VENTOLIN HFA) 108 (90 Base) MCG/ACT inhaler Inhale 2 puffs into the lungs every 4 (four) hours as needed for wheezing or shortness of breath. 8 g 0 unknown at prn   doxazosin (CARDURA) 2 MG tablet Take by mouth. (Patient not taking: Reported on 08/19/2021)   Not Taking   Scheduled:   amLODipine  5 mg Oral Daily   atorvastatin  10 mg Oral Daily   benazepril  20 mg Oral Daily   fluticasone  1 spray Each Nare Daily   furosemide  20 mg Oral Daily   metoprolol tartrate  100 mg Oral BID   Infusions:   sodium chloride 75 mL/hr at 08/21/21 0026   cefTRIAXone (ROCEPHIN)  IV 2 g (08/20/21 1613)   heparin 1,900 Units/hr (08/21/21 0315)   PRN:  Anti-infectives (From admission, onward)    Start     Dose/Rate Route Frequency Ordered Stop   08/20/21 1430  cefTRIAXone (ROCEPHIN) 2 g in sodium chloride 0.9 % 100 mL IVPB        2 g 200 mL/hr over 30 Minutes Intravenous Every 24 hours 08/20/21 1354 08/27/21 1429        Assessment: 70YOM presenting with swelling of the left leg with coloration and redness. Patient has history of DVT with stent placement. Venous ultrasound shows evidence of chronic DVT. O2 sat 100% on RA. On rivaroxaban PTA. Hgb stable at 12.7, Plts WNL. Pharmacy consulted for heparin infusion. Heme and vascular consulted. Will use aPTT monitoring due to false elevation of anti-xa level due to the DOAC. Last dose of rivaroxaban 10/12 0600.   10/12 1909 heparin level > 1.1  10/13 0758 aPTT 53 no heparin level was drawn.  10/13 1556 aPTT 57 no heparin level was drawn 10/13 2214 aPTT 67 , therapeutic X 1  10/14 0612 aPTT 67, therapeutic X 2    Goal of Therapy:  aPTT 66-102 seconds  Heparin level 0.3-0.7 units/ml once aPTT and heparin level correlate  Monitor platelets by anticoagulation protocol: Yes   Plan:  10/14 @ 0612:   HL > 1.10,  aPTT = 67 (therapeutic X 2) Will continue pt on current rate and recheck HL and aPTT on 10/15 with AM labs.   Kadia Abaya D, PharmD 08/21/2021 6:58 AM

## 2021-08-21 NOTE — Interval H&P Note (Signed)
History and Physical Interval Note:  08/21/2021 1:46 PM  Jeff [name].  has presented today for surgery, with the diagnosis of DVT.  The various methods of treatment have been discussed with the patient and family. After consideration of risks, benefits and other options for treatment, the patient has consented to  Procedure(s) with comments: LOWER EXTREMITY VENOGRAPHY (Left) - Possible IVC Filter Insertion as a surgical intervention.  The patient's history has been reviewed, patient examined, no change in status, stable for surgery.  I have reviewed the patient's chart and labs.  Questions were answered to the patient's satisfaction.     Levora Dredge

## 2021-08-21 NOTE — Progress Notes (Signed)
ANTICOAGULATION CONSULT NOTE   Pharmacy Consult for Heparin Indication: Hx of DVT  No Known Allergies  Patient Measurements: Height: 5\' 10"  (177.8 cm) Weight: 109.5 kg (241 lb 4.8 oz) IBW/kg (Calculated) : 73 Heparin Dosing Weight: 95.4 kg  Vital Signs: Temp: 98.1 F (36.7 C) (10/14 1053) Temp Source: Oral (10/14 0742) BP: 136/95 (10/14 1445) Pulse Rate: 86 (10/14 1445)  Labs: Recent Labs    08/19/21 1820 08/19/21 1909 08/20/21 0602 08/20/21 0758 08/20/21 1556 08/20/21 2214 08/21/21 0612  HGB 12.7*  --  11.5*  --   --   --  11.9*  HCT 38.6*  --  33.9*  --   --   --  35.5*  PLT 195  --  173  --   --   --  198  APTT  --  46*  --    < > 57* 67* 67*  LABPROT  --  21.2*  --   --   --   --   --   INR  --  1.8*  --   --   --   --   --   HEPARINUNFRC  --  >1.10*  --   --   --   --  >1.10*  CREATININE 1.19  --  1.25*  --   --   --  0.95   < > = values in this interval not displayed.     Estimated Creatinine Clearance: 89.6 mL/min (by C-G formula based on SCr of 0.95 mg/dL).   Medical History: Past Medical History:  Diagnosis Date   Chronic deep vein thrombosis (DVT) (HCC)    Chronic low back pain    Chronic systolic heart failure (HCC)    a. TTE 2011 with EF 40-45% per notes   Diabetes mellitus without complication (HCC)    Erectile dysfunction    HLD (hyperlipidemia)    Hypertension    Hypogonadism in male    Permanent atrial fibrillation (HCC)    a. s/p DCCV 2011; b. redeveloped Afib in 2016; c. CHADS2VASc at least 5 (CHF, HTN, age x 1, DM, vascular disease); d. on Xarelto   Torn meniscus    right knee    Medications:  Medications Prior to Admission  Medication Sig Dispense Refill Last Dose   amLODipine (NORVASC) 5 MG tablet Take 5 mg by mouth daily.   08/19/2021 at 0600   benazepril (LOTENSIN) 20 MG tablet Take 1 tablet (20 mg total) by mouth daily. 30 tablet 2 08/19/2021 at 0600   dapagliflozin propanediol (FARXIGA) 10 MG TABS tablet Take 1 tablet (10 mg  total) by mouth daily before breakfast. 30 tablet 6 08/19/2021 at 0600   fluticasone (FLONASE) 50 MCG/ACT nasal spray Place into both nostrils daily as needed for allergies or rhinitis.   08/19/2021 at 0600   furosemide (LASIX) 20 MG tablet TAKE 1 TABLET BY MOUTH EVERY DAY 90 tablet 3 08/19/2021 at 0600   metFORMIN (GLUCOPHAGE-XR) 500 MG 24 hr tablet Take 2 tablets (1,000 mg total) by mouth 2 (two) times daily as needed. 360 tablet 1 08/19/2021 at 0600   metoprolol tartrate (LOPRESSOR) 100 MG tablet TAKE 1 TABLET BY MOUTH TWICE A DAY 180 tablet 1 08/19/2021 at 0600   naproxen (NAPROSYN) 500 MG tablet Take 1 tablet (500 mg total) by mouth 2 (two) times daily with a meal. 60 tablet 1 08/19/2021 at 0600   rivaroxaban (XARELTO) 20 MG TABS tablet Take 1 tablet (20 mg total) by mouth daily. 90 tablet  1 08/19/2021 at 0600   tiZANidine (ZANAFLEX) 4 MG tablet TAKE 1 TABLET BY MOUTH EVERY 6 HOURS AS NEEDED FOR MUSCLE SPASMS. 120 tablet 1 08/18/2021 at 2000   albuterol (VENTOLIN HFA) 108 (90 Base) MCG/ACT inhaler Inhale 2 puffs into the lungs every 4 (four) hours as needed for wheezing or shortness of breath. 8 g 0 unknown at prn   doxazosin (CARDURA) 2 MG tablet Take by mouth. (Patient not taking: Reported on 08/19/2021)   Not Taking   Scheduled:   amLODipine  5 mg Oral Daily   atorvastatin  10 mg Oral Daily   benazepril  20 mg Oral Daily   fentaNYL       fluticasone  1 spray Each Nare Daily   furosemide  20 mg Oral Daily   heparin sodium (porcine)       metoprolol tartrate  100 mg Oral BID   midazolam       Infusions:   sodium chloride 75 mL/hr at 08/21/21 0026   cefTRIAXone (ROCEPHIN)  IV 2 g (08/21/21 1511)   heparin     PRN:  Anti-infectives (From admission, onward)    Start     Dose/Rate Route Frequency Ordered Stop   08/22/21 0000  ceFAZolin (ANCEF) IVPB 1 g/50 mL premix       Note to Pharmacy: To be given in specials   1 g 100 mL/hr over 30 Minutes Intravenous  Once 08/21/21 1029  08/21/21 1511   08/20/21 1430  cefTRIAXone (ROCEPHIN) 2 g in sodium chloride 0.9 % 100 mL IVPB        2 g 200 mL/hr over 30 Minutes Intravenous Every 24 hours 08/20/21 1354 08/27/21 1429       Assessment: 70YOM presenting with swelling of the left leg with coloration and redness. Patient has history of DVT with stent placement. Venous ultrasound shows evidence of chronic DVT. O2 sat 100% on RA. On rivaroxaban PTA. Hgb stable at 12.7, Plts WNL. Pharmacy consulted for heparin infusion. Heme and vascular consulted. Will use aPTT monitoring due to false elevation of anti-xa level due to the DOAC. Last dose of rivaroxaban 10/12 0600.   10/12 1909 heparin level > 1.1  10/13 0758 aPTT 53 no heparin level was drawn.  10/13 1556 aPTT 57 no heparin level was drawn 10/13 2214 aPTT 67 , therapeutic X 1  10/14 0612 aPTT 67, therapeutic X 2    Goal of Therapy:  aPTT 66-102 seconds  Heparin level 0.3-0.7 units/ml once aPTT and heparin level correlate  Monitor platelets by anticoagulation protocol: Yes   Plan:  Per Dr. Gilda Crease, restart heparin drip at previous rate (1900 units/hr) at 1630 with no bolus but okay to follow nomogram thereafter.  Check aPTT 6 hours after restart Check HL daily with AM labs. Switch to HL when correlating with aPTT.   Raiford Noble, PharmD 08/21/2021 3:24 PM

## 2021-08-21 NOTE — Op Note (Signed)
 [facility]  Percutaneous Study/Intervention Procedural Note   Date of Surgery: 08/21/2021,2:27 PM  Surgeon:Peg Fifer, Latina Craver   Pre-operative Diagnosis: Stenosis of the inferior vena cava; acute on chronic DVT  Post-operative diagnosis: Widely patent inferior vena cava without stenosis no evidence of acute DVT  Procedure(s) Performed:  1.  Ultrasound-guided access to the left common femoral vein  2.  Contrast injection left iliac veins and inferior vena cava    Anesthesia: Conscious sedation was administered by the interventional radiology RN under my direct supervision. IV Versed plus fentanyl were utilized. Continuous ECG, pulse oximetry and blood pressure was monitored throughout the entire procedure. Conscious sedation was administered for a total of 15 minutes.  Sheath: 5 French 11 cm Pinnacle sheath  Contrast: 10 cc   Fluoroscopy Time: 0.1 minutes  Indications: Patient presented with increasing leg pain and swelling.  Radiologic studies were reported as acute on chronic DVT.  Patient has an extensive history of multiple interventions in his inferior vena cava as well as multiple episodes of DVT.  He is therefore undergoing appropriate imaging to determine whether his anticoagulation has failed and whether there is any indication for further intervention within the iliac veins and inferior vena cava.  Procedure:  Jeff [name].is a 70 y.o. male who was identified and appropriate procedural time out was performed.  The patient was then placed supine on the table and prepped and draped in the usual sterile fashion.  Ultrasound was used to evaluate the left common femoral vein.  It was echolucent and pulsatile indicating it is patent .  An ultrasound image was acquired for the permanent record.  A micropuncture needle was used to access the left common femoral under direct ultrasound guidance.  The microwire was then advanced under fluoroscopic guidance  without difficulty followed by the micro-sheath.  A 0.035 J wire was advanced without resistance and a 5Fr sheath was placed.    Hand-injection contrast was then used to image the left iliac veins as well as the inferior vena cava.  After review of the images the sheath was pulled and pressure was held  Findings:   Inferior venacavogram: Previous interventions are present there are 2 stents placed and they are both widely patent.  There is no evidence for stricture or stenosis of the inferior vena cava from the confluence of the iliac veins to the atrium  Left Lower Extremity: The external iliac and common iliac veins on the left are widely patent without evidence of stricture stenosis or acute deep venous thrombosis.    Disposition: Patient was taken to the recovery room in stable condition having tolerated the procedure well.  Jeff [name] 08/21/2021,2:27 PM

## 2021-08-21 NOTE — Progress Notes (Signed)
PROGRESS NOTE    Jeff [name].  MAU:[id number] DOB: [birth year] DOA: 08/19/2021 PCP: Dorcas Carrow, DO    Brief Narrative:  Jeff [name]. is a 70 y.o. male seen in ed with complaints of left leg swelling that started acutely today.  Per patient report patient has a history of blood clots and has been on blood thinner for a long time.  Patient had an IVC filter that was removed in 2019 patient has had multiple angiograms angioplasty and stents to his IVC by vascular surgeon. Patient notes pain in the left leg.   10/13- speaking with pt this am, he reports wearing shorts yesterday while he was at his dermatologist office.  His dermatologist noted his left leg was more swollen than the right and told him to come for evaluation possibly for blood clot.  And he presented to the ER for evaluation.  He reported to me he has chronic left lower extremity swelling but yesterday was more swollen than usual.  He has not been wearing his compression stockings like he usually do on days he wear shorts.  10/14 venogram today.   Consultants:  Vascular surgery, hematology, cards  Procedures:  Venous ultrasound: 1. Stable chronic nonocclusive post thrombotic changes in the popliteal and femoral veins. 2. No evidence of acute superimposed DVT.   Antimicrobials:   Rocephin   Subjective: Lt tigh area swelling some down. Less pain. No sob or cp  Objective: Vitals:   08/21/21 0012 08/21/21 0428 08/21/21 0442 08/21/21 0742  BP: 134/81 136/83  (!) 142/99  Pulse: (!) 52 89  88  Resp: 20 20  17   Temp: 98.4 F (36.9 C) (!) 97.5 F (36.4 C)  97.7 F (36.5 C)  TempSrc: Oral Oral  Oral  SpO2: 95% 95%  97%  Weight:   109.5 kg   Height:        Intake/Output Summary (Last 24 hours) at 08/21/2021 0859 Last data filed at 08/21/2021 0600 Gross per 24 hour  Intake 2146.61 ml  Output 1250 ml  Net 896.61 ml   Filed Weights   08/19/21 1659 08/19/21 2255 08/21/21 0442  Weight: 105.2 kg  107.7 kg 109.5 kg    Examination:  Nad, calm Cta no w/r/ Rrr s1/s2 no gallop Soft benign +bs Lt tigh swelling decrease some, redness mildly improved, warm to touch and mildly tender still [id number]    Data Reviewed: I have personally reviewed following labs and imaging studies  CBC: Recent Labs  Lab 08/19/21 1820 08/20/21 0602 08/21/21 0612  WBC 9.0 7.0 5.6  NEUTROABS 7.3  --   --   HGB 12.7* 11.5* 11.9*  HCT 38.6* 33.9* 35.5*  MCV 89.8 91.1 89.0  PLT 195 173 198   Basic Metabolic Panel: Recent Labs  Lab 08/19/21 1820 08/20/21 0602 08/21/21 0612  NA 132* 136 137  K 3.3* 3.2* 3.4*  CL 92* 99 103  CO2 25 28 27   GLUCOSE 129* 126* 157*  BUN 23 19 15   CREATININE 1.19 1.25* 0.95  CALCIUM 8.8* 8.3* 8.2*   GFR: Estimated Creatinine Clearance: 89.6 mL/min (by C-G formula based on SCr of 0.95 mg/dL). Liver Function Tests: Recent Labs  Lab 08/20/21 0602  AST 12*  ALT 14  ALKPHOS 52  BILITOT 1.3*  PROT 6.0*  ALBUMIN 3.1*   No results for input(s): LIPASE, AMYLASE in the last 168 hours. No results for input(s): AMMONIA in the last 168 hours. Coagulation Profile: Recent Labs  Lab 08/19/21 1909  INR 1.8*   Cardiac Enzymes: No results for input(s): CKTOTAL, CKMB, CKMBINDEX, TROPONINI in the last 168 hours. BNP (last 3 results) No results for input(s): PROBNP in the last 8760 hours. HbA1C: No results for input(s): HGBA1C in the last 72 hours. CBG: Recent Labs  Lab 08/21/21 0745  GLUCAP 156*   Lipid Profile: No results for input(s): CHOL, HDL, LDLCALC, TRIG, CHOLHDL, LDLDIRECT in the last 72 hours. Thyroid Function Tests: No results for input(s): TSH, T4TOTAL, FREET4, T3FREE, THYROIDAB in the last 72 hours. Anemia Panel: No results for input(s): VITAMINB12, FOLATE, FERRITIN, TIBC, IRON, RETICCTPCT in the last 72 hours. Sepsis Labs: Recent Labs  Lab 08/19/21 1821 08/19/21 2038  LATICACIDVEN 1.2 1.3    Recent Results (from the past 240 hour(s))   Resp Panel by RT-PCR (Flu A&B, Covid) Nasopharyngeal Swab     Status: None   Collection Time: 08/19/21  7:09 PM   Specimen: Nasopharyngeal Swab; Nasopharyngeal(NP) swabs in vial transport medium  Result Value Ref Range Status   SARS Coronavirus 2 by RT PCR NEGATIVE NEGATIVE Final    Comment: (NOTE) SARS-CoV-2 target nucleic acids are NOT DETECTED.  The SARS-CoV-2 RNA is generally detectable in upper respiratory specimens during the acute phase of infection. The lowest concentration of SARS-CoV-2 viral copies this assay can detect is 138 copies/mL. A negative result does not preclude SARS-Cov-2 infection and should not be used as the sole basis for treatment or other patient management decisions. A negative result may occur with  improper specimen collection/handling, submission of specimen other than nasopharyngeal swab, presence of viral mutation(s) within the areas targeted by this assay, and inadequate number of viral copies(<138 copies/mL). A negative result must be combined with clinical observations, patient history, and epidemiological information. The expected result is Negative.  Fact Sheet for Patients:  BloggerCourse.com  Fact Sheet for Healthcare Providers:  SeriousBroker.it  This test is no t yet approved or cleared by the [place] FDA and  has been authorized for detection and/or diagnosis of SARS-CoV-2 by FDA under an Emergency Use Authorization (EUA). This EUA will remain  in effect (meaning this test can be used) for the duration of the COVID-19 declaration under Section 564(b)(1) of the Act, 21 U.S.C.section 360bbb-3(b)(1), unless the authorization is terminated  or revoked sooner.       Influenza A by PCR NEGATIVE NEGATIVE Final   Influenza B by PCR NEGATIVE NEGATIVE Final    Comment: (NOTE) The Xpert Xpress SARS-CoV-2/FLU/RSV plus assay is intended as an aid in the diagnosis of influenza from  Nasopharyngeal swab specimens and should not be used as a sole basis for treatment. Nasal washings and aspirates are unacceptable for Xpert Xpress SARS-CoV-2/FLU/RSV testing.  Fact Sheet for Patients: BloggerCourse.com  Fact Sheet for Healthcare Providers: SeriousBroker.it  This test is not yet approved or cleared by the [place] FDA and has been authorized for detection and/or diagnosis of SARS-CoV-2 by FDA under an Emergency Use Authorization (EUA). This EUA will remain in effect (meaning this test can be used) for the duration of the COVID-19 declaration under Section 564(b)(1) of the Act, 21 U.S.C. section 360bbb-3(b)(1), unless the authorization is terminated or revoked.  Performed at [facility], [address] [place], [place] [address]          Radiology Studies: US Venous Img Lower Unilateral Left (DVT)  Result Date: 08/19/2021 CLINICAL DATA:  Edema, redness, history of chronic DVT EXAM: LEFT LOWER EXTREMITY VENOUS DOPPLER ULTRASOUND TECHNIQUE: Gray-scale sonography with compression, as  well as color and duplex ultrasound, were performed to evaluate the deep venous system(s) from the level of the common femoral vein through the popliteal and proximal calf veins. COMPARISON:  08/17/2018 FINDINGS: VENOUS Stable hypoechoic wall thickening in the popliteal vein and midportion of the femoral vein with incomplete compressibility but continued antegrade flow signal on color Doppler. Normal compressibility of the common femoral, as well as the visualized calf veins. Visualized portions of profunda femoral vein and great saphenous vein unremarkable. No filling defects to suggest acute DVT on grayscale or color Doppler imaging. Doppler waveforms show normal direction of venous flow, normal respiratory phasicity and response to augmentation. Limited views of the contralateral common femoral vein are unremarkable. OTHER  Subcutaneous edema in the calf. Limitations: none IMPRESSION: 1. Stable chronic nonocclusive post thrombotic changes in the popliteal and femoral veins. 2. No evidence of acute superimposed DVT. Electronically Signed   By: Corlis Leak M.D.   On: 08/19/2021 17:52        Scheduled Meds:  amLODipine  5 mg Oral Daily   atorvastatin  10 mg Oral Daily   benazepril  20 mg Oral Daily   fluticasone  1 spray Each Nare Daily   furosemide  20 mg Oral Daily   metoprolol tartrate  100 mg Oral BID   Continuous Infusions:  sodium chloride 75 mL/hr at 08/21/21 0026   cefTRIAXone (ROCEPHIN)  IV 2 g (08/20/21 1613)   heparin 1,900 Units/hr (08/21/21 0315)    Assessment & Plan:   Principal Problem:   Left leg DVT (HCC) Active Problems:   HYPERTENSION, BENIGN   Atrial fibrillation, chronic (HCC)   Diabetes mellitus (HCC)   BPH (benign prostatic hyperplasia)   Congestive heart failure (HCC)   Redness and swelling of lower leg    History of DVT Lt thigh cellulitis Start rocephin for cellulitis Continue heparin gtt Vascular to see pt for further evaluation. Pt told me suppose to have CT done by his doctor at [facility] in 2 weeks...will discuss with vascular to see if should be obtained here for further diagnostic evaluation Patient does have evidence of post thrombotic syndrome.  Will need to wear his compression stockings as outpatient Xarelto on hold for possible vascular work-up i.e. venous 10/14 venogram today, keep iv heparin one more day per vascular surgery post venogram via chat and transition to po a/c Cellulitis mildly improved, will continue with iv rocephin F/u with hematology as outpatient Dr. Cathie Hoops for hypercoagulable work-up    Hypertension- Improving Continue with amlodipine   DM type II BG stable Continue with RISS  Chronic afib  Continue beta blk  Continue with heparin gtt....until venogram results   Hypokalemia K 3.4  Will replace   DVT prophylaxis: Heparin  drip Code Status: Full Family Communication: none at bedside Disposition Plan:  Status is: Inpatient  Remains inpatient appropriate because: due to severity of illness and the need for IV medication treatment.  Dispo: The patient is from: Home              Anticipated d/c is to: Home              Patient currently is not medically stable to d/c.              Difficult to place patient No          LOS: 2 days   Time spent: 45 minutes with more than 50% on COC    Lynn Ito, MD [facility] Pager [phone]  [phone]  If 7PM-7AM, please contact night-coverage 08/21/2021, 8:59 AM

## 2021-08-22 DIAGNOSIS — E1159 Type 2 diabetes mellitus with other circulatory complications: Secondary | ICD-10-CM

## 2021-08-22 DIAGNOSIS — I482 Chronic atrial fibrillation, unspecified: Secondary | ICD-10-CM | POA: Diagnosis not present

## 2021-08-22 DIAGNOSIS — I509 Heart failure, unspecified: Secondary | ICD-10-CM | POA: Diagnosis not present

## 2021-08-22 DIAGNOSIS — I824Y2 Acute embolism and thrombosis of unspecified deep veins of left proximal lower extremity: Secondary | ICD-10-CM | POA: Diagnosis not present

## 2021-08-22 LAB — GLUCOSE, CAPILLARY
Glucose-Capillary: 149 mg/dL — ABNORMAL HIGH (ref 70–99)
Glucose-Capillary: 168 mg/dL — ABNORMAL HIGH (ref 70–99)

## 2021-08-22 LAB — CBC
HCT: 35.7 % — ABNORMAL LOW (ref 39.0–52.0)
Hemoglobin: 12.2 g/dL — ABNORMAL LOW (ref 13.0–17.0)
MCH: 30.5 pg (ref 26.0–34.0)
MCHC: 34.2 g/dL (ref 30.0–36.0)
MCV: 89.3 fL (ref 80.0–100.0)
Platelets: 194 10*3/uL (ref 150–400)
RBC: 4 MIL/uL — ABNORMAL LOW (ref 4.22–5.81)
RDW: 14.6 % (ref 11.5–15.5)
WBC: 7.3 10*3/uL (ref 4.0–10.5)
nRBC: 0 % (ref 0.0–0.2)

## 2021-08-22 LAB — HEPARIN LEVEL (UNFRACTIONATED): Heparin Unfractionated: 0.71 IU/mL — ABNORMAL HIGH (ref 0.30–0.70)

## 2021-08-22 LAB — APTT: aPTT: 88 seconds — ABNORMAL HIGH (ref 24–36)

## 2021-08-22 MED ORDER — CEPHALEXIN 500 MG PO CAPS: 500.0000 mg | ORAL_CAPSULE | Freq: Three times a day (TID) | ORAL | 0 refills | Status: AC

## 2021-08-22 MED ORDER — HEPARIN BOLUS VIA INFUSION
1400.0000 [IU] | Freq: Once | INTRAVENOUS | Status: AC
  Administered 2021-08-22: 1400 [IU] via INTRAVENOUS
  Filled 2021-08-22: qty 1400

## 2021-08-22 MED ORDER — RIVAROXABAN 20 MG PO TABS: 20.0000 mg | ORAL_TABLET | Freq: Every day | ORAL | Status: DC

## 2021-08-22 NOTE — Progress Notes (Signed)
ANTICOAGULATION CONSULT NOTE   Pharmacy Consult for Heparin Indication: Hx of DVT  No Known Allergies  Patient Measurements: Height: 5\' 10"  (177.8 cm) Weight: 109.5 kg (241 lb 4.8 oz) IBW/kg (Calculated) : 73 Heparin Dosing Weight: 95.4 kg  Vital Signs: Temp: 98.1 F (36.7 C) (10/15 0801) Temp Source: Oral (10/15 0801) BP: 167/96 (10/15 0801) Pulse Rate: 93 (10/15 0801)  Labs: Recent Labs    08/19/21 1820 08/19/21 1909 08/20/21 0602 08/20/21 0758 08/21/21 0612 08/21/21 1515 08/21/21 2255 08/22/21 0648  HGB 12.7*  --  11.5*  --  11.9* 12.1*  --  12.2*  HCT 38.6*  --  33.9*  --  35.5* 36.9*  --  35.7*  PLT 195  --  173  --  198 193  --  194  APTT  --  46*  --    < > 67*  --  61* 88*  LABPROT  --  21.2*  --   --   --   --   --   --   INR  --  1.8*  --   --   --   --   --   --   HEPARINUNFRC  --  >1.10*  --   --  >1.10*  --   --  0.71*  CREATININE 1.19  --  1.25*  --  0.95  --   --   --    < > = values in this interval not displayed.     Estimated Creatinine Clearance: 89.6 mL/min (by C-G formula based on SCr of 0.95 mg/dL).   Medical History: Past Medical History:  Diagnosis Date   Chronic deep vein thrombosis (DVT) (HCC)    Chronic low back pain    Chronic systolic heart failure (HCC)    a. TTE 2011 with EF 40-45% per notes   Diabetes mellitus without complication (HCC)    Erectile dysfunction    HLD (hyperlipidemia)    Hypertension    Hypogonadism in male    Permanent atrial fibrillation (HCC)    a. s/p DCCV 2011; b. redeveloped Afib in 2016; c. CHADS2VASc at least 5 (CHF, HTN, age x 1, DM, vascular disease); d. on Xarelto   Torn meniscus    right knee    Medications:  Medications Prior to Admission  Medication Sig Dispense Refill Last Dose   amLODipine (NORVASC) 5 MG tablet Take 5 mg by mouth daily.   08/19/2021 at 0600   benazepril (LOTENSIN) 20 MG tablet Take 1 tablet (20 mg total) by mouth daily. 30 tablet 2 08/19/2021 at 0600   dapagliflozin  propanediol (FARXIGA) 10 MG TABS tablet Take 1 tablet (10 mg total) by mouth daily before breakfast. 30 tablet 6 08/19/2021 at 0600   fluticasone (FLONASE) 50 MCG/ACT nasal spray Place into both nostrils daily as needed for allergies or rhinitis.   08/19/2021 at 0600   furosemide (LASIX) 20 MG tablet TAKE 1 TABLET BY MOUTH EVERY DAY 90 tablet 3 08/19/2021 at 0600   metFORMIN (GLUCOPHAGE-XR) 500 MG 24 hr tablet Take 2 tablets (1,000 mg total) by mouth 2 (two) times daily as needed. 360 tablet 1 08/19/2021 at 0600   metoprolol tartrate (LOPRESSOR) 100 MG tablet TAKE 1 TABLET BY MOUTH TWICE A DAY 180 tablet 1 08/19/2021 at 0600   naproxen (NAPROSYN) 500 MG tablet Take 1 tablet (500 mg total) by mouth 2 (two) times daily with a meal. 60 tablet 1 08/19/2021 at 0600   rivaroxaban (XARELTO) 20 MG TABS  tablet Take 1 tablet (20 mg total) by mouth daily. 90 tablet 1 08/19/2021 at 0600   tiZANidine (ZANAFLEX) 4 MG tablet TAKE 1 TABLET BY MOUTH EVERY 6 HOURS AS NEEDED FOR MUSCLE SPASMS. 120 tablet 1 08/18/2021 at 2000   albuterol (VENTOLIN HFA) 108 (90 Base) MCG/ACT inhaler Inhale 2 puffs into the lungs every 4 (four) hours as needed for wheezing or shortness of breath. 8 g 0 unknown at prn   doxazosin (CARDURA) 2 MG tablet Take by mouth. (Patient not taking: Reported on 08/19/2021)   Not Taking   Scheduled:   amLODipine  5 mg Oral Daily   atorvastatin  10 mg Oral Daily   benazepril  20 mg Oral Daily   fluticasone  1 spray Each Nare Daily   furosemide  20 mg Oral Daily   metoprolol tartrate  100 mg Oral BID   Infusions:   sodium chloride 75 mL/hr at 08/22/21 0102   cefTRIAXone (ROCEPHIN)  IV 2 g (08/22/21 0020)   heparin 2,100 Units/hr (08/22/21 0051)   PRN:  Anti-infectives (From admission, onward)    Start     Dose/Rate Route Frequency Ordered Stop   08/22/21 0000  ceFAZolin (ANCEF) IVPB 1 g/50 mL premix       Note to Pharmacy: To be given in specials   1 g 100 mL/hr over 30 Minutes Intravenous   Once 08/21/21 1029 08/21/21 1511   08/20/21 1430  cefTRIAXone (ROCEPHIN) 2 g in sodium chloride 0.9 % 100 mL IVPB        2 g 200 mL/hr over 30 Minutes Intravenous Every 24 hours 08/20/21 1354 08/27/21 1429       Assessment: 70YOM presenting with swelling of the left leg with coloration and redness. Patient has history of DVT with stent placement. Venous ultrasound shows evidence of chronic DVT. O2 sat 100% on RA. On rivaroxaban PTA. Hgb stable at 12.7, Plts WNL. Pharmacy consulted for heparin infusion. Heme and vascular consulted. Will use aPTT monitoring due to false elevation of anti-xa level due to the DOAC. Last dose of rivaroxaban 10/12 0600.   10/12 1909 heparin level > 1.1  10/13 0758 aPTT 53 no heparin level was drawn.  10/13 1556 aPTT 57 no heparin level was drawn 10/13 2214 aPTT 67 , therapeutic X 1  10/14 0612 aPTT 67, therapeutic X 2  10/14 2255 aPTT 61, subtherapeutic  1400 u bolus/inc rate to 2100 units/hr 10/15 0648 aPTT 88, thera   cont rate   Goal of Therapy:  aPTT 66-102 seconds  Heparin level 0.3-0.7 units/ml once aPTT and heparin level correlate  Monitor platelets by anticoagulation protocol: Yes   Plan:  10/15:  aPTT @ 0648= 88, therapeutic  HL=0.71 Will continue drip rate at 2100 units/hr. Will recheck confirmatory aPTT in 6 hrs. HL almost correlating. F/u HL daily and plan to switch to HL monitoring when correlates with aPTT   Kincade Granberg A, PharmD 08/22/2021 8:02 AM

## 2021-08-22 NOTE — Progress Notes (Signed)
ANTICOAGULATION CONSULT NOTE   Pharmacy Consult for Heparin Indication: Hx of DVT  No Known Allergies  Patient Measurements: Height: 5\' 10"  (177.8 cm) Weight: 109.5 kg (241 lb 4.8 oz) IBW/kg (Calculated) : 73 Heparin Dosing Weight: 95.4 kg  Vital Signs: Temp: 98.3 F (36.8 C) (10/15 0003) Temp Source: Oral (10/15 0003) BP: 175/93 (10/15 0003) Pulse Rate: 95 (10/15 0003)  Labs: Recent Labs    08/19/21 1820 08/19/21 1909 08/20/21 0602 08/20/21 0758 08/20/21 2214 08/21/21 0612 08/21/21 1515 08/21/21 2255  HGB 12.7*  --  11.5*  --   --  11.9* 12.1*  --   HCT 38.6*  --  33.9*  --   --  35.5* 36.9*  --   PLT 195  --  173  --   --  198 193  --   APTT  --  46*  --    < > 67* 67*  --  61*  LABPROT  --  21.2*  --   --   --   --   --   --   INR  --  1.8*  --   --   --   --   --   --   HEPARINUNFRC  --  >1.10*  --   --   --  >1.10*  --   --   CREATININE 1.19  --  1.25*  --   --  0.95  --   --    < > = values in this interval not displayed.     Estimated Creatinine Clearance: 89.6 mL/min (by C-G formula based on SCr of 0.95 mg/dL).   Medical History: Past Medical History:  Diagnosis Date   Chronic deep vein thrombosis (DVT) (HCC)    Chronic low back pain    Chronic systolic heart failure (HCC)    a. TTE 2011 with EF 40-45% per notes   Diabetes mellitus without complication (HCC)    Erectile dysfunction    HLD (hyperlipidemia)    Hypertension    Hypogonadism in male    Permanent atrial fibrillation (HCC)    a. s/p DCCV 2011; b. redeveloped Afib in 2016; c. CHADS2VASc at least 5 (CHF, HTN, age x 1, DM, vascular disease); d. on Xarelto   Torn meniscus    right knee    Medications:  Medications Prior to Admission  Medication Sig Dispense Refill Last Dose   amLODipine (NORVASC) 5 MG tablet Take 5 mg by mouth daily.   08/19/2021 at 0600   benazepril (LOTENSIN) 20 MG tablet Take 1 tablet (20 mg total) by mouth daily. 30 tablet 2 08/19/2021 at 0600   dapagliflozin  propanediol (FARXIGA) 10 MG TABS tablet Take 1 tablet (10 mg total) by mouth daily before breakfast. 30 tablet 6 08/19/2021 at 0600   fluticasone (FLONASE) 50 MCG/ACT nasal spray Place into both nostrils daily as needed for allergies or rhinitis.   08/19/2021 at 0600   furosemide (LASIX) 20 MG tablet TAKE 1 TABLET BY MOUTH EVERY DAY 90 tablet 3 08/19/2021 at 0600   metFORMIN (GLUCOPHAGE-XR) 500 MG 24 hr tablet Take 2 tablets (1,000 mg total) by mouth 2 (two) times daily as needed. 360 tablet 1 08/19/2021 at 0600   metoprolol tartrate (LOPRESSOR) 100 MG tablet TAKE 1 TABLET BY MOUTH TWICE A DAY 180 tablet 1 08/19/2021 at 0600   naproxen (NAPROSYN) 500 MG tablet Take 1 tablet (500 mg total) by mouth 2 (two) times daily with a meal. 60 tablet 1 08/19/2021 at  0600   rivaroxaban (XARELTO) 20 MG TABS tablet Take 1 tablet (20 mg total) by mouth daily. 90 tablet 1 08/19/2021 at 0600   tiZANidine (ZANAFLEX) 4 MG tablet TAKE 1 TABLET BY MOUTH EVERY 6 HOURS AS NEEDED FOR MUSCLE SPASMS. 120 tablet 1 08/18/2021 at 2000   albuterol (VENTOLIN HFA) 108 (90 Base) MCG/ACT inhaler Inhale 2 puffs into the lungs every 4 (four) hours as needed for wheezing or shortness of breath. 8 g 0 unknown at prn   doxazosin (CARDURA) 2 MG tablet Take by mouth. (Patient not taking: Reported on 08/19/2021)   Not Taking   Scheduled:   amLODipine  5 mg Oral Daily   atorvastatin  10 mg Oral Daily   benazepril  20 mg Oral Daily   fentaNYL       fluticasone  1 spray Each Nare Daily   furosemide  20 mg Oral Daily   heparin  1,400 Units Intravenous Once   heparin sodium (porcine)       metoprolol tartrate  100 mg Oral BID   midazolam       Infusions:   sodium chloride 75 mL/hr at 08/21/21 0026   cefTRIAXone (ROCEPHIN)  IV 2 g (08/21/21 1511)   heparin 1,900 Units/hr (08/21/21 2303)   PRN:  Anti-infectives (From admission, onward)    Start     Dose/Rate Route Frequency Ordered Stop   08/22/21 0000  ceFAZolin (ANCEF) IVPB 1 g/50  mL premix       Note to Pharmacy: To be given in specials   1 g 100 mL/hr over 30 Minutes Intravenous  Once 08/21/21 1029 08/21/21 1511   08/20/21 1430  cefTRIAXone (ROCEPHIN) 2 g in sodium chloride 0.9 % 100 mL IVPB        2 g 200 mL/hr over 30 Minutes Intravenous Every 24 hours 08/20/21 1354 08/27/21 1429       Assessment: 70YOM presenting with swelling of the left leg with coloration and redness. Patient has history of DVT with stent placement. Venous ultrasound shows evidence of chronic DVT. O2 sat 100% on RA. On rivaroxaban PTA. Hgb stable at 12.7, Plts WNL. Pharmacy consulted for heparin infusion. Heme and vascular consulted. Will use aPTT monitoring due to false elevation of anti-xa level due to the DOAC. Last dose of rivaroxaban 10/12 0600.   10/12 1909 heparin level > 1.1  10/13 0758 aPTT 53 no heparin level was drawn.  10/13 1556 aPTT 57 no heparin level was drawn 10/13 2214 aPTT 67 , therapeutic X 1  10/14 0612 aPTT 67, therapeutic X 2  10/14 2255 aPTT 61, subtherapeutic    Goal of Therapy:  aPTT 66-102 seconds  Heparin level 0.3-0.7 units/ml once aPTT and heparin level correlate  Monitor platelets by anticoagulation protocol: Yes   Plan:  10/14:  aPTT @ 2255 = 61, subtherapeutic Will order heparin 1400 units IV x 1 bolus and increase drip rate to 2100 units/hr. Will recheck HL and aPTT 6 hrs after rate change.   Jeff [name] D, PharmD 08/22/2021 12:10 AM

## 2021-08-22 NOTE — Discharge Summary (Signed)
Jeff [name]. VHQ:[id number] DOB: [birth year] DOA: 08/19/2021  PCP: Dorcas Carrow, DO  Admit date: 08/19/2021 Discharge date: 08/22/2021  Admitted From: home Disposition:  home  Recommendations for Outpatient Follow-up:  Follow up with PCP in 1 week Please obtain BMP/CBC in one week Please follow up vascular surgery as scheduled Follow-up with hematology in 1 week     Discharge Condition:Stable CODE STATUS: Full Diet recommendation: Heart Healthy   Brief/Interim Summary: Per HPI:.Jeff [name]. is a 70 y.o. male seen in ed with complaints of left leg swelling that started acutely . Per patient report patient has a history of blood clots and has been on blood thinner for a long time.  Patient had an IVC filter that was removed in 2019 patient has had multiple angiograms angioplasty and stents to his IVC by vascular surgeon. He went to his dermatologist office today and they felt his left leg was much more swollen than the right and told him to come to the ER for evaluation.  There was concern for blood clot.  Vascular surgery was consulted.  Hematology was also consulted.  Venous ultrasound was obtained revealing 1. Stable chronic nonocclusive post thrombotic changes in the popliteal and femoral veins. 2. No evidence of acute superimposed DVT.   Patient eventually had status post venogram on 10/14 and no new clots were found.  His Xarelto was resumed today.  He was treated for cellulitis of his left thigh with IV antibiotics.  History of DVT Lt thigh cellulitis Was treated with IV Rocephin for cellulitis transition to p.o. on discharge Status post venogram no acute findings , on 10/14 by Dr. Gilda [name] F/u vascular surgery as outpatient Continue ECS Hematology was consulted and needs to follow-up in 1 week for Vascular to see pt for further evaluation. Pt told me suppose to have CT done by his doctor at [facility] in 2 weeks...will discuss with vascular to see if should be  obtained here for further diagnostic evaluation Patient does have evidence of post thrombotic syndrome.  Will need to wear his compression stockings as outpatient Xarelto on hold for possible vascular work-up i.e. venous 10/14 venogram today, keep iv heparin one more day per vascular surgery post venogram via chat and transition to po a/c Cellulitis mildly improved, will continue with iv rocephin F/u with hematology as outpatient Dr. Cathie Hoops for hypercoagulable work-up as outpatient Xarelto was resumed since it was not failure       Hypertension- Continue home meds   DM type II Continue home meds   Chronic afib  Continue Xarelto   Hypokalemia Replaced  Discharge Diagnoses:  Principal Problem:   Left leg DVT (HCC) Active Problems:   HYPERTENSION, BENIGN   Atrial fibrillation, chronic (HCC)   Diabetes mellitus (HCC)   BPH (benign prostatic hyperplasia)   Congestive heart failure (HCC)   Redness and swelling of lower leg   Pressure injury of skin    Discharge Instructions  Discharge Instructions     Call MD for:  redness, tenderness, or signs of infection (pain, swelling, redness, odor or green/yellow discharge around incision site)   Complete by: As directed    Call MD for:  temperature >100.4   Complete by: As directed    Diet - low sodium heart healthy   Complete by: As directed    Discharge instructions   Complete by: As directed    Resume xarelto today when you get home Hold lasix today and resume as prescribed starting tomorrow. Start  antibiotics in a.m.   Increase activity slowly   Complete by: As directed       Allergies as of 08/22/2021   No Known Allergies      Medication List     STOP taking these medications    doxazosin 2 MG tablet Commonly known as: CARDURA   naproxen 500 MG tablet Commonly known as: Naprosyn       TAKE these medications    albuterol 108 (90 Base) MCG/ACT inhaler Commonly known as: VENTOLIN HFA Inhale 2 puffs into  the lungs every 4 (four) hours as needed for wheezing or shortness of breath.   amLODipine 5 MG tablet Commonly known as: NORVASC Take 5 mg by mouth daily.   atorvastatin 20 MG tablet Commonly known as: LIPITOR TAKE 1/2 TABLET BY MOUTH DAILY   benazepril 20 MG tablet Commonly known as: LOTENSIN Take 1 tablet (20 mg total) by mouth daily.   cephALEXin 500 MG capsule Commonly known as: KEFLEX Take 1 capsule (500 mg total) by mouth 3 (three) times daily for 5 days.   dapagliflozin propanediol 10 MG Tabs tablet Commonly known as: Farxiga Take 1 tablet (10 mg total) by mouth daily before breakfast.   fluticasone 50 MCG/ACT nasal spray Commonly known as: FLONASE Place into both nostrils daily as needed for allergies or rhinitis.   furosemide 20 MG tablet Commonly known as: LASIX TAKE 1 TABLET BY MOUTH EVERY DAY   metFORMIN 500 MG 24 hr tablet Commonly known as: GLUCOPHAGE-XR Take 2 tablets (1,000 mg total) by mouth 2 (two) times daily as needed.   metoprolol tartrate 100 MG tablet Commonly known as: LOPRESSOR TAKE 1 TABLET BY MOUTH TWICE A DAY   rivaroxaban 20 MG Tabs tablet Commonly known as: Xarelto Take 1 tablet (20 mg total) by mouth daily.   tiZANidine 4 MG tablet Commonly known as: ZANAFLEX TAKE 1 TABLET BY MOUTH EVERY 6 HOURS AS NEEDED FOR MUSCLE SPASMS.        Follow-up Information     Schnier, Latina Craver, MD Follow up in 2 week(s).   Specialties: Vascular Surgery, Cardiology, Radiology, Vascular Surgery Why: Former patient. Seen as consult. F/U cellulitis. Contact information: [address] [place] [address] [phone]         Rickard Patience, MD Follow up in 1 week(s).   Specialty: Oncology Contact information: [address] [place] [address] [phone]         Olevia Perches P, DO Follow up in 1 week(s).   Specialty: Family Medicine Contact information: [address] [place] [address] [phone]                 No Known Allergies  Consultations: Vascular, hematology   Procedures/Studies: PERIPHERAL VASCULAR CATHETERIZATION  Result Date: 08/21/2021 See surgical note for result.  US Venous Img Lower Unilateral Left (DVT)  Result Date: 08/19/2021 CLINICAL DATA:  Edema, redness, history of chronic DVT EXAM: LEFT LOWER EXTREMITY VENOUS DOPPLER ULTRASOUND TECHNIQUE: Gray-scale sonography with compression, as well as color and duplex ultrasound, were performed to evaluate the deep venous system(s) from the level of the common femoral vein through the popliteal and proximal calf veins. COMPARISON:  08/17/2018 FINDINGS: VENOUS Stable hypoechoic wall thickening in the popliteal vein and midportion of the femoral vein with incomplete compressibility but continued antegrade flow signal on color Doppler. Normal compressibility of the common femoral, as well as the visualized calf veins. Visualized portions of profunda femoral vein and great saphenous vein unremarkable. No  filling defects to suggest acute DVT on grayscale or color Doppler imaging. Doppler waveforms show normal direction of venous flow, normal respiratory phasicity and response to augmentation. Limited views of the contralateral common femoral vein are unremarkable. OTHER Subcutaneous edema in the calf. Limitations: none IMPRESSION: 1. Stable chronic nonocclusive post thrombotic changes in the popliteal and femoral veins. 2. No evidence of acute superimposed DVT. Electronically Signed   By: Corlis Leak M.D.   On: 08/19/2021 17:52      Subjective:   Discharge Exam: Vitals:   08/22/21 0445 08/22/21 0801  BP: (!) 162/93 (!) 167/96  Pulse: (!) 57 93  Resp: 14 18  Temp: 98 F (36.7 C) 98.1 F (36.7 C)  SpO2: 96% 98%   Vitals:   08/22/21 0003 08/22/21 0003 08/22/21 0445 08/22/21 0801  BP: (!) 175/93 (!) 175/93 (!) 162/93 (!) 167/96  Pulse: 95 96 (!) 57 93  Resp: 16 16 14 18   Temp: 98.3 F (36.8 C) 98.3 F (36.8 C) 98 F (36.7  C) 98.1 F (36.7 C)  TempSrc: Oral Oral Oral Oral  SpO2: 95% 96% 96% 98%  Weight:      Height:        General: Pt is alert, awake, not in acute distress Cardiovascular: RRR, S1/S2 +, no rubs, no gallops Respiratory: CTA bilaterally, no wheezing, no rhonchi Abdominal: Soft, NT, ND, bowel sounds + Extremities: Left lower extremity edema and chronic skin changes >>Rt    The results of significant diagnostics from this hospitalization (including imaging, microbiology, ancillary and laboratory) are listed below for reference.     Microbiology: Recent Results (from the past 240 hour(s))  Resp Panel by RT-PCR (Flu A&B, Covid) Nasopharyngeal Swab     Status: None   Collection Time: 08/19/21  7:09 PM   Specimen: Nasopharyngeal Swab; Nasopharyngeal(NP) swabs in vial transport medium  Result Value Ref Range Status   SARS Coronavirus 2 by RT PCR NEGATIVE NEGATIVE Final    Comment: (NOTE) SARS-CoV-2 target nucleic acids are NOT DETECTED.  The SARS-CoV-2 RNA is generally detectable in upper respiratory specimens during the acute phase of infection. The lowest concentration of SARS-CoV-2 viral copies this assay can detect is 138 copies/mL. A negative result does not preclude SARS-Cov-2 infection and should not be used as the sole basis for treatment or other patient management decisions. A negative result may occur with  improper specimen collection/handling, submission of specimen other than nasopharyngeal swab, presence of viral mutation(s) within the areas targeted by this assay, and inadequate number of viral copies(<138 copies/mL). A negative result must be combined with clinical observations, patient history, and epidemiological information. The expected result is Negative.  Fact Sheet for Patients:  10/19/21  Fact Sheet for Healthcare Providers:  BloggerCourse.com  This test is no t yet approved or cleared by the [place] FDA and  has been authorized for detection and/or diagnosis of SARS-CoV-2 by FDA under an Emergency Use Authorization (EUA). This EUA will remain  in effect (meaning this test can be used) for the duration of the COVID-19 declaration under Section 564(b)(1) of the Act, 21 U.S.C.section 360bbb-3(b)(1), unless the authorization is terminated  or revoked sooner.       Influenza A by PCR NEGATIVE NEGATIVE Final   Influenza B by PCR NEGATIVE NEGATIVE Final    Comment: (NOTE) The Xpert Xpress SARS-CoV-2/FLU/RSV plus assay is intended as an aid in the diagnosis of influenza from Nasopharyngeal swab specimens and should not be used as a sole basis  for treatment. Nasal washings and aspirates are unacceptable for Xpert Xpress SARS-CoV-2/FLU/RSV testing.  Fact Sheet for Patients: BloggerCourse.com  Fact Sheet for Healthcare Providers: SeriousBroker.it  This test is not yet approved or cleared by the [place] FDA and has been authorized for detection and/or diagnosis of SARS-CoV-2 by FDA under an Emergency Use Authorization (EUA). This EUA will remain in effect (meaning this test can be used) for the duration of the COVID-19 declaration under Section 564(b)(1) of the Act, 21 U.S.C. section 360bbb-3(b)(1), unless the authorization is terminated or revoked.  Performed at [facility], [address] [place], [place] [address]      Labs: BNP (last 3 results) No results for input(s): BNP in the last 8760 hours. Basic Metabolic Panel: Recent Labs  Lab 08/19/21 1820 08/20/21 0602 08/21/21 0612  NA 132* 136 137  K 3.3* 3.2* 3.4*  CL 92* 99 103  CO2 25 28 27   GLUCOSE 129* 126* 157*  BUN 23 19 15   CREATININE 1.19 1.25* 0.95  CALCIUM 8.8* 8.3* 8.2*   Liver Function Tests: Recent Labs  Lab 08/20/21 0602  AST 12*  ALT 14  ALKPHOS 52  BILITOT 1.3*  PROT 6.0*  ALBUMIN 3.1*   No results for input(s):  LIPASE, AMYLASE in the last 168 hours. No results for input(s): AMMONIA in the last 168 hours. CBC: Recent Labs  Lab 08/19/21 1820 08/20/21 0602 08/21/21 0612 08/21/21 1515 08/22/21 0648  WBC 9.0 7.0 5.6 6.0 7.3  NEUTROABS 7.3  --   --   --   --   HGB 12.7* 11.5* 11.9* 12.1* 12.2*  HCT 38.6* 33.9* 35.5* 36.9* 35.7*  MCV 89.8 91.1 89.0 89.8 89.3  PLT 195 173 198 193 194   Cardiac Enzymes: No results for input(s): CKTOTAL, CKMB, CKMBINDEX, TROPONINI in the last 168 hours. BNP: Invalid input(s): POCBNP CBG: Recent Labs  Lab 08/21/21 0745 08/21/21 1103 08/22/21 0800 08/22/21 1153  GLUCAP 156* 145* 149* 168*   D-Dimer No results for input(s): DDIMER in the last 72 hours. Hgb A1c No results for input(s): HGBA1C in the last 72 hours. Lipid Profile No results for input(s): CHOL, HDL, LDLCALC, TRIG, CHOLHDL, LDLDIRECT in the last 72 hours. Thyroid function studies No results for input(s): TSH, T4TOTAL, T3FREE, THYROIDAB in the last 72 hours.  Invalid input(s): FREET3 Anemia work up No results for input(s): VITAMINB12, FOLATE, FERRITIN, TIBC, IRON, RETICCTPCT in the last 72 hours. Urinalysis    Component Value Date/Time   COLORURINE YELLOW (A) 08/11/2019 1943   APPEARANCEUR Clear 06/29/2021 1101   LABSPEC 1.023 08/11/2019 1943   PHURINE 5.0 08/11/2019 1943   GLUCOSEU Negative 06/29/2021 1101   HGBUR SMALL (A) 08/11/2019 1943   BILIRUBINUR Negative 06/29/2021 1101   KETONESUR 20 (A) 08/11/2019 1943   PROTEINUR Negative 06/29/2021 1101   PROTEINUR 100 (A) 08/11/2019 1943   NITRITE Negative 06/29/2021 1101   NITRITE NEGATIVE 08/11/2019 1943   LEUKOCYTESUR 1+ (A) 06/29/2021 1101   LEUKOCYTESUR NEGATIVE 08/11/2019 1943   Sepsis Labs Invalid input(s): PROCALCITONIN,  WBC,  LACTICIDVEN Microbiology Recent Results (from the past 240 hour(s))  Resp Panel by RT-PCR (Flu A&B, Covid) Nasopharyngeal Swab     Status: None   Collection Time: 08/19/21  7:09 PM   Specimen:  Nasopharyngeal Swab; Nasopharyngeal(NP) swabs in vial transport medium  Result Value Ref Range Status   SARS Coronavirus 2 by RT PCR NEGATIVE NEGATIVE Final    Comment: (NOTE) SARS-CoV-2 target nucleic acids are NOT DETECTED.  The  SARS-CoV-2 RNA is generally detectable in upper respiratory specimens during the acute phase of infection. The lowest concentration of SARS-CoV-2 viral copies this assay can detect is 138 copies/mL. A negative result does not preclude SARS-Cov-2 infection and should not be used as the sole basis for treatment or other patient management decisions. A negative result may occur with  improper specimen collection/handling, submission of specimen other than nasopharyngeal swab, presence of viral mutation(s) within the areas targeted by this assay, and inadequate number of viral copies(<138 copies/mL). A negative result must be combined with clinical observations, patient history, and epidemiological information. The expected result is Negative.  Fact Sheet for Patients:  BloggerCourse.com  Fact Sheet for Healthcare Providers:  SeriousBroker.it  This test is no t yet approved or cleared by the [place] FDA and  has been authorized for detection and/or diagnosis of SARS-CoV-2 by FDA under an Emergency Use Authorization (EUA). This EUA will remain  in effect (meaning this test can be used) for the duration of the COVID-19 declaration under Section 564(b)(1) of the Act, 21 U.S.C.section 360bbb-3(b)(1), unless the authorization is terminated  or revoked sooner.       Influenza A by PCR NEGATIVE NEGATIVE Final   Influenza B by PCR NEGATIVE NEGATIVE Final    Comment: (NOTE) The Xpert Xpress SARS-CoV-2/FLU/RSV plus assay is intended as an aid in the diagnosis of influenza from Nasopharyngeal swab specimens and should not be used as a sole basis for treatment. Nasal washings and aspirates are unacceptable for  Xpert Xpress SARS-CoV-2/FLU/RSV testing.  Fact Sheet for Patients: BloggerCourse.com  Fact Sheet for Healthcare Providers: SeriousBroker.it  This test is not yet approved or cleared by the [place] FDA and has been authorized for detection and/or diagnosis of SARS-CoV-2 by FDA under an Emergency Use Authorization (EUA). This EUA will remain in effect (meaning this test can be used) for the duration of the COVID-19 declaration under Section 564(b)(1) of the Act, 21 U.S.C. section 360bbb-3(b)(1), unless the authorization is terminated or revoked.  Performed at [facility], [address], [place], [place] [address]      Time coordinating discharge: Over 30 minutes  SIGNED:   Lynn Ito, MD  [facility] 08/22/2021, 12:35 PM Pager   If 7PM-7AM, please contact night-coverage [email or web address] Password [email or web address]

## 2021-08-22 NOTE — Progress Notes (Signed)
ANTICOAGULATION CONSULT NOTE   Pharmacy Consult for IV Heparin >> Xarelto Indication: Hx of DVT  Patient Measurements: Height: 5\' 10"  (177.8 cm) Weight: 109.5 kg (241 lb 4.8 oz) IBW/kg (Calculated) : 73 Heparin Dosing Weight: 95.4 kg  Labs: Recent Labs    08/19/21 1820 08/19/21 1909 08/20/21 0602 08/20/21 0758 08/21/21 0612 08/21/21 1515 08/21/21 2255 08/22/21 0648  HGB 12.7*  --  11.5*  --  11.9* 12.1*  --  12.2*  HCT 38.6*  --  33.9*  --  35.5* 36.9*  --  35.7*  PLT 195  --  173  --  198 193  --  194  APTT  --  46*  --    < > 67*  --  61* 88*  LABPROT  --  21.2*  --   --   --   --   --   --   INR  --  1.8*  --   --   --   --   --   --   HEPARINUNFRC  --  >1.10*  --   --  >1.10*  --   --  0.71*  CREATININE 1.19  --  1.25*  --  0.95  --   --   --    < > = values in this interval not displayed.     Estimated Creatinine Clearance: 89.6 mL/min (by C-G formula based on SCr of 0.95 mg/dL).   Medical History: Past Medical History:  Diagnosis Date   Chronic deep vein thrombosis (DVT) (HCC)    Chronic low back pain    Chronic systolic heart failure (HCC)    a. TTE 2011 with EF 40-45% per notes   Diabetes mellitus without complication (HCC)    Erectile dysfunction    HLD (hyperlipidemia)    Hypertension    Hypogonadism in male    Permanent atrial fibrillation (HCC)    a. s/p DCCV 2011; b. redeveloped Afib in 2016; c. CHADS2VASc at least 5 (CHF, HTN, age x 1, DM, vascular disease); d. on Xarelto   Torn meniscus    right knee   Assessment:  3 YOM presenting with swelling of the left leg with coloration and redness. Patient has history of DVT with stent placement. Venous ultrasound shows evidence of chronic DVT. O2 sat 100% on RA. On rivaroxaban PTA. Hgb stable at 12.7, Plts WNL. Heme and vascular consulted. Pharmacy consulted to transition off of heparin infusion back onto home Xarelto   Plan:  --Stop IV heparin today at 1500 --Re-start home Xarelto today at  1700 --CBC at least every 3 days per protocol while on Xarelto   66 08/22/2021 9:39 AM

## 2021-08-24 ENCOUNTER — Encounter: Payer: Self-pay | Admitting: Vascular Surgery

## 2021-08-24 ENCOUNTER — Telehealth: Payer: Self-pay

## 2021-08-24 NOTE — Telephone Encounter (Signed)
Transition Care Management Follow-up Telephone Call Date of discharge and from where: 08/22/2021 [facility] How have you been since you were released from the hospital? Doing good swelling going down some Any questions or concerns? No  Items Reviewed: Did the pt receive and understand the discharge instructions provided? Yes  Medications obtained and verified? Yes  Other? No  Any new allergies since your discharge? No  Dietary orders reviewed? Yes Do you have support at home? Yes   Home Care and Equipment/Supplies: Were home health services ordered? not applicable If so, what is the name of the agency? N/a  Has the agency set up a time to come to the patient's home? not applicable Were any new equipment or medical supplies ordered?  No What is the name of the medical supply agency? N/a Were you able to get the supplies/equipment? not applicable Do you have any questions related to the use of the equipment or supplies? No  Functional Questionnaire: (I = Independent and D = Dependent) ADLs: I  Bathing/Dressing- I  Meal Prep- I  Eating- I  Maintaining continence- I  Transferring/Ambulation- I  Managing Meds- I  Follow up appointments reviewed:  PCP Hospital f/u appt confirmed? Yes  Scheduled to see Dr. Laural Benes on 08/31/2021 @ 8:20. Are transportation arrangements needed? No  If their condition worsens, is the pt aware to call PCP or go to the Emergency Dept.? No Was the patient provided with contact information for the PCP's office or ED? No Was to pt encouraged to call back with questions or concerns? No

## 2021-08-25 ENCOUNTER — Other Ambulatory Visit: Payer: Self-pay | Admitting: Family Medicine

## 2021-08-25 NOTE — Telephone Encounter (Signed)
Requested medication (s) are due for refill today:   Requested medication (s) are on the active medication list: Yes  Last refill:  07/29/21  Future visit scheduled: Yes  Notes to clinic:  See request.    Requested Prescriptions  Pending Prescriptions Disp Refills   tiZANidine (ZANAFLEX) 4 MG tablet [Pharmacy Med Name: TIZANIDINE HCL 4 MG TABLET] 120 tablet 1    Sig: TAKE 1 TABLET BY MOUTH EVERY 6 HOURS AS NEEDED FOR MUSCLE SPASMS.     Not Delegated - Cardiovascular:  Alpha-2 Agonists - tizanidine Failed - 08/25/2021  9:31 AM      Failed - This refill cannot be delegated      Passed - Valid encounter within last 6 months    Recent Outpatient Visits           3 weeks ago Hypotension, unspecified hypotension type   [facility], Megan P, DO   1 month ago Hypotension, unspecified hypotension type   [facility], Megan P, DO   1 month ago Acute right-sided low back pain without sciatica   [facility] [place], Megan P, DO   1 month ago Acute right-sided low back pain without sciatica   [facility] [place], Megan P, DO   1 month ago Flank pain   [facility] [place], Jake Church, NP       Future Appointments             In 6 days Dorcas Carrow, DO [facility], [facility]   In 3 months  [facility], [facility]   In 3 months Gwen Pounds, Dineen Kid, MD [facility]

## 2021-08-26 LAB — FACTOR 5 LEIDEN

## 2021-08-27 LAB — DRVVT MIX: dRVVT Mix: 51.6 s — ABNORMAL HIGH (ref 0.0–40.4)

## 2021-08-27 LAB — ANTIPHOSPHOLIPID SYNDROME EVAL, BLD
Anticardiolipin IgA: <9 APL U/mL (ref 0–11)
Anticardiolipin IgG: <9 GPL U/mL (ref 0–14)
Anticardiolipin IgM: <9 MPL U/mL (ref 0–12)
DRVVT: 57.9 s — ABNORMAL HIGH (ref 0.0–47.0)
PTT Lupus Anticoagulant: 86.8 s — ABNORMAL HIGH (ref 0.0–51.9)
Phosphatydalserine, IgA: <1 APS Units (ref 0–19)
Phosphatydalserine, IgG: 9 Units (ref 0–30)
Phosphatydalserine, IgM: <10 Units (ref 0–30)

## 2021-08-27 LAB — DRVVT CONFIRM: dRVVT Confirm: 1.1 ratio (ref 0.8–1.2)

## 2021-08-27 LAB — HEXAGONAL PHASE PHOSPHOLIPID: Hexagonal Phase Phospholipid: 8 s (ref 0–11)

## 2021-08-27 LAB — PTT-LA MIX: PTT-LA Mix: 79.7 s — ABNORMAL HIGH (ref 0.0–48.9)

## 2021-08-28 ENCOUNTER — Telehealth: Payer: Self-pay

## 2021-08-28 NOTE — Telephone Encounter (Signed)
-----   Message from Rickard Patience, MD sent at 08/27/2021 11:22 PM EDT ----- Please schedule him to follow up in 2 weeks. MD only to review results. [ I saw him during hospitalization]

## 2021-08-28 NOTE — Telephone Encounter (Signed)
Spoke with patient to offer follow up with MD. Per patient decline states he is seeing his primary on Monday to discuss results. ahs

## 2021-08-28 NOTE — Telephone Encounter (Signed)
-----   Message from Zhou Yu, MD sent at 08/27/2021 11:22 PM EDT ----- Please schedule him to follow up in 2 weeks. MD only to review results. [ I saw him during hospitalization] 

## 2021-08-29 ENCOUNTER — Other Ambulatory Visit: Payer: Self-pay | Admitting: Family Medicine

## 2021-08-29 NOTE — Telephone Encounter (Signed)
DC'd 08/22/21   stop taking at discharge Amery,Sahar CMA

## 2021-08-31 ENCOUNTER — Other Ambulatory Visit: Payer: Self-pay

## 2021-08-31 ENCOUNTER — Encounter: Payer: Self-pay | Admitting: Family Medicine

## 2021-08-31 ENCOUNTER — Ambulatory Visit (INDEPENDENT_AMBULATORY_CARE_PROVIDER_SITE_OTHER): Payer: Medicare HMO | Admitting: Family Medicine

## 2021-08-31 VITALS — BP 97/61 | HR 67 | Temp 97.8°F | Ht 70.2 in | Wt 235.8 lb

## 2021-08-31 DIAGNOSIS — I82412 Acute embolism and thrombosis of left femoral vein: Secondary | ICD-10-CM | POA: Diagnosis not present

## 2021-08-31 DIAGNOSIS — Z23 Encounter for immunization: Secondary | ICD-10-CM | POA: Diagnosis not present

## 2021-08-31 DIAGNOSIS — L03116 Cellulitis of left lower limb: Secondary | ICD-10-CM

## 2021-08-31 DIAGNOSIS — E1159 Type 2 diabetes mellitus with other circulatory complications: Secondary | ICD-10-CM | POA: Diagnosis not present

## 2021-08-31 DIAGNOSIS — Z86718 Personal history of other venous thrombosis and embolism: Secondary | ICD-10-CM | POA: Diagnosis not present

## 2021-08-31 LAB — PROTHROMBIN GENE MUTATION

## 2021-08-31 LAB — BAYER DCA HB A1C WAIVED: HB A1C (BAYER DCA - WAIVED): 6.6 % — ABNORMAL HIGH (ref 4.8–5.6)

## 2021-08-31 MED ORDER — BENAZEPRIL HCL 20 MG PO TABS: 20.0000 mg | ORAL_TABLET | Freq: Every day | ORAL | 1 refills | Status: DC

## 2021-08-31 MED ORDER — ALBUTEROL SULFATE HFA 108 (90 BASE) MCG/ACT IN AERS: 2.0000 | INHALATION_SPRAY | RESPIRATORY_TRACT | 3 refills | Status: DC | PRN

## 2021-08-31 NOTE — Assessment & Plan Note (Signed)
No active DVT. Continue xarelto. Continue to follow with hematology. Call with any concerns.

## 2021-08-31 NOTE — Progress Notes (Signed)
BP 97/61   Pulse 67   Temp 97.8 F (36.6 C) (Oral)   Ht 5' 10.2" (1.783 m)   Wt 235 lb 12.8 oz (107 kg)   SpO2 97%   BMI 33.64 kg/m    Subjective:    Patient ID: Jeff Mealing., male    DOB: Dec 16, 1950, 70 y.o.   MRN: [id number]  HPI: Jeff Bozman. is a 70 y.o. male  Chief Complaint  Patient presents with   Diabetes   Hospitalization Follow-up   Transition of Care Hospital Follow up.    Hospital/Facility:[facility] D/C Physician: Dr. Marylu Lund D/C Date: 08/22/21   Records Requested: 08/31/21 Records Received: 08/31/21 Records Reviewed: 08/31/21   Diagnoses on Discharge: L thigh cellulitis,    Date of interactive Contact within 48 hours of discharge: 08/24/21 Contact was through: phone   Date of 7 day or 14 day face-to-face visit:  08/31/21  within 14 days       Outpatient Encounter Medications as of 08/31/2021  Medication Sig   albuterol (VENTOLIN HFA) 108 (90 Base) MCG/ACT inhaler Inhale 2 puffs into the lungs every 4 (four) hours as needed for wheezing or shortness of breath.   amLODipine (NORVASC) 5 MG tablet Take 5 mg by mouth daily.   atorvastatin (LIPITOR) 20 MG tablet TAKE 1/2 TABLET BY MOUTH DAILY   benazepril (LOTENSIN) 20 MG tablet Take 1 tablet (20 mg total) by mouth daily.   dapagliflozin propanediol (FARXIGA) 10 MG TABS tablet Take 1 tablet (10 mg total) by mouth daily before breakfast.   fluticasone (FLONASE) 50 MCG/ACT nasal spray Place into both nostrils daily as needed for allergies or rhinitis.   furosemide (LASIX) 20 MG tablet TAKE 1 TABLET BY MOUTH EVERY DAY   metFORMIN (GLUCOPHAGE-XR) 500 MG 24 hr tablet Take 2 tablets (1,000 mg total) by mouth 2 (two) times daily as needed.   metoprolol tartrate (LOPRESSOR) 100 MG tablet TAKE 1 TABLET BY MOUTH TWICE A DAY   rivaroxaban (XARELTO) 20 MG TABS tablet Take 1 tablet (20 mg total) by mouth daily.   tiZANidine (ZANAFLEX) 4 MG tablet TAKE 1 TABLET BY MOUTH EVERY 6 HOURS AS NEEDED FOR MUSCLE SPASMS.     No facility-administered encounter medications on file as of 08/31/2021.  Per Hospitalist: "Jeff Willett Lefeber. is a 70 y.o. male seen in ed with complaints of left leg swelling that started acutely . Per patient report patient has a history of blood clots and has been on blood thinner for a long time.  Patient had an IVC filter that was removed in 2019 patient has had multiple angiograms angioplasty and stents to his IVC by vascular surgeon. He went to his dermatologist office today and they felt his left leg was much more swollen than the right and told him to come to the ER for evaluation.  There was concern for blood clot.  Vascular surgery was consulted.  Hematology was also consulted.   Venous ultrasound was obtained revealing 1. Stable chronic nonocclusive post thrombotic changes in the popliteal and femoral veins. 2. No evidence of acute superimposed DVT.   Patient eventually had status post venogram on 10/14 and no new clots were found.  His Xarelto was resumed today.  He was treated for cellulitis of his left thigh with IV antibiotics.   History of DVT Lt thigh cellulitis Was treated with IV Rocephin for cellulitis transition to p.o. on discharge Status post venogram no acute findings , on 10/14 by Dr. Gilda Crease  F/u vascular surgery as outpatient Continue ECS Hematology was consulted and needs to follow-up in 1 week for Vascular to see pt for further evaluation. Pt told me suppose to have CT done by his doctor at [facility] in 2 weeks...will discuss with vascular to see if should be obtained here for further diagnostic evaluation Patient does have evidence of post thrombotic syndrome.  Will need to wear his compression stockings as outpatient Xarelto on hold for possible vascular work-up i.e. venous 10/14 venogram today, keep iv heparin one more day per vascular surgery post venogram via chat and transition to po a/c Cellulitis mildly improved, will continue with iv rocephin F/u with  hematology as outpatient Dr. Cathie Hoops for hypercoagulable work-up as outpatient Xarelto was resumed since it was not failure   Hypertension- Continue home meds   DM type II Continue home meds   Chronic afib  Continue Xarelto   Hypokalemia Replaced"     Diagnostic Tests Reviewed: CLINICAL DATA:  Edema, redness, history of chronic DVT   EXAM: LEFT LOWER EXTREMITY VENOUS DOPPLER ULTRASOUND   TECHNIQUE: Gray-scale sonography with compression, as well as color and duplex ultrasound, were performed to evaluate the deep venous system(s) from the level of the common femoral vein through the popliteal and proximal calf veins.   COMPARISON:  08/17/2018   FINDINGS: VENOUS   Stable hypoechoic wall thickening in the popliteal vein and midportion of the femoral vein with incomplete compressibility but continued antegrade flow signal on color Doppler. Normal compressibility of the common femoral, as well as the visualized calf veins. Visualized portions of profunda femoral vein and great saphenous vein unremarkable. No filling defects to suggest acute DVT on grayscale or color Doppler imaging. Doppler waveforms show normal direction of venous flow, normal respiratory phasicity and response to augmentation.   Limited views of the contralateral common femoral vein are unremarkable.   OTHER   Subcutaneous edema in the calf.   Limitations: none   IMPRESSION: 1. Stable chronic nonocclusive post thrombotic changes in the popliteal and femoral veins. 2. No evidence of acute superimposed DVT.   Disposition: Home   Consults: Hematology, Vascular   Discharge Instructions:  Follow up with PCP in 1 week Please obtain BMP/CBC in one week Please follow up vascular surgery as scheduled Follow-up with hematology in 1 week   Disease/illness Education: Discussed today   Home Health/Community Services Discussions/Referrals:  N/A   Establishment or re-establishment of referral orders  for community resources: N/A   Discussion with other health care providers: None   Assessment and Support of treatment regimen adherence: Good   Appointments Coordinated with: Patient   Education for self-management, independent living, and ADLs: Discussed today  Since getting out of the hospital, Jeff [name] has been feeling better. His redness is gone. His swelling is still there ,but controllable with his compression stocking. He is otherwise feeling well. He has finished his keflex and is feeling well.    DIABETES Hypoglycemic episodes:no Polydipsia/polyuria: no Visual disturbance: no Chest pain: no Paresthesias: no Glucose Monitoring: yes             Accucheck frequency: Not Checking             Fasting glucose: 120 Taking Insulin?: no Blood Pressure Monitoring: not checking Retinal Examination: Up to Date Foot Exam: Up to Date Diabetic Education: Completed Pneumovax: Up to Date Influenza: Up to Date Aspirin: yes  Relevant past medical, surgical, family and social history reviewed and updated as indicated. Interim medical history since our  last visit reviewed. Allergies and medications reviewed and updated.  Review of Systems  Constitutional: Negative.   Respiratory: Negative.    Cardiovascular: Negative.   Gastrointestinal: Negative.   Musculoskeletal: Negative.   Psychiatric/Behavioral: Negative.     Per HPI unless specifically indicated above     Objective:    BP 97/61   Pulse 67   Temp 97.8 F (36.6 C) (Oral)   Ht 5' 10.2" (1.783 m)   Wt 235 lb 12.8 oz (107 kg)   SpO2 97%   BMI 33.64 kg/m   Wt Readings from Last 3 Encounters:  08/31/21 235 lb 12.8 oz (107 kg)  08/21/21 241 lb 4.8 oz (109.5 kg)  08/03/21 234 lb (106.1 kg)    Physical Exam Vitals and nursing note reviewed.  Constitutional:      General: He is not in acute distress.    Appearance: Normal appearance. He is not ill-appearing, toxic-appearing or diaphoretic.  HENT:     Head: Normocephalic  and atraumatic.     Right Ear: External ear normal.     Left Ear: External ear normal.     Nose: Nose normal.     Mouth/Throat:     Mouth: Mucous membranes are moist.     Pharynx: Oropharynx is clear.  Eyes:     General: No scleral icterus.       Right eye: No discharge.        Left eye: No discharge.     Extraocular Movements: Extraocular movements intact.     Conjunctiva/sclera: Conjunctivae normal.     Pupils: Pupils are equal, round, and reactive to light.  Cardiovascular:     Rate and Rhythm: Normal rate and regular rhythm.     Pulses: Normal pulses.     Heart sounds: Normal heart sounds. No murmur heard.   No friction rub. No gallop.  Pulmonary:     Effort: Pulmonary effort is normal. No respiratory distress.     Breath sounds: Normal breath sounds. No stridor. No wheezing, rhonchi or rales.  Chest:     Chest wall: No tenderness.  Musculoskeletal:        General: Normal range of motion.     Cervical back: Normal range of motion and neck supple.  Skin:    General: Skin is warm and dry.     Capillary Refill: Capillary refill takes less than 2 seconds.     Coloration: Skin is not jaundiced or pale.     Findings: No bruising, erythema, lesion or rash.  Neurological:     General: No focal deficit present.     Mental Status: He is alert and oriented to person, place, and time. Mental status is at baseline.  Psychiatric:        Mood and Affect: Mood normal.        Behavior: Behavior normal.        Thought Content: Thought content normal.        Judgment: Judgment normal.    Results for orders placed or performed in visit on 08/31/21  Bayer DCA Hb A1c Waived  Result Value Ref Range   HB A1C (BAYER DCA - WAIVED) 6.6 (H) 4.8 - 5.6 %      Assessment & Plan:   Problem List Items Addressed This Visit       Cardiovascular and Mediastinum   Acute deep vein thrombosis (DVT) of femoral vein of left lower extremity (HCC)    No active DVT. Continue xarelto. Continue to  follow  with hematology. Call with any concerns.       Relevant Medications   benazepril (LOTENSIN) 20 MG tablet     Endocrine   Diabetes mellitus (HCC)    Doing great with A1c of 6.6. Continue current regimen. Continue to monitor. Call with any concerns.       Relevant Medications   benazepril (LOTENSIN) 20 MG tablet   Other Relevant Orders   Bayer DCA Hb A1c Waived (Completed)   Basic metabolic panel   Other Visit Diagnoses     Cellulitis of left thigh    -  Primary   Resolved. Leg back to normal. Continue to monitor.   Need for influenza vaccination       Relevant Orders   Flu Vaccine QUAD High Dose(Fluad) (Completed)        Follow up plan: Return in about 3 months (around 12/01/2021).   >30 minutes spent with patient today

## 2021-08-31 NOTE — Assessment & Plan Note (Signed)
Doing great with A1c of 6.6. Continue current regimen. Continue to monitor. Call with any concerns.  

## 2021-09-01 ENCOUNTER — Ambulatory Visit: Payer: Medicare HMO | Admitting: Oncology

## 2021-09-01 LAB — BASIC METABOLIC PANEL
BUN/Creatinine Ratio: 15 (ref 10–24)
BUN: 18 mg/dL (ref 8–27)
CO2: 24 mmol/L (ref 20–29)
Calcium: 8.9 mg/dL (ref 8.6–10.2)
Chloride: 101 mmol/L (ref 96–106)
Creatinine, Ser: 1.22 mg/dL (ref 0.76–1.27)
Glucose: 192 mg/dL — ABNORMAL HIGH (ref 70–99)
Potassium: 5.1 mmol/L (ref 3.5–5.2)
Sodium: 140 mmol/L (ref 134–144)
eGFR: 64 mL/min/{1.73_m2} (ref >59)

## 2021-09-01 LAB — CBC WITH DIFFERENTIAL/PLATELET
Basophils Absolute: 0.1 10*3/uL (ref 0.0–0.2)
Basos: 1 %
EOS (ABSOLUTE): 0.1 10*3/uL (ref 0.0–0.4)
Eos: 1 %
Hematocrit: 37.3 % — ABNORMAL LOW (ref 37.5–51.0)
Hemoglobin: 12 g/dL — ABNORMAL LOW (ref 13.0–17.7)
Immature Grans (Abs): 0.1 10*3/uL (ref 0.0–0.1)
Immature Granulocytes: 1 %
Lymphocytes Absolute: 0.7 10*3/uL (ref 0.7–3.1)
Lymphs: 13 %
MCH: 29.6 pg (ref 26.6–33.0)
MCHC: 32.2 g/dL (ref 31.5–35.7)
MCV: 92 fL (ref 79–97)
Monocytes Absolute: 0.6 10*3/uL (ref 0.1–0.9)
Monocytes: 10 %
Neutrophils Absolute: 4.2 10*3/uL (ref 1.4–7.0)
Neutrophils: 74 %
Platelets: 288 10*3/uL (ref 150–450)
RBC: 4.05 x10E6/uL — ABNORMAL LOW (ref 4.14–5.80)
RDW: 14 % (ref 11.6–15.4)
WBC: 5.6 10*3/uL (ref 3.4–10.8)

## 2021-09-18 ENCOUNTER — Other Ambulatory Visit: Payer: Self-pay | Admitting: Family Medicine

## 2021-09-18 NOTE — Telephone Encounter (Signed)
Requested medications are due for refill today NO  Requested medications are on the active medication list NO  Last refill 06/19/21  Last visit 08/31/21  Future visit scheduled 12/01/21  Notes to clinic  Dose inconsistent with current med list, please assess.  Requested Prescriptions  Pending Prescriptions Disp Refills   benazepril (LOTENSIN) 40 MG tablet [Pharmacy Med Name: BENAZEPRIL HCL 40 MG TABLET] 90 tablet 0    Sig: TAKE 1 TABLET (40 MG TOTAL) BY MOUTH DAILY. PLEASE CALL TO MAKE AN APPOINTMENT FOR MORE REFILLS     Cardiovascular:  ACE Inhibitors Passed - 09/18/2021  2:45 AM      Passed - Cr in normal range and within 180 days    Creatinine  Date Value Ref Range Status  04/06/2013 0.83 0.60 - 1.30 mg/dL Final   Creatinine, Ser  Date Value Ref Range Status  08/31/2021 1.22 0.76 - 1.27 mg/dL Final          Passed - K in normal range and within 180 days    Potassium  Date Value Ref Range Status  08/31/2021 5.1 3.5 - 5.2 mmol/L Final  04/06/2013 4.0 3.5 - 5.1 mmol/L Final          Passed - Patient is not pregnant      Passed - Last BP in normal range    BP Readings from Last 1 Encounters:  08/31/21 97/61          Passed - Valid encounter within last 6 months    Recent Outpatient Visits           2 weeks ago Cellulitis of left thigh   [facility] [place], Megan P, DO   1 month ago Hypotension, unspecified hypotension type   [facility], Megan P, DO   2 months ago Hypotension, unspecified hypotension type   [facility], Megan P, DO   2 months ago Acute right-sided low back pain without sciatica   [facility], Megan P, DO   2 months ago Acute right-sided low back pain without sciatica   [facility] Dorcas Carrow, DO       Future Appointments             In 2 months  [facility]   In 2 months Laural Benes, Megan P, DO [facility], [facility]    In 3 months Deirdre Evener, MD [facility]

## 2021-09-27 ENCOUNTER — Other Ambulatory Visit: Payer: Self-pay | Admitting: Cardiovascular Disease

## 2021-11-25 ENCOUNTER — Telehealth: Payer: Self-pay | Admitting: Family Medicine

## 2021-11-25 NOTE — Telephone Encounter (Signed)
Paient called in wants to cancel AWV for 01/23

## 2021-11-25 NOTE — Telephone Encounter (Signed)
Appt has been cancelled.  

## 2021-11-27 ENCOUNTER — Ambulatory Visit: Payer: Medicare HMO

## 2021-11-30 ENCOUNTER — Ambulatory Visit: Payer: Medicare HMO

## 2021-12-01 ENCOUNTER — Ambulatory Visit: Payer: Medicare HMO | Admitting: Family Medicine

## 2021-12-01 ENCOUNTER — Other Ambulatory Visit: Payer: Self-pay | Admitting: Family Medicine

## 2021-12-01 NOTE — Telephone Encounter (Signed)
Requested Prescriptions  Pending Prescriptions Disp Refills   metFORMIN (GLUCOPHAGE-XR) 500 MG 24 hr tablet [Pharmacy Med Name: METFORMIN HCL ER 500 MG TABLET] 360 tablet 1    Sig: TAKE 2 TABLETS (1,000 MG TOTAL) BY MOUTH 2 (TWO) TIMES DAILY AS NEEDED.     Endocrinology:  Diabetes - Biguanides Passed - 12/01/2021  1:31 AM      Passed - Cr in normal range and within 360 days    Creatinine  Date Value Ref Range Status  04/06/2013 0.83 0.60 - 1.30 mg/dL Final   Creatinine, Ser  Date Value Ref Range Status  08/31/2021 1.22 0.76 - 1.27 mg/dL Final         Passed - HBA1C is between 0 and 7.9 and within 180 days    HB A1C (BAYER DCA - WAIVED)  Date Value Ref Range Status  08/31/2021 6.6 (H) 4.8 - 5.6 % Final    Comment:             Prediabetes: 5.7 - 6.4          Diabetes: >6.4          Glycemic control for adults with diabetes: <7.0          Passed - eGFR in normal range and within 360 days    EGFR (African American)  Date Value Ref Range Status  04/06/2013 >60  Final   GFR calc Af Amer  Date Value Ref Range Status  03/13/2020 63 >59 mL/min/1.73 Final    Comment:    **[facility] currently reports eGFR in compliance with the current**   recommendations of the [employer]. [facility] will   update reporting as new guidelines are published from the NKF-ASN   Task force.    EGFR (Non-African Amer.)  Date Value Ref Range Status  04/06/2013 >60  Final    Comment:    eGFR values <57m/min/1.73 m2 may be an indication of chronic kidney disease (CKD). Calculated eGFR is useful in patients with stable renal function. The eGFR calculation will not be reliable in acutely ill patients when serum creatinine is changing rapidly. It is not useful in  patients on dialysis. The eGFR calculation may not be applicable to patients at the low and high extremes of body sizes, pregnant women, and vegetarians.    GFR, Estimated  Date Value Ref Range Status  08/21/2021 >60 >60  mL/min Final    Comment:    (NOTE) Calculated using the CKD-EPI Creatinine Equation (2021)    eGFR  Date Value Ref Range Status  08/31/2021 64 >59 mL/min/1.73 Final         Passed - Valid encounter within last 6 months    Recent Outpatient Visits          3 months ago Cellulitis of left thigh   [facility] [place]   4 months ago [facility]otension, unspecified hypotension type   [facility]   4 months ago Hypotension, unspecified hypotension type   [facility]   4 months ago Acute right-sided low back pain without sciatica   [facility], [facility]   4 months ago Acute right-sided low back pain without sciatica   [facility]      Future Appointments            In 1 week J[facility]ynetta Emer[facility]

## 2021-12-14 ENCOUNTER — Other Ambulatory Visit: Payer: Self-pay

## 2021-12-14 ENCOUNTER — Encounter: Payer: Self-pay | Admitting: Family Medicine

## 2021-12-14 ENCOUNTER — Ambulatory Visit (INDEPENDENT_AMBULATORY_CARE_PROVIDER_SITE_OTHER): Payer: Medicare HMO | Admitting: Family Medicine

## 2021-12-14 VITALS — BP 121/76 | HR 71 | Wt 249.8 lb

## 2021-12-14 DIAGNOSIS — D582 Other hemoglobinopathies: Secondary | ICD-10-CM | POA: Diagnosis not present

## 2021-12-14 DIAGNOSIS — I129 Hypertensive chronic kidney disease with stage 1 through stage 4 chronic kidney disease, or unspecified chronic kidney disease: Secondary | ICD-10-CM

## 2021-12-14 DIAGNOSIS — E782 Mixed hyperlipidemia: Secondary | ICD-10-CM | POA: Diagnosis not present

## 2021-12-14 DIAGNOSIS — E1159 Type 2 diabetes mellitus with other circulatory complications: Secondary | ICD-10-CM

## 2021-12-14 LAB — BAYER DCA HB A1C WAIVED: HB A1C (BAYER DCA - WAIVED): 8.5 % — ABNORMAL HIGH (ref 4.8–5.6)

## 2021-12-14 MED ORDER — ATORVASTATIN CALCIUM 20 MG PO TABS: 10.0000 mg | ORAL_TABLET | Freq: Every day | ORAL | 1 refills | Status: DC

## 2021-12-14 MED ORDER — TIZANIDINE HCL 4 MG PO TABS: 4.0000 mg | ORAL_TABLET | Freq: Four times a day (QID) | ORAL | 1 refills | Status: DC | PRN

## 2021-12-14 MED ORDER — BENAZEPRIL HCL 20 MG PO TABS: 20.0000 mg | ORAL_TABLET | Freq: Every day | ORAL | 1 refills | Status: DC

## 2021-12-14 MED ORDER — ALBUTEROL SULFATE HFA 108 (90 BASE) MCG/ACT IN AERS: 2.0000 | INHALATION_SPRAY | RESPIRATORY_TRACT | 3 refills | Status: DC | PRN

## 2021-12-14 MED ORDER — FLUTICASONE PROPIONATE 50 MCG/ACT NA SUSP: 2.0000 | Freq: Every day | NASAL | 12 refills | Status: DC | PRN

## 2021-12-14 MED ORDER — METFORMIN HCL ER 500 MG PO TB24: 1000.0000 mg | ORAL_TABLET | Freq: Two times a day (BID) | ORAL | 1 refills | Status: DC | PRN

## 2021-12-14 NOTE — Assessment & Plan Note (Signed)
Under good control on current regimen. Continue current regimen. Continue to monitor. Call with any concerns. Refills given. Labs drawn today.   

## 2021-12-14 NOTE — Assessment & Plan Note (Signed)
Rechecking labs today. Await results. Treat as needed.  °

## 2021-12-14 NOTE — Progress Notes (Signed)
BP 121/76    Pulse 71    Wt 249 lb 12.8 oz (113.3 kg)    SpO2 100%    BMI 35.64 kg/m    Subjective:    Patient ID: Jeff Lips., male    DOB: May 25, 1951, 71 y.o.   MRN: [id number]  HPI: Jeff Odaniel. is a 71 y.o. male  Chief Complaint  Patient presents with   Diabetes   HYPERTENSION / [place] Satisfied with current treatment? yes Duration of hypertension: chronic BP monitoring frequency: not checking BP medication side effects: no Past BP meds: metoprolol, lasix, cardura, benazepril, amlodipine Duration of hyperlipidemia: chronic Cholesterol medication side effects: no Cholesterol supplements: none Past cholesterol medications: atorvastatin Medication compliance: excellent compliance Aspirin: yes Recent stressors: no Recurrent headaches: no Visual changes: no Palpitations: no Dyspnea: no Chest pain: no Lower extremity edema: no Dizzy/lightheaded: no  DIABETES Hypoglycemic episodes:no Polydipsia/polyuria: no Visual disturbance: no Chest pain: no Paresthesias: no Glucose Monitoring: yes  Accucheck frequency: Daily  Fasting glucose: 130s Taking Insulin?: no Blood Pressure Monitoring: not checking Retinal Examination: Up to Date Foot Exam: Up to Date Diabetic Education: Completed Pneumovax: Up to Date Influenza: Up to Date Aspirin: yes   Relevant past medical, surgical, family and social history reviewed and updated as indicated. Interim medical history since our last visit reviewed. Allergies and medications reviewed and updated.  Review of Systems  Constitutional: Negative.   Respiratory: Negative.    Cardiovascular: Negative.   Gastrointestinal: Negative.   Musculoskeletal: Negative.   Neurological: Negative.   Psychiatric/Behavioral: Negative.     Per HPI unless specifically indicated above     Objective:    BP 121/76    Pulse 71    Wt 249 lb 12.8 oz (113.3 kg)    SpO2 100%    BMI 35.64 kg/m   Wt Readings from Last 3  Encounters:  12/14/21 249 lb 12.8 oz (113.3 kg)  08/31/21 235 lb 12.8 oz (107 kg)  08/21/21 241 lb 4.8 oz (109.5 kg)    Physical Exam Vitals and nursing note reviewed.  Constitutional:      General: He is not in acute distress.    Appearance: Normal appearance. He is not ill-appearing, toxic-appearing or diaphoretic.  HENT:     Head: Normocephalic and atraumatic.     Right Ear: External ear normal.     Left Ear: External ear normal.     Nose: Nose normal.     Mouth/Throat:     Mouth: Mucous membranes are moist.     Pharynx: Oropharynx is clear.  Eyes:     General: No scleral icterus.       Right eye: No discharge.        Left eye: No discharge.     Extraocular Movements: Extraocular movements intact.     Conjunctiva/sclera: Conjunctivae normal.     Pupils: Pupils are equal, round, and reactive to light.  Cardiovascular:     Rate and Rhythm: Normal rate and regular rhythm.     Pulses: Normal pulses.     Heart sounds: Normal heart sounds. No murmur heard.   No friction rub. No gallop.  Pulmonary:     Effort: Pulmonary effort is normal. No respiratory distress.     Breath sounds: Normal breath sounds. No stridor. No wheezing, rhonchi or rales.  Chest:     Chest wall: No tenderness.  Musculoskeletal:        General: Normal range of motion.     Cervical  back: Normal range of motion and neck supple.  Skin:    General: Skin is warm and dry.     Capillary Refill: Capillary refill takes less than 2 seconds.     Coloration: Skin is not jaundiced or pale.     Findings: No bruising, erythema, lesion or rash.  Neurological:     General: No focal deficit present.     Mental Status: He is alert and oriented to person, place, and time. Mental status is at baseline.  Psychiatric:        Mood and Affect: Mood normal.        Behavior: Behavior normal.        Thought Content: Thought content normal.        Judgment: Judgment normal.    Results for orders placed or performed in visit  on 12/14/21  Bayer DCA Hb A1c Waived  Result Value Ref Range   HB A1C (BAYER DCA - WAIVED) 8.5 (H) 4.8 - 5.6 %      Assessment & Plan:   Problem List Items Addressed This Visit       Endocrine   Diabetes mellitus ([place])    Not doing great with A1c of 8.5. Will work on diet and exercise. Continue metformin. Will check on cost of farxiga. Recheck 3 months. Call with any concerns.       Relevant Medications   Aspirin 325 MG CAPS   benazepril (LOTENSIN) 20 MG tablet   metFORMIN (GLUCOPHAGE-XR) 500 MG 24 hr tablet   atorvastatin (LIPITOR) 20 MG tablet   Other Relevant Orders   Comprehensive metabolic panel   CBC with Differential/Platelet   Bayer DCA Hb A1c Waived (Completed)     Genitourinary   Hypertensive renal disease    Under good control on current regimen. Continue current regimen. Continue to monitor. Call with any concerns. Refills given. Labs drawn today.       Relevant Orders   Comprehensive metabolic panel   CBC with Differential/Platelet     Other   Mixed hyperlipidemia - Primary    Under good control on current regimen. Continue current regimen. Continue to monitor. Call with any concerns. Refills given. Labs drawn today.       Relevant Medications   Aspirin 325 MG CAPS   amLODipine (NORVASC) 5 MG tablet   doxazosin (CARDURA) 2 MG tablet   benazepril (LOTENSIN) 20 MG tablet   atorvastatin (LIPITOR) 20 MG tablet   Other Relevant Orders   Comprehensive metabolic panel   CBC with Differential/Platelet   Lipid Panel w/o Chol/HDL Ratio   Elevated hemoglobin (HCC)    Rechecking labs today. Await results. Treat as needed.       Relevant Orders   Comprehensive metabolic panel   CBC with Differential/Platelet     Follow up plan: Return in about 3 months (around 03/13/2022).

## 2021-12-14 NOTE — Assessment & Plan Note (Signed)
Not doing great with A1c of 8.5. Will work on diet and exercise. Continue metformin. Will check on cost of farxiga. Recheck 3 months. Call with any concerns.

## 2021-12-15 LAB — CBC WITH DIFFERENTIAL/PLATELET
Basophils Absolute: 0.1 10*3/uL (ref 0.0–0.2)
Basos: 1 %
EOS (ABSOLUTE): 0.1 10*3/uL (ref 0.0–0.4)
Eos: 1 %
Hematocrit: 50.7 % (ref 37.5–51.0)
Hemoglobin: 16.2 g/dL (ref 13.0–17.7)
Immature Grans (Abs): 0.1 10*3/uL (ref 0.0–0.1)
Immature Granulocytes: 1 %
Lymphocytes Absolute: 1 10*3/uL (ref 0.7–3.1)
Lymphs: 16 %
MCH: 27 pg (ref 26.6–33.0)
MCHC: 32 g/dL (ref 31.5–35.7)
MCV: 84 fL (ref 79–97)
Monocytes Absolute: 0.5 10*3/uL (ref 0.1–0.9)
Monocytes: 7 %
Neutrophils Absolute: 4.5 10*3/uL (ref 1.4–7.0)
Neutrophils: 74 %
Platelets: 197 10*3/uL (ref 150–450)
RBC: 6.01 x10E6/uL — ABNORMAL HIGH (ref 4.14–5.80)
RDW: 12.6 % (ref 11.6–15.4)
WBC: 6.1 10*3/uL (ref 3.4–10.8)

## 2021-12-15 LAB — COMPREHENSIVE METABOLIC PANEL WITH GFR
ALT: 15 IU/L (ref 0–44)
AST: 17 IU/L (ref 0–40)
Albumin/Globulin Ratio: 2.1 (ref 1.2–2.2)
Albumin: 4.5 g/dL (ref 3.8–4.8)
Alkaline Phosphatase: 89 IU/L (ref 44–121)
BUN/Creatinine Ratio: 29 — ABNORMAL HIGH (ref 10–24)
BUN: 39 mg/dL — ABNORMAL HIGH (ref 8–27)
Bilirubin Total: 0.3 mg/dL (ref 0.0–1.2)
CO2: 22 mmol/L (ref 20–29)
Calcium: 9.1 mg/dL (ref 8.6–10.2)
Chloride: 101 mmol/L (ref 96–106)
Creatinine, Ser: 1.34 mg/dL — ABNORMAL HIGH (ref 0.76–1.27)
Globulin, Total: 2.1 g/dL (ref 1.5–4.5)
Glucose: 225 mg/dL — ABNORMAL HIGH (ref 70–99)
Potassium: 5.3 mmol/L — ABNORMAL HIGH (ref 3.5–5.2)
Sodium: 138 mmol/L (ref 134–144)
Total Protein: 6.6 g/dL (ref 6.0–8.5)
eGFR: 57 mL/min/1.73 — ABNORMAL LOW

## 2021-12-15 LAB — LIPID PANEL W/O CHOL/HDL RATIO
Cholesterol, Total: 134 mg/dL (ref 100–199)
HDL: 39 mg/dL — ABNORMAL LOW (ref >39)
LDL Chol Calc (NIH): 82 mg/dL (ref 0–99)
Triglycerides: 63 mg/dL (ref 0–149)
VLDL Cholesterol Cal: 13 mg/dL (ref 5–40)

## 2021-12-21 ENCOUNTER — Ambulatory Visit: Payer: Medicare HMO | Admitting: Dermatology

## 2021-12-28 ENCOUNTER — Ambulatory Visit (INDEPENDENT_AMBULATORY_CARE_PROVIDER_SITE_OTHER): Payer: Medicare HMO | Admitting: Family Medicine

## 2021-12-28 ENCOUNTER — Other Ambulatory Visit: Payer: Self-pay

## 2021-12-28 ENCOUNTER — Encounter: Payer: Self-pay | Admitting: Family Medicine

## 2021-12-28 VITALS — BP 107/69 | HR 80 | Temp 97.5°F | Wt 243.4 lb

## 2021-12-28 DIAGNOSIS — J069 Acute upper respiratory infection, unspecified: Secondary | ICD-10-CM | POA: Diagnosis not present

## 2021-12-28 DIAGNOSIS — R509 Fever, unspecified: Secondary | ICD-10-CM

## 2021-12-28 LAB — VERITOR FLU A/B WAIVED
Influenza A: NEGATIVE
Influenza B: NEGATIVE

## 2021-12-28 MED ORDER — BENZONATATE 200 MG PO CAPS: 200.0000 mg | ORAL_CAPSULE | Freq: Two times a day (BID) | ORAL | 0 refills | Status: DC | PRN

## 2021-12-28 MED ORDER — HYDROCOD POLI-CHLORPHE POLI ER 10-8 MG/5ML PO SUER: 5.0000 mL | Freq: Two times a day (BID) | ORAL | 0 refills | Status: DC | PRN

## 2021-12-28 MED ORDER — PREDNISONE 50 MG PO TABS: 50.0000 mg | ORAL_TABLET | Freq: Every day | ORAL | 0 refills | Status: DC

## 2021-12-28 NOTE — Progress Notes (Signed)
BP 107/69    Pulse 80    Temp (!) 97.5 F (36.4 C)    Wt 243 lb 6.4 oz (110.4 kg)    SpO2 100%    BMI 34.73 kg/m    Subjective:    Patient ID: Jeff Lips., male    DOB: 03/15/1951, 71 y.o.   MRN: [id number]  HPI: Jeff Ticas. is a 71 y.o. male  Chief Complaint  Patient presents with   Fever    Patient states he had a fever Sunday of 101.0. patient aches and chills.    Cough    Patient has been coughing since Friday and has chest congestion. Took 2 at home COVID test both negative.    UPPER RESPIRATORY TRACT INFECTION Duration: 2 days Worst symptom: fever, body aches Fever: yes Cough: yes Shortness of breath: no Wheezing: no Chest pain: no Chest tightness: yes Chest congestion: yes Nasal congestion: yes Runny nose: yes Post nasal drip: no Sneezing: no Sore throat: no Swollen glands: no Sinus pressure: no Headache: no Face pain: no Toothache: no Ear pain: no  Ear pressure: no  Eyes red/itching:no Eye drainage/crusting: no  Vomiting: no Rash: no Fatigue: yes Sick contacts: no Strep contacts: no  Context: stable Recurrent sinusitis: no Relief with OTC cold/cough medications: no  Treatments attempted: cold/sinus   Relevant past medical, surgical, family and social history reviewed and updated as indicated. Interim medical history since our last visit reviewed. Allergies and medications reviewed and updated.  Review of Systems  Constitutional:  Positive for fatigue and fever. Negative for activity change, appetite change, chills, diaphoresis and unexpected weight change.  HENT:  Positive for congestion, postnasal drip and rhinorrhea. Negative for dental problem, drooling, ear discharge, ear pain, facial swelling, hearing loss, mouth sores, nosebleeds, sinus pressure, sinus pain, sneezing, sore throat, tinnitus and trouble swallowing.   Eyes: Negative.   Respiratory:  Positive for cough. Negative for apnea, choking, chest tightness, shortness of  breath, wheezing and stridor.   Cardiovascular: Negative.   Gastrointestinal: Negative.   Neurological: Negative.   Psychiatric/Behavioral: Negative.     Per HPI unless specifically indicated above     Objective:    BP 107/69    Pulse 80    Temp (!) 97.5 F (36.4 C)    Wt 243 lb 6.4 oz (110.4 kg)    SpO2 100%    BMI 34.73 kg/m   Wt Readings from Last 3 Encounters:  12/28/21 243 lb 6.4 oz (110.4 kg)  12/14/21 249 lb 12.8 oz (113.3 kg)  08/31/21 235 lb 12.8 oz (107 kg)    Physical Exam Vitals and nursing note reviewed.  Constitutional:      General: He is not in acute distress.    Appearance: Normal appearance. He is obese. He is not ill-appearing, toxic-appearing or diaphoretic.  HENT:     Head: Normocephalic and atraumatic.     Right Ear: Tympanic membrane, ear canal and external ear normal.     Left Ear: Tympanic membrane, ear canal and external ear normal.     Nose: Nose normal. No congestion or rhinorrhea.     Mouth/Throat:     Mouth: Mucous membranes are moist.     Pharynx: Oropharynx is clear. No oropharyngeal exudate or posterior oropharyngeal erythema.  Eyes:     General: No scleral icterus.       Right eye: No discharge.        Left eye: No discharge.     Extraocular  Movements: Extraocular movements intact.     Conjunctiva/sclera: Conjunctivae normal.     Pupils: Pupils are equal, round, and reactive to light.  Cardiovascular:     Rate and Rhythm: Normal rate and regular rhythm.     Pulses: Normal pulses.     Heart sounds: Normal heart sounds. No murmur heard.   No friction rub. No gallop.  Pulmonary:     Effort: Pulmonary effort is normal. No respiratory distress.     Breath sounds: Normal breath sounds. No stridor. No wheezing, rhonchi or rales.  Chest:     Chest wall: No tenderness.  Musculoskeletal:        General: Normal range of motion.     Cervical back: Normal range of motion and neck supple.  Skin:    General: Skin is warm and dry.     Capillary  Refill: Capillary refill takes less than 2 seconds.     Coloration: Skin is not jaundiced or pale.     Findings: No bruising, erythema, lesion or rash.  Neurological:     General: No focal deficit present.     Mental Status: He is alert and oriented to person, place, and time. Mental status is at baseline.  Psychiatric:        Mood and Affect: Mood normal.        Behavior: Behavior normal.        Thought Content: Thought content normal.        Judgment: Judgment normal.    Results for orders placed or performed in visit on 12/14/21  Comprehensive metabolic panel  Result Value Ref Range   Glucose 225 (H) 70 - 99 mg/dL   BUN 39 (H) 8 - 27 mg/dL   Creatinine, Ser 1.34 (H) 0.76 - 1.27 mg/dL   eGFR 57 (L) >59 mL/min/1.73   BUN/Creatinine Ratio 29 (H) 10 - 24   Sodium 138 134 - 144 mmol/L   Potassium 5.3 (H) 3.5 - 5.2 mmol/L   Chloride 101 96 - 106 mmol/L   CO2 22 20 - 29 mmol/L   Calcium 9.1 8.6 - 10.2 mg/dL   Total Protein 6.6 6.0 - 8.5 g/dL   Albumin 4.5 3.8 - 4.8 g/dL   Globulin, Total 2.1 1.5 - 4.5 g/dL   Albumin/Globulin Ratio 2.1 1.2 - 2.2   Bilirubin Total 0.3 0.0 - 1.2 mg/dL   Alkaline Phosphatase 89 44 - 121 IU/L   AST 17 0 - 40 IU/L   ALT 15 0 - 44 IU/L  CBC with Differential/Platelet  Result Value Ref Range   WBC 6.1 3.4 - 10.8 x10E3/uL   RBC 6.01 (H) 4.14 - 5.80 x10E6/uL   Hemoglobin 16.2 13.0 - 17.7 g/dL   Hematocrit 50.7 37.5 - 51.0 %   MCV 84 79 - 97 fL   MCH 27.0 26.6 - 33.0 pg   MCHC 32.0 31.5 - 35.7 g/dL   RDW 12.6 11.6 - 15.4 %   Platelets 197 150 - 450 x10E3/uL   Neutrophils 74 Not Estab. %   Lymphs 16 Not Estab. %   Monocytes 7 Not Estab. %   Eos 1 Not Estab. %   Basos 1 Not Estab. %   Neutrophils Absolute 4.5 1.4 - 7.0 x10E3/uL   Lymphocytes Absolute 1.0 0.7 - 3.1 x10E3/uL   Monocytes Absolute 0.5 0.1 - 0.9 x10E3/uL   EOS (ABSOLUTE) 0.1 0.0 - 0.4 x10E3/uL   Basophils Absolute 0.1 0.0 - 0.2 x10E3/uL   Immature Granulocytes 1 Not Estab. %  Immature Grans (Abs) 0.1 0.0 - 0.1 x10E3/uL  Bayer DCA Hb A1c Waived  Result Value Ref Range   HB A1C (BAYER DCA - WAIVED) 8.5 (H) 4.8 - 5.6 %  Lipid Panel w/o Chol/HDL Ratio  Result Value Ref Range   Cholesterol, Total 134 100 - 199 mg/dL   Triglycerides 63 0 - 149 mg/dL   HDL 39 (L) >39 mg/dL   VLDL Cholesterol Cal 13 5 - 40 mg/dL   LDL Chol Calc (NIH) 82 0 - 99 mg/dL      Assessment & Plan:   Problem List Items Addressed This Visit   None Visit Diagnoses     Upper respiratory tract infection, unspecified type    -  Primary   Will treat with prednisone, tussionex and tessalon. Call if not getting better or getting worse. COVID pending, Flu negative.    Fever, unspecified       Relevant Orders   Influenza A & B (STAT)   Novel Coronavirus, NAA ([facility])        Follow up plan: Return if symptoms worsen or fail to improve.

## 2021-12-29 LAB — NOVEL CORONAVIRUS, NAA: SARS-CoV-2, NAA: NOT DETECTED

## 2022-01-01 ENCOUNTER — Other Ambulatory Visit: Payer: Self-pay | Admitting: Cardiovascular Disease

## 2022-01-04 ENCOUNTER — Other Ambulatory Visit: Payer: Self-pay | Admitting: Cardiovascular Disease

## 2022-01-10 ENCOUNTER — Other Ambulatory Visit: Payer: Self-pay | Admitting: Family Medicine

## 2022-01-11 NOTE — Telephone Encounter (Signed)
Requested medications are due for refill today.  A bit too soon ? ?Requested medications are on the active medications list.  yes ? ?Last refill. 12/14/2021 #120 1 refill ? ?Future visit scheduled.   yes ? ?Notes to clinic.  Medication refill not delegated. ? ? ? ?Requested Prescriptions  ?Pending Prescriptions Disp Refills  ? tiZANidine (ZANAFLEX) 4 MG tablet [Pharmacy Med Name: TIZANIDINE HCL 4 MG TABLET] 120 tablet 1  ?  Sig: TAKE 1 TABLET BY MOUTH EVERY 6 HOURS AS NEEDED FOR MUSCLE SPASMS.  ?  ? Not Delegated - Cardiovascular:  Alpha-2 Agonists - tizanidine Failed - 01/10/2022  8:43 AM  ?  ?  Failed - This refill cannot be delegated  ?  ?  Passed - Valid encounter within last 6 months  ?  Recent Outpatient Visits   ? ?      ? 2 weeks ago Upper respiratory tract infection, unspecified type  ? [facility] [place], Megan P, DO  ? 4 weeks ago Mixed hyperlipidemia  ? [facility] [place], [place] P, DO  ? 4 months ago Cellulitis of left thigh  ? [facility] [place], Megan P, DO  ? 5 months ago Hypotension, unspecified hypotension type  ? [facility] [place], Megan P, DO  ? 5 months ago Hypotension, unspecified hypotension type  ? [facility] [place], [place] P, DO  ? ?  ?  ?Future Appointments   ? ?        ? In 2 months Laural Benes, Oralia Rud, DO [facility]  ? ?  ? ?  ?  ?  ?  ?

## 2022-01-21 ENCOUNTER — Ambulatory Visit (INDEPENDENT_AMBULATORY_CARE_PROVIDER_SITE_OTHER): Payer: Medicare HMO | Admitting: *Deleted

## 2022-01-21 DIAGNOSIS — Z0000 Encounter for general adult medical examination without abnormal findings: Secondary | ICD-10-CM | POA: Diagnosis not present

## 2022-01-21 NOTE — Patient Instructions (Signed)
Jeff [name] , ?Thank you for taking time to come for your Medicare Wellness Visit. I appreciate your ongoing commitment to your health goals. Please review the following plan we discussed and let me know if I can assist you in the future.  ? ?Screening recommendations/referrals: ?Colonoscopy: up to date ?Recommended yearly ophthalmology/optometry visit for glaucoma screening and checkup ?Recommended yearly dental visit for hygiene and checkup ? ?Vaccinations: ?Influenza vaccine: up to date ?Pneumococcal vaccine: up to date ?Tdap vaccine: up to date ?Shingles vaccine: Education provided    ? ?Advanced directives: on file ? ?Conditions/risks identified:  ? ?Next appointment: 03-16-2022 @ 9:00  Jeff [name] ? ?Preventive Care 71 Years and Older, Male ?Preventive care refers to lifestyle choices and visits with your health care provider that can promote health and wellness. ?What does preventive care include? ?A yearly physical exam. This is also called an annual well check. ?Dental exams once or twice a year. ?Routine eye exams. Ask your health care provider how often you should have your eyes checked. ?Personal lifestyle choices, including: ?Daily care of your teeth and gums. ?Regular physical activity. ?Eating a healthy diet. ?Avoiding tobacco and drug use. ?Limiting alcohol use. ?Practicing safe sex. ?Taking low doses of aspirin every day. ?Taking vitamin and mineral supplements as recommended by your health care provider. ?What happens during an annual well check? ?The services and screenings done by your health care provider during your annual well check will depend on your age, overall health, lifestyle risk factors, and family history of disease. ?Counseling  ?Your health care provider may ask you questions about your: ?Alcohol use. ?Tobacco use. ?Drug use. ?Emotional well-being. ?Home and relationship well-being. ?Sexual activity. ?Eating habits. ?History of falls. ?Memory and ability to understand (cognition). ?Work  and work Astronomer. ?Screening  ?You may have the following tests or measurements: ?Height, weight, and BMI. ?Blood pressure. ?Lipid and cholesterol levels. These may be checked every 5 years, or more frequently if you are over 71 years old. ?Skin check. ?Lung cancer screening. You may have this screening every year starting at age 71 if you have a 30-pack-year history of smoking and currently smoke or have quit within the past 15 years. ?Fecal occult blood test (FOBT) of the stool. You may have this test every year starting at age 71. ?Flexible sigmoidoscopy or colonoscopy. You may have a sigmoidoscopy every 5 years or a colonoscopy every 10 years starting at age 71. ?Prostate cancer screening. Recommendations will vary depending on your family history and other risks. ?Hepatitis C blood test. ?Hepatitis B blood test. ?Sexually transmitted disease (STD) testing. ?Diabetes screening. This is done by checking your blood sugar (glucose) after you have not eaten for a while (fasting). You may have this done every 1-3 years. ?Abdominal aortic aneurysm (AAA) screening. You may need this if you are a current or former smoker. ?Osteoporosis. You may be screened starting at age 71 if you are at high risk. ?Talk with your health care provider about your test results, treatment options, and if necessary, the need for more tests. ?Vaccines  ?Your health care provider may recommend certain vaccines, such as: ?Influenza vaccine. This is recommended every year. ?Tetanus, diphtheria, and acellular pertussis (Tdap, Td) vaccine. You may need a Td booster every 10 years. ?Zoster vaccine. You may need this after age 71. ?Pneumococcal 13-valent conjugate (PCV13) vaccine. One dose is recommended after age 71. ?Pneumococcal polysaccharide (PPSV23) vaccine. One dose is recommended after age 71. ?Talk to your health care provider about which screenings  and vaccines you need and how often you need them. ?This information is not intended  to replace advice given to you by your health care provider. Make sure you discuss any questions you have with your health care provider. ?Document Released: 11/21/2015 Document Revised: 07/14/2016 Document Reviewed: 08/26/2015 ?Elsevier Interactive Patient Education ? 2017 [place]. ? ?Fall Prevention in the Home ?Falls can cause injuries. They can happen to people of all ages. There are many things you can do to make your home safe and to help prevent falls. ?What can I do on the outside of my home? ?Regularly fix the edges of walkways and driveways and fix any cracks. ?Remove anything that might make you trip as you walk through a door, such as a raised step or threshold. ?Trim any bushes or trees on the path to your home. ?Use bright outdoor lighting. ?Clear any walking paths of anything that might make someone trip, such as rocks or tools. ?Regularly check to see if handrails are loose or broken. Make sure that both sides of any steps have handrails. ?Any raised decks and porches should have guardrails on the edges. ?Have any leaves, snow, or ice cleared regularly. ?Use sand or salt on walking paths during winter. ?Clean up any spills in your garage right away. This includes oil or grease spills. ?What can I do in the bathroom? ?Use night lights. ?Install grab bars by the toilet and in the tub and shower. Do not use towel bars as grab bars. ?Use non-skid mats or decals in the tub or shower. ?If you need to sit down in the shower, use a plastic, non-slip stool. ?Keep the floor dry. Clean up any water that spills on the floor as soon as it happens. ?Remove soap buildup in the tub or shower regularly. ?Attach bath mats securely with double-sided non-slip rug tape. ?Do not have throw rugs and other things on the floor that can make you trip. ?What can I do in the bedroom? ?Use night lights. ?Make sure that you have a light by your bed that is easy to reach. ?Do not use any sheets or blankets that are too big  for your bed. They should not hang down onto the floor. ?Have a firm chair that has side arms. You can use this for support while you get dressed. ?Do not have throw rugs and other things on the floor that can make you trip. ?What can I do in the kitchen? ?Clean up any spills right away. ?Avoid walking on wet floors. ?Keep items that you use a lot in easy-to-reach places. ?If you need to reach something above you, use a strong step stool that has a grab bar. ?Keep electrical cords out of the way. ?Do not use floor polish or wax that makes floors slippery. If you must use wax, use non-skid floor wax. ?Do not have throw rugs and other things on the floor that can make you trip. ?What can I do with my stairs? ?Do not leave any items on the stairs. ?Make sure that there are handrails on both sides of the stairs and use them. Fix handrails that are broken or loose. Make sure that handrails are as long as the stairways. ?Check any carpeting to make sure that it is firmly attached to the stairs. Fix any carpet that is loose or worn. ?Avoid having throw rugs at the top or bottom of the stairs. If you do have throw rugs, attach them to the floor with carpet tape. ?  Make sure that you have a light switch at the top of the stairs and the bottom of the stairs. If you do not have them, ask someone to add them for you. ?What else can I do to help prevent falls? ?Wear shoes that: ?Do not have high heels. ?Have rubber bottoms. ?Are comfortable and fit you well. ?Are closed at the toe. Do not wear sandals. ?If you use a stepladder: ?Make sure that it is fully opened. Do not climb a closed stepladder. ?Make sure that both sides of the stepladder are locked into place. ?Ask someone to hold it for you, if possible. ?Clearly mark and make sure that you can see: ?Any grab bars or handrails. ?First and last steps. ?Where the edge of each step is. ?Use tools that help you move around (mobility aids) if they are needed. These  include: ?Canes. ?Walkers. ?Scooters. ?Crutches. ?Turn on the lights when you go into a dark area. Replace any light bulbs as soon as they burn out. ?Set up your furniture so you have a clear path. Avoid moving your furn

## 2022-01-21 NOTE — Progress Notes (Signed)
? ?Subjective:  ? Jeff [name]. is a 71 y.o. male who presents for Medicare Annual/Subsequent preventive examination. ? ?I connected with  Rae Lips. on 01/21/22 by a telephone enabled telemedicine application and verified that I am speaking with the correct person using two identifiers. ?  ?I discussed the limitations of evaluation and management by telemedicine. The patient expressed understanding and agreed to proceed. ? ?Patient location: home ? ?Provider location: Tele-Health not in office ? ? ? ?Review of Systems    ? ?Cardiac Risk Factors include: advanced age (>50men, >18 women);diabetes mellitus;male gender ? ?   ?Objective:  ?  ?Today's Vitals  ? ?There is no height or weight on file to calculate BMI. ? ?Advanced Directives 01/21/2022 08/20/2021 08/19/2021 04/06/2021 11/24/2020 10/06/2019 10/06/2019  ?Does Patient Have a Medical Advance Directive? No;Yes Yes Yes No Yes Yes No  ?Type of Programmer, systems - - Healthcare Power of [place];Living will [place];Living will -  ?Does patient want to make changes to medical advance directive? - Yes (Inpatient - patient requests chaplain consult to change a medical advance directive) - - - No - Patient declined -  ?Copy of [place] in Chart? Yes - validated most recent copy scanned in chart (See row information) - - - No - copy requested No - copy requested -  ?Would patient like information on creating a medical advance directive? - - - No - Patient declined - - -  ? ? ?Current Medications (verified) ?Outpatient Encounter Medications as of 01/21/2022  ?Medication Sig  ? albuterol (VENTOLIN HFA) 108 (90 Base) MCG/ACT inhaler Inhale 2 puffs into the lungs every 4 (four) hours as needed for wheezing or shortness of breath.  ? amLODipine (NORVASC) 5 MG tablet Take 5 mg by mouth daily.  ? Aspirin 325 MG CAPS Take by mouth.  ? atorvastatin (LIPITOR) 20 MG tablet  Take 0.5 tablets (10 mg total) by mouth daily.  ? benazepril (LOTENSIN) 20 MG tablet Take 1 tablet (20 mg total) by mouth daily.  ? benzonatate (TESSALON) 200 MG capsule Take 1 capsule (200 mg total) by mouth 2 (two) times daily as needed for cough.  ? chlorpheniramine-HYDROcodone (TUSSIONEX PENNKINETIC ER) 10-8 MG/5ML Take 5 mLs by mouth every 12 (twelve) hours as needed.  ? dapagliflozin propanediol (FARXIGA) 10 MG TABS tablet Take 1 tablet (10 mg total) by mouth daily before breakfast. (Patient not taking: Reported on 12/28/2021)  ? doxazosin (CARDURA) 2 MG tablet TAKE 1 TABLET BY MOUTH 2 TIMES DAILY.  ? fluticasone (FLONASE) 50 MCG/ACT nasal spray Place 2 sprays into both nostrils daily as needed for allergies or rhinitis.  ? furosemide (LASIX) 20 MG tablet Take 1 tablet (20 mg total) by mouth daily. PLEASE CALL OFFICE TO SCHEDULE AN APPOINTMENT  ? metFORMIN (GLUCOPHAGE-XR) 500 MG 24 hr tablet Take 2 tablets (1,000 mg total) by mouth 2 (two) times daily as needed.  ? metoprolol tartrate (LOPRESSOR) 100 MG tablet TAKE 1 TABLET BY MOUTH TWICE A DAY  ? predniSONE (DELTASONE) 50 MG tablet Take 1 tablet (50 mg total) by mouth daily with breakfast.  ? rivaroxaban (XARELTO) 20 MG TABS tablet Take 1 tablet (20 mg total) by mouth daily.  ? tiZANidine (ZANAFLEX) 4 MG tablet Take 1 tablet (4 mg total) by mouth every 6 (six) hours as needed for muscle spasms.  ? ?No facility-administered encounter medications on file as of 01/21/2022.  ? ? ?  Allergies (verified) ?Patient has no known allergies.  ? ?History: ?Past Medical History:  ?Diagnosis Date  ? Chronic deep vein thrombosis (DVT) (HCC)   ? Chronic low back pain   ? Chronic systolic heart failure ([place])   ? a. TTE 2011 with EF 40-45% per notes  ? Diabetes mellitus without complication ([place])   ? Erectile dysfunction   ? HLD (hyperlipidemia)   ? Hypertension   ? Hypogonadism in male   ? Permanent atrial fibrillation ([place])   ? a. s/p DCCV 2011; b. redeveloped Afib in 2016; c.  CHADS2VASc at least 5 (CHF, HTN, age x 1, DM, vascular disease); d. on Xarelto  ? Torn meniscus   ? right knee  ? ?Past Surgical History:  ?Procedure Laterality Date  ? COLONOSCOPY WITH PROPOFOL N/A 05/28/2016  ? Procedure: COLONOSCOPY WITH PROPOFOL;  Surgeon: Lucilla Lame, MD;  Location: [facility];  Service: Endoscopy;  Laterality: N/A;  diabetic - oral meds  ? DVT, leg Left   ? INSERTION OF VENA CAVA FILTER  2014  ? KNEE ARTHROSCOPY WITH MEDIAL MENISECTOMY Right 05/31/2019  ? Procedure: KNEE ARTHROSCOPY WITH partial  MEDIAL MENISECTOMY;  Surgeon: Leim Fabry, MD;  Location: [facility];  Service: Orthopedics;  Laterality: Right;  ? LOWER EXTREMITY VENOGRAPHY Left 08/21/2021  ? Procedure: LOWER EXTREMITY VENOGRAPHY;  Surgeon: Katha Cabal, MD;  Location: [facility];  Service: Cardiovascular;  Laterality: Left;  Possible IVC Filter Insertion  ? PERIPHERAL VASCULAR THROMBECTOMY Left 12/01/2017  ? Procedure: PERIPHERAL VASCULAR THROMBECTOMY;  Surgeon: Katha Cabal, MD;  Location: [facility];  Service: Cardiovascular;  Laterality: Left;  ? ?Family History  ?Problem Relation Age of Onset  ? Lymphoma Mother   ? Heart failure Father   ? Diabetes Father   ? Atrial fibrillation Brother   ? Diabetes Mellitus II Other   ? Heart attack Neg Hx   ? Hypertension Neg Hx   ? Cancer Neg Hx   ? COPD Neg Hx   ? Stroke Neg Hx   ? Prostate cancer Neg Hx   ? Kidney cancer Neg Hx   ? Bladder Cancer Neg Hx   ? ?Social History  ? ?Socioeconomic History  ? Marital status: Single  ?  Spouse name: Not on file  ? Number of children: Not on file  ? Years of education: Not on file  ? Highest education level: Some college, no degree  ?Occupational History  ? Occupation: [employer]   ?Tobacco Use  ? Smoking status: Never  ? Smokeless tobacco: Never  ?Vaping Use  ? Vaping Use: Never used  ?Substance and Sexual Activity  ? Alcohol use: No  ?  Alcohol/week: 0.0 standard drinks  ? Drug use: No  ? Sexual  activity: Yes  ?Other Topics Concern  ? Not on file  ?Social History Narrative  ? Former Engineer, structural   ? Independent at baseline. [employer]  ? Works at [employer]   ? ?Social Determinants of Health  ? ?Financial Resource Strain: Low Risk   ? Difficulty of Paying Living Expenses: Not hard at all  ?Food Insecurity: No Food Insecurity  ? Worried About Charity fundraiser in the Last Year: Never true  ? Ran Out of Food in the Last Year: Never true  ?Transportation Needs: No Transportation Needs  ? Lack of Transportation (Medical): No  ? Lack of Transportation (Non-Medical): No  ?Physical Activity: Sufficiently Active  ? Days of Exercise per  Week: 5 days  ? Minutes of Exercise per Session: 50 min  ?Stress: No Stress Concern Present  ? Feeling of Stress : Not at all  ?Social Connections: Unknown  ? Frequency of Communication with Friends and Family: More than three times a week  ? Frequency of Social Gatherings with Friends and Family: More than three times a week  ? Attends Religious Services: More than 4 times per year  ? Active Member of Clubs or Organizations: Yes  ? Attends Archivist Meetings: More than 4 times per year  ? Marital Status: Not on file  ? ? ?Tobacco Counseling ?Counseling given: Not Answered ? ? ?Clinical Intake: ? ?Pre-visit preparation completed: Yes ? ?Pain : No/denies pain ? ?  ? ?Nutritional Risks: None ?Diabetes: Yes ?CBG done?: No ?Did pt. bring in CBG monitor from home?: No ? ?How often do you need to have someone help you when you read instructions, pamphlets, or other written materials from your doctor or pharmacy?: 1 - Never ? ?Diabetic?  Yes ? ?Nutrition Risk Assessment: ? ?Has the patient had any N/V/D within the last 2 months?  No  ?Does the patient have any non-healing wounds?  No  ?Has the patient had any unintentional weight loss or weight gain?  No  ? ?Diabetes: ? ?Is the patient diabetic?  Yes  ?If diabetic, was a CBG obtained today?  No  ?Did the patient  bring in their glucometer from home?  Yes  ?How often do you monitor your CBG's? 1 x a day.  ? ?Financial Strains and Diabetes Management: ? ?Are you having any financial strains with the device, your suppli

## 2022-03-16 ENCOUNTER — Ambulatory Visit: Payer: Medicare HMO | Admitting: Family Medicine

## 2022-03-29 ENCOUNTER — Ambulatory Visit (INDEPENDENT_AMBULATORY_CARE_PROVIDER_SITE_OTHER): Payer: Medicare HMO | Admitting: Family Medicine

## 2022-03-29 ENCOUNTER — Encounter: Payer: Self-pay | Admitting: Family Medicine

## 2022-03-29 VITALS — BP 110/71 | HR 65 | Temp 97.9°F | Wt 249.4 lb

## 2022-03-29 DIAGNOSIS — I482 Chronic atrial fibrillation, unspecified: Secondary | ICD-10-CM | POA: Diagnosis not present

## 2022-03-29 DIAGNOSIS — E782 Mixed hyperlipidemia: Secondary | ICD-10-CM

## 2022-03-29 DIAGNOSIS — I420 Dilated cardiomyopathy: Secondary | ICD-10-CM | POA: Diagnosis not present

## 2022-03-29 DIAGNOSIS — E1159 Type 2 diabetes mellitus with other circulatory complications: Secondary | ICD-10-CM

## 2022-03-29 DIAGNOSIS — I509 Heart failure, unspecified: Secondary | ICD-10-CM | POA: Diagnosis not present

## 2022-03-29 LAB — BAYER DCA HB A1C WAIVED: HB A1C (BAYER DCA - WAIVED): 6.7 % — ABNORMAL HIGH (ref 4.8–5.6)

## 2022-03-29 MED ORDER — DOXAZOSIN MESYLATE 2 MG PO TABS: 2.0000 mg | ORAL_TABLET | Freq: Two times a day (BID) | ORAL | 1 refills | Status: DC

## 2022-03-29 MED ORDER — BENAZEPRIL HCL 20 MG PO TABS: 20.0000 mg | ORAL_TABLET | Freq: Every day | ORAL | 1 refills | Status: DC

## 2022-03-29 MED ORDER — FUROSEMIDE 20 MG PO TABS: 20.0000 mg | ORAL_TABLET | Freq: Every day | ORAL | 1 refills | Status: DC

## 2022-03-29 MED ORDER — ALBUTEROL SULFATE HFA 108 (90 BASE) MCG/ACT IN AERS
2.0000 | INHALATION_SPRAY | RESPIRATORY_TRACT | 3 refills | Status: DC | PRN
Start: 2022-03-29 — End: 2022-08-16

## 2022-03-29 MED ORDER — METFORMIN HCL ER 500 MG PO TB24: 1000.0000 mg | ORAL_TABLET | Freq: Two times a day (BID) | ORAL | 1 refills | Status: DC | PRN

## 2022-03-29 MED ORDER — ATORVASTATIN CALCIUM 20 MG PO TABS: 10.0000 mg | ORAL_TABLET | Freq: Every day | ORAL | 1 refills | Status: DC

## 2022-03-29 MED ORDER — RIVAROXABAN 20 MG PO TABS: 20.0000 mg | ORAL_TABLET | Freq: Every day | ORAL | 1 refills | Status: DC

## 2022-03-29 MED ORDER — METOPROLOL TARTRATE 100 MG PO TABS: 100.0000 mg | ORAL_TABLET | Freq: Two times a day (BID) | ORAL | 1 refills | Status: DC

## 2022-03-29 NOTE — Assessment & Plan Note (Signed)
Due for follow up with Dr. Gollan- encouraged him to call to schedule appt. Refill given today. 

## 2022-03-29 NOTE — Assessment & Plan Note (Signed)
Euvolemic today. Due for follow up with Dr. Rockey Situ- encouraged him to call to schedule appt. Refill given today.

## 2022-03-29 NOTE — Assessment & Plan Note (Signed)
Due for follow up with Dr. Mariah Milling- encouraged him to call to schedule appt. Refill given today.

## 2022-03-29 NOTE — Patient Instructions (Signed)
Call to schedule your appointment with Dr. Mariah Milling

## 2022-03-29 NOTE — Progress Notes (Signed)
BP 110/71   Pulse 65   Temp 97.9 F (36.6 C)   Wt 249 lb 6.4 oz (113.1 kg)   SpO2 99%   BMI 35.58 kg/m    Subjective:    Patient ID: Jeff Lips., male    DOB: [birth year], 71 y.o.   MRN: [id number]  HPI: Jeff Slaby. is a 71 y.o. male  Chief Complaint  Patient presents with   Diabetes   DIABETES Hypoglycemic episodes:no Polydipsia/polyuria: yes Visual disturbance: no Chest pain: no Paresthesias: no Glucose Monitoring: yes  Accucheck frequency: Not Checking Taking Insulin?: no Blood Pressure Monitoring: not checking Retinal Examination: Up to Date Foot Exam: Up to Date Diabetic Education: Completed Pneumovax: Up to Date Influenza: Up to Date Aspirin: yes  Relevant past medical, surgical, family and social history reviewed and updated as indicated. Interim medical history since our last visit reviewed. Allergies and medications reviewed and updated.  Review of Systems  Respiratory: Negative.    Cardiovascular: Negative.   Gastrointestinal: Negative.   Musculoskeletal: Negative.   Psychiatric/Behavioral: Negative.     Per HPI unless specifically indicated above     Objective:    BP 110/71   Pulse 65   Temp 97.9 F (36.6 C)   Wt 249 lb 6.4 oz (113.1 kg)   SpO2 99%   BMI 35.58 kg/m   Wt Readings from Last 3 Encounters:  03/29/22 249 lb 6.4 oz (113.1 kg)  12/28/21 243 lb 6.4 oz (110.4 kg)  12/14/21 249 lb 12.8 oz (113.3 kg)    Physical Exam Vitals and nursing note reviewed.  Constitutional:      General: He is not in acute distress.    Appearance: Normal appearance. He is obese. He is not ill-appearing, toxic-appearing or diaphoretic.  HENT:     Head: Normocephalic and atraumatic.     Right Ear: External ear normal.     Left Ear: External ear normal.     Nose: Nose normal.     Mouth/Throat:     Mouth: Mucous membranes are moist.     Pharynx: Oropharynx is clear.  Eyes:     General: No scleral icterus.       Right eye: No  discharge.        Left eye: No discharge.     Extraocular Movements: Extraocular movements intact.     Conjunctiva/sclera: Conjunctivae normal.     Pupils: Pupils are equal, round, and reactive to light.  Cardiovascular:     Rate and Rhythm: Normal rate and regular rhythm.     Pulses: Normal pulses.     Heart sounds: Normal heart sounds. No murmur heard.   No friction rub. No gallop.  Pulmonary:     Effort: Pulmonary effort is normal. No respiratory distress.     Breath sounds: Normal breath sounds. No stridor. No wheezing, rhonchi or rales.  Chest:     Chest wall: No tenderness.  Musculoskeletal:        General: Normal range of motion.     Cervical back: Normal range of motion and neck supple.  Skin:    General: Skin is warm and dry.     Capillary Refill: Capillary refill takes less than 2 seconds.     Coloration: Skin is not jaundiced or pale.     Findings: No bruising, erythema, lesion or rash.  Neurological:     General: No focal deficit present.     Mental Status: He is alert and oriented to person,  place, and time. Mental status is at baseline.  Psychiatric:        Mood and Affect: Mood normal.        Behavior: Behavior normal.        Thought Content: Thought content normal.        Judgment: Judgment normal.    Results for orders placed or performed in visit on 12/28/21  Novel Coronavirus, NAA ([facility])   Specimen: Nasopharyngeal(NP) swabs in vial transport medium  Result Value Ref Range   SARS-CoV-2, NAA Not Detected Not Detected  Influenza A & B (STAT)  Result Value Ref Range   Influenza A Negative Negative   Influenza B Negative Negative      Assessment & Plan:   Problem List Items Addressed This Visit       Cardiovascular and Mediastinum   Congestive dilated cardiomyopathy ([place])    Due for follow up with Dr. Rockey Situ- encouraged him to call to schedule appt. Refill given today.       Relevant Medications   rivaroxaban (XARELTO) 20 MG TABS tablet    metoprolol tartrate (LOPRESSOR) 100 MG tablet   furosemide (LASIX) 20 MG tablet   doxazosin (CARDURA) 2 MG tablet   benazepril (LOTENSIN) 20 MG tablet   atorvastatin (LIPITOR) 20 MG tablet   Atrial fibrillation, chronic (HCC)    Due for follow up with Dr. Rockey Situ- encouraged him to call to schedule appt. Refill given today.       Relevant Medications   rivaroxaban (XARELTO) 20 MG TABS tablet   metoprolol tartrate (LOPRESSOR) 100 MG tablet   furosemide (LASIX) 20 MG tablet   doxazosin (CARDURA) 2 MG tablet   benazepril (LOTENSIN) 20 MG tablet   atorvastatin (LIPITOR) 20 MG tablet   Congestive heart failure (HCC)    Euvolemic today. Due for follow up with Dr. Rockey Situ- encouraged him to call to schedule appt. Refill given today.       Relevant Medications   rivaroxaban (XARELTO) 20 MG TABS tablet   metoprolol tartrate (LOPRESSOR) 100 MG tablet   furosemide (LASIX) 20 MG tablet   doxazosin (CARDURA) 2 MG tablet   benazepril (LOTENSIN) 20 MG tablet   atorvastatin (LIPITOR) 20 MG tablet     Endocrine   Diabetes mellitus ([place]) - Primary    Doing great with A1c of 6.7. Continue diet and exercise and metformin. Continue to monitor. Recheck 3 months.        Relevant Medications   metFORMIN (GLUCOPHAGE-XR) 500 MG 24 hr tablet   benazepril (LOTENSIN) 20 MG tablet   atorvastatin (LIPITOR) 20 MG tablet   Other Relevant Orders   Bayer DCA Hb A1c Waived     Other   Mixed hyperlipidemia   Relevant Medications   rivaroxaban (XARELTO) 20 MG TABS tablet   metoprolol tartrate (LOPRESSOR) 100 MG tablet   furosemide (LASIX) 20 MG tablet   doxazosin (CARDURA) 2 MG tablet   benazepril (LOTENSIN) 20 MG tablet   atorvastatin (LIPITOR) 20 MG tablet     Follow up plan: Return in about 3 months (around 06/29/2022).

## 2022-03-29 NOTE — Assessment & Plan Note (Signed)
Doing great with A1c of 6.7. Continue diet and exercise and metformin. Continue to monitor. Recheck 3 months.

## 2022-06-06 NOTE — Progress Notes (Unsigned)
Evaluation Performed:  Follow-up visit  Date:  06/07/2022   ID:  Jeff Mealing., DOB 1951/08/07, MRN [id number]  Patient Location:  [address] [place] [address]   Provider location:   [facility], [place] office  PCP:  Dorcas Carrow, DO  Cardiologist:  Fonnie Mu  Chief Complaint  Patient presents with   12 month follow up     "Doing well." Medications reviewed by the patient verbally.     History of Present Illness:    Jeff Baggerly. is a 71 y.o. male  past medical history of DM2  hemoglobin A1c 7.7 DVT LLE 2010, with filter (removed) hyperlipidemia  April 2011  atrial fibrillation with RVR,  emergency room on April 15 2010 for rapid atrial fibrillation,  s/p cardioversion   lower extremity edema On calcium channel blockers,    Echo in 02/2010 shows EF 40-45%,  possibly tachycardia induced  Converted back to atrial fibrillation in 2016, at that time did not want cardioversion (CHF, HTN, age x 1, DM, vascular disease): IVC stenosis, followed [facility] 2  he presents for routine followup for chronic atrial fibrillation, and extensive DVT  LOV April 2022  In the hospital October 2022 went to his dermatologist office October 2022 and they felt his left leg was much more swollen than the right and told him to be evaluated in the ER  There was concern for recurrent DVT.  Vascular surgery was consulted.  Hematology was also consulted. Venous ultrasound was obtained revealing 1. Stable chronic nonocclusive post thrombotic changes in the popliteal and femoral veins. 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 He was started on prednisone which elevated his blood sugar.  He was not admitted. He went to [facility] ER on Tuesday 10/6 at 6 am and sat in the ER in a cubicle until 10 pm that night, even being COVID +.  stayed there 6 days and was started on remdesivir and antibodies trial diagnosed with pneumonia as well.  discharged/ quarantined for 20 days.   Chest pain in ER 12/2018 Hospital records reviewed with the patient in detail SOB, and coughing Took Mucinex and Zyrtec.  He thinks maybe it is a reaction to both of the medicines  chest tightness and warmth associated with some clammy feeling. Had pressure 185/93 Pulse 127  08/2018  Sepsis secondary to cellulitis of the left lower extremity and streptococcus bacteremia: Hospital records reviewed with the patient in detail   History of DVT left lower extremity  thrombus was occlusive left SFA down to popliteal   IVC filter in place from 2010  As outpatient seen by Dr. Lorretta Harp, "too much clot" Recommended lymphedema compression pumps   Referred to [facility] for thrombectomy Reports that he had thrombectomy procedure , took hours 5 pm to 10 pm No anesthesia, very painful   He has had follow-up lower extremity CT [facility] Confirming residual B/l extensive clot, iliacs extending downward   Past Medical History:  Diagnosis Date   Chronic deep vein thrombosis (DVT) (HCC)    Chronic low back pain    Chronic systolic heart failure (HCC)    a. TTE 2011 with EF 40-45%  per notes   Diabetes mellitus without complication [facility])    Erectile dysfunction    HLD (hyperlipidemia)    Hypertension    Hypogonadism in male    Permanent atrial fibrillation (HCC)    a. s/p DCCV 2011; b. redeveloped Afib in 2016; c. CHADS2VASc at least 5 (CHF, HTN, age x 1, DM, vascular disease); d. on Xarelto   Torn meniscus    right knee   Past Surgical History:  Procedure Laterality Date   COLONOSCOPY WITH PROPOFOL N/A 05/28/2016   Procedure: COLONOSCOPY WITH PROPOFOL;  Surgeon: Midge Minium, MD;  Location: [facility];  Service: Endoscopy;  Laterality: N/A;  diabetic - oral meds   DVT, leg Left    INSERTION OF VENA CAVA FILTER  2014   KNEE ARTHROSCOPY WITH MEDIAL MENISECTOMY Right 05/31/2019   Procedure: KNEE ARTHROSCOPY WITH partial  MEDIAL MENISECTOMY;  Surgeon: Signa Kell, MD;  Location: [facility];  Service: Orthopedics;  Laterality: Right;   LOWER EXTREMITY VENOGRAPHY Left 08/21/2021   Procedure: LOWER EXTREMITY VENOGRAPHY;  Surgeon: Renford Dills, MD;  Location: [facility] INVASIVE CV LAB;  Service: Cardiovascular;  Laterality: Left;  Possible IVC Filter Insertion   PERIPHERAL VASCULAR THROMBECTOMY Left 12/01/2017   Procedure: PERIPHERAL VASCULAR THROMBECTOMY;  Surgeon: Renford Dills, MD;  Location: [facility] INVASIVE CV LAB;  Service: Cardiovascular;  Laterality: Left;     Current Meds  Medication Sig   albuterol (VENTOLIN HFA) 108 (90 Base) MCG/ACT inhaler Inhale 2 puffs into the lungs every 4 (four) hours as needed for wheezing or shortness of breath.   amLODipine (NORVASC) 5 MG tablet Take 5 mg by mouth daily.   Aspirin 325 MG CAPS Take by mouth.   atorvastatin (LIPITOR) 20 MG tablet Take 0.5 tablets (10 mg total) by mouth daily.   benazepril (LOTENSIN) 20 MG tablet Take 1 tablet (20 mg total) by mouth daily.   doxazosin (CARDURA) 2 MG tablet Take 1 tablet (2 mg total) by mouth 2 (two) times daily.   fluticasone (FLONASE) 50 MCG/ACT nasal spray Place 2  sprays into both nostrils daily as needed for allergies or rhinitis.   furosemide (LASIX) 20 MG tablet Take 1 tablet (20 mg total) by mouth daily.   metFORMIN (GLUCOPHAGE-XR) 500 MG 24 hr tablet Take 2 tablets (1,000 mg total) by mouth 2 (two) times daily as needed.   metoprolol tartrate (LOPRESSOR) 100 MG tablet Take 1 tablet (100 mg total) by mouth 2 (two) times daily.   rivaroxaban (XARELTO) 20 MG TABS tablet Take 1 tablet (20 mg total) by mouth daily.   tiZANidine (ZANAFLEX) 4 MG tablet Take 1 tablet (4 mg total) by mouth every 6 (six) hours as needed for muscle spasms.     Allergies:   Patient has no known allergies.   Social History   Tobacco Use   Smoking status: Never   Smokeless tobacco: Never  Vaping Use   Vaping  Use: Never used  Substance Use Topics   Alcohol use: No    Alcohol/week: 0.0 standard drinks of alcohol   Drug use: No     Family Hx: The patient's family history includes Atrial fibrillation in his brother; Diabetes in his father; Diabetes Mellitus II in an other family member; Heart failure in his father; Lymphoma in his mother. There is no history of Heart attack, Hypertension, Cancer, COPD, Stroke, Prostate cancer, Kidney cancer, or Bladder Cancer.  ROS:   Please see the history of present illness.    Review of Systems  Constitutional: Negative.   Respiratory: Negative.    Cardiovascular: Negative.   Gastrointestinal: Negative.   Musculoskeletal: Negative.   Neurological: Negative.   Psychiatric/Behavioral: Negative.    All other systems reviewed and are negative.    Labs/Other Tests and Data Reviewed:    Recent Labs: 12/14/2021: ALT 15; BUN 39; Creatinine, Ser 1.34; Hemoglobin 16.2; Platelets 197; Potassium 5.3; Sodium 138   Recent Lipid Panel Lab Results  Component Value Date/Time   CHOL 134 12/14/2021 02:23 PM   CHOL 130 09/07/2018 08:22 AM   TRIG 63 12/14/2021 02:23 PM   TRIG 95 09/07/2018 08:22 AM   TRIG 111 02/17/2010 12:00 AM   HDL 39  (L) 12/14/2021 02:23 PM   CHOLHDL 4.0 10/06/2019 12:53 PM   LDLCALC 82 12/14/2021 02:23 PM    Wt Readings from Last 3 Encounters:  06/07/22 263 lb 8 oz (119.5 kg)  03/29/22 249 lb 6.4 oz (113.1 kg)  12/28/21 243 lb 6.4 oz (110.4 kg)     Exam:    Vital Signs: Vital signs may also be detailed in the HPI BP 110/70 (BP Location: Left Arm, Patient Position: Sitting, Cuff Size: Normal)   Pulse 64   Ht 5\' 10"  (1.778 m)   Wt 263 lb 8 oz (119.5 kg)   SpO2 98%   BMI 37.81 kg/m   Constitutional:  oriented to person, place, and time. No distress.  HENT:  Head: Grossly normal Eyes:  no discharge. No scleral icterus.  Neck: No JVD, no carotid bruits  Cardiovascular: Regular rate and rhythm, no murmurs appreciated Pulmonary/Chest: Clear to auscultation bilaterally, no wheezes or rails Abdominal: Soft.  no distension.  no tenderness.  Musculoskeletal: Normal range of motion Neurological:  normal muscle tone. Coordination normal. No atrophy Skin: Skin warm and dry Psychiatric: normal affect, pleasant  ASSESSMENT & PLAN:    Atrial fibrillation, permanent Continue Xarelto 20 daily Rate well controlled Recommend he talk with vascular at [facility] whether he needs to stay on aspirin  Congestive dilated cardiomyopathy (HCC), diastolic Euvolemic On Lasix 20 daily, renal function up slightly in February Not on Farxiga, presumably secondary to price  Type 2 diabetes mellitus with other circulatory complication, without long-term current use of insulin (HCC) Changed diet, A1c well controlled  HYPERTENSION, BENIGN Blood pressure is well controlled on today's visit. No changes made to the medications. Weight trending downward  Mixed hyperlipidemia Cholesterol is at goal on the current lipid regimen. No changes to the medications were made.  Acute renal failure, unspecified acute renal failure type (HCC) Long history of diabetes,  Avoid NSAIDs Diabetes numbers much better  History of  DVT continue Xarelto, reports that he remains on full dose aspirin per vascular at [facility] Recommend he talk with his [facility] vascular team to determine if this is needed at this time Recommend he consider over-the-counter PPI if he needs to remain on aspirin   Total encounter time more than  30 minutes  Greater than 50% was spent in counseling and coordination of care with the patient    Signed, Julien Nordmann, MD  06/07/2022 8:57 AM    [facility] [address], [place], [place] [address]

## 2022-06-07 ENCOUNTER — Ambulatory Visit: Payer: Medicare HMO | Admitting: Cardiovascular Disease

## 2022-06-07 ENCOUNTER — Encounter: Payer: Self-pay | Admitting: Cardiovascular Disease

## 2022-06-07 VITALS — BP 110/70 | HR 64 | Ht 70.0 in | Wt 263.5 lb

## 2022-06-07 DIAGNOSIS — I82412 Acute embolism and thrombosis of left femoral vein: Secondary | ICD-10-CM

## 2022-06-07 DIAGNOSIS — I420 Dilated cardiomyopathy: Secondary | ICD-10-CM | POA: Diagnosis not present

## 2022-06-07 DIAGNOSIS — E782 Mixed hyperlipidemia: Secondary | ICD-10-CM | POA: Diagnosis not present

## 2022-06-07 DIAGNOSIS — I82220 Acute embolism and thrombosis of inferior vena cava: Secondary | ICD-10-CM | POA: Diagnosis not present

## 2022-06-07 DIAGNOSIS — I871 Compression of vein: Secondary | ICD-10-CM

## 2022-06-07 DIAGNOSIS — I482 Chronic atrial fibrillation, unspecified: Secondary | ICD-10-CM | POA: Diagnosis not present

## 2022-06-07 NOTE — Patient Instructions (Addendum)
Medication Instructions:  Consider starting omeprazole OTC for stomach protection  If you need a refill on your cardiac medications before your next appointment, please call your pharmacy.   Lab work: No new labs needed  Testing/Procedures: No new testing needed  Follow-Up: At [facility], you and your health needs are our priority.  As part of our continuing mission to provide you with exceptional heart care, we have created designated Provider Care Teams.  These Care Teams include your primary Cardiologist (physician) and Advanced Practice Providers (APPs -  Physician Assistants and Nurse Practitioners) who all work together to provide you with the care you need, when you need it.  You will need a follow up appointment in 12 months  Providers on your designated Care Team:   Nicolasa Ducking, NP Eula Listen, PA-C Cadence Fransico Michael, [place]  COVID-19 Vaccine Information can be found at: [email or web address] For questions related to vaccine distribution or appointments, please email [email or web address] or call [phone].

## 2022-06-18 ENCOUNTER — Other Ambulatory Visit: Payer: Self-pay

## 2022-06-18 DIAGNOSIS — I482 Chronic atrial fibrillation, unspecified: Secondary | ICD-10-CM

## 2022-06-18 MED ORDER — RIVAROXABAN 20 MG PO TABS: 20.0000 mg | ORAL_TABLET | Freq: Every day | ORAL | 1 refills | Status: DC

## 2022-06-18 NOTE — Telephone Encounter (Signed)
Prescription refill request for Xarelto received.  Indication: Afib  Last office visit: 06/07/22 Mariah Milling) Weight: 119.5kg Age: 71 Scr: 1.34 (12/14/21)  CrCl: 85.76ml/min  Appropriate dose and refill sent to requested pharmacy.

## 2022-06-29 ENCOUNTER — Ambulatory Visit: Payer: Medicare HMO | Admitting: Family Medicine

## 2022-07-11 ENCOUNTER — Other Ambulatory Visit: Payer: Self-pay | Admitting: Family Medicine

## 2022-07-13 NOTE — Telephone Encounter (Signed)
It doesn't look like we write that or that he was taking it last time he was here.

## 2022-07-13 NOTE — Telephone Encounter (Signed)
Requested medication (s) are due for refill today: Amount not specified  Requested medication (s) are on the active medication list: Yes    Last refill: 06/07/22 Amount not specified  Future visit scheduled Yes 08/02/22  Notes to clinic:Historical Provider Please review.  Requested Prescriptions  Pending Prescriptions Disp Refills   amLODipine (NORVASC) 5 MG tablet [Pharmacy Med Name: AMLODIPINE BESYLATE 5 MG TAB] 90 tablet 3    Sig: TAKE 1 TABLET BY MOUTH EVERY DAY     Cardiovascular: Calcium Channel Blockers 2 Passed - 07/13/2022  4:29 PM      Passed - Last BP in normal range    BP Readings from Last 1 Encounters:  06/07/22 110/70         Passed - Last Heart Rate in normal range    Pulse Readings from Last 1 Encounters:  06/07/22 64         Passed - Valid encounter within last 6 months    Recent Outpatient Visits           3 months ago Type 2 diabetes mellitus with other circulatory complication, without long-term current use of insulin (HCC)   [facility], Megan P, DO   6 months ago Upper respiratory tract infection, unspecified type   [facility], Megan P, DO   7 months ago Mixed hyperlipidemia   [facility], Megan P, DO   10 months ago Cellulitis of left thigh   [facility] [place], Megan P, DO   11 months ago Hypotension, unspecified hypotension type   [facility] [place], Oralia Rud, DO       Future Appointments             In 2 weeks Laural Benes, Oralia Rud, DO [employer]

## 2022-07-19 ENCOUNTER — Ambulatory Visit: Payer: Self-pay

## 2022-07-19 NOTE — Telephone Encounter (Signed)
Agent transferred triage call, connected to line but pt. Not answering.    Chief Complaint: Right side back pain Symptoms: Pain Frequency: 4 months Pertinent Negatives: Patient denies weakness Disposition: [] ED /[] Urgent Care (no appt availability in office) / [x] Appointment(In office/virtual)/ []  [facility]/ [] Home Care/ [] Refused Recommended Disposition /[] [place] [facility]/ []  Follow-up with PCP Additional Notes:   Reason for Disposition  [1] MODERATE back pain (e.g., interferes with normal activities) AND [2] present > 3 days  Answer Assessment - Initial Assessment Questions 1. ONSET: "When did the pain begin?"      4 months ago 2. LOCATION: "Where does it hurt?" (upper, mid or lower back)     Right side 3. SEVERITY: "How bad is the pain?"  (e.g., Scale 1-10; mild, moderate, or severe)   - MILD (1-3): Doesn't interfere with normal activities.    - MODERATE (4-7): Interferes with normal activities or awakens from sleep.    - SEVERE (8-10): Excruciating pain, unable to do any normal activities.      Now - 8 4. PATTERN: "Is the pain constant?" (e.g., yes, no; constant, intermittent)      Comes and goes  5. RADIATION: "Does the pain shoot into your legs or somewhere else?"     Right side 6. CAUSE:  "What do you think is causing the back pain?"      Muscle 7. BACK OVERUSE:  "Any recent lifting of heavy objects, strenuous work or exercise?"     No 8. MEDICINES: "What have you taken so far for the pain?" (e.g., nothing, acetaminophen, NSAIDS)     Muscle relaxer 9. NEUROLOGIC SYMPTOMS: "Do you have any weakness, numbness, or problems with bowel/bladder control?"     No 10. OTHER SYMPTOMS: "Do you have any other symptoms?" (e.g., fever, abdomen pain, burning with urination, blood in urine)       No 11. PREGNANCY: "Is there any chance you are pregnant?" "When was your last menstrual period?"       N/A  Protocols used: Back Pain-A-AH

## 2022-07-20 ENCOUNTER — Encounter: Payer: Self-pay | Admitting: Nurse Practitioner

## 2022-07-20 ENCOUNTER — Ambulatory Visit (INDEPENDENT_AMBULATORY_CARE_PROVIDER_SITE_OTHER): Payer: Medicare HMO | Admitting: Nurse Practitioner

## 2022-07-20 VITALS — BP 97/61 | HR 57 | Temp 97.7°F | Wt 258.6 lb

## 2022-07-20 DIAGNOSIS — G8929 Other chronic pain: Secondary | ICD-10-CM | POA: Diagnosis not present

## 2022-07-20 DIAGNOSIS — I129 Hypertensive chronic kidney disease with stage 1 through stage 4 chronic kidney disease, or unspecified chronic kidney disease: Secondary | ICD-10-CM | POA: Diagnosis not present

## 2022-07-20 DIAGNOSIS — M5441 Lumbago with sciatica, right side: Secondary | ICD-10-CM

## 2022-07-20 MED ORDER — TRIAMCINOLONE ACETONIDE 40 MG/ML IJ SUSP
40.0000 mg | Freq: Once | INTRAMUSCULAR | Status: AC
  Administered 2022-07-20: 40 mg via INTRAMUSCULAR

## 2022-07-20 MED ORDER — GABAPENTIN 100 MG PO CAPS: 100.0000 mg | ORAL_CAPSULE | Freq: Three times a day (TID) | ORAL | 0 refills | Status: DC

## 2022-07-20 NOTE — Progress Notes (Signed)
BP 97/61   Pulse (!) 57   Temp 97.7 F (36.5 C) (Oral)   Wt 258 lb 9.6 oz (117.3 kg)   SpO2 97%   BMI 37.11 kg/m    Subjective:    Patient ID: [name]., male    DOB: 1951-05-30, 71 y.o.   MRN: [id number]  HPI: Javonte Elenes. is a 71 y.o. male  Chief Complaint  Patient presents with   Back Pain    ?flare up. Pt c/o R low back going down buttock. Denies urinary symptoms.    Hypertension    Patient reports amlodipine has ran out x a few weeks, has requested medications from cardiologist and they have not filled it    BACK PAIN Right lower back Duration: days Mechanism of injury:  fell in the dog kennel and exacerbated pain Location: Right and low back Onset: sudden Severity: 5/10 Quality: sharp Frequency: intermittent Radiation: R leg above the knee Aggravating factors: walking and prolonged sitting Alleviating factors: muscle relaxer Status: worse Treatments attempted:  tylenol, muscle relaxer and heat  Relief with NSAIDs?: No NSAIDs Taken Nighttime pain:  no Paresthesias / decreased sensation:  yes Bowel / bladder incontinence:  no Fevers:  no Dysuria / urinary frequency:  no   HYPERTENSION without Chronic Kidney Disease Hypertension status: uncontrolled  Satisfied with current treatment? yes Duration of hypertension: years BP monitoring frequency:  daily BP range: 60/90-40/60 BP medication side effects:  no Medication compliance: excellent compliance Previous BP meds:amlodipine Aspirin: no Recurrent headaches: no Visual changes: no Palpitations: no Dyspnea: no Chest pain: no Lower extremity edema: no Dizzy/lightheaded: yes    Relevant past medical, surgical, family and social history reviewed and updated as indicated. Interim medical history since our last visit reviewed. Allergies and medications reviewed and updated.  Review of Systems  Eyes:  Negative for visual disturbance.  Respiratory:  Negative for shortness of breath.    Cardiovascular:  Negative for chest pain and leg swelling.  Musculoskeletal:  Positive for back pain.  Neurological:  Positive for light-headedness. Negative for headaches.    Per HPI unless specifically indicated above     Objective:    BP 97/61   Pulse (!) 57   Temp 97.7 F (36.5 C) (Oral)   Wt 258 lb 9.6 oz (117.3 kg)   SpO2 97%   BMI 37.11 kg/m   Wt Readings from Last 3 Encounters:  07/20/22 258 lb 9.6 oz (117.3 kg)  06/07/22 263 lb 8 oz (119.5 kg)  03/29/22 249 lb 6.4 oz (113.1 kg)    Physical Exam Vitals and nursing note reviewed.  Constitutional:      General: He is not in acute distress.    Appearance: Normal appearance. He is obese. He is not ill-appearing, toxic-appearing or diaphoretic.  HENT:     Head: Normocephalic.     Right Ear: External ear normal.     Left Ear: External ear normal.     Nose: Nose normal. No congestion or rhinorrhea.     Mouth/Throat:     Mouth: Mucous membranes are moist.  Eyes:     General:        Right eye: No discharge.        Left eye: No discharge.     Extraocular Movements: Extraocular movements intact.     Conjunctiva/sclera: Conjunctivae normal.     Pupils: Pupils are equal, round, and reactive to light.  Cardiovascular:     Rate and Rhythm: Normal rate and  regular rhythm.     Heart sounds: No murmur heard. Pulmonary:     Effort: Pulmonary effort is normal. No respiratory distress.     Breath sounds: Normal breath sounds. No wheezing, rhonchi or rales.  Abdominal:     General: Abdomen is flat. Bowel sounds are normal.  Musculoskeletal:     Cervical back: Normal range of motion and neck supple.  Skin:    General: Skin is warm and dry.     Capillary Refill: Capillary refill takes less than 2 seconds.  Neurological:     General: No focal deficit present.     Mental Status: He is alert and oriented to person, place, and time.  Psychiatric:        Mood and Affect: Mood normal.        Behavior: Behavior normal.         Thought Content: Thought content normal.        Judgment: Judgment normal.     Results for orders placed or performed in visit on 03/29/22  Bayer DCA Hb A1c Waived  Result Value Ref Range   HB A1C (BAYER DCA - WAIVED) 6.7 (H) 4.8 - 5.6 %      Assessment & Plan:   Problem List Items Addressed This Visit       Genitourinary   Hypertensive renal disease    Chronic.  Has been running low.  90/60 and lower.  Has dizziness and lightheadedness, especially upon standing.  Had lengthy discussion with patient about hydration.  Increase water intake to at least 60 ounces daily. Decrease caffeine intake.  Follow up in 2 weeks with PCP.  Do not restart Amlodipine.      Other Visit Diagnoses     Chronic right-sided low back pain with right-sided sciatica    -  Primary   Exacerbated. Will give triamcinalone in office. Will treat with Gabapentin. Continue with Zanaflex PRN for pain.    Relevant Medications   triamcinolone acetonide (KENALOG-40) injection 40 mg (Completed)   gabapentin (NEURONTIN) 100 MG capsule        Follow up plan: Return in about 1 month (around 08/19/2022) for Routine follow up with Dr. Shela Commons.  A total of 30 minutes were spent on this encounter today.  When total time is documented, this includes both the face-to-face and non-face-to-face time personally spent before, during and after the visit on the date of the encounter, pain, symptoms, plan of care and education.

## 2022-07-20 NOTE — Assessment & Plan Note (Signed)
Chronic.  Has been running low.  90/60 and lower.  Has dizziness and lightheadedness, especially upon standing.  Had lengthy discussion with patient about hydration.  Increase water intake to at least 60 ounces daily. Decrease caffeine intake.  Follow up in 2 weeks with PCP.  Do not restart Amlodipine.

## 2022-08-02 ENCOUNTER — Ambulatory Visit: Payer: Medicare HMO | Admitting: Family Medicine

## 2022-08-16 ENCOUNTER — Encounter: Payer: Self-pay | Admitting: Family Medicine

## 2022-08-16 ENCOUNTER — Ambulatory Visit (INDEPENDENT_AMBULATORY_CARE_PROVIDER_SITE_OTHER): Payer: Medicare HMO | Admitting: Family Medicine

## 2022-08-16 VITALS — BP 132/80 | HR 94 | Temp 98.1°F | Ht 70.0 in | Wt 274.6 lb

## 2022-08-16 DIAGNOSIS — E1159 Type 2 diabetes mellitus with other circulatory complications: Secondary | ICD-10-CM

## 2022-08-16 DIAGNOSIS — N1831 Chronic kidney disease, stage 3a: Secondary | ICD-10-CM

## 2022-08-16 DIAGNOSIS — E291 Testicular hypofunction: Secondary | ICD-10-CM | POA: Diagnosis not present

## 2022-08-16 DIAGNOSIS — N401 Enlarged prostate with lower urinary tract symptoms: Secondary | ICD-10-CM

## 2022-08-16 DIAGNOSIS — N400 Enlarged prostate without lower urinary tract symptoms: Secondary | ICD-10-CM | POA: Diagnosis not present

## 2022-08-16 DIAGNOSIS — I129 Hypertensive chronic kidney disease with stage 1 through stage 4 chronic kidney disease, or unspecified chronic kidney disease: Secondary | ICD-10-CM

## 2022-08-16 DIAGNOSIS — Z0000 Encounter for general adult medical examination without abnormal findings: Secondary | ICD-10-CM

## 2022-08-16 DIAGNOSIS — Z23 Encounter for immunization: Secondary | ICD-10-CM | POA: Diagnosis not present

## 2022-08-16 DIAGNOSIS — E782 Mixed hyperlipidemia: Secondary | ICD-10-CM | POA: Diagnosis not present

## 2022-08-16 LAB — URINALYSIS, ROUTINE W REFLEX MICROSCOPIC
Bilirubin, UA: NEGATIVE
Ketones, UA: NEGATIVE
Leukocytes,UA: NEGATIVE
Nitrite, UA: NEGATIVE
Protein,UA: NEGATIVE
RBC, UA: NEGATIVE
Specific Gravity, UA: 1.02 (ref 1.005–1.030)
Urobilinogen, Ur: 0.2 mg/dL (ref 0.2–1.0)
pH, UA: 5.5 (ref 5.0–7.5)

## 2022-08-16 LAB — BAYER DCA HB A1C WAIVED: HB A1C (BAYER DCA - WAIVED): 8.1 % — ABNORMAL HIGH (ref 4.8–5.6)

## 2022-08-16 LAB — MICROALBUMIN, URINE WAIVED
Creatinine, Urine Waived: 50 mg/dL (ref 10–300)
Microalb, Ur Waived: 30 mg/L — ABNORMAL HIGH (ref 0–19)

## 2022-08-16 MED ORDER — METFORMIN HCL ER 500 MG PO TB24: 1000.0000 mg | ORAL_TABLET | Freq: Two times a day (BID) | ORAL | 1 refills | Status: DC | PRN

## 2022-08-16 MED ORDER — ALBUTEROL SULFATE HFA 108 (90 BASE) MCG/ACT IN AERS: 2.0000 | INHALATION_SPRAY | RESPIRATORY_TRACT | 3 refills | Status: DC | PRN

## 2022-08-16 MED ORDER — METOPROLOL TARTRATE 100 MG PO TABS: 100.0000 mg | ORAL_TABLET | Freq: Two times a day (BID) | ORAL | 1 refills | Status: DC

## 2022-08-16 MED ORDER — DOXAZOSIN MESYLATE 2 MG PO TABS: 2.0000 mg | ORAL_TABLET | Freq: Two times a day (BID) | ORAL | 1 refills | Status: DC

## 2022-08-16 MED ORDER — ATORVASTATIN CALCIUM 20 MG PO TABS: 10.0000 mg | ORAL_TABLET | Freq: Every day | ORAL | 1 refills | Status: DC

## 2022-08-16 MED ORDER — GABAPENTIN 100 MG PO CAPS: 100.0000 mg | ORAL_CAPSULE | Freq: Three times a day (TID) | ORAL | 1 refills | Status: DC

## 2022-08-16 MED ORDER — BENAZEPRIL HCL 20 MG PO TABS: 20.0000 mg | ORAL_TABLET | Freq: Every day | ORAL | 1 refills | Status: DC

## 2022-08-16 MED ORDER — FUROSEMIDE 20 MG PO TABS: 20.0000 mg | ORAL_TABLET | Freq: Every day | ORAL | 1 refills | Status: DC

## 2022-08-16 NOTE — Assessment & Plan Note (Signed)
Under good control on current regimen. Continue current regimen. Continue to monitor. Call with any concerns. Refills given. Labs drawn today.   

## 2022-08-16 NOTE — Assessment & Plan Note (Signed)
Up with A1c of 8.1. Will really watch diet and recheck in 3 months- if still elevated will add jardiance for heart and sugars. Call with any concerns.

## 2022-08-16 NOTE — Assessment & Plan Note (Signed)
Encouraged diet and exercise with goal of losing 1-2lbs per week. Continue to monitor. Call with any concerns.  

## 2022-08-16 NOTE — Progress Notes (Signed)
BP 132/80   Pulse 94   Temp 98.1 F (36.7 C)   Ht 5\' 10"  (1.778 m)   Wt 274 lb 9.6 oz (124.6 kg)   SpO2 97%   BMI 39.40 kg/m    Subjective:    Patient ID: Jeff Lips., male    DOB: 26-Apr-1951, 71 y.o.   MRN: [id number]  HPI: Jeff Kwak. is a 71 y.o. male presenting on 08/16/2022 for comprehensive medical examination. Current medical complaints include:  DIABETES Hypoglycemic episodes:no Polydipsia/polyuria: no Visual disturbance: no Chest pain: no Paresthesias: no Glucose Monitoring: yes  Accucheck frequency: Daily Taking Insulin?: no Blood Pressure Monitoring: not checking Retinal Examination: Not up to Date Foot Exam: Up to Date Diabetic Education: Completed Pneumovax: Up to Date Influenza: Up to Date Aspirin: no  HYPERTENSION / [place] Satisfied with current treatment? yes Duration of hypertension: chronic BP monitoring frequency: not checking BP medication side effects: no Past BP meds: benazepril. Metoprolol, lasix Duration of hyperlipidemia: chronic Cholesterol medication side effects: no Cholesterol supplements: none Past cholesterol medications: atorvastatin Medication compliance: excellent compliance Aspirin: no Recent stressors: no Recurrent headaches: no Visual changes: no Palpitations: no Dyspnea: no Chest pain: no Lower extremity edema: no Dizzy/lightheaded: no  Interim Problems from his last visit: no  Depression Screen done today and results listed below:     08/16/2022    9:23 AM 07/20/2022   11:35 AM 03/29/2022    2:32 PM 01/21/2022    8:36 AM 08/31/2021    8:37 AM  Depression screen PHQ 2/9  Decreased Interest 0 0 0 0 0  Down, Depressed, Hopeless 0 0 0 0 0  PHQ - 2 Score 0 0 0 0 0  Altered sleeping 0 0 0  0  Tired, decreased energy 0 1 0  0  Change in appetite 0 0 0  0  Feeling bad or failure about yourself  0 0 0  0  Trouble concentrating 0 0 0  0  Moving slowly or fidgety/restless 0 0 0  0  Suicidal  thoughts 0 0 0  0  PHQ-9 Score 0 1 0  0  Difficult doing work/chores Not difficult at all Not difficult at all   Not difficult at all    Past Medical History:  Past Medical History:  Diagnosis Date   Chronic deep vein thrombosis (DVT) (HCC)    Chronic low back pain    Chronic systolic heart failure ([place])    a. TTE 2011 with EF 40-45% per notes   Diabetes mellitus without complication ([place])    Erectile dysfunction    HLD (hyperlipidemia)    Hypertension    Hypogonadism in male    Permanent atrial fibrillation ([place])    a. s/p DCCV 2011; b. redeveloped Afib in 2016; c. CHADS2VASc at least 5 (CHF, HTN, age x 1, DM, vascular disease); d. on Xarelto   Torn meniscus    right knee    Surgical History:  Past Surgical History:  Procedure Laterality Date   COLONOSCOPY WITH PROPOFOL N/A 05/28/2016   Procedure: COLONOSCOPY WITH PROPOFOL;  Surgeon: Lucilla Lame, MD;  Location: [facility];  Service: Endoscopy;  Laterality: N/A;  diabetic - oral meds   DVT, leg Left    INSERTION OF VENA CAVA FILTER  2014   KNEE ARTHROSCOPY WITH MEDIAL MENISECTOMY Right 05/31/2019   Procedure: KNEE ARTHROSCOPY WITH partial  MEDIAL MENISECTOMY;  Surgeon: Leim Fabry, MD;  Location: [facility];  Service: Orthopedics;  Laterality:  Right;   LOWER EXTREMITY VENOGRAPHY Left 08/21/2021   Procedure: LOWER EXTREMITY VENOGRAPHY;  Surgeon: Katha Cabal, MD;  Location: [facility];  Service: Cardiovascular;  Laterality: Left;  Possible IVC Filter Insertion   PERIPHERAL VASCULAR THROMBECTOMY Left 12/01/2017   Procedure: PERIPHERAL VASCULAR THROMBECTOMY;  Surgeon: Katha Cabal, MD;  Location: [facility];  Service: Cardiovascular;  Laterality: Left;    Medications:  Current Outpatient Medications on File Prior to Visit  Medication Sig   Aspirin 325 MG CAPS Take by mouth.   fluticasone (FLONASE) 50 MCG/ACT nasal spray Place 2 sprays into both nostrils daily as needed for  allergies or rhinitis.   rivaroxaban (XARELTO) 20 MG TABS tablet Take 1 tablet (20 mg total) by mouth daily.   tiZANidine (ZANAFLEX) 4 MG tablet Take 1 tablet (4 mg total) by mouth every 6 (six) hours as needed for muscle spasms.   No current facility-administered medications on file prior to visit.    Allergies:  No Known Allergies  Social History:  Social History   Socioeconomic History   Marital status: Single    Spouse name: Not on file   Number of children: Not on file   Years of education: Not on file   Highest education level: Some college, no degree  Occupational History   Occupation: [employer]   Tobacco Use   Smoking status: Never   Smokeless tobacco: Never  Vaping Use   Vaping Use: Never used  Substance and Sexual Activity   Alcohol use: No    Alcohol/week: 0.0 standard drinks of alcohol   Drug use: No   Sexual activity: Yes  Other Topics Concern   Not on file  Social History Narrative   Former Engineer, structural    Independent at baseline. [employer]   Works at [employer] Strain: [place]  (01/21/2022)   Overall Financial Resource Strain (CARDIA)    Difficulty of Paying Living Expenses: Not hard at all  Food Insecurity: No Food Insecurity (01/21/2022)   Hunger Vital Sign    Worried About Running Out of Food in the Last Year: Never true    [place] in the Last Year: Never true  Transportation Needs: No Transportation Needs (01/21/2022)   PRAPARE - Hydrologist (Medical): No    Lack of Transportation (Non-Medical): No  Physical Activity: Sufficiently Active (01/21/2022)   Exercise Vital Sign    Days of Exercise per Week: 5 days    Minutes of Exercise per Session: 50 min  Stress: No Stress Concern Present (01/21/2022)   [place]    Feeling of Stress : Not at all  Social Connections: Unknown  (01/21/2022)   Social Connection and Isolation Panel [NHANES]    Frequency of Communication with Friends and Family: More than three times a week    Frequency of Social Gatherings with Friends and Family: More than three times a week    Attends Religious Services: More than 4 times per year    Active Member of [employer] or Organizations: Yes    Attends Archivist Meetings: More than 4 times per year    Marital Status: Not on file  Intimate Partner Violence: Not At Risk (01/21/2022)   Humiliation, Afraid, Rape, and Kick questionnaire    Fear of Current or Ex-Partner: No    Emotionally Abused: No  Physically Abused: No    Sexually Abused: No   Social History   Tobacco Use  Smoking Status Never  Smokeless Tobacco Never   Social History   Substance and Sexual Activity  Alcohol Use No   Alcohol/week: 0.0 standard drinks of alcohol    Family History:  Family History  Problem Relation Age of Onset   Lymphoma Mother    Heart failure Father    Diabetes Father    Atrial fibrillation Brother    Diabetes Mellitus II Other    Heart attack Neg Hx    Hypertension Neg Hx    Cancer Neg Hx    COPD Neg Hx    Stroke Neg Hx    Prostate cancer Neg Hx    Kidney cancer Neg Hx    Bladder Cancer Neg Hx     Past medical history, surgical history, medications, allergies, family history and social history reviewed with patient today and changes made to appropriate areas of the chart.   Review of Systems  Constitutional: Negative.   HENT: Negative.    Eyes: Negative.   Respiratory: Negative.    Cardiovascular: Negative.   Gastrointestinal: Negative.   Genitourinary: Negative.   Musculoskeletal:  Positive for back pain. Negative for falls, joint pain, myalgias and neck pain.  Skin: Negative.   Neurological: Negative.   Endo/Heme/Allergies:  Positive for environmental allergies. Negative for polydipsia. Does not bruise/bleed easily.  Psychiatric/Behavioral: Negative.     All other  ROS negative except what is listed above and in the HPI.      Objective:    BP 132/80   Pulse 94   Temp 98.1 F (36.7 C)   Ht 5\' 10"  (1.778 m)   Wt 274 lb 9.6 oz (124.6 kg)   SpO2 97%   BMI 39.40 kg/m   Wt Readings from Last 3 Encounters:  08/16/22 274 lb 9.6 oz (124.6 kg)  07/20/22 258 lb 9.6 oz (117.3 kg)  06/07/22 263 lb 8 oz (119.5 kg)    Physical Exam Vitals and nursing note reviewed.  Constitutional:      General: He is not in acute distress.    Appearance: Normal appearance. He is obese. He is not ill-appearing, toxic-appearing or diaphoretic.  HENT:     Head: Normocephalic and atraumatic.     Right Ear: Tympanic membrane, ear canal and external ear normal. There is no impacted cerumen.     Left Ear: Tympanic membrane, ear canal and external ear normal. There is no impacted cerumen.     Nose: Nose normal. No congestion or rhinorrhea.     Mouth/Throat:     Mouth: Mucous membranes are moist.     Pharynx: Oropharynx is clear. No oropharyngeal exudate or posterior oropharyngeal erythema.  Eyes:     General: No scleral icterus.       Right eye: No discharge.        Left eye: No discharge.     Extraocular Movements: Extraocular movements intact.     Conjunctiva/sclera: Conjunctivae normal.     Pupils: Pupils are equal, round, and reactive to light.  Neck:     Vascular: No carotid bruit.  Cardiovascular:     Rate and Rhythm: Normal rate and regular rhythm.     Pulses: Normal pulses.     Heart sounds: No murmur heard.    No friction rub. No gallop.  Pulmonary:     Effort: Pulmonary effort is normal. No respiratory distress.     Breath sounds: Normal  breath sounds. No stridor. No wheezing, rhonchi or rales.  Chest:     Chest wall: No tenderness.  Abdominal:     General: Abdomen is flat. Bowel sounds are normal. There is no distension.     Palpations: Abdomen is soft. There is no mass.     Tenderness: There is no abdominal tenderness. There is no right CVA  tenderness, left CVA tenderness, guarding or rebound.     Hernia: No hernia is present.  Genitourinary:    Comments: Genital exam deferred with shared decision making Musculoskeletal:        General: No swelling, tenderness, deformity or signs of injury.     Cervical back: Normal range of motion and neck supple. No rigidity. No muscular tenderness.     Right lower leg: No edema.     Left lower leg: No edema.  Lymphadenopathy:     Cervical: No cervical adenopathy.  Skin:    General: Skin is warm and dry.     Capillary Refill: Capillary refill takes less than 2 seconds.     Coloration: Skin is not jaundiced or pale.     Findings: No bruising, erythema, lesion or rash.  Neurological:     General: No focal deficit present.     Mental Status: He is alert and oriented to person, place, and time.     Cranial Nerves: No cranial nerve deficit.     Sensory: No sensory deficit.     Motor: No weakness.     Coordination: Coordination normal.     Gait: Gait normal.     Deep Tendon Reflexes: Reflexes normal.  Psychiatric:        Mood and Affect: Mood normal.        Behavior: Behavior normal.        Thought Content: Thought content normal.        Judgment: Judgment normal.     Results for orders placed or performed in visit on 03/29/22  Bayer DCA Hb A1c Waived  Result Value Ref Range   HB A1C (BAYER DCA - WAIVED) 6.7 (H) 4.8 - 5.6 %      Assessment & Plan:   Problem List Items Addressed This Visit       Endocrine   Diabetes mellitus ([place])    Up with A1c of 8.1. Will really watch diet and recheck in 3 months- if still elevated will add jardiance for heart and sugars. Call with any concerns.       Relevant Medications   metFORMIN (GLUCOPHAGE-XR) 500 MG 24 hr tablet   benazepril (LOTENSIN) 20 MG tablet   atorvastatin (LIPITOR) 20 MG tablet   Other Relevant Orders   Microalbumin, Urine Waived   Comprehensive metabolic panel   CBC with Differential/Platelet   Lipid Panel w/o  Chol/HDL Ratio   PSA   TSH   Urinalysis, Routine w reflex microscopic   Bayer DCA Hb A1c Waived   Hypogonadism in male    Doing OK off medicine. Continue to monitor. Call with any concerns.         Genitourinary   Hypertensive renal disease    Under good control on current regimen. Continue current regimen. Continue to monitor. Call with any concerns. Refills given. Labs drawn today.       BPH (benign prostatic hyperplasia)    Under good control on current regimen. Continue current regimen. Continue to monitor. Call with any concerns. Refills given. Labs drawn today.       Stage 3a  chronic kidney disease ([place])    Rechecking labs today. Await results. Treat as needed.         Other   Mixed hyperlipidemia    Under good control on current regimen. Continue current regimen. Continue to monitor. Call with any concerns. Refills given. Labs drawn today.       Relevant Medications   metoprolol tartrate (LOPRESSOR) 100 MG tablet   furosemide (LASIX) 20 MG tablet   doxazosin (CARDURA) 2 MG tablet   benazepril (LOTENSIN) 20 MG tablet   atorvastatin (LIPITOR) 20 MG tablet   Morbid obesity (HCC)    Encouraged diet and exercise with goal of losing 1-2lbs per week. Continue to monitor. Call with any concerns.       Relevant Medications   metFORMIN (GLUCOPHAGE-XR) 500 MG 24 hr tablet   Other Visit Diagnoses     Routine general medical examination at a health care facility    -  Primary   Vaccines up to date. Screening labs checked today. Colonoscopy up to date. Continue diet and exercise. Call with any concerns.    Need for influenza vaccination       Relevant Orders   Flu Vaccine QUAD High Dose(Fluad) (Completed)        LABORATORY TESTING:  Health maintenance labs ordered today as discussed above.   The natural history of prostate cancer and ongoing controversy regarding screening and potential treatment outcomes of prostate cancer has been discussed with the patient. The  meaning of a false positive PSA and a false negative PSA has been discussed. He indicates understanding of the limitations of this screening test and wishes to proceed with screening PSA testing.   IMMUNIZATIONS:   - Tdap: Tetanus vaccination status reviewed: last tetanus booster within 10 years. - Influenza: Up to date - Pneumovax: Up to date - Prevnar: Up to date - COVID: Up to date - HPV: Not applicable - Shingrix vaccine: Given elsewhere  SCREENING: - Colonoscopy: Up to date  Discussed with patient purpose of the colonoscopy is to detect colon cancer at curable precancerous or early stages   PATIENT COUNSELING:    Sexuality: Discussed sexually transmitted diseases, partner selection, use of condoms, avoidance of unintended pregnancy  and contraceptive alternatives.   Advised to avoid cigarette smoking.  I discussed with the patient that most people either abstain from alcohol or drink within safe limits (<=14/week and <=4 drinks/occasion for males, <=7/weeks and <= 3 drinks/occasion for females) and that the risk for alcohol disorders and other health effects rises proportionally with the number of drinks per week and how often a drinker exceeds daily limits.  Discussed cessation/primary prevention of drug use and availability of treatment for abuse.   Diet: Encouraged to adjust caloric intake to maintain  or achieve ideal body weight, to reduce intake of dietary saturated fat and total fat, to limit sodium intake by avoiding high sodium foods and not adding table salt, and to maintain adequate dietary potassium and calcium preferably from fresh fruits, vegetables, and low-fat dairy products.    stressed the importance of regular exercise  Injury prevention: Discussed safety belts, safety helmets, smoke detector, smoking near bedding or upholstery.   Dental health: Discussed importance of regular tooth brushing, flossing, and dental visits.   Follow up plan: NEXT PREVENTATIVE  PHYSICAL DUE IN 1 YEAR. Return in about 3 months (around 11/16/2022).

## 2022-08-16 NOTE — Assessment & Plan Note (Signed)
Rechecking labs today. Await results. Treat as needed.  °

## 2022-08-16 NOTE — Assessment & Plan Note (Signed)
Doing OK off medicine. Continue to monitor. Call with any concerns.  

## 2022-08-17 LAB — COMPREHENSIVE METABOLIC PANEL
ALT: 40 IU/L (ref 0–44)
AST: 27 IU/L (ref 0–40)
Albumin/Globulin Ratio: 2.4 — ABNORMAL HIGH (ref 1.2–2.2)
Albumin: 4.5 g/dL (ref 3.8–4.8)
Alkaline Phosphatase: 75 IU/L (ref 44–121)
BUN/Creatinine Ratio: 18 (ref 10–24)
BUN: 26 mg/dL (ref 8–27)
Bilirubin Total: 0.6 mg/dL (ref 0.0–1.2)
CO2: 24 mmol/L (ref 20–29)
Calcium: 9.5 mg/dL (ref 8.6–10.2)
Chloride: 101 mmol/L (ref 96–106)
Creatinine, Ser: 1.46 mg/dL — ABNORMAL HIGH (ref 0.76–1.27)
Globulin, Total: 1.9 g/dL (ref 1.5–4.5)
Glucose: 221 mg/dL — ABNORMAL HIGH (ref 70–99)
Potassium: 5.3 mmol/L — ABNORMAL HIGH (ref 3.5–5.2)
Sodium: 139 mmol/L (ref 134–144)
Total Protein: 6.4 g/dL (ref 6.0–8.5)
eGFR: 51 mL/min/{1.73_m2} — ABNORMAL LOW (ref >59)

## 2022-08-17 LAB — CBC WITH DIFFERENTIAL/PLATELET
Basophils Absolute: 0 10*3/uL (ref 0.0–0.2)
Basos: 1 %
EOS (ABSOLUTE): 0.1 10*3/uL (ref 0.0–0.4)
Eos: 2 %
Hematocrit: 45.4 % (ref 37.5–51.0)
Hemoglobin: 14.7 g/dL (ref 13.0–17.7)
Immature Grans (Abs): 0 10*3/uL (ref 0.0–0.1)
Immature Granulocytes: 1 %
Lymphocytes Absolute: 0.7 10*3/uL (ref 0.7–3.1)
Lymphs: 12 %
MCH: 29.6 pg (ref 26.6–33.0)
MCHC: 32.4 g/dL (ref 31.5–35.7)
MCV: 91 fL (ref 79–97)
Monocytes Absolute: 0.5 10*3/uL (ref 0.1–0.9)
Monocytes: 9 %
Neutrophils Absolute: 4.3 10*3/uL (ref 1.4–7.0)
Neutrophils: 75 %
Platelets: 174 10*3/uL (ref 150–450)
RBC: 4.97 x10E6/uL (ref 4.14–5.80)
RDW: 13.1 % (ref 11.6–15.4)
WBC: 5.7 10*3/uL (ref 3.4–10.8)

## 2022-08-17 LAB — PSA: Prostate Specific Ag, Serum: 1.8 ng/mL (ref 0.0–4.0)

## 2022-08-17 LAB — LIPID PANEL W/O CHOL/HDL RATIO
Cholesterol, Total: 134 mg/dL (ref 100–199)
HDL: 43 mg/dL (ref >39)
LDL Chol Calc (NIH): 76 mg/dL (ref 0–99)
Triglycerides: 75 mg/dL (ref 0–149)
VLDL Cholesterol Cal: 15 mg/dL (ref 5–40)

## 2022-08-17 LAB — TSH: TSH: 2.7 u[IU]/mL (ref 0.450–4.500)

## 2022-08-19 ENCOUNTER — Other Ambulatory Visit: Payer: Self-pay | Admitting: Family Medicine

## 2022-08-19 DIAGNOSIS — N289 Disorder of kidney and ureter, unspecified: Secondary | ICD-10-CM

## 2022-08-31 ENCOUNTER — Ambulatory Visit: Payer: Self-pay

## 2022-08-31 NOTE — Telephone Encounter (Signed)
  Chief Complaint: Sinus congestion, drainage, cough Symptoms:  Frequency:  Pertinent Negatives: Patient denies  Disposition: [] ED /[] Urgent Care (no appt availability in office) / [] Appointment(In office/virtual)/ []  [facility]/ [] Home Care/ [] Refused Recommended Disposition /[] [facility]/ [x]  Follow-up with PCP Additional Notes: Pt called with sinus pressure and drainage. PT also has a cough that produces white phlegm. Pt would like medications called in. In the past a cough medicine and a "sulfa" medication was called in by PCP.  Pt refused appt.    Summary: cough and head congestion   Pt requesting a Rx for cough and head congestion   Pt declined next appt for 10-25   Please assist further      Answer Assessment - Initial Assessment Questions 1. LOCATION: "Where does it hurt?"       2. ONSET: "When did the sinus pain start?"  (e.g., hours, days)       3. SEVERITY: "How bad is the pain?"   (Scale 1-10; mild, moderate or severe)   - MILD (1-3): doesn't interfere with normal activities    - MODERATE (4-7): interferes with normal activities (e.g., work or school) or awakens from sleep   - SEVERE (8-10): excruciating pain and patient unable to do any normal activities         4. RECURRENT SYMPTOM: "Have you ever had sinus problems before?" If Yes, ask: "When was the last time?" and "What happened that time?"      yes 5. NASAL CONGESTION: "Is the nose blocked?" If Yes, ask: "Can you open it or must you breathe through your mouth?"     Can breath through nose 6. NASAL DISCHARGE: "Do you have discharge from your nose?" If so ask, "What color?"      7. FEVER: "Do you have a fever?" If Yes, ask: "What is it, how was it measured, and when did it start?"       8. OTHER SYMPTOMS: "Do you have any other symptoms?" (e.g., sore throat, cough, earache, difficulty breathing)     Productive cough 9. PREGNANCY: "Is there any chance you are pregnant?" "When was your last  menstrual period?"  Protocols used: Sinus Pain or Congestion-A-AH

## 2022-08-31 NOTE — Telephone Encounter (Signed)
Patient needs an appointment for medication to be sent in.

## 2022-09-01 ENCOUNTER — Encounter: Payer: Self-pay | Admitting: Family Medicine

## 2022-09-01 ENCOUNTER — Ambulatory Visit (INDEPENDENT_AMBULATORY_CARE_PROVIDER_SITE_OTHER): Payer: Medicare HMO | Admitting: Family Medicine

## 2022-09-01 DIAGNOSIS — J069 Acute upper respiratory infection, unspecified: Secondary | ICD-10-CM | POA: Diagnosis not present

## 2022-09-01 MED ORDER — PREDNISONE 50 MG PO TABS: 50.0000 mg | ORAL_TABLET | Freq: Every day | ORAL | 0 refills | Status: DC

## 2022-09-01 MED ORDER — HYDROCOD POLI-CHLORPHE POLI ER 10-8 MG/5ML PO SUER: 5.0000 mL | Freq: Two times a day (BID) | ORAL | 0 refills | Status: DC | PRN

## 2022-09-01 NOTE — Telephone Encounter (Signed)
Called Mr. Coker to let him know that he will need an appointment to get medication. He proceeded to say "Well Dr. Wynetta Emery is my doctor and all she has to do is look at the history and send the medication" I let him know that is not the protocol for getting antibiotics. He said "well just forget it then" and hung up in my face.

## 2022-09-01 NOTE — Progress Notes (Signed)
There were no vitals taken for this visit.   Subjective:    Patient ID: Jeff [name]., male    DOB: August 26, 1951, 71 y.o.   MRN: [id number]  HPI: Jeff [name]. is a 71 y.o. male  Chief Complaint  Patient presents with   URI    Pt states he has been having congestion, cough, and sinus pressure since Sunday. States he has not had a fever or sore throat. Requesting a sulfa antibiotic.    UPPER RESPIRATORY TRACT INFECTION Duration: 4 days Worst symptom: congestion Fever: no Cough: yes Shortness of breath: no Wheezing: no Chest pain: no Chest tightness: no Chest congestion: no Nasal congestion: yes Runny nose: yes Post nasal drip: yes Sneezing: yes Sore throat: yes Swollen glands: no Sinus pressure: yes Headache: yes Face pain: no Toothache: no Ear pain: no  Ear pressure: no  Eyes red/itching:no Eye drainage/crusting: no  Vomiting: no Rash: no Fatigue: no Sick contacts: no Strep contacts: no  Context: stable Recurrent sinusitis: no Relief with OTC cold/cough medications: no  Treatments attempted: mucinex   Relevant past medical, surgical, family and social history reviewed and updated as indicated. Interim medical history since our last visit reviewed. Allergies and medications reviewed and updated.  Review of Systems  Constitutional: Negative.   Respiratory: Negative.    Cardiovascular: Negative.   Gastrointestinal: Negative.   Musculoskeletal: Negative.   Neurological: Negative.   Psychiatric/Behavioral: Negative.      Per HPI unless specifically indicated above     Objective:    There were no vitals taken for this visit.  Wt Readings from Last 3 Encounters:  08/16/22 274 lb 9.6 oz (124.6 kg)  07/20/22 258 lb 9.6 oz (117.3 kg)  06/07/22 263 lb 8 oz (119.5 kg)    Physical Exam Vitals and nursing note reviewed.  Pulmonary:     Effort: Pulmonary effort is normal. No respiratory distress.     Comments: Speaking in full  sentences Neurological:     Mental Status: He is alert.  Psychiatric:        Mood and Affect: Mood normal.        Behavior: Behavior normal.        Thought Content: Thought content normal.        Judgment: Judgment normal.     Results for orders placed or performed in visit on 08/16/22  Microalbumin, Urine Waived  Result Value Ref Range   Microalb, Ur Waived 30 (H) 0 - 19 mg/L   Creatinine, Urine Waived 50 10 - 300 mg/dL   Microalb/Creat Ratio 30-300 (H) <30 mg/g  Comprehensive metabolic panel  Result Value Ref Range   Glucose 221 (H) 70 - 99 mg/dL   BUN 26 8 - 27 mg/dL   Creatinine, Ser 1.46 (H) 0.76 - 1.27 mg/dL   eGFR 51 (L) >59 mL/min/1.73   BUN/Creatinine Ratio 18 10 - 24   Sodium 139 134 - 144 mmol/L   Potassium 5.3 (H) 3.5 - 5.2 mmol/L   Chloride 101 96 - 106 mmol/L   CO2 24 20 - 29 mmol/L   Calcium 9.5 8.6 - 10.2 mg/dL   Total Protein 6.4 6.0 - 8.5 g/dL   Albumin 4.5 3.8 - 4.8 g/dL   Globulin, Total 1.9 1.5 - 4.5 g/dL   Albumin/Globulin Ratio 2.4 (H) 1.2 - 2.2   Bilirubin Total 0.6 0.0 - 1.2 mg/dL   Alkaline Phosphatase 75 44 - 121 IU/L   AST 27 0 - 40 IU/L  ALT 40 0 - 44 IU/L  CBC with Differential/Platelet  Result Value Ref Range   WBC 5.7 3.4 - 10.8 x10E3/uL   RBC 4.97 4.14 - 5.80 x10E6/uL   Hemoglobin 14.7 13.0 - 17.7 g/dL   Hematocrit 45.4 37.5 - 51.0 %   MCV 91 79 - 97 fL   MCH 29.6 26.6 - 33.0 pg   MCHC 32.4 31.5 - 35.7 g/dL   RDW 13.1 11.6 - 15.4 %   Platelets 174 150 - 450 x10E3/uL   Neutrophils 75 Not Estab. %   Lymphs 12 Not Estab. %   Monocytes 9 Not Estab. %   Eos 2 Not Estab. %   Basos 1 Not Estab. %   Neutrophils Absolute 4.3 1.4 - 7.0 x10E3/uL   Lymphocytes Absolute 0.7 0.7 - 3.1 x10E3/uL   Monocytes Absolute 0.5 0.1 - 0.9 x10E3/uL   EOS (ABSOLUTE) 0.1 0.0 - 0.4 x10E3/uL   Basophils Absolute 0.0 0.0 - 0.2 x10E3/uL   Immature Granulocytes 1 Not Estab. %   Immature Grans (Abs) 0.0 0.0 - 0.1 x10E3/uL  Lipid Panel w/o Chol/HDL Ratio   Result Value Ref Range   Cholesterol, Total 134 100 - 199 mg/dL   Triglycerides 75 0 - 149 mg/dL   HDL 43 >39 mg/dL   VLDL Cholesterol Cal 15 5 - 40 mg/dL   LDL Chol Calc (NIH) 76 0 - 99 mg/dL  PSA  Result Value Ref Range   Prostate Specific Ag, Serum 1.8 0.0 - 4.0 ng/mL  TSH  Result Value Ref Range   TSH 2.700 0.450 - 4.500 uIU/mL  Urinalysis, Routine w reflex microscopic  Result Value Ref Range   Specific Gravity, UA 1.020 1.005 - 1.030   pH, UA 5.5 5.0 - 7.5   Color, UA Yellow Yellow   Appearance Ur Clear Clear   Leukocytes,UA Negative Negative   Protein,UA Negative Negative/Trace   Glucose, UA 2+ (A) Negative   Ketones, UA Negative Negative   RBC, UA Negative Negative   Bilirubin, UA Negative Negative   Urobilinogen, Ur 0.2 0.2 - 1.0 mg/dL   Nitrite, UA Negative Negative  Bayer DCA Hb A1c Waived  Result Value Ref Range   HB A1C (BAYER DCA - WAIVED) 8.1 (H) 4.8 - 5.6 %      Assessment & Plan:   Problem List Items Addressed This Visit   None Visit Diagnoses     Upper respiratory tract infection, unspecified type    -  Primary   Will treat symptomatically with prednisone and tussionex. If not getting better by Friday OK to call in doxycycline for sinusitis.         Follow up plan: Return if symptoms worsen or fail to improve.    This visit was completed via telephone due to the restrictions of the COVID-19 pandemic. All issues as above were discussed and addressed but no physical exam was performed. If it was felt that the patient should be evaluated in the office, they were directed there. The patient verbally consented to this visit. Patient was unable to complete an audio/visual visit due to Lack of equipment. Due to the catastrophic nature of the COVID-19 pandemic, this visit was done through audio contact only. Location of the patient: home Location of the provider: work Those involved with this call:  Provider: Park Liter, DO CMA: Louanna Raw,  CMA Front Desk/Registration: [employer]  Time spent on call:  21 minutes on the phone discussing health concerns. 30 minutes total spent in  review of patient's record and preparation of their chart.

## 2022-09-01 NOTE — Telephone Encounter (Signed)
Unfortunately I can't call in an antibiotic without seeing him, but I can do a virtual visit today if he'd like. (OK to double book)

## 2022-09-01 NOTE — Telephone Encounter (Signed)
Patient scheduled and checked in for appointment. Provider aware.

## 2022-09-06 ENCOUNTER — Encounter (INDEPENDENT_AMBULATORY_CARE_PROVIDER_SITE_OTHER): Payer: Self-pay

## 2022-11-22 ENCOUNTER — Ambulatory Visit: Payer: Medicare HMO | Admitting: Family Medicine

## 2022-12-10 ENCOUNTER — Other Ambulatory Visit: Payer: Self-pay | Admitting: Family Medicine

## 2022-12-10 NOTE — Telephone Encounter (Signed)
Requested Prescriptions  Pending Prescriptions Disp Refills   fluticasone (FLONASE) 50 MCG/ACT nasal spray [Pharmacy Med Name: FLUTICASONE PROP 50 MCG SPRAY] 48 mL 2    Sig: PLACE 2 SPRAYS INTO BOTH NOSTRILS DAILY AS NEEDED FOR ALLERGIES OR RHINITIS.     Ear, Nose, and Throat: Nasal Preparations - Corticosteroids Passed - 12/10/2022  1:25 AM      Passed - Valid encounter within last 12 months    Recent Outpatient Visits           3 months ago Upper respiratory tract infection, unspecified type   [place], [place] P, DO   3 months ago Routine general medical examination at a health care facility   [facility], [place] P, DO   4 months ago Chronic right-sided low back pain with right-sided sciatica   [place] Jon Billings, NP   8 months ago Type 2 diabetes mellitus with other circulatory complication, without long-term current use of insulin [facility])   [place], Megan P, DO   11 months ago Upper respiratory tract infection, unspecified type   [place], Barb Merino, DO       Future Appointments             In 1 week Wynetta Emery, Barb Merino, DO [place], [facility]

## 2022-12-21 ENCOUNTER — Ambulatory Visit: Payer: Medicare HMO | Admitting: Family Medicine

## 2022-12-29 ENCOUNTER — Other Ambulatory Visit: Payer: Self-pay | Admitting: Family Medicine

## 2022-12-29 NOTE — Telephone Encounter (Signed)
Rx- 08/16/22 #180 1RF- 6 month supply Requested Prescriptions  Pending Prescriptions Disp Refills   doxazosin (CARDURA) 2 MG tablet [Pharmacy Med Name: DOXAZOSIN MESYLATE 2 MG TAB] 180 tablet 1    Sig: TAKE 1 TABLET BY MOUTH 2 TIMES DAILY.     Cardiovascular:  Alpha Blockers Passed - 12/29/2022 12:57 PM      Passed - Last BP in normal range    BP Readings from Last 1 Encounters:  08/16/22 132/80         Passed - Valid encounter within last 6 months    Recent Outpatient Visits           3 months ago Upper respiratory tract infection, unspecified type   [place], [place] P, DO   4 months ago Routine general medical examination at a health care facility   [facility], [place] P, DO   5 months ago Chronic right-sided low back pain with right-sided sciatica   [place] Jon Billings, NP   9 months ago Type 2 diabetes mellitus with other circulatory complication, without long-term current use of insulin ([place])   [place], Megan P, DO   1 year ago Upper respiratory tract infection, unspecified type   [place], [place], DO       Future Appointments             In 2 weeks Wynetta Emery, Barb Merino, DO [place]

## 2023-01-06 ENCOUNTER — Telehealth: Payer: Self-pay | Admitting: Family Medicine

## 2023-01-06 NOTE — Telephone Encounter (Signed)
Contacted [name]. to schedule their annual wellness visit. Appointment made for 01/31/2023.  Jeff [name]; Care Guide [facility] Direct Dial: [phone]

## 2023-01-18 ENCOUNTER — Ambulatory Visit (INDEPENDENT_AMBULATORY_CARE_PROVIDER_SITE_OTHER): Payer: Medicare HMO | Admitting: Family Medicine

## 2023-01-18 ENCOUNTER — Encounter: Payer: Self-pay | Admitting: Family Medicine

## 2023-01-18 VITALS — BP 156/82 | HR 75 | Temp 98.3°F | Ht 70.0 in | Wt 280.5 lb

## 2023-01-18 DIAGNOSIS — E291 Testicular hypofunction: Secondary | ICD-10-CM

## 2023-01-18 DIAGNOSIS — E1159 Type 2 diabetes mellitus with other circulatory complications: Secondary | ICD-10-CM

## 2023-01-18 DIAGNOSIS — E782 Mixed hyperlipidemia: Secondary | ICD-10-CM | POA: Diagnosis not present

## 2023-01-18 DIAGNOSIS — N1831 Chronic kidney disease, stage 3a: Secondary | ICD-10-CM

## 2023-01-18 DIAGNOSIS — D582 Other hemoglobinopathies: Secondary | ICD-10-CM

## 2023-01-18 DIAGNOSIS — I482 Chronic atrial fibrillation, unspecified: Secondary | ICD-10-CM

## 2023-01-18 DIAGNOSIS — N401 Enlarged prostate with lower urinary tract symptoms: Secondary | ICD-10-CM

## 2023-01-18 DIAGNOSIS — I129 Hypertensive chronic kidney disease with stage 1 through stage 4 chronic kidney disease, or unspecified chronic kidney disease: Secondary | ICD-10-CM

## 2023-01-18 LAB — BAYER DCA HB A1C WAIVED: HB A1C (BAYER DCA - WAIVED): 9.8 % — ABNORMAL HIGH (ref 4.8–5.6)

## 2023-01-18 MED ORDER — BENAZEPRIL HCL 20 MG PO TABS: 20.0000 mg | ORAL_TABLET | Freq: Every day | ORAL | 1 refills | Status: DC

## 2023-01-18 MED ORDER — GABAPENTIN 100 MG PO CAPS: 100.0000 mg | ORAL_CAPSULE | Freq: Three times a day (TID) | ORAL | 1 refills | Status: DC

## 2023-01-18 MED ORDER — METOPROLOL TARTRATE 100 MG PO TABS: 100.0000 mg | ORAL_TABLET | Freq: Two times a day (BID) | ORAL | 1 refills | Status: DC

## 2023-01-18 MED ORDER — RIVAROXABAN 20 MG PO TABS: 20.0000 mg | ORAL_TABLET | Freq: Every day | ORAL | 1 refills | Status: DC

## 2023-01-18 MED ORDER — ATORVASTATIN CALCIUM 20 MG PO TABS: 10.0000 mg | ORAL_TABLET | Freq: Every day | ORAL | 1 refills | Status: DC

## 2023-01-18 MED ORDER — DOXAZOSIN MESYLATE 2 MG PO TABS: 2.0000 mg | ORAL_TABLET | Freq: Two times a day (BID) | ORAL | 1 refills | Status: DC

## 2023-01-18 MED ORDER — FUROSEMIDE 20 MG PO TABS: 20.0000 mg | ORAL_TABLET | Freq: Every day | ORAL | 1 refills | Status: DC

## 2023-01-18 MED ORDER — METFORMIN HCL ER 500 MG PO TB24: 1000.0000 mg | ORAL_TABLET | Freq: Two times a day (BID) | ORAL | 1 refills | Status: DC | PRN

## 2023-01-18 MED ORDER — FLUTICASONE PROPIONATE 50 MCG/ACT NA SUSP: 2.0000 | Freq: Every day | NASAL | 2 refills | Status: DC | PRN

## 2023-01-18 MED ORDER — ALBUTEROL SULFATE HFA 108 (90 BASE) MCG/ACT IN AERS: 2.0000 | INHALATION_SPRAY | RESPIRATORY_TRACT | 3 refills | Status: DC | PRN

## 2023-01-18 NOTE — Assessment & Plan Note (Signed)
Under good control on current regimen. Continue current regimen. Continue to monitor. Call with any concerns. Refills given. Labs drawn today.   

## 2023-01-18 NOTE — Progress Notes (Signed)
BP (!) 156/82   Pulse 75   Temp 98.3 F (36.8 C) (Oral)   Ht '5\' 10"'$  (1.778 m)   Wt 280 lb 8 oz (127.2 kg)   SpO2 97%   BMI 40.25 kg/m    Subjective:    Patient ID: Jeff Lips., male    DOB: Nov 26, 1950, 72 y.o.   MRN: [id number]  HPI: Jeff Floerke. is a 72 y.o. male  Chief Complaint  Patient presents with   Diabetes    Patient declines having a most recent Eye Exam. Patient says he will scheduled an appointment.    Hyperlipidemia   DIABETES Hypoglycemic episodes:no Polydipsia/polyuria: no Visual disturbance: no Chest pain: no Paresthesias: no Glucose Monitoring: yes  Accucheck frequency: 130s/80s Taking Insulin?: no Blood Pressure Monitoring: not checking Retinal Examination: Not up to Date Foot Exam: Up to Date Diabetic Education: Completed Pneumovax: Up to Date Influenza: Up to Date Aspirin: no  HYPERTENSION / [place] Satisfied with current treatment? yes Duration of hypertension: chronic BP monitoring frequency: a few times a month BP range: 130s/80s BP medication side effects: no Past BP meds: lasix, benazepril, doxazosin, metoprolol Duration of hyperlipidemia: chronic Cholesterol medication side effects: no Cholesterol supplements: none Past cholesterol medications: atorvastatin Medication compliance: excellent compliance Aspirin: yes Recent stressors: no Recurrent headaches: no Visual changes: no Palpitations: no Dyspnea: no Chest pain: no Lower extremity edema: no Dizzy/lightheaded: no  LOW TESTOSTERONE Duration: chronic Status: stable  Satisfied with current treatment:  yes Medication side effects:  N/A Medication compliance: N/A Decreased libido: no Fatigue: no Depressed mood: no Muscle weakness: no Erectile dysfunction: no  BPH BPH status: controlled Satisfied with current treatment?: yes Medication side effects: no Medication compliance: excellent compliance Duration: chronic Nocturia: 1/night Urinary  frequency:no Incomplete voiding: no Urgency: no Weak urinary stream: no Straining to start stream: no Dysuria: no Onset: gradual Severity: mild  Relevant past medical, surgical, family and social history reviewed and updated as indicated. Interim medical history since our last visit reviewed. Allergies and medications reviewed and updated.  Review of Systems  Constitutional: Negative.   Respiratory: Negative.    Cardiovascular: Negative.   Gastrointestinal: Negative.   Musculoskeletal: Negative.   Neurological: Negative.   Psychiatric/Behavioral: Negative.      Per HPI unless specifically indicated above     Objective:    BP (!) 156/82   Pulse 75   Temp 98.3 F (36.8 C) (Oral)   Ht '5\' 10"'$  (1.778 m)   Wt 280 lb 8 oz (127.2 kg)   SpO2 97%   BMI 40.25 kg/m   Wt Readings from Last 3 Encounters:  01/18/23 280 lb 8 oz (127.2 kg)  08/16/22 274 lb 9.6 oz (124.6 kg)  07/20/22 258 lb 9.6 oz (117.3 kg)    Physical Exam Vitals and nursing note reviewed.  Constitutional:      General: He is not in acute distress.    Appearance: Normal appearance. He is not ill-appearing, toxic-appearing or diaphoretic.  HENT:     Head: Normocephalic and atraumatic.     Right Ear: External ear normal.     Left Ear: External ear normal.     Nose: Nose normal.     Mouth/Throat:     Mouth: Mucous membranes are moist.     Pharynx: Oropharynx is clear.  Eyes:     General: No scleral icterus.       Right eye: No discharge.        Left eye: No discharge.  Extraocular Movements: Extraocular movements intact.     Conjunctiva/sclera: Conjunctivae normal.     Pupils: Pupils are equal, round, and reactive to light.  Cardiovascular:     Rate and Rhythm: Normal rate and regular rhythm.     Pulses: Normal pulses.     Heart sounds: Normal heart sounds. No murmur heard.    No friction rub. No gallop.  Pulmonary:     Effort: Pulmonary effort is normal. No respiratory distress.     Breath  sounds: Normal breath sounds. No stridor. No wheezing, rhonchi or rales.  Chest:     Chest wall: No tenderness.  Musculoskeletal:        General: Normal range of motion.     Cervical back: Normal range of motion and neck supple.  Skin:    General: Skin is warm and dry.     Capillary Refill: Capillary refill takes less than 2 seconds.     Coloration: Skin is not jaundiced or pale.     Findings: No bruising, erythema, lesion or rash.  Neurological:     General: No focal deficit present.     Mental Status: He is alert and oriented to person, place, and time. Mental status is at baseline.  Psychiatric:        Mood and Affect: Mood normal.        Behavior: Behavior normal.        Thought Content: Thought content normal.        Judgment: Judgment normal.     Results for orders placed or performed in visit on 01/18/23  Bayer DCA Hb A1c Waived  Result Value Ref Range   HB A1C (BAYER DCA - WAIVED) 9.8 (H) 4.8 - 5.6 %      Assessment & Plan:   Problem List Items Addressed This Visit       Cardiovascular and Mediastinum   Atrial fibrillation, chronic (HCC)    Stable. Continue metoprolol and eliquis. Call with any concerns.       Relevant Medications   atorvastatin (LIPITOR) 20 MG tablet   benazepril (LOTENSIN) 20 MG tablet   doxazosin (CARDURA) 2 MG tablet   furosemide (LASIX) 20 MG tablet   metoprolol tartrate (LOPRESSOR) 100 MG tablet   rivaroxaban (XARELTO) 20 MG TABS tablet     Endocrine   Diabetes mellitus ([place]) - Primary    Not under good control with A1c of 9.8. Does not want to change medicine at this time. Will get him to really work on diet and exercise for next 3 months, if not significantly better next time, will need to add medicine.       Relevant Medications   atorvastatin (LIPITOR) 20 MG tablet   benazepril (LOTENSIN) 20 MG tablet   metFORMIN (GLUCOPHAGE-XR) 500 MG 24 hr tablet   Other Relevant Orders   Bayer DCA Hb A1c Waived (Completed)   CBC with  Differential/Platelet   Comprehensive metabolic panel   Hypogonadism in male    Rechecking labs today. Await results.       Relevant Orders   CBC with Differential/Platelet   Comprehensive metabolic panel   Testosterone, free, total([facility])   PSA     Genitourinary   Hypertensive renal disease    Running a little high, but has been doing well at home. Will really work on his diet for the next 3 months. If still running high may need to adjust medication. Call with any concerns. Labs and refills done today.  Relevant Orders   CBC with Differential/Platelet   Comprehensive metabolic panel   BPH (benign prostatic hyperplasia)    Under good control on current regimen. Continue current regimen. Continue to monitor. Call with any concerns. Refills given. Labs drawn today.        Relevant Orders   CBC with Differential/Platelet   Comprehensive metabolic panel   PSA   Stage 3a chronic kidney disease ([place])    Rechecking labs today. Await results.      Relevant Orders   CBC with Differential/Platelet   Comprehensive metabolic panel     Other   Mixed hyperlipidemia    Under good control on current regimen. Continue current regimen. Continue to monitor. Call with any concerns. Refills given. Labs drawn today       Relevant Medications   atorvastatin (LIPITOR) 20 MG tablet   benazepril (LOTENSIN) 20 MG tablet   doxazosin (CARDURA) 2 MG tablet   furosemide (LASIX) 20 MG tablet   metoprolol tartrate (LOPRESSOR) 100 MG tablet   rivaroxaban (XARELTO) 20 MG TABS tablet   Other Relevant Orders   CBC with Differential/Platelet   Comprehensive metabolic panel   Lipid Panel w/o Chol/HDL Ratio   Elevated hemoglobin (HCC)    Rechecking labs today. Await results. Treat as needed.       Relevant Orders   CBC with Differential/Platelet   Comprehensive metabolic panel     Follow up plan: Return in about 3 months (around 04/20/2023).

## 2023-01-18 NOTE — Assessment & Plan Note (Signed)
Rechecking labs today. Await results. Treat as needed.  °

## 2023-01-18 NOTE — Assessment & Plan Note (Signed)
Running a little high, but has been doing well at home. Will really work on his diet for the next 3 months. If still running high may need to adjust medication. Call with any concerns. Labs and refills done today.

## 2023-01-18 NOTE — Assessment & Plan Note (Signed)
Stable. Continue metoprolol and eliquis. Call with any concerns.

## 2023-01-18 NOTE — Assessment & Plan Note (Signed)
Rechecking labs today. Await results.  

## 2023-01-18 NOTE — Assessment & Plan Note (Signed)
Not under good control with A1c of 9.8. Does not want to change medicine at this time. Will get him to really work on diet and exercise for next 3 months, if not significantly better next time, will need to add medicine.

## 2023-01-20 LAB — CBC WITH DIFFERENTIAL/PLATELET
Basophils Absolute: 0 10*3/uL (ref 0.0–0.2)
Basos: 1 %
EOS (ABSOLUTE): 0.1 10*3/uL (ref 0.0–0.4)
Eos: 1 %
Hematocrit: 50.4 % (ref 37.5–51.0)
Hemoglobin: 16.1 g/dL (ref 13.0–17.7)
Immature Grans (Abs): 0.1 10*3/uL (ref 0.0–0.1)
Immature Granulocytes: 1 %
Lymphocytes Absolute: 0.6 10*3/uL — ABNORMAL LOW (ref 0.7–3.1)
Lymphs: 11 %
MCH: 29.7 pg (ref 26.6–33.0)
MCHC: 31.9 g/dL (ref 31.5–35.7)
MCV: 93 fL (ref 79–97)
Monocytes Absolute: 0.4 10*3/uL (ref 0.1–0.9)
Monocytes: 7 %
Neutrophils Absolute: 4.3 10*3/uL (ref 1.4–7.0)
Neutrophils: 79 %
Platelets: 166 10*3/uL (ref 150–450)
RBC: 5.43 x10E6/uL (ref 4.14–5.80)
RDW: 12.3 % (ref 11.6–15.4)
WBC: 5.4 10*3/uL (ref 3.4–10.8)

## 2023-01-20 LAB — LIPID PANEL W/O CHOL/HDL RATIO
Cholesterol, Total: 139 mg/dL (ref 100–199)
HDL: 41 mg/dL (ref >39)
LDL Chol Calc (NIH): 81 mg/dL (ref 0–99)
Triglycerides: 89 mg/dL (ref 0–149)
VLDL Cholesterol Cal: 17 mg/dL (ref 5–40)

## 2023-01-20 LAB — COMPREHENSIVE METABOLIC PANEL
ALT: 18 IU/L (ref 0–44)
AST: 17 IU/L (ref 0–40)
Albumin/Globulin Ratio: 2.3 — ABNORMAL HIGH (ref 1.2–2.2)
Albumin: 4.5 g/dL (ref 3.8–4.8)
Alkaline Phosphatase: 82 IU/L (ref 44–121)
BUN/Creatinine Ratio: 14 (ref 10–24)
BUN: 19 mg/dL (ref 8–27)
Bilirubin Total: 0.6 mg/dL (ref 0.0–1.2)
CO2: 22 mmol/L (ref 20–29)
Calcium: 9.4 mg/dL (ref 8.6–10.2)
Chloride: 100 mmol/L (ref 96–106)
Creatinine, Ser: 1.37 mg/dL — ABNORMAL HIGH (ref 0.76–1.27)
Globulin, Total: 2 g/dL (ref 1.5–4.5)
Glucose: 298 mg/dL — ABNORMAL HIGH (ref 70–99)
Potassium: 4.7 mmol/L (ref 3.5–5.2)
Sodium: 139 mmol/L (ref 134–144)
Total Protein: 6.5 g/dL (ref 6.0–8.5)
eGFR: 55 mL/min/{1.73_m2} — ABNORMAL LOW (ref >59)

## 2023-01-20 LAB — TESTOSTERONE, FREE, TOTAL, SHBG
Sex Hormone Binding: 32.1 nmol/L (ref 19.3–76.4)
Testosterone, Free: 5 pg/mL — ABNORMAL LOW (ref 6.6–18.1)
Testosterone: 279 ng/dL (ref 264–916)

## 2023-01-20 LAB — PSA: Prostate Specific Ag, Serum: 1.6 ng/mL (ref 0.0–4.0)

## 2023-01-31 ENCOUNTER — Ambulatory Visit (INDEPENDENT_AMBULATORY_CARE_PROVIDER_SITE_OTHER): Payer: Medicare HMO

## 2023-01-31 VITALS — Ht 70.0 in | Wt 280.0 lb

## 2023-01-31 DIAGNOSIS — Z0000 Encounter for general adult medical examination without abnormal findings: Secondary | ICD-10-CM | POA: Diagnosis not present

## 2023-01-31 NOTE — Patient Instructions (Signed)
Jeff [name] , Thank you for taking time to come for your Medicare Wellness Visit. I appreciate your ongoing commitment to your health goals. Please review the following plan we discussed and let me know if I can assist you in the future.   These are the goals we discussed:  Goals      DIET - EAT MORE FRUITS AND VEGETABLES     DIET - INCREASE WATER INTAKE     Recommend drinking at least 6-8 glasses of water a day      Patient Stated     11/24/2020, stay active     Patient Stated     Maintain weight loss        This is a list of the screening recommended for you and due dates:  Health Maintenance  Topic Date Due   Zoster (Shingles) Vaccine (1 of 2) Never done   Eye exam for diabetics  06/22/2022   COVID-19 Vaccine (4 - 2023-24 season) 07/09/2022   Hemoglobin A1C  07/21/2023   Yearly kidney health urinalysis for diabetes  08/17/2023   Complete foot exam   08/17/2023   Yearly kidney function blood test for diabetes  01/18/2024   Medicare Annual Wellness Visit  01/31/2024   Colon Cancer Screening  05/28/2026   DTaP/Tdap/Td vaccine (2 - Tdap) 03/13/2030   Pneumonia Vaccine  Completed   Flu Shot  Completed   Hepatitis C Screening: USPSTF Recommendation to screen - Ages 18-79 yo.  Completed   HPV Vaccine  Aged Out    Advanced directives: yes  Conditions/risks identified: no  Next appointment: Follow up in one year for your annual wellness visit. 02/06/24 @ 8:15 am by phone  Preventive Care 65 Years and Older, Male  Preventive care refers to lifestyle choices and visits with your health care provider that can promote health and wellness. What does preventive care include? A yearly physical exam. This is also called an annual well check. Dental exams once or twice a year. Routine eye exams. Ask your health care provider how often you should have your eyes checked. Personal lifestyle choices, including: Daily care of your teeth and gums. Regular physical activity. Eating a  healthy diet. Avoiding tobacco and drug use. Limiting alcohol use. Practicing safe sex. Taking low doses of aspirin every day. Taking vitamin and mineral supplements as recommended by your health care provider. What happens during an annual well check? The services and screenings done by your health care provider during your annual well check will depend on your age, overall health, lifestyle risk factors, and family history of disease. Counseling  Your health care provider may ask you questions about your: Alcohol use. Tobacco use. Drug use. Emotional well-being. Home and relationship well-being. Sexual activity. Eating habits. History of falls. Memory and ability to understand (cognition). Work and work Statistician. Screening  You may have the following tests or measurements: Height, weight, and BMI. Blood pressure. Lipid and cholesterol levels. These may be checked every 5 years, or more frequently if you are over 44 years old. Skin check. Lung cancer screening. You may have this screening every year starting at age 68 if you have a 30-pack-year history of smoking and currently smoke or have quit within the past 15 years. Fecal occult blood test (FOBT) of the stool. You may have this test every year starting at age 88. Flexible sigmoidoscopy or colonoscopy. You may have a sigmoidoscopy every 5 years or a colonoscopy every 10 years starting at age 58. Prostate cancer  screening. Recommendations will vary depending on your family history and other risks. Hepatitis C blood test. Hepatitis B blood test. Sexually transmitted disease (STD) testing. Diabetes screening. This is done by checking your blood sugar (glucose) after you have not eaten for a while (fasting). You may have this done every 1-3 years. Abdominal aortic aneurysm (AAA) screening. You may need this if you are a current or former smoker. Osteoporosis. You may be screened starting at age 31 if you are at high risk. Talk  with your health care provider about your test results, treatment options, and if necessary, the need for more tests. Vaccines  Your health care provider may recommend certain vaccines, such as: Influenza vaccine. This is recommended every year. Tetanus, diphtheria, and acellular pertussis (Tdap, Td) vaccine. You may need a Td booster every 10 years. Zoster vaccine. You may need this after age 46. Pneumococcal 13-valent conjugate (PCV13) vaccine. One dose is recommended after age 56. Pneumococcal polysaccharide (PPSV23) vaccine. One dose is recommended after age 22. Talk to your health care provider about which screenings and vaccines you need and how often you need them. This information is not intended to replace advice given to you by your health care provider. Make sure you discuss any questions you have with your health care provider. Document Released: 11/21/2015 Document Revised: 07/14/2016 Document Reviewed: 08/26/2015 Elsevier Interactive Patient Education  2017 [place] Prevention in the Home Falls can cause injuries. They can happen to people of all ages. There are many things you can do to make your home safe and to help prevent falls. What can I do on the outside of my home? Regularly fix the edges of walkways and driveways and fix any cracks. Remove anything that might make you trip as you walk through a door, such as a raised step or threshold. Trim any bushes or trees on the path to your home. Use bright outdoor lighting. Clear any walking paths of anything that might make someone trip, such as rocks or tools. Regularly check to see if handrails are loose or broken. Make sure that both sides of any steps have handrails. Any raised decks and porches should have guardrails on the edges. Have any leaves, snow, or ice cleared regularly. Use sand or salt on walking paths during winter. Clean up any spills in your garage right away. This includes oil or grease  spills. What can I do in the bathroom? Use night lights. Install grab bars by the toilet and in the tub and shower. Do not use towel bars as grab bars. Use non-skid mats or decals in the tub or shower. If you need to sit down in the shower, use a plastic, non-slip stool. Keep the floor dry. Clean up any water that spills on the floor as soon as it happens. Remove soap buildup in the tub or shower regularly. Attach bath mats securely with double-sided non-slip rug tape. Do not have throw rugs and other things on the floor that can make you trip. What can I do in the bedroom? Use night lights. Make sure that you have a light by your bed that is easy to reach. Do not use any sheets or blankets that are too big for your bed. They should not hang down onto the floor. Have a firm chair that has side arms. You can use this for support while you get dressed. Do not have throw rugs and other things on the floor that can make you trip. What can  I do in the kitchen? Clean up any spills right away. Avoid walking on wet floors. Keep items that you use a lot in easy-to-reach places. If you need to reach something above you, use a strong step stool that has a grab bar. Keep electrical cords out of the way. Do not use floor polish or wax that makes floors slippery. If you must use wax, use non-skid floor wax. Do not have throw rugs and other things on the floor that can make you trip. What can I do with my stairs? Do not leave any items on the stairs. Make sure that there are handrails on both sides of the stairs and use them. Fix handrails that are broken or loose. Make sure that handrails are as long as the stairways. Check any carpeting to make sure that it is firmly attached to the stairs. Fix any carpet that is loose or worn. Avoid having throw rugs at the top or bottom of the stairs. If you do have throw rugs, attach them to the floor with carpet tape. Make sure that you have a light switch at the  top of the stairs and the bottom of the stairs. If you do not have them, ask someone to add them for you. What else can I do to help prevent falls? Wear shoes that: Do not have high heels. Have rubber bottoms. Are comfortable and fit you well. Are closed at the toe. Do not wear sandals. If you use a stepladder: Make sure that it is fully opened. Do not climb a closed stepladder. Make sure that both sides of the stepladder are locked into place. Ask someone to hold it for you, if possible. Clearly mark and make sure that you can see: Any grab bars or handrails. First and last steps. Where the edge of each step is. Use tools that help you move around (mobility aids) if they are needed. These include: Canes. Walkers. Scooters. Crutches. Turn on the lights when you go into a dark area. Replace any light bulbs as soon as they burn out. Set up your furniture so you have a clear path. Avoid moving your furniture around. If any of your floors are uneven, fix them. If there are any pets around you, be aware of where they are. Review your medicines with your doctor. Some medicines can make you feel dizzy. This can increase your chance of falling. Ask your doctor what other things that you can do to help prevent falls. This information is not intended to replace advice given to you by your health care provider. Make sure you discuss any questions you have with your health care provider. Document Released: 08/21/2009 Document Revised: 04/01/2016 Document Reviewed: 11/29/2014 Elsevier Interactive Patient Education  2017 [employer].

## 2023-01-31 NOTE — Progress Notes (Signed)
I connected with  Jeff [name]. on 01/31/23 by a audio enabled telemedicine application and verified that I am speaking with the correct person using two identifiers.  Patient Location: Home  Provider Location: Office/Clinic  I discussed the limitations of evaluation and management by telemedicine. The patient expressed understanding and agreed to proceed.  Subjective:   Jeff [name]. is a 72 y.o. male who presents for Medicare Annual/Subsequent preventive examination.  Review of Systems     Cardiac Risk Factors include: advanced age (>21men, >5 women);diabetes mellitus;male gender;hypertension     Objective:    There were no vitals filed for this visit. There is no height or weight on file to calculate BMI.     01/31/2023    8:21 AM 01/21/2022    8:22 AM 08/20/2021    1:14 PM 08/19/2021    4:59 PM 04/06/2021    4:10 PM 11/24/2020    9:02 AM 10/06/2019    2:49 PM  Advanced Directives  Does Patient Have a Medical Advance Directive? Yes No;Yes Yes Yes No Yes Yes  Type of Paramedic of [place];Living will Healthcare Power of [place];Living will [place];Living will  Does patient want to make changes to medical advance directive? No - Patient declined  Yes (Inpatient - patient requests chaplain consult to change a medical advance directive)    No - Patient declined  Copy of [place] in Chart? Yes - validated most recent copy scanned in chart (See row information) Yes - validated most recent copy scanned in chart (See row information)    No - copy requested No - copy requested  Would patient like information on creating a medical advance directive?     No - Patient declined      Current Medications (verified) Outpatient Encounter Medications as of 01/31/2023  Medication Sig   albuterol (VENTOLIN HFA) 108 (90 Base) MCG/ACT inhaler Inhale 2 puffs  into the lungs every 4 (four) hours as needed for wheezing or shortness of breath.   Aspirin 325 MG CAPS Take by mouth.   atorvastatin (LIPITOR) 20 MG tablet Take 0.5 tablets (10 mg total) by mouth daily.   benazepril (LOTENSIN) 20 MG tablet Take 1 tablet (20 mg total) by mouth daily.   doxazosin (CARDURA) 2 MG tablet Take 1 tablet (2 mg total) by mouth 2 (two) times daily.   fluticasone (FLONASE) 50 MCG/ACT nasal spray Place 2 sprays into both nostrils daily as needed for allergies or rhinitis.   furosemide (LASIX) 20 MG tablet Take 1 tablet (20 mg total) by mouth daily.   metFORMIN (GLUCOPHAGE-XR) 500 MG 24 hr tablet Take 2 tablets (1,000 mg total) by mouth 2 (two) times daily as needed.   metoprolol tartrate (LOPRESSOR) 100 MG tablet Take 1 tablet (100 mg total) by mouth 2 (two) times daily.   rivaroxaban (XARELTO) 20 MG TABS tablet Take 1 tablet (20 mg total) by mouth daily.   gabapentin (NEURONTIN) 100 MG capsule Take 1 capsule (100 mg total) by mouth 3 (three) times daily. (Patient not taking: Reported on 01/31/2023)   No facility-administered encounter medications on file as of 01/31/2023.    Allergies (verified) Patient has no known allergies.   History: Past Medical History:  Diagnosis Date   Chronic deep vein thrombosis (DVT) (HCC)    Chronic low back pain    Chronic systolic heart failure ([facility])    a.  TTE 2011 with EF 40-45% per notes   Diabetes mellitus without complication [facility])    Erectile dysfunction    HLD (hyperlipidemia)    Hypertension    Hypogonadism in male    Permanent atrial fibrillation ([facility])    a. s/p DCCV 2011; b. redeveloped Afib in 2016; c. CHADS2VASc at least 5 (CHF, HTN, age x 1, DM, vascular disease); d. on Xarelto   Torn meniscus    right knee   Past Surgical History:  Procedure Laterality Date   COLONOSCOPY WITH PROPOFOL N/A 05/28/2016   Procedure: COLONOSCOPY WITH PROPOFOL;  Surgeon: Lucilla Lame, MD;  Location: [facility];  Service:  Endoscopy;  Laterality: N/A;  diabetic - oral meds   DVT, leg Left    INSERTION OF VENA CAVA FILTER  2014   KNEE ARTHROSCOPY WITH MEDIAL MENISECTOMY Right 05/31/2019   Procedure: KNEE ARTHROSCOPY WITH partial  MEDIAL MENISECTOMY;  Surgeon: Leim Fabry, MD;  Location: [facility];  Service: Orthopedics;  Laterality: Right;   LOWER EXTREMITY VENOGRAPHY Left 08/21/2021   Procedure: LOWER EXTREMITY VENOGRAPHY;  Surgeon: Katha Cabal, MD;  Location: [facility];  Service: Cardiovascular;  Laterality: Left;  Possible IVC Filter Insertion   PERIPHERAL VASCULAR THROMBECTOMY Left 12/01/2017   Procedure: PERIPHERAL VASCULAR THROMBECTOMY;  Surgeon: Katha Cabal, MD;  Location: [facility];  Service: Cardiovascular;  Laterality: Left;   Family History  Problem Relation Age of Onset   Lymphoma Mother    Heart failure Father    Diabetes Father    Atrial fibrillation Brother    Diabetes Mellitus II Other    Heart attack Neg Hx    Hypertension Neg Hx    Cancer Neg Hx    COPD Neg Hx    Stroke Neg Hx    Prostate cancer Neg Hx    Kidney cancer Neg Hx    Bladder Cancer Neg Hx    Social History   Socioeconomic History   Marital status: Single    Spouse name: Not on file   Number of children: Not on file   Years of education: Not on file   Highest education level: Some college, no degree  Occupational History   Occupation: [employer]   Tobacco Use   Smoking status: Never   Smokeless tobacco: Never  Vaping Use   Vaping Use: Never used  Substance and Sexual Activity   Alcohol use: No    Alcohol/week: 0.0 standard drinks of alcohol   Drug use: No   Sexual activity: Yes  Other Topics Concern   Not on file  Social History Narrative   Former Engineer, structural    Independent at baseline. [employer]   Works at [employer] Strain: Low Risk  (01/31/2023)   Overall Financial Resource Strain (CARDIA)     Difficulty of Paying Living Expenses: Not hard at all  Food Insecurity: No Food Insecurity (01/31/2023)   Hunger Vital Sign    Worried About Running Out of Food in the Last Year: Never true    [place] in the Last Year: Never true  Transportation Needs: No Transportation Needs (01/31/2023)   PRAPARE - Hydrologist (Medical): No    Lack of Transportation (Non-Medical): No  Physical Activity: Sufficiently Active (01/31/2023)   Exercise Vital Sign    Days of Exercise per Week: 4 days    Minutes of Exercise  per Session: 60 min  Stress: No Stress Concern Present (01/31/2023)   [place]    Feeling of Stress : Not at all  Social Connections: [place] (01/31/2023)   Social Connection and Isolation Panel [NHANES]    Frequency of Communication with Friends and Family: More than three times a week    Frequency of Social Gatherings with Friends and Family: More than three times a week    Attends Religious Services: More than 4 times per year    Active Member of [employer] or Organizations: Yes    Attends Music therapist: More than 4 times per year    Marital Status: Living with partner    Tobacco Counseling Counseling given: Not Answered   Clinical Intake:  Pre-visit preparation completed: Yes  Pain : No/denies pain     Nutritional Risks: None Diabetes: Yes CBG done?: No Did pt. bring in CBG monitor from home?: No  How often do you need to have someone help you when you read instructions, pamphlets, or other written materials from your doctor or pharmacy?: 1 - Never  Diabetic?yes Nutrition Risk Assessment:  Has the patient had any N/V/D within the last 2 months?  No  Does the patient have any non-healing wounds?  No  Has the patient had any unintentional weight loss or weight gain?  No   Diabetes:  Is the patient diabetic?  Yes  If diabetic, was a CBG  obtained today?  No  Did the patient bring in their glucometer from home?  No  How often do you monitor your CBG's? Every other day.   Financial Strains and Diabetes Management:  Are you having any financial strains with the device, your supplies or your medication? No .  Does the patient want to be seen by Chronic Care Management for management of their diabetes?  No  Would the patient like to be referred to a Nutritionist or for Diabetic Management?  No   Diabetic Exams:  Diabetic Eye Exam: Completed 06/22/21. Overdue for diabetic eye exam. Pt has been advised about the importance in completing this exam.  Diabetic Foot Exam: Completed 08/16/22. Pt has been advised about the importance in completing this exam.   Interpreter Needed?: No  Information entered by :: Kirke Shaggy, LPN   Activities of Daily Living    01/31/2023    8:22 AM 08/16/2022    9:25 AM  In your present state of health, do you have any difficulty performing the following activities:  Hearing? 0 0  Vision? 0 0  Difficulty concentrating or making decisions? 0 0  Walking or climbing stairs? 0 0  Dressing or bathing? 0 0  Doing errands, shopping? 0 0  Preparing Food and eating ? N   Using the Toilet? N   In the past six months, have you accidently leaked urine? N   Do you have problems with loss of bowel control? N   Managing your Medications? N   Managing your Finances? N   Housekeeping or managing your Housekeeping? N     Patient Care Team: Valerie Roys, DO as PCP - General (Family Medicine) Minna Merritts, MD as Consulting Physician (Cardiology) Carloyn Manner, MD as Referring Physician (Otolaryngology) Julien Nordmann, MD as Referring Physician (Diagnostic Radiology)  Indicate any recent Medical Services you may have received from other than [facility] providers in the past year (date may be approximate).     Assessment:   This is  a routine wellness examination for Charley.  Hearing/Vision  screen Hearing Screening - Comments:: No aids Vision Screening - Comments:: Readers- [place] issues and exercise activities discussed: Current Exercise Habits: Home exercise routine, Type of exercise: walking, Time (Minutes): 60, Frequency (Times/Week): 4, Weekly Exercise (Minutes/Week): 240, Intensity: Moderate   Goals Addressed             This Visit's Progress    DIET - EAT MORE FRUITS AND VEGETABLES         Depression Screen    01/31/2023    8:19 AM 01/18/2023   10:29 AM 08/16/2022    9:23 AM 07/20/2022   11:35 AM 03/29/2022    2:32 PM 01/21/2022    8:36 AM 08/31/2021    8:37 AM  PHQ 2/9 Scores  PHQ - 2 Score 0 0 0 0 0 0 0  PHQ- 9 Score 0 0 0 1 0  0    Fall Risk    01/31/2023    8:22 AM 01/18/2023   10:29 AM 08/16/2022    9:23 AM 07/20/2022   11:29 AM 01/21/2022    8:22 AM  Fall Risk   Falls in the past year? 0 0 0 1 0  Number falls in past yr: 0 0 0 0 0  Injury with Fall? 0 0 0 0 0  Risk for fall due to : No Fall Risks No Fall Risks No Fall Risks History of fall(s)   Follow up Falls prevention discussed;Falls evaluation completed Falls evaluation completed Falls evaluation completed Falls evaluation completed Education provided;Falls evaluation completed;Falls prevention discussed    FALL RISK PREVENTION PERTAINING TO THE HOME:  Any stairs in or around the home? Yes  If so, are there any without handrails? No  Home free of loose throw rugs in walkways, pet beds, electrical cords, etc? Yes  Adequate lighting in your home to reduce risk of falls? Yes   ASSISTIVE DEVICES UTILIZED TO PREVENT FALLS:  Life alert? No  Use of a cane, walker or w/c? No  Grab bars in the bathroom? Yes  Shower chair or bench in shower? Yes  Elevated toilet seat or a handicapped toilet? No    Cognitive Function:        01/31/2023    8:26 AM 11/24/2020    9:05 AM  6CIT Screen  What Year? 0 points 0 points  What month? 0 points 0 points  What time? 0 points 0 points   Count back from 20 0 points 0 points  Months in reverse 0 points 0 points  Repeat phrase 0 points 0 points  Total Score 0 points 0 points    Immunizations Immunization History  Administered Date(s) Administered   Fluad Quad(high Dose 65+) 08/31/2021, 08/16/2022   Influenza, High Dose Seasonal PF 10/04/2016, 09/07/2017, 09/13/2018, 08/20/2019   Influenza,inj,Quad PF,6+ Mos 09/01/2015   Moderna Sars-Covid-2 Vaccination 08/08/2020, 09/04/2020, 06/08/2021   Pneumococcal Conjugate-13 10/04/2016   Pneumococcal Polysaccharide-23 07/30/2009, 02/23/2018   Td 03/13/2020   Zoster, Live 03/18/2014    TDAP status: Up to date  Flu Vaccine status: Up to date  Pneumococcal vaccine status: Up to date  Covid-19 vaccine status: Completed vaccines  Qualifies for Shingles Vaccine? Yes   Zostavax completed Yes   Shingrix Completed?: No.    Education has been provided regarding the importance of this vaccine. Patient has been advised to call insurance company to determine out of pocket expense if they have not yet received this vaccine. Advised may  also receive vaccine at local pharmacy or Health Dept. Verbalized acceptance and understanding.  Screening Tests Health Maintenance  Topic Date Due   Zoster Vaccines- Shingrix (1 of 2) Never done   OPHTHALMOLOGY EXAM  06/22/2022   COVID-19 Vaccine (4 - 2023-24 season) 07/09/2022   HEMOGLOBIN A1C  07/21/2023   Diabetic kidney evaluation - Urine ACR  08/17/2023   FOOT EXAM  08/17/2023   Diabetic kidney evaluation - eGFR measurement  01/18/2024   Medicare Annual Wellness (AWV)  01/31/2024   COLONOSCOPY (Pts 45-74yrs Insurance coverage will need to be confirmed)  05/28/2026   DTaP/Tdap/Td (2 - Tdap) 03/13/2030   Pneumonia Vaccine 59+ Years old  Completed   INFLUENZA VACCINE  Completed   Hepatitis C Screening  Completed   HPV VACCINES  Aged Out    Health Maintenance  Health Maintenance Due  Topic Date Due   Zoster Vaccines- Shingrix (1 of 2)  Never done   OPHTHALMOLOGY EXAM  06/22/2022   COVID-19 Vaccine (4 - 2023-24 season) 07/09/2022    Colorectal cancer screening: Type of screening: Colonoscopy. Completed 05/28/16. Repeat every 10 years  Lung Cancer Screening: (Low Dose CT Chest recommended if Age 36-80 years, 30 pack-year currently smoking OR have quit w/in 15years.) does not qualify.   Additional Screening:  Hepatitis C Screening: does qualify; Completed 02/23/18  Vision Screening: Recommended annual ophthalmology exams for early detection of glaucoma and other disorders of the eye. Is the patient up to date with their annual eye exam?  Yes  Who is the provider or what is the name of the office in which the patient attends annual eye exams? [facility] If pt is not established with a provider, would they like to be referred to a provider to establish care? No .   Dental Screening: Recommended annual dental exams for proper oral hygiene  Community Resource Referral / Chronic Care Management: CRR required this visit?  No   CCM required this visit?  No      Plan:     I have personally reviewed and noted the following in the patient's chart:   Medical and social history Use of alcohol, tobacco or illicit drugs  Current medications and supplements including opioid prescriptions. Patient is not currently taking opioid prescriptions. Functional ability and status Nutritional status Physical activity Advanced directives List of other physicians Hospitalizations, surgeries, and ER visits in previous 12 months Vitals Screenings to include cognitive, depression, and falls Referrals and appointments  In addition, I have reviewed and discussed with patient certain preventive protocols, quality metrics, and best practice recommendations. A written personalized care plan for preventive services as well as general preventive health recommendations were provided to patient.     Dionisio David, LPN   [id number]    Nurse Notes: none

## 2023-03-15 DIAGNOSIS — H25813 Combined forms of age-related cataract, bilateral: Secondary | ICD-10-CM | POA: Diagnosis not present

## 2023-03-15 DIAGNOSIS — H01024 Squamous blepharitis left upper eyelid: Secondary | ICD-10-CM | POA: Diagnosis not present

## 2023-03-15 DIAGNOSIS — H40023 Open angle with borderline findings, high risk, bilateral: Secondary | ICD-10-CM | POA: Diagnosis not present

## 2023-03-15 DIAGNOSIS — E113293 Type 2 diabetes mellitus with mild nonproliferative diabetic retinopathy without macular edema, bilateral: Secondary | ICD-10-CM | POA: Diagnosis not present

## 2023-03-15 DIAGNOSIS — I10 Essential (primary) hypertension: Secondary | ICD-10-CM | POA: Diagnosis not present

## 2023-03-15 DIAGNOSIS — H524 Presbyopia: Secondary | ICD-10-CM | POA: Diagnosis not present

## 2023-03-15 DIAGNOSIS — H35033 Hypertensive retinopathy, bilateral: Secondary | ICD-10-CM | POA: Diagnosis not present

## 2023-03-15 DIAGNOSIS — H01021 Squamous blepharitis right upper eyelid: Secondary | ICD-10-CM | POA: Diagnosis not present

## 2023-03-15 LAB — HM DIABETES EYE EXAM

## 2023-04-19 ENCOUNTER — Ambulatory Visit (INDEPENDENT_AMBULATORY_CARE_PROVIDER_SITE_OTHER): Payer: Medicare HMO | Admitting: Family Medicine

## 2023-04-19 ENCOUNTER — Encounter: Payer: Self-pay | Admitting: Family Medicine

## 2023-04-19 VITALS — BP 98/63 | HR 69 | Temp 97.5°F | Ht 70.0 in | Wt 252.4 lb

## 2023-04-19 DIAGNOSIS — I952 Hypotension due to drugs: Secondary | ICD-10-CM

## 2023-04-19 DIAGNOSIS — Z7984 Long term (current) use of oral hypoglycemic drugs: Secondary | ICD-10-CM | POA: Diagnosis not present

## 2023-04-19 DIAGNOSIS — E1159 Type 2 diabetes mellitus with other circulatory complications: Secondary | ICD-10-CM

## 2023-04-19 LAB — BAYER DCA HB A1C WAIVED: HB A1C (BAYER DCA - WAIVED): 8 % — ABNORMAL HIGH (ref 4.8–5.6)

## 2023-04-19 MED ORDER — METOPROLOL TARTRATE 100 MG PO TABS: 50.0000 mg | ORAL_TABLET | Freq: Two times a day (BID) | ORAL | 1 refills | Status: DC

## 2023-04-19 NOTE — Assessment & Plan Note (Signed)
Congratulated patient on nearly 30lbs of weight loss. A1c significantly improved from 9.8 to 8.0. Continue current regimen. Continue to monitor. Recheck 3 months.

## 2023-04-19 NOTE — Progress Notes (Signed)
BP 98/63   Pulse 69   Temp (!) 97.5 F (36.4 C) (Oral)   Ht 5\' 10"  (1.778 m)   Wt 252 lb 6.4 oz (114.5 kg)   SpO2 96%   BMI 36.22 kg/m    Subjective:    Patient ID: Jeff Mealing., male    DOB: [birth year], 72 y.o.   MRN: [id number]  HPI: Jeff Spanos. is a 72 y.o. male  Chief Complaint  Patient presents with   Diabetes   DIABETES Hypoglycemic episodes:no Polydipsia/polyuria: no Visual disturbance: no Chest pain: no Paresthesias: no Glucose Monitoring: no Taking Insulin?: no Blood Pressure Monitoring: not checking Retinal Examination: Up to Date Foot Exam: Up to Date Diabetic Education: Completed Pneumovax: Up to Date Influenza: Up to Date Aspirin: yes  HYPOTENSION  Hypertension status: controlled  Satisfied with current treatment? yes Duration of hypertension: chronic BP monitoring frequency:  not checking BP medication side effects:  no Medication compliance: excellent compliance Previous BP meds:lasix, cardura, benazepril, metoprolol Aspirin: yes Recurrent headaches: no Visual changes: no Palpitations: no Dyspnea: no Chest pain: no Lower extremity edema: no Dizzy/lightheaded: yes   Relevant past medical, surgical, family and social history reviewed and updated as indicated. Interim medical history since our last visit reviewed. Allergies and medications reviewed and updated.  Review of Systems  Constitutional: Negative.   Respiratory: Negative.    Cardiovascular: Negative.   Gastrointestinal: Negative.   Musculoskeletal: Negative.   Neurological: Negative.   Psychiatric/Behavioral: Negative.      Per HPI unless specifically indicated above     Objective:    BP 98/63   Pulse 69   Temp (!) 97.5 F (36.4 C) (Oral)   Ht 5\' 10"  (1.778 m)   Wt 252 lb 6.4 oz (114.5 kg)   SpO2 96%   BMI 36.22 kg/m   Wt Readings from Last 3 Encounters:  04/19/23 252 lb 6.4 oz (114.5 kg)  01/31/23 280 lb (127 kg)  01/18/23 280 lb 8 oz (127.2  kg)    Physical Exam Vitals and nursing note reviewed.  Constitutional:      General: He is not in acute distress.    Appearance: Normal appearance. He is not ill-appearing, toxic-appearing or diaphoretic.  HENT:     Head: Normocephalic and atraumatic.     Right Ear: External ear normal.     Left Ear: External ear normal.     Nose: Nose normal.     Mouth/Throat:     Mouth: Mucous membranes are moist.     Pharynx: Oropharynx is clear.  Eyes:     General: No scleral icterus.       Right eye: No discharge.        Left eye: No discharge.     Extraocular Movements: Extraocular movements intact.     Conjunctiva/sclera: Conjunctivae normal.     Pupils: Pupils are equal, round, and reactive to light.  Cardiovascular:     Rate and Rhythm: Normal rate and regular rhythm.     Pulses: Normal pulses.     Heart sounds: Normal heart sounds. No murmur heard.    No friction rub. No gallop.  Pulmonary:     Effort: Pulmonary effort is normal. No respiratory distress.     Breath sounds: Normal breath sounds. No stridor. No wheezing, rhonchi or rales.  Chest:     Chest wall: No tenderness.  Musculoskeletal:        General: Normal range of motion.     Cervical  back: Normal range of motion and neck supple.  Skin:    General: Skin is warm and dry.     Capillary Refill: Capillary refill takes less than 2 seconds.     Coloration: Skin is not jaundiced or pale.     Findings: No bruising, erythema, lesion or rash.  Neurological:     General: No focal deficit present.     Mental Status: He is alert and oriented to person, place, and time. Mental status is at baseline.  Psychiatric:        Mood and Affect: Mood normal.        Behavior: Behavior normal.        Thought Content: Thought content normal.        Judgment: Judgment normal.     Results for orders placed or performed in visit on 03/28/23  HM DIABETES EYE EXAM  Result Value Ref Range   HM Diabetic Eye Exam Retinopathy (A) No  Retinopathy      Assessment & Plan:   Problem List Items Addressed This Visit       Endocrine   Diabetes mellitus (HCC) - Primary    Congratulated patient on nearly 30lbs of weight loss. A1c significantly improved from 9.8 to 8.0. Continue current regimen. Continue to monitor. Recheck 3 months.       Relevant Orders   Bayer DCA Hb A1c Waived   Other Visit Diagnoses     Hypotension due to drugs       Due to weight loss. Will cut his metoprolol to 50mg  BID and recheck 3 months. Call with any concerns. Continue to monitor.   Relevant Medications   metoprolol tartrate (LOPRESSOR) 100 MG tablet        Follow up plan: Return in about 3 months (around 07/20/2023).

## 2023-07-19 ENCOUNTER — Ambulatory Visit: Payer: Medicare HMO | Admitting: Family Medicine

## 2023-08-01 ENCOUNTER — Encounter: Payer: Self-pay | Admitting: Family Medicine

## 2023-08-01 ENCOUNTER — Ambulatory Visit: Payer: Self-pay | Admitting: *Deleted

## 2023-08-01 ENCOUNTER — Telehealth (INDEPENDENT_AMBULATORY_CARE_PROVIDER_SITE_OTHER): Payer: Medicare HMO | Admitting: Family Medicine

## 2023-08-01 ENCOUNTER — Telehealth (INDEPENDENT_AMBULATORY_CARE_PROVIDER_SITE_OTHER): Payer: Medicare HMO | Admitting: Physician Assistant

## 2023-08-01 DIAGNOSIS — U071 COVID-19: Secondary | ICD-10-CM

## 2023-08-01 DIAGNOSIS — Z753 Unavailability and inaccessibility of health-care facilities: Secondary | ICD-10-CM

## 2023-08-01 NOTE — Progress Notes (Signed)
Patient was not able to get on mychart. Will no-charge appt today

## 2023-08-01 NOTE — Telephone Encounter (Signed)
  Chief Complaint: covid positive at home test yesterday  Symptoms: cough productive yellow sputum. Sinus pain, body aches, fever yesterday normal today 98.7.  Frequency: Saturday  Pertinent Negatives: Patient denies chest pain no difficulty breathing no fever  Disposition: [] ED /[] Urgent Care (no appt availability in office) / [x] Appointment(In office/virtual)/ []  [facility]/ [] Home Care/ [] Refused Recommended Disposition /[] [facility]/ []  Follow-up with PCP Additional Notes:   My chart VV schedule with E. Mecum, PA.     Reason for Disposition  [1] HIGH RISK patient (e.g., weak immune system, age > 64 years, obesity with BMI 30 or higher, pregnant, chronic lung disease or other chronic medical condition) AND [2] COVID symptoms (e.g., cough, fever)  (Exceptions: Already seen by PCP and no new or worsening symptoms.)  Answer Assessment - Initial Assessment Questions 1. COVID-19 DIAGNOSIS: "How do you know that you have COVID?" (e.g., positive lab test or self-test, diagnosed by doctor or NP/PA, symptoms after exposure).     Covid positive at home test yesterday  2. COVID-19 EXPOSURE: "Was there any known exposure to COVID before the symptoms began?" CDC Definition of close contact: within 6 feet (2 meters) for a total of 15 minutes or more over a 24-hour period.      Na  3. ONSET: "When did the COVID-19 symptoms start?"      Saturday night  4. WORST SYMPTOM: "What is your worst symptom?" (e.g., cough, fever, shortness of breath, muscle aches)     Cough congestion, sinus pain, body aches, coughing up yellow sputum fever yesterday today 98.7 5. COUGH: "Do you have a cough?" If Yes, ask: "How bad is the cough?"       Yes  6. FEVER: "Do you have a fever?" If Yes, ask: "What is your temperature, how was it measured, and when did it start?"     Not today  7. RESPIRATORY STATUS: "Describe your breathing?" (e.g., normal; shortness of breath, wheezing, unable to speak)       na 8. BETTER-SAME-WORSE: "Are you getting better, staying the same or getting worse compared to yesterday?"  If getting worse, ask, "In what way?"     na 9. OTHER SYMPTOMS: "Do you have any other symptoms?"  (e.g., chills, fatigue, headache, loss of smell or taste, muscle pain, sore throat)     See above  10. HIGH RISK DISEASE: "Do you have any chronic medical problems?" (e.g., asthma, heart or lung disease, weak immune system, obesity, etc.)       Hx  11. VACCINE: "Have you had the COVID-19 vaccine?" If Yes, ask: "Which one, how many shots, when did you get it?"       na 12. PREGNANCY: "Is there any chance you are pregnant?" "When was your last menstrual period?"       na 13. O2 SATURATION MONITOR:  "Do you use an oxygen saturation monitor (pulse oximeter) at home?" If Yes, ask "What is your reading (oxygen level) today?" "What is your usual oxygen saturation reading?" (e.g., 95%)       na  Protocols used: Coronavirus (COVID-19) Diagnosed or Suspected-A-AH

## 2023-08-01 NOTE — Progress Notes (Signed)
Patient unable to participate in virtual visit despite several attempts to coach him with set up during apt and 2 calls from office.

## 2023-08-01 NOTE — Progress Notes (Deleted)
    Virtual Visit via Video Note  I connected with Jeff [name]. on 08/01/23 at  8:40 AM EDT by a video enabled telemedicine application and verified that I am speaking with the correct person using two identifiers.  Today's Provider: Jacquelin Hawking, MHS, PA-C Introduced myself to the patient as a PA-C and provided education on APPs in clinical practice.   Location: Patient: At home Provider: [place], [place]   I discussed the limitations of evaluation and management by telemedicine and the availability of in person appointments. The patient expressed understanding and agreed to proceed.  Chief Complaint  Patient presents with   Covid Positive    History of Present Illness:   COVID positive      ROS    Observations/Objective:    Due to the nature of the virtual visit, physical exam and observations are limited. Able to obtain the following observations:   Alert, oriented, Appears comfortable, in no acute distress.  No scleral injection, no appreciated hoarseness, tachypnea, wheeze or strider. Able to maintain conversation without visible strain.  No cough appreciated during visit.     Assessment and Plan:  Problem List Items Addressed This Visit   None    No follow-ups on file.   I, Mahir Prabhakar E Brogen Duell, PA-C, have reviewed all documentation for this visit. The documentation on 08/01/23 for the exam, diagnosis, procedures, and orders are all accurate and complete.   Jacquelin Hawking, MHS, PA-C [facility]      Follow Up Instructions:    I discussed the assessment and treatment plan with the patient. The patient was provided an opportunity to ask questions and all were answered. The patient agreed with the plan and demonstrated an understanding of the instructions.   The patient was advised to call back or seek an in-person evaluation if the symptoms worsen or if the condition fails to improve as  anticipated.  I provided *** minutes of non-face-to-face time during this encounter.   Tandre Conly E Jaryn Hocutt, PA-C

## 2023-08-01 NOTE — Telephone Encounter (Signed)
Just a FYI....Marland KitchenMarland KitchenPt already had MyChart VV this morning with Erin Mecum.

## 2023-08-02 ENCOUNTER — Encounter: Payer: Self-pay | Admitting: Physician Assistant

## 2023-08-02 ENCOUNTER — Ambulatory Visit (INDEPENDENT_AMBULATORY_CARE_PROVIDER_SITE_OTHER): Payer: Medicare HMO | Admitting: Physician Assistant

## 2023-08-02 VITALS — BP 118/74 | HR 91 | Resp 16 | Ht 71.0 in | Wt 264.0 lb

## 2023-08-02 DIAGNOSIS — U071 COVID-19: Secondary | ICD-10-CM | POA: Diagnosis not present

## 2023-08-02 NOTE — Progress Notes (Signed)
Acute Office Visit   Patient: Jeff [name].   DOB: 08/20/1951   72 y.o. Male  MRN: [id number] Visit Date: 08/02/2023  Today's healthcare provider: Oswaldo Conroy Teofil Maniaci, PA-C  Introduced myself to the patient as a Secondary school teacher and provided education on APPs in clinical practice.    Chief Complaint  Patient presents with   Covid Positive    X2 days, congestion   Subjective    HPI HPI     Covid Positive    Additional comments: X2 days, congestion      Last edited by Dollene Primrose, CMA on 08/02/2023 12:58 PM.      COVID positive   Onset: sudden   Duration: States symptoms started Sat night   Associated symptoms: congestion and coughing, headaches, he reports he has lost his sense of taste and smell  Interventions:Tylenol and Mucinex   He was at the beach and was around a lot of people  He does not wish to stop his Xarelto  Discussed paxlovid vs symptomatic management   Medications: Outpatient Medications Prior to Visit  Medication Sig   albuterol (VENTOLIN HFA) 108 (90 Base) MCG/ACT inhaler Inhale 2 puffs into the lungs every 4 (four) hours as needed for wheezing or shortness of breath.   Aspirin 325 MG CAPS Take by mouth.   atorvastatin (LIPITOR) 20 MG tablet Take 0.5 tablets (10 mg total) by mouth daily.   benazepril (LOTENSIN) 20 MG tablet Take 1 tablet (20 mg total) by mouth daily.   doxazosin (CARDURA) 2 MG tablet Take 1 tablet (2 mg total) by mouth 2 (two) times daily.   fluticasone (FLONASE) 50 MCG/ACT nasal spray Place 2 sprays into both nostrils daily as needed for allergies or rhinitis.   furosemide (LASIX) 20 MG tablet Take 1 tablet (20 mg total) by mouth daily.   gabapentin (NEURONTIN) 100 MG capsule Take 1 capsule (100 mg total) by mouth 3 (three) times daily.   metFORMIN (GLUCOPHAGE-XR) 500 MG 24 hr tablet Take 2 tablets (1,000 mg total) by mouth 2 (two) times daily as needed.   metoprolol tartrate (LOPRESSOR) 100 MG tablet Take 0.5 tablets (50 mg  total) by mouth 2 (two) times daily.   rivaroxaban (XARELTO) 20 MG TABS tablet Take 1 tablet (20 mg total) by mouth daily.   No facility-administered medications prior to visit.    Review of Systems  Constitutional:  Negative for chills, fatigue and fever.  HENT:  Positive for congestion and postnasal drip. Negative for ear pain, rhinorrhea and sore throat.   Respiratory:  Positive for cough. Negative for shortness of breath and wheezing.   Gastrointestinal:  Positive for nausea (Sunday afternoon- resolved at this time). Negative for diarrhea and vomiting.  Musculoskeletal:  Positive for myalgias (mild).  Neurological:  Positive for headaches.        Objective    BP 118/74   Pulse 91   Resp 16   Ht 5\' 11"  (1.803 m)   Wt 264 lb (119.7 kg)   SpO2 94%   BMI 36.82 kg/m     Physical Exam Vitals reviewed.  Constitutional:      General: He is awake.     Appearance: Normal appearance. He is well-developed and well-groomed.  HENT:     Head: Normocephalic and atraumatic.  Cardiovascular:     Rate and Rhythm: Normal rate and regular rhythm.     Pulses: Normal pulses.  Radial pulses are 2+ on the right side and 2+ on the left side.     Heart sounds: Normal heart sounds. No murmur heard.    No friction rub. No gallop.  Pulmonary:     Effort: Pulmonary effort is normal.     Breath sounds: Normal breath sounds. No decreased air movement. No decreased breath sounds, wheezing, rhonchi or rales.  Neurological:     Mental Status: He is alert.  Psychiatric:        Attention and Perception: Attention and perception normal.        Mood and Affect: Mood and affect normal.        Speech: Speech normal.        Behavior: Behavior normal. Behavior is cooperative.       No results found for any visits on 08/02/23.  Assessment & Plan      No follow-ups on file.     Problem List Items Addressed This Visit   None Visit Diagnoses     COVID    -  Primary Acute, new  concern Patient reports that current symptoms started Saturday night and he tested positive for COVID shortly thereafter. He reports symptoms comprised of congestion, cough, headaches and loss of sense of taste and smell We discussed that he is still within the window for Paxlovid but being on day 4 out of 5 I am unsure of the benefit of antiviral therapy at this time.  We also discussed that some of his medications particularly his Xarelto would need to be adjusted or stopped for the duration of Paxlovid treatment. He did not want to stop his Xarelto and was comfortable proceeding with symptomatic management Recommend over-the-counter medications such as Tylenol, Robitussin, Mucinex, Flonase, daily antihistamine to assist with symptom management per preference Reviewed ED and return precautions Patient voiced understanding and agreement Follow up as needed for persistent or progressing symptoms          No follow-ups on file.   I, Toni Hoffmeister E Denisse Whitenack, PA-C, have reviewed all documentation for this visit. The documentation on 08/02/23 for the exam, diagnosis, procedures, and orders are all accurate and complete.   Jacquelin Hawking, MHS, PA-C [facility]

## 2023-08-02 NOTE — Patient Instructions (Signed)
Symptoms can last for 3-10 days with lingering cough and intermittent symptoms lasting weeks after that.  The goal of treatment at this time is to reduce your symptoms and discomfort   I recommend using Robitussin and Mucinex (regular formulations, nothing with decongestants or DM)  You can also use Tylenol for body aches and fever reduction I also recommend adding an antihistamine to your daily regimen This includes medications like Claritin, Allegra, Zyrtec- the generics of these work very well and are usually less expensive I recommend using Flonase nasal spray - 2 puffs twice per day to help with your nasal congestion The antihistamines and Flonase can take a few weeks to provide significant relief from allergy symptoms but should start to provide some benefit soon. You can use a humidifier at night to help with preventing nasal dryness and irritation   If your symptoms do not improve or become worse in the next 5-7 days please make an apt at the office so we can see you  Go to the ER if you begin to have more serious symptoms such as shortness of breath, trouble breathing, loss of consciousness, swelling around the eyes, high fever, severe lasting headaches, vision changes or neck pain/stiffness.

## 2023-08-19 ENCOUNTER — Ambulatory Visit: Payer: Medicare HMO | Admitting: Family Medicine

## 2023-08-19 ENCOUNTER — Encounter: Payer: Self-pay | Admitting: Family Medicine

## 2023-08-19 VITALS — BP 130/86 | HR 65 | Ht 71.0 in | Wt 264.4 lb

## 2023-08-19 DIAGNOSIS — E782 Mixed hyperlipidemia: Secondary | ICD-10-CM

## 2023-08-19 DIAGNOSIS — D6869 Other thrombophilia: Secondary | ICD-10-CM | POA: Diagnosis not present

## 2023-08-19 DIAGNOSIS — N1831 Chronic kidney disease, stage 3a: Secondary | ICD-10-CM

## 2023-08-19 DIAGNOSIS — Z23 Encounter for immunization: Secondary | ICD-10-CM

## 2023-08-19 DIAGNOSIS — I129 Hypertensive chronic kidney disease with stage 1 through stage 4 chronic kidney disease, or unspecified chronic kidney disease: Secondary | ICD-10-CM | POA: Diagnosis not present

## 2023-08-19 DIAGNOSIS — I420 Dilated cardiomyopathy: Secondary | ICD-10-CM

## 2023-08-19 DIAGNOSIS — Z0000 Encounter for general adult medical examination without abnormal findings: Secondary | ICD-10-CM | POA: Diagnosis not present

## 2023-08-19 DIAGNOSIS — I482 Chronic atrial fibrillation, unspecified: Secondary | ICD-10-CM | POA: Diagnosis not present

## 2023-08-19 DIAGNOSIS — E1159 Type 2 diabetes mellitus with other circulatory complications: Secondary | ICD-10-CM | POA: Diagnosis not present

## 2023-08-19 DIAGNOSIS — D582 Other hemoglobinopathies: Secondary | ICD-10-CM

## 2023-08-19 DIAGNOSIS — E291 Testicular hypofunction: Secondary | ICD-10-CM

## 2023-08-19 DIAGNOSIS — N1832 Chronic kidney disease, stage 3b: Secondary | ICD-10-CM | POA: Insufficient documentation

## 2023-08-19 DIAGNOSIS — I509 Heart failure, unspecified: Secondary | ICD-10-CM | POA: Diagnosis not present

## 2023-08-19 LAB — MICROALBUMIN, URINE WAIVED
Creatinine, Urine Waived: 200 mg/dL (ref 10–300)
Microalb, Ur Waived: 150 mg/L — ABNORMAL HIGH (ref 0–19)

## 2023-08-19 MED ORDER — DOXAZOSIN MESYLATE 2 MG PO TABS: 2.0000 mg | ORAL_TABLET | Freq: Two times a day (BID) | ORAL | 1 refills | Status: DC

## 2023-08-19 MED ORDER — BENAZEPRIL HCL 20 MG PO TABS: 20.0000 mg | ORAL_TABLET | Freq: Every day | ORAL | 1 refills | Status: DC

## 2023-08-19 MED ORDER — FUROSEMIDE 20 MG PO TABS: 20.0000 mg | ORAL_TABLET | Freq: Every day | ORAL | 1 refills | Status: DC

## 2023-08-19 MED ORDER — RIVAROXABAN 20 MG PO TABS
20.0000 mg | ORAL_TABLET | Freq: Every day | ORAL | 1 refills | Status: DC
Start: 2023-08-19 — End: 2023-09-28

## 2023-08-19 MED ORDER — METOPROLOL TARTRATE 100 MG PO TABS: 50.0000 mg | ORAL_TABLET | Freq: Two times a day (BID) | ORAL | 1 refills | Status: DC

## 2023-08-19 MED ORDER — METFORMIN HCL ER 500 MG PO TB24: 1000.0000 mg | ORAL_TABLET | Freq: Two times a day (BID) | ORAL | 1 refills | Status: DC | PRN

## 2023-08-19 MED ORDER — FLUTICASONE PROPIONATE 50 MCG/ACT NA SUSP: 2.0000 | Freq: Every day | NASAL | 2 refills | Status: DC | PRN

## 2023-08-19 MED ORDER — ATORVASTATIN CALCIUM 20 MG PO TABS: 10.0000 mg | ORAL_TABLET | Freq: Every day | ORAL | 1 refills | Status: DC

## 2023-08-19 MED ORDER — ALBUTEROL SULFATE HFA 108 (90 BASE) MCG/ACT IN AERS: 2.0000 | INHALATION_SPRAY | RESPIRATORY_TRACT | 3 refills | Status: AC | PRN

## 2023-08-19 MED ORDER — GABAPENTIN 100 MG PO CAPS: 100.0000 mg | ORAL_CAPSULE | Freq: Three times a day (TID) | ORAL | 1 refills | Status: DC

## 2023-08-19 NOTE — Assessment & Plan Note (Signed)
NSR today. Continue current regimen. Call with any concerns. Continue to monitor.

## 2023-08-19 NOTE — Assessment & Plan Note (Signed)
Rechecking labs today. Await results. Treat as needed.  °

## 2023-08-19 NOTE — Progress Notes (Signed)
BP 130/86   Pulse 65   Ht 5\' 11"  (1.803 m)   Wt 264 lb 6.4 oz (119.9 kg)   SpO2 98%   BMI 36.88 kg/m    Subjective:    Patient ID: Jeff Mealing., male    DOB: June 28, 1951, 72 y.o.   MRN: [id number]  HPI: Jeff [name]. is a 72 y.o. male presenting on 08/19/2023 for comprehensive medical examination. Current medical complaints include:  DIABETES Hypoglycemic episodes:no Polydipsia/polyuria: no Visual disturbance: no Chest pain: no Paresthesias: no Glucose Monitoring: yes  Accucheck frequency: occasionally Taking Insulin?: no Blood Pressure Monitoring: not checking Retinal Examination: Up to Date Foot Exam: Up to Date Diabetic Education: Completed Pneumovax: Up to Date Influenza: Up to Date Aspirin: yes  HYPERTENSION / HYPERLIPIDEMIA Satisfied with current treatment? yes Duration of hypertension: chronic BP monitoring frequency: rarely BP medication side effects: no Past BP meds: benazepril, metoprolol, lasix Duration of hyperlipidemia: chronic Cholesterol medication side effects: no Cholesterol supplements: none Past cholesterol medications: atorvastatin Medication compliance: excellent compliance Aspirin: yes Recent stressors: no Recurrent headaches: no Visual changes: no Palpitations: no Dyspnea: no Chest pain: no Lower extremity edema: no Dizzy/lightheaded: no  LOW TESTOSTERONE Duration: chronic Status: controlled  Satisfied with current treatment:  yes Medication side effects:  N/A Medication compliance: not on anything Decreased libido: no Fatigue: no Depressed mood: no Muscle weakness: no Erectile dysfunction: no  Interim Problems from his last visit: no  Depression Screen done today and results listed below:     08/19/2023    8:57 AM 08/02/2023    8:23 AM 08/01/2023    8:28 AM 04/19/2023   10:32 AM 01/31/2023    8:19 AM  Depression screen PHQ 2/9  Decreased Interest 0 0 0 0 0  Down, Depressed, Hopeless 0 0 0 0 0  PHQ - 2  Score 0 0 0 0 0  Altered sleeping 0   0 0  Tired, decreased energy 0   0 0  Change in appetite 0   0 0  Feeling bad or failure about yourself  0   0 0  Trouble concentrating 0   0 0  Moving slowly or fidgety/restless 0   0 0  Suicidal thoughts 0   0 0  PHQ-9 Score 0   0 0  Difficult doing work/chores Not difficult at all   Not difficult at all Not difficult at all    Past Medical History:  Past Medical History:  Diagnosis Date   Chronic deep vein thrombosis (DVT) (HCC)    Chronic low back pain    Chronic systolic heart failure (HCC)    a. TTE 2011 with EF 40-45% per notes   Diabetes mellitus without complication (HCC)    Erectile dysfunction    HLD (hyperlipidemia)    Hypertension    Hypogonadism in male    Permanent atrial fibrillation (HCC)    a. s/p DCCV 2011; b. redeveloped Afib in 2016; c. CHADS2VASc at least 5 (CHF, HTN, age x 1, DM, vascular disease); d. on Xarelto   Torn meniscus    right knee    Surgical History:  Past Surgical History:  Procedure Laterality Date   COLONOSCOPY WITH PROPOFOL N/A 05/28/2016   Procedure: COLONOSCOPY WITH PROPOFOL;  Surgeon: Midge Minium, MD;  Location: [facility];  Service: Endoscopy;  Laterality: N/A;  diabetic - oral meds   DVT, leg Left    INSERTION OF VENA CAVA FILTER  2014   KNEE ARTHROSCOPY WITH  MEDIAL MENISECTOMY Right 05/31/2019   Procedure: KNEE ARTHROSCOPY WITH partial  MEDIAL MENISECTOMY;  Surgeon: Signa Kell, MD;  Location: [facility];  Service: Orthopedics;  Laterality: Right;   LOWER EXTREMITY VENOGRAPHY Left 08/21/2021   Procedure: LOWER EXTREMITY VENOGRAPHY;  Surgeon: Renford Dills, MD;  Location: [facility];  Service: Cardiovascular;  Laterality: Left;  Possible IVC Filter Insertion   PERIPHERAL VASCULAR THROMBECTOMY Left 12/01/2017   Procedure: PERIPHERAL VASCULAR THROMBECTOMY;  Surgeon: Renford Dills, MD;  Location: [facility];  Service: Cardiovascular;  Laterality: Left;     Medications:  Current Outpatient Medications on File Prior to Visit  Medication Sig   Aspirin 325 MG CAPS Take by mouth.   No current facility-administered medications on file prior to visit.    Allergies:  No Known Allergies  Social History:  Social History   Socioeconomic History   Marital status: Single    Spouse name: Not on file   Number of children: Not on file   Years of education: Not on file   Highest education level: Some college, no degree  Occupational History   Occupation: [employer]   Tobacco Use   Smoking status: Never   Smokeless tobacco: Never  Vaping Use   Vaping status: Never Used  Substance and Sexual Activity   Alcohol use: No    Alcohol/week: 0.0 standard drinks of alcohol   Drug use: No   Sexual activity: Yes  Other Topics Concern   Not on file  Social History Narrative   Former Emergency planning/management officer    Independent at baseline. [employer]   Works at [employer]    Social Determinants of [employer]: Low Risk  (01/31/2023)   Overall Financial Resource Strain (CARDIA)    Difficulty of Paying Living Expenses: Not hard at all  Food Insecurity: No Food Insecurity (01/31/2023)   Hunger Vital Sign    Worried About Running Out of Food in the Last Year: Never true    Ran Out of Food in the Last Year: Never true  Transportation Needs: No Transportation Needs (01/31/2023)   PRAPARE - Administrator, Civil Service (Medical): No    Lack of Transportation (Non-Medical): No  Physical Activity: Sufficiently Active (01/31/2023)   Exercise Vital Sign    Days of Exercise per Week: 4 days    Minutes of Exercise per Session: 60 min  Stress: No Stress Concern Present (01/31/2023)   [employer] of Occupational Health - Occupational Stress Questionnaire    Feeling of Stress : Not at all  Social Connections: Socially Integrated (01/31/2023)   Social Connection and Isolation Panel [NHANES]    Frequency of Communication  with Friends and Family: More than three times a week    Frequency of Social Gatherings with Friends and Family: More than three times a week    Attends Religious Services: More than 4 times per year    Active Member of [employer] or Organizations: Yes    Attends Banker Meetings: More than 4 times per year    Marital Status: Living with partner  Intimate Partner Violence: Not At Risk (01/31/2023)   Humiliation, Afraid, Rape, and Kick questionnaire    Fear of Current or Ex-Partner: No    Emotionally Abused: No    Physically Abused: No    Sexually Abused: No   Social History   Tobacco Use  Smoking Status Never  Smokeless Tobacco Never   Social History  Substance and Sexual Activity  Alcohol Use No   Alcohol/week: 0.0 standard drinks of alcohol    Family History:  Family History  Problem Relation Age of Onset   Lymphoma Mother    Heart failure Father    Diabetes Father    Atrial fibrillation Brother    Diabetes Mellitus II Other    Heart attack Neg Hx    Hypertension Neg Hx    Cancer Neg Hx    COPD Neg Hx    Stroke Neg Hx    Prostate cancer Neg Hx    Kidney cancer Neg Hx    Bladder Cancer Neg Hx     Past medical history, surgical history, medications, allergies, family history and social history reviewed with patient today and changes made to appropriate areas of the chart.   Review of Systems  Constitutional: Negative.   HENT:  Positive for congestion. Negative for ear discharge, ear pain, hearing loss, nosebleeds, sinus pain, sore throat and tinnitus.   Eyes: Negative.   Respiratory: Negative.  Negative for stridor.   Cardiovascular: Negative.   Gastrointestinal: Negative.   Genitourinary:  Positive for frequency. Negative for dysuria, flank pain, hematuria and urgency.  Musculoskeletal: Negative.   Skin: Negative.   Neurological: Negative.   Endo/Heme/Allergies:  Positive for environmental allergies. Negative for polydipsia. Bruises/bleeds easily.   Psychiatric/Behavioral: Negative.     All other ROS negative except what is listed above and in the HPI.      Objective:    BP 130/86   Pulse 65   Ht 5\' 11"  (1.803 m)   Wt 264 lb 6.4 oz (119.9 kg)   SpO2 98%   BMI 36.88 kg/m   Wt Readings from Last 3 Encounters:  08/19/23 264 lb 6.4 oz (119.9 kg)  08/02/23 264 lb (119.7 kg)  04/19/23 252 lb 6.4 oz (114.5 kg)    Physical Exam Vitals and nursing note reviewed.  Constitutional:      General: He is not in acute distress.    Appearance: Normal appearance. He is obese. He is not ill-appearing, toxic-appearing or diaphoretic.  HENT:     Head: Normocephalic and atraumatic.     Right Ear: Tympanic membrane, ear canal and external ear normal. There is no impacted cerumen.     Left Ear: Tympanic membrane, ear canal and external ear normal. There is no impacted cerumen.     Nose: Nose normal. No congestion or rhinorrhea.     Mouth/Throat:     Mouth: Mucous membranes are moist.     Pharynx: Oropharynx is clear. No oropharyngeal exudate or posterior oropharyngeal erythema.  Eyes:     General: No scleral icterus.       Right eye: No discharge.        Left eye: No discharge.     Extraocular Movements: Extraocular movements intact.     Conjunctiva/sclera: Conjunctivae normal.     Pupils: Pupils are equal, round, and reactive to light.  Neck:     Vascular: No carotid bruit.  Cardiovascular:     Rate and Rhythm: Normal rate and regular rhythm.     Pulses: Normal pulses.     Heart sounds: No murmur heard.    No friction rub. No gallop.  Pulmonary:     Effort: Pulmonary effort is normal. No respiratory distress.     Breath sounds: Normal breath sounds. No stridor. No wheezing, rhonchi or rales.  Chest:     Chest wall: No tenderness.  Abdominal:  General: Abdomen is flat. Bowel sounds are normal. There is no distension.     Palpations: Abdomen is soft. There is no mass.     Tenderness: There is no abdominal tenderness. There is  no right CVA tenderness, left CVA tenderness, guarding or rebound.     Hernia: No hernia is present.  Genitourinary:    Comments: Genital exam deferred with shared decision making Musculoskeletal:        General: No swelling, tenderness, deformity or signs of injury.     Cervical back: Normal range of motion and neck supple. No rigidity. No muscular tenderness.     Right lower leg: No edema.     Left lower leg: No edema.  Lymphadenopathy:     Cervical: No cervical adenopathy.  Skin:    General: Skin is warm and dry.     Capillary Refill: Capillary refill takes less than 2 seconds.     Coloration: Skin is not jaundiced or pale.     Findings: No bruising, erythema, lesion or rash.  Neurological:     General: No focal deficit present.     Mental Status: He is alert and oriented to person, place, and time.     Cranial Nerves: No cranial nerve deficit.     Sensory: No sensory deficit.     Motor: No weakness.     Coordination: Coordination normal.     Gait: Gait normal.     Deep Tendon Reflexes: Reflexes normal.  Psychiatric:        Mood and Affect: Mood normal.        Behavior: Behavior normal.        Thought Content: Thought content normal.        Judgment: Judgment normal.     Results for orders placed or performed in visit on 08/19/23  Microalbumin, Urine Waived  Result Value Ref Range   Microalb, Ur Waived 150 (H) 0 - 19 mg/L   Creatinine, Urine Waived 200 10 - 300 mg/dL   Microalb/Creat Ratio 30-300 (H) <30 mg/g      Assessment & Plan:   Problem List Items Addressed This Visit       Cardiovascular and Mediastinum   Congestive dilated cardiomyopathy (HCC)    Continue to follow with cardiology. Stable. Continue to monitor. Labs drawn today.      Relevant Medications   atorvastatin (LIPITOR) 20 MG tablet   benazepril (LOTENSIN) 20 MG tablet   doxazosin (CARDURA) 2 MG tablet   furosemide (LASIX) 20 MG tablet   metoprolol tartrate (LOPRESSOR) 100 MG tablet    rivaroxaban (XARELTO) 20 MG TABS tablet   Atrial fibrillation, chronic (HCC)    NSR today. Continue current regimen. Call with any concerns. Continue to monitor.       Relevant Medications   atorvastatin (LIPITOR) 20 MG tablet   benazepril (LOTENSIN) 20 MG tablet   doxazosin (CARDURA) 2 MG tablet   furosemide (LASIX) 20 MG tablet   metoprolol tartrate (LOPRESSOR) 100 MG tablet   rivaroxaban (XARELTO) 20 MG TABS tablet   Congestive heart failure (HCC)    Continue to follow with cardiology. Stable. Continue to monitor. Labs drawn today.      Relevant Medications   atorvastatin (LIPITOR) 20 MG tablet   benazepril (LOTENSIN) 20 MG tablet   doxazosin (CARDURA) 2 MG tablet   furosemide (LASIX) 20 MG tablet   metoprolol tartrate (LOPRESSOR) 100 MG tablet   rivaroxaban (XARELTO) 20 MG TABS tablet     Endocrine  Diabetes mellitus (HCC)    Rechecking labs today. Await results. Treat as needed.       Relevant Medications   atorvastatin (LIPITOR) 20 MG tablet   benazepril (LOTENSIN) 20 MG tablet   metFORMIN (GLUCOPHAGE-XR) 500 MG 24 hr tablet   Other Relevant Orders   Comprehensive metabolic panel   Hgb A1c w/o eAG   Hypogonadism in male    Rechecking labs today. Await results. Treat as needed.       Relevant Orders   Comprehensive metabolic panel   PSA   Testosterone, free, total([facility])     Genitourinary   Hypertensive renal disease    Under good control on current regimen. Continue current regimen. Continue to monitor. Call with any concerns. Refills given. Labs drawn today.        Relevant Orders   Comprehensive metabolic panel   TSH   Microalbumin, Urine Waived (Completed)   Stage 3a chronic kidney disease (HCC)    Rechecking labs today. Await results.       Relevant Orders   Comprehensive metabolic panel     Hematopoietic and Hemostatic   Acquired thrombophilia (HCC)    Due to a fib. Stable on xarelto. Continue to monitor. Call with any concerns         Other   Mixed hyperlipidemia    Under good control on current regimen. Continue current regimen. Continue to monitor. Call with any concerns. Refills given. Labs drawn today.        Relevant Medications   atorvastatin (LIPITOR) 20 MG tablet   benazepril (LOTENSIN) 20 MG tablet   doxazosin (CARDURA) 2 MG tablet   furosemide (LASIX) 20 MG tablet   metoprolol tartrate (LOPRESSOR) 100 MG tablet   rivaroxaban (XARELTO) 20 MG TABS tablet   Other Relevant Orders   Comprehensive metabolic panel   Lipid Panel w/o Chol/HDL Ratio   Morbid obesity (HCC)    Encouraged diet and exercise with goal of losing 1-2lbs per week. Continue to monitor. Call with any concerns. Continue to monitor.       Relevant Medications   metFORMIN (GLUCOPHAGE-XR) 500 MG 24 hr tablet   Elevated hemoglobin (HCC)    Rechecking labs today. Await results. Treat as needed.       Relevant Orders   Comprehensive metabolic panel   CBC with Differential/Platelet   Other Visit Diagnoses     Routine general medical examination at a health care facility    -  Primary   Vaccines up to date. Screening labs checked today. Colonoscopy up to date. Continue diet and exercise. Call with any concerns. Continue to monitor.   Needs flu shot       Flu shot given today.   Relevant Orders   Flu Vaccine Trivalent High Dose (Fluad) (Completed)        LABORATORY TESTING:  Health maintenance labs ordered today as discussed above.   The natural history of prostate cancer and ongoing controversy regarding screening and potential treatment outcomes of prostate cancer has been discussed with the patient. The meaning of a false positive PSA and a false negative PSA has been discussed. He indicates understanding of the limitations of this screening test and wishes  to proceed with screening PSA testing.   IMMUNIZATIONS:   - Tdap: Tetanus vaccination status reviewed: last tetanus booster within 10 years. - Influenza: Up to  date - Pneumovax: Up to date - Prevnar: Up to date - COVID: Refused - HPV: Not applicable - Shingrix vaccine: Up to  date  SCREENING: - Colonoscopy: Up to date  Discussed with patient purpose of the colonoscopy is to detect colon cancer at curable precancerous or early stages   PATIENT COUNSELING:    Sexuality: Discussed sexually transmitted diseases, partner selection, use of condoms, avoidance of unintended pregnancy  and contraceptive alternatives.   Advised to avoid cigarette smoking.  I discussed with the patient that most people either abstain from alcohol or drink within safe limits (<=14/week and <=4 drinks/occasion for males, <=7/weeks and <= 3 drinks/occasion for females) and that the risk for alcohol disorders and other health effects rises proportionally with the number of drinks per week and how often a drinker exceeds daily limits.  Discussed cessation/primary prevention of drug use and availability of treatment for abuse.   Diet: Encouraged to adjust caloric intake to maintain  or achieve ideal body weight, to reduce intake of dietary saturated fat and total fat, to limit sodium intake by avoiding high sodium foods and not adding table salt, and to maintain adequate dietary potassium and calcium preferably from fresh fruits, vegetables, and low-fat dairy products.    stressed the importance of regular exercise  Injury prevention: Discussed safety belts, safety helmets, smoke detector, smoking near bedding or upholstery.   Dental health: Discussed importance of regular tooth brushing, flossing, and dental visits.   Follow up plan: NEXT PREVENTATIVE PHYSICAL DUE IN 1 YEAR. Return in about 3 months (around 11/19/2023).

## 2023-08-19 NOTE — Assessment & Plan Note (Signed)
Continue to follow with cardiology. Stable. Continue to monitor. Labs drawn today.

## 2023-08-19 NOTE — Assessment & Plan Note (Signed)
Encouraged diet and exercise with goal of losing 1-2lbs per week. Continue to monitor. Call with any concerns. Continue to monitor.

## 2023-08-19 NOTE — Assessment & Plan Note (Signed)
Due to a fib. Stable on xarelto. Continue to monitor. Call with any concerns

## 2023-08-19 NOTE — Assessment & Plan Note (Signed)
Under good control on current regimen. Continue current regimen. Continue to monitor. Call with any concerns. Refills given. Labs drawn today.   

## 2023-08-19 NOTE — Assessment & Plan Note (Signed)
Rechecking labs today. Await results.  

## 2023-08-21 ENCOUNTER — Other Ambulatory Visit: Payer: Self-pay | Admitting: Family Medicine

## 2023-08-21 MED ORDER — EMPAGLIFLOZIN 25 MG PO TABS: 25.0000 mg | ORAL_TABLET | Freq: Every day | ORAL | 3 refills | Status: DC

## 2023-08-22 LAB — CBC WITH DIFFERENTIAL/PLATELET
Basophils Absolute: 0.1 10*3/uL (ref 0.0–0.2)
Basos: 1 %
EOS (ABSOLUTE): 0.1 10*3/uL (ref 0.0–0.4)
Eos: 1 %
Hematocrit: 44.8 % (ref 37.5–51.0)
Hemoglobin: 14.2 g/dL (ref 13.0–17.7)
Immature Grans (Abs): 0.1 10*3/uL (ref 0.0–0.1)
Immature Granulocytes: 1 %
Lymphocytes Absolute: 0.8 10*3/uL (ref 0.7–3.1)
Lymphs: 12 %
MCH: 28.9 pg (ref 26.6–33.0)
MCHC: 31.7 g/dL (ref 31.5–35.7)
MCV: 91 fL (ref 79–97)
Monocytes Absolute: 0.5 10*3/uL (ref 0.1–0.9)
Monocytes: 8 %
Neutrophils Absolute: 5 10*3/uL (ref 1.4–7.0)
Neutrophils: 77 %
Platelets: 249 10*3/uL (ref 150–450)
RBC: 4.92 x10E6/uL (ref 4.14–5.80)
RDW: 11.7 % (ref 11.6–15.4)
WBC: 6.5 10*3/uL (ref 3.4–10.8)

## 2023-08-22 LAB — COMPREHENSIVE METABOLIC PANEL
ALT: 10 [IU]/L (ref 0–44)
AST: 16 [IU]/L (ref 0–40)
Albumin: 3.9 g/dL (ref 3.8–4.8)
Alkaline Phosphatase: 88 [IU]/L (ref 44–121)
BUN/Creatinine Ratio: 18 (ref 10–24)
BUN: 24 mg/dL (ref 8–27)
Bilirubin Total: 0.4 mg/dL (ref 0.0–1.2)
CO2: 21 mmol/L (ref 20–29)
Calcium: 8.9 mg/dL (ref 8.6–10.2)
Chloride: 105 mmol/L (ref 96–106)
Creatinine, Ser: 1.3 mg/dL — ABNORMAL HIGH (ref 0.76–1.27)
Globulin, Total: 2.7 g/dL (ref 1.5–4.5)
Glucose: 254 mg/dL — ABNORMAL HIGH (ref 70–99)
Potassium: 5.1 mmol/L (ref 3.5–5.2)
Sodium: 144 mmol/L (ref 134–144)
Total Protein: 6.6 g/dL (ref 6.0–8.5)
eGFR: 58 mL/min/{1.73_m2} — ABNORMAL LOW (ref >59)

## 2023-08-22 LAB — TESTOSTERONE, FREE, TOTAL, SHBG
Sex Hormone Binding: 30.2 nmol/L (ref 19.3–76.4)
Testosterone, Free: 4.9 pg/mL — ABNORMAL LOW (ref 6.6–18.1)
Testosterone: 281 ng/dL (ref 264–916)

## 2023-08-22 LAB — PSA: Prostate Specific Ag, Serum: 2.1 ng/mL (ref 0.0–4.0)

## 2023-08-22 LAB — LIPID PANEL W/O CHOL/HDL RATIO
Cholesterol, Total: 96 mg/dL — ABNORMAL LOW (ref 100–199)
HDL: 28 mg/dL — ABNORMAL LOW (ref >39)
LDL Chol Calc (NIH): 55 mg/dL (ref 0–99)
Triglycerides: 56 mg/dL (ref 0–149)
VLDL Cholesterol Cal: 13 mg/dL (ref 5–40)

## 2023-08-22 LAB — TSH: TSH: 2.77 u[IU]/mL (ref 0.450–4.500)

## 2023-08-22 LAB — HGB A1C W/O EAG: Hgb A1c MFr Bld: 8.2 % — ABNORMAL HIGH (ref 4.8–5.6)

## 2023-08-24 ENCOUNTER — Other Ambulatory Visit: Payer: Self-pay | Admitting: Family Medicine

## 2023-08-24 NOTE — Telephone Encounter (Signed)
Requested Prescriptions  Pending Prescriptions Disp Refills   metoprolol tartrate (LOPRESSOR) 100 MG tablet [Pharmacy Med Name: METOPROLOL TARTRATE 100 MG TAB] 180 tablet 1    Sig: TAKE 1 TABLET BY MOUTH TWICE A DAY     Cardiovascular:  Beta Blockers Passed - 08/24/2023  1:27 AM      Passed - Last BP in normal range    BP Readings from Last 1 Encounters:  08/19/23 130/86         Passed - Last Heart Rate in normal range    Pulse Readings from Last 1 Encounters:  08/19/23 65         Passed - Valid encounter within last 6 months    Recent Outpatient Visits           5 days ago Routine general medical examination at a health care facility   [facility] [place], Megan P, DO   3 weeks ago COVID   [facility] [place], Oswaldo Conroy, PA-C   3 weeks ago [place] and inaccessibility of telehealth   [facility] [place], [place], DO   3 weeks ago COVID   [facility] [place], Oswaldo Conroy, PA-C   4 months ago Type 2 diabetes mellitus with other circulatory complication, without long-term current use of insulin [facility])   [facility] [place], Oralia Rud, DO       Future Appointments             In 2 months Laural Benes, Oralia Rud, DO [facility]

## 2023-09-28 ENCOUNTER — Other Ambulatory Visit: Payer: Self-pay

## 2023-09-28 DIAGNOSIS — I482 Chronic atrial fibrillation, unspecified: Secondary | ICD-10-CM

## 2023-09-28 MED ORDER — RIVAROXABAN 20 MG PO TABS: 20.0000 mg | ORAL_TABLET | Freq: Every day | ORAL | 0 refills | Status: DC

## 2023-09-28 NOTE — Telephone Encounter (Signed)
Prescription refill request for Xarelto received.  Indication:afib Last office visit:needs appt Weight:119.9  kg Age:72 Scr:1.30  10/24 CrCl:87.11  ml/min  Prescription refilled

## 2023-10-03 ENCOUNTER — Telehealth: Payer: Self-pay | Admitting: Family Medicine

## 2023-10-03 DIAGNOSIS — I10 Essential (primary) hypertension: Secondary | ICD-10-CM | POA: Diagnosis not present

## 2023-10-03 DIAGNOSIS — E113293 Type 2 diabetes mellitus with mild nonproliferative diabetic retinopathy without macular edema, bilateral: Secondary | ICD-10-CM | POA: Diagnosis not present

## 2023-10-03 DIAGNOSIS — H35033 Hypertensive retinopathy, bilateral: Secondary | ICD-10-CM | POA: Diagnosis not present

## 2023-10-03 DIAGNOSIS — R29810 Facial weakness: Secondary | ICD-10-CM | POA: Diagnosis not present

## 2023-10-03 DIAGNOSIS — H40023 Open angle with borderline findings, high risk, bilateral: Secondary | ICD-10-CM | POA: Diagnosis not present

## 2023-10-03 DIAGNOSIS — H25813 Combined forms of age-related cataract, bilateral: Secondary | ICD-10-CM | POA: Diagnosis not present

## 2023-10-03 NOTE — Telephone Encounter (Unsigned)
Copied from CRM [id number]. Topic: General - Other >> Oct 03, 2023 12:02 PM Marlow Baars wrote: Reason for CRM: Dr Valora Piccolo with [facility] called in wanting to speak with his provider about some concerns she has that she saw during the eye exam. Please assist further

## 2023-10-04 ENCOUNTER — Telehealth: Payer: Self-pay | Admitting: Family Medicine

## 2023-10-04 ENCOUNTER — Ambulatory Visit
Admission: RE | Admit: 2023-10-04 | Discharge: 2023-10-04 | Disposition: A | Discharge status: Home / Self Care | Payer: Medicare HMO | Source: Ambulatory Visit | Attending: Family Medicine | Admitting: Family Medicine

## 2023-10-04 ENCOUNTER — Ambulatory Visit (INDEPENDENT_AMBULATORY_CARE_PROVIDER_SITE_OTHER): Payer: Medicare HMO | Admitting: Family Medicine

## 2023-10-04 ENCOUNTER — Encounter: Payer: Self-pay | Admitting: Family Medicine

## 2023-10-04 ENCOUNTER — Other Ambulatory Visit: Payer: Self-pay | Admitting: Family Medicine

## 2023-10-04 VITALS — BP 125/75 | HR 88 | Temp 97.9°F | Wt 273.0 lb

## 2023-10-04 DIAGNOSIS — I6782 Cerebral ischemia: Secondary | ICD-10-CM | POA: Diagnosis not present

## 2023-10-04 DIAGNOSIS — R29810 Facial weakness: Secondary | ICD-10-CM | POA: Diagnosis not present

## 2023-10-04 DIAGNOSIS — R29818 Other symptoms and signs involving the nervous system: Secondary | ICD-10-CM | POA: Diagnosis not present

## 2023-10-04 NOTE — Telephone Encounter (Signed)
Copied from CRM [id number]. Topic: General - Other >> Oct 03, 2023  5:30 PM Everette C wrote: Reason for CRM: Dr. Valora Piccolo with [facility] would like to be called at [phone] when possible by Dr. Laural Benes to further discuss concerns related to the patient  Please contact when available

## 2023-10-04 NOTE — Telephone Encounter (Signed)
Called by his daughter who is also his eye doctor- having facial weakness, no other symptoms. He is not sure how long it's been going on for. Will get him in for appt today at 1PM

## 2023-10-04 NOTE — Progress Notes (Signed)
BP 125/75 (BP Location: Left Arm, Patient Position: Sitting, Cuff Size: Large)   Pulse 88   Temp 97.9 F (36.6 C) (Oral)   Wt 273 lb (123.8 kg)   SpO2 100%   BMI 38.08 kg/m    Subjective:    Patient ID: Jeff [name]., male    DOB: 03-21-1951, 72 y.o.   MRN: [id number]  HPI: Jeff [name]. is a 72 y.o. male  Chief Complaint  Patient presents with   Facial weakness   Unclear when it's happened. He went to his eye doctor today who is also his daughter and she noticed some weakness on the R side of his face when he smiled- his cheek looked a little lower and his mouth wasn't drawing up as much. He had not noticed it and so he doesn't know how long it's been going on for. He denies any pain or any other symptoms. No other concerns or complaints at this time.   Relevant past medical, surgical, family and social history reviewed and updated as indicated. Interim medical history since our last visit reviewed. Allergies and medications reviewed and updated.  Review of Systems  Constitutional: Negative.   Respiratory: Negative.    Cardiovascular: Negative.   Gastrointestinal: Negative.   Musculoskeletal: Negative.   Neurological:  Positive for facial asymmetry. Negative for dizziness, tremors, seizures, syncope, speech difficulty, weakness, light-headedness, numbness and headaches.  Psychiatric/Behavioral: Negative.      Per HPI unless specifically indicated above     Objective:    BP 125/75 (BP Location: Left Arm, Patient Position: Sitting, Cuff Size: Large)   Pulse 88   Temp 97.9 F (36.6 C) (Oral)   Wt 273 lb (123.8 kg)   SpO2 100%   BMI 38.08 kg/m   Wt Readings from Last 3 Encounters:  10/04/23 273 lb (123.8 kg)  08/19/23 264 lb 6.4 oz (119.9 kg)  08/02/23 264 lb (119.7 kg)    Physical Exam Vitals and nursing note reviewed.  Constitutional:      General: He is not in acute distress.    Appearance: Normal appearance. He is not ill-appearing,  toxic-appearing or diaphoretic.  HENT:     Head: Normocephalic and atraumatic.     Right Ear: External ear normal.     Left Ear: External ear normal.     Nose: Nose normal.     Mouth/Throat:     Mouth: Mucous membranes are moist.     Pharynx: Oropharynx is clear.  Eyes:     General: No scleral icterus.       Right eye: No discharge.        Left eye: No discharge.     Extraocular Movements: Extraocular movements intact.     Conjunctiva/sclera: Conjunctivae normal.     Pupils: Pupils are equal, round, and reactive to light.  Cardiovascular:     Rate and Rhythm: Normal rate and regular rhythm.     Pulses: Normal pulses.     Heart sounds: Normal heart sounds. No murmur heard.    No friction rub. No gallop.  Pulmonary:     Effort: Pulmonary effort is normal. No respiratory distress.     Breath sounds: Normal breath sounds. No stridor. No wheezing, rhonchi or rales.  Chest:     Chest wall: No tenderness.  Musculoskeletal:        General: Normal range of motion.     Cervical back: Normal range of motion and neck supple.  Skin:  General: Skin is warm and dry.     Capillary Refill: Capillary refill takes less than 2 seconds.     Coloration: Skin is not jaundiced or pale.     Findings: No bruising, erythema, lesion or rash.  Neurological:     Mental Status: He is alert and oriented to person, place, and time. Mental status is at baseline.     Cranial Nerves: No cranial nerve deficit.     Sensory: No sensory deficit.     Motor: No weakness.     Coordination: Coordination normal.     Gait: Gait normal.     Deep Tendon Reflexes: Reflexes normal.     Comments: Slight droop on R side to lateral side of his mouth and over his zygoma.   Psychiatric:        Mood and Affect: Mood normal.        Behavior: Behavior normal.        Thought Content: Thought content normal.        Judgment: Judgment normal.     Results for orders placed or performed in visit on 08/19/23  Comprehensive  metabolic panel  Result Value Ref Range   Glucose 254 (H) 70 - 99 mg/dL   BUN 24 8 - 27 mg/dL   Creatinine, Ser 1.61 (H) 0.76 - 1.27 mg/dL   eGFR 58 (L) >09 UE/AVW/0.98   BUN/Creatinine Ratio 18 10 - 24   Sodium 144 134 - 144 mmol/L   Potassium 5.1 3.5 - 5.2 mmol/L   Chloride 105 96 - 106 mmol/L   CO2 21 20 - 29 mmol/L   Calcium 8.9 8.6 - 10.2 mg/dL   Total Protein 6.6 6.0 - 8.5 g/dL   Albumin 3.9 3.8 - 4.8 g/dL   Globulin, Total 2.7 1.5 - 4.5 g/dL   Bilirubin Total 0.4 0.0 - 1.2 mg/dL   Alkaline Phosphatase 88 44 - 121 IU/L   AST 16 0 - 40 IU/L   ALT 10 0 - 44 IU/L  CBC with Differential/Platelet  Result Value Ref Range   WBC 6.5 3.4 - 10.8 x10E3/uL   RBC 4.92 4.14 - 5.80 x10E6/uL   Hemoglobin 14.2 13.0 - 17.7 g/dL   Hematocrit 11.9 14.7 - 51.0 %   MCV 91 79 - 97 fL   MCH 28.9 26.6 - 33.0 pg   MCHC 31.7 31.5 - 35.7 g/dL   RDW 82.9 56.2 - 13.0 %   Platelets 249 150 - 450 x10E3/uL   Neutrophils 77 Not Estab. %   Lymphs 12 Not Estab. %   Monocytes 8 Not Estab. %   Eos 1 Not Estab. %   Basos 1 Not Estab. %   Neutrophils Absolute 5.0 1.4 - 7.0 x10E3/uL   Lymphocytes Absolute 0.8 0.7 - 3.1 x10E3/uL   Monocytes Absolute 0.5 0.1 - 0.9 x10E3/uL   EOS (ABSOLUTE) 0.1 0.0 - 0.4 x10E3/uL   Basophils Absolute 0.1 0.0 - 0.2 x10E3/uL   Immature Granulocytes 1 Not Estab. %   Immature Grans (Abs) 0.1 0.0 - 0.1 x10E3/uL  Lipid Panel w/o Chol/HDL Ratio  Result Value Ref Range   Cholesterol, Total 96 (L) 100 - 199 mg/dL   Triglycerides 56 0 - 149 mg/dL   HDL 28 (L) >86 mg/dL   VLDL Cholesterol Cal 13 5 - 40 mg/dL   LDL Chol Calc (NIH) 55 0 - 99 mg/dL  PSA  Result Value Ref Range   Prostate Specific Ag, Serum 2.1 0.0 - 4.0 ng/mL  TSH  Result Value Ref Range   TSH 2.770 0.450 - 4.500 uIU/mL  Microalbumin, Urine Waived  Result Value Ref Range   Microalb, Ur Waived 150 (H) 0 - 19 mg/L   Creatinine, Urine Waived 200 10 - 300 mg/dL   Microalb/Creat Ratio 30-300 (H) <30 mg/g  Hgb A1c  w/o eAG  Result Value Ref Range   Hgb A1c MFr Bld 8.2 (H) 4.8 - 5.6 %  Testosterone, free, total([facility])  Result Value Ref Range   Testosterone 281 264 - 916 ng/dL   Testosterone, Free 4.9 (L) 6.6 - 18.1 pg/mL   Sex Hormone Binding 30.2 19.3 - 76.4 nmol/L      Assessment & Plan:   Problem List Items Addressed This Visit   None Visit Diagnoses     Facial droop    -  Primary   Will check CT head. Await results. May need MRI pending results. Already on statin. DM and BP undr good control.        Follow up plan: Return Pending results.Marland Kitchen

## 2023-10-04 NOTE — Telephone Encounter (Signed)
Provider has spoken to doctor- see note.

## 2023-10-04 NOTE — Telephone Encounter (Signed)
Left message for Dr. Valora Piccolo to return call as able. Would like to obtain more information about her concerns before passing onto provider.

## 2023-10-05 ENCOUNTER — Other Ambulatory Visit: Payer: Self-pay | Admitting: Family Medicine

## 2023-10-05 DIAGNOSIS — Z95828 Presence of other vascular implants and grafts: Secondary | ICD-10-CM | POA: Diagnosis not present

## 2023-10-05 DIAGNOSIS — N2889 Other specified disorders of kidney and ureter: Secondary | ICD-10-CM | POA: Diagnosis not present

## 2023-10-05 DIAGNOSIS — Z86718 Personal history of other venous thrombosis and embolism: Secondary | ICD-10-CM | POA: Diagnosis not present

## 2023-10-05 DIAGNOSIS — I82220 Acute embolism and thrombosis of inferior vena cava: Secondary | ICD-10-CM | POA: Diagnosis not present

## 2023-10-05 DIAGNOSIS — R29810 Facial weakness: Secondary | ICD-10-CM

## 2023-10-05 DIAGNOSIS — I871 Compression of vein: Secondary | ICD-10-CM | POA: Diagnosis not present

## 2023-10-05 DIAGNOSIS — Z79899 Other long term (current) drug therapy: Secondary | ICD-10-CM | POA: Diagnosis not present

## 2023-10-10 ENCOUNTER — Ambulatory Visit
Admission: RE | Admit: 2023-10-10 | Discharge: 2023-10-10 | Disposition: A | Discharge status: Home / Self Care | Payer: Medicare HMO | Source: Ambulatory Visit | Attending: Family Medicine | Admitting: Family Medicine

## 2023-10-10 ENCOUNTER — Ambulatory Visit: Payer: Medicare HMO

## 2023-10-10 DIAGNOSIS — Z8673 Personal history of transient ischemic attack (TIA), and cerebral infarction without residual deficits: Secondary | ICD-10-CM | POA: Diagnosis not present

## 2023-10-10 DIAGNOSIS — R29810 Facial weakness: Secondary | ICD-10-CM | POA: Insufficient documentation

## 2023-10-10 DIAGNOSIS — R29818 Other symptoms and signs involving the nervous system: Secondary | ICD-10-CM | POA: Diagnosis not present

## 2023-10-14 ENCOUNTER — Telehealth: Payer: Self-pay | Admitting: Cardiovascular Disease

## 2023-10-14 ENCOUNTER — Other Ambulatory Visit: Payer: Self-pay

## 2023-10-14 DIAGNOSIS — I482 Chronic atrial fibrillation, unspecified: Secondary | ICD-10-CM

## 2023-10-14 MED ORDER — RIVAROXABAN 20 MG PO TABS: 20.0000 mg | ORAL_TABLET | Freq: Every day | ORAL | 0 refills | Status: DC

## 2023-10-14 NOTE — Telephone Encounter (Signed)
Please review

## 2023-10-14 NOTE — Telephone Encounter (Signed)
Pt is upset that only a 30 day supply of Xarelto was sent in and wants to talk to nurse

## 2023-10-14 NOTE — Telephone Encounter (Signed)
I spoke with patient and explained that a cardiology appointment was needed in order to get 90 day supply of Xarelto.  I refilled his prescription to 60 tablets and his appointment is scheduled late January.

## 2023-10-15 ENCOUNTER — Other Ambulatory Visit: Payer: Self-pay | Admitting: Cardiovascular Disease

## 2023-10-15 DIAGNOSIS — I482 Chronic atrial fibrillation, unspecified: Secondary | ICD-10-CM

## 2023-11-06 ENCOUNTER — Emergency Department: Payer: Medicare HMO

## 2023-11-06 ENCOUNTER — Inpatient Hospital Stay
Admission: EM | Admit: 2023-11-06 | Discharge: 2023-11-08 | DRG: 872 | Disposition: A | Discharge status: Home / Self Care | Payer: Medicare HMO | Attending: Internal Medicine | Admitting: Internal Medicine

## 2023-11-06 ENCOUNTER — Encounter: Payer: Self-pay | Admitting: *Deleted

## 2023-11-06 ENCOUNTER — Other Ambulatory Visit: Payer: Self-pay

## 2023-11-06 DIAGNOSIS — M545 Low back pain, unspecified: Secondary | ICD-10-CM | POA: Diagnosis present

## 2023-11-06 DIAGNOSIS — Z7982 Long term (current) use of aspirin: Secondary | ICD-10-CM

## 2023-11-06 DIAGNOSIS — A419 Sepsis, unspecified organism: Principal | ICD-10-CM | POA: Diagnosis present

## 2023-11-06 DIAGNOSIS — N189 Chronic kidney disease, unspecified: Secondary | ICD-10-CM

## 2023-11-06 DIAGNOSIS — I482 Chronic atrial fibrillation, unspecified: Secondary | ICD-10-CM

## 2023-11-06 DIAGNOSIS — I130 Hypertensive heart and chronic kidney disease with heart failure and stage 1 through stage 4 chronic kidney disease, or unspecified chronic kidney disease: Secondary | ICD-10-CM | POA: Diagnosis present

## 2023-11-06 DIAGNOSIS — E1122 Type 2 diabetes mellitus with diabetic chronic kidney disease: Secondary | ICD-10-CM | POA: Diagnosis present

## 2023-11-06 DIAGNOSIS — N1831 Chronic kidney disease, stage 3a: Secondary | ICD-10-CM | POA: Diagnosis present

## 2023-11-06 DIAGNOSIS — G8929 Other chronic pain: Secondary | ICD-10-CM | POA: Diagnosis present

## 2023-11-06 DIAGNOSIS — R109 Unspecified abdominal pain: Secondary | ICD-10-CM | POA: Diagnosis not present

## 2023-11-06 DIAGNOSIS — E878 Other disorders of electrolyte and fluid balance, not elsewhere classified: Secondary | ICD-10-CM | POA: Diagnosis present

## 2023-11-06 DIAGNOSIS — L03115 Cellulitis of right lower limb: Principal | ICD-10-CM

## 2023-11-06 DIAGNOSIS — Z833 Family history of diabetes mellitus: Secondary | ICD-10-CM

## 2023-11-06 DIAGNOSIS — Z86718 Personal history of other venous thrombosis and embolism: Secondary | ICD-10-CM

## 2023-11-06 DIAGNOSIS — E785 Hyperlipidemia, unspecified: Secondary | ICD-10-CM

## 2023-11-06 DIAGNOSIS — E1140 Type 2 diabetes mellitus with diabetic neuropathy, unspecified: Secondary | ICD-10-CM | POA: Diagnosis present

## 2023-11-06 DIAGNOSIS — I4821 Permanent atrial fibrillation: Secondary | ICD-10-CM | POA: Diagnosis present

## 2023-11-06 DIAGNOSIS — Z7901 Long term (current) use of anticoagulants: Secondary | ICD-10-CM

## 2023-11-06 DIAGNOSIS — E861 Hypovolemia: Secondary | ICD-10-CM | POA: Diagnosis present

## 2023-11-06 DIAGNOSIS — I5022 Chronic systolic (congestive) heart failure: Secondary | ICD-10-CM | POA: Diagnosis not present

## 2023-11-06 DIAGNOSIS — Z8249 Family history of ischemic heart disease and other diseases of the circulatory system: Secondary | ICD-10-CM

## 2023-11-06 DIAGNOSIS — Z1152 Encounter for screening for COVID-19: Secondary | ICD-10-CM

## 2023-11-06 DIAGNOSIS — R944 Abnormal results of kidney function studies: Secondary | ICD-10-CM | POA: Diagnosis present

## 2023-11-06 DIAGNOSIS — Z79899 Other long term (current) drug therapy: Secondary | ICD-10-CM

## 2023-11-06 DIAGNOSIS — E871 Hypo-osmolality and hyponatremia: Secondary | ICD-10-CM

## 2023-11-06 DIAGNOSIS — E1165 Type 2 diabetes mellitus with hyperglycemia: Secondary | ICD-10-CM

## 2023-11-06 DIAGNOSIS — N179 Acute kidney failure, unspecified: Secondary | ICD-10-CM

## 2023-11-06 DIAGNOSIS — Z880 Allergy status to penicillin: Secondary | ICD-10-CM

## 2023-11-06 DIAGNOSIS — E872 Acidosis, unspecified: Secondary | ICD-10-CM | POA: Diagnosis present

## 2023-11-06 DIAGNOSIS — Z7984 Long term (current) use of oral hypoglycemic drugs: Secondary | ICD-10-CM

## 2023-11-06 DIAGNOSIS — Z95828 Presence of other vascular implants and grafts: Secondary | ICD-10-CM

## 2023-11-06 LAB — COMPREHENSIVE METABOLIC PANEL
ALT: 17 U/L (ref 0–44)
AST: 22 U/L (ref 15–41)
Albumin: 3.5 g/dL (ref 3.5–5.0)
Alkaline Phosphatase: 76 U/L (ref 38–126)
Anion gap: 16 — ABNORMAL HIGH (ref 5–15)
BUN: 33 mg/dL — ABNORMAL HIGH (ref 8–23)
CO2: 23 mmol/L (ref 22–32)
Calcium: 8.3 mg/dL — ABNORMAL LOW (ref 8.9–10.3)
Chloride: 90 mmol/L — ABNORMAL LOW (ref 98–111)
Creatinine, Ser: 1.78 mg/dL — ABNORMAL HIGH (ref 0.61–1.24)
GFR, Estimated: 40 mL/min — ABNORMAL LOW (ref >60)
Glucose, Bld: 298 mg/dL — ABNORMAL HIGH (ref 70–99)
Potassium: 4.2 mmol/L (ref 3.5–5.1)
Sodium: 129 mmol/L — ABNORMAL LOW (ref 135–145)
Total Bilirubin: 1.5 mg/dL — ABNORMAL HIGH (ref <1.2)
Total Protein: 6.7 g/dL (ref 6.5–8.1)

## 2023-11-06 LAB — LIPASE, BLOOD: Lipase: 27 U/L (ref 11–51)

## 2023-11-06 LAB — CBC
HCT: 48.5 % (ref 39.0–52.0)
Hemoglobin: 15.9 g/dL (ref 13.0–17.0)
MCH: 28.5 pg (ref 26.0–34.0)
MCHC: 32.8 g/dL (ref 30.0–36.0)
MCV: 86.9 fL (ref 80.0–100.0)
Platelets: 153 10*3/uL (ref 150–400)
RBC: 5.58 MIL/uL (ref 4.22–5.81)
RDW: 13.5 % (ref 11.5–15.5)
WBC: 12.1 10*3/uL — ABNORMAL HIGH (ref 4.0–10.5)
nRBC: 0 % (ref 0.0–0.2)

## 2023-11-06 LAB — RESP PANEL BY RT-PCR (RSV, FLU A&B, COVID)  RVPGX2
Influenza A by PCR: NEGATIVE
Influenza B by PCR: NEGATIVE
Resp Syncytial Virus by PCR: NEGATIVE
SARS Coronavirus 2 by RT PCR: NEGATIVE

## 2023-11-06 LAB — PROTIME-INR
INR: 1.4 — ABNORMAL HIGH (ref 0.8–1.2)
Prothrombin Time: 17 s — ABNORMAL HIGH (ref 11.4–15.2)

## 2023-11-06 LAB — TROPONIN I (HIGH SENSITIVITY): Troponin I (High Sensitivity): 16 ng/L (ref <18)

## 2023-11-06 MED ORDER — SODIUM CHLORIDE 0.9 % IV BOLUS (SEPSIS)
2000.0000 mL | Freq: Once | INTRAVENOUS | Status: AC
  Administered 2023-11-06: 2000 mL via INTRAVENOUS

## 2023-11-06 MED ORDER — SODIUM CHLORIDE 0.9 % IV SOLN
2.0000 g | Freq: Once | INTRAVENOUS | Status: AC
  Administered 2023-11-06: 2 g via INTRAVENOUS
  Filled 2023-11-06: qty 12.5

## 2023-11-06 MED ORDER — VANCOMYCIN HCL IN DEXTROSE 1-5 GM/200ML-% IV SOLN
1000.0000 mg | Freq: Once | INTRAVENOUS | Status: AC
  Administered 2023-11-07: 1000 mg via INTRAVENOUS
  Filled 2023-11-06: qty 200

## 2023-11-06 MED ORDER — METRONIDAZOLE 500 MG/100ML IV SOLN
500.0000 mg | Freq: Once | INTRAVENOUS | Status: AC
  Administered 2023-11-07: 500 mg via INTRAVENOUS
  Filled 2023-11-06: qty 100

## 2023-11-06 NOTE — ED Provider Notes (Addendum)
[facility] Provider Note    Event Date/Time   First MD Initiated Contact with Patient 11/06/23 2249     (approximate)   History   Abdominal Pain, Leg Pain, and Nausea   HPI  Jeff Levengood Roddy Laidig. is a 72 y.o. male history of diabetes A-fib presents to the ER for evaluation of generalized malaise for the past 3 days.  Feels like symptoms started when he was going to a restaurant started having dry heaves and malaise.  States that over the past 24 hours noticed some redness in his right lower extremity and has had issues with cellulitis on his right leg before.  Denies any abdominal pain.  No cough or congestion.  In triage found to be hypotensive and tachycardic.     Physical Exam   Triage Vital Signs: ED Triage Vitals  Encounter Vitals Group     BP 11/06/23 2230 (!) 81/59     Systolic BP Percentile --      Diastolic BP Percentile --      Pulse Rate 11/06/23 2230 (!) 120     Resp 11/06/23 2230 19     Temp 11/06/23 2230 98.8 F (37.1 C)     Temp src --      SpO2 11/06/23 2230 93 %     Weight 11/06/23 2228 270 lb (122.5 kg)     Height 11/06/23 2228 5\' 11"  (1.803 m)     Head Circumference --      Peak Flow --      Pain Score 11/06/23 2228 8     Pain Loc --      Pain Education --      Exclude from Growth Chart --     Most recent vital signs: Vitals:   11/06/23 2230 11/06/23 2239  BP: (!) 81/59 (!) 70/40  Pulse: (!) 120   Resp: 19   Temp: 98.8 F (37.1 C)   SpO2: 93%      Constitutional: Alert  Eyes: Conjunctivae are normal.  Head: Atraumatic. Nose: No congestion/rhinnorhea. Mouth/Throat: Mucous membranes are moist.   Neck: Painless ROM.  Cardiovascular:   Good peripheral circulation. Respiratory: Normal respiratory effort.  No retractions.  Gastrointestinal: Soft and nontender.  Musculoskeletal: Right lower extremity is warm to touch with cellulitic changes streaking proximally up to the knee.  Brisk cap refill distally.   Compartments are soft. Neurologic:  MAE spontaneously. No gross focal neurologic deficits are appreciated.  Skin:  Skin is warm, dry and intact. No rash noted. Psychiatric: Mood and affect are normal. Speech and behavior are normal.    ED Results / Procedures / Treatments   Labs (all labs ordered are listed, but only abnormal results are displayed) Labs Reviewed  COMPREHENSIVE METABOLIC PANEL - Abnormal; Notable for the following components:      Result Value   Sodium 129 (*)    Chloride 90 (*)    Glucose, Bld 298 (*)    BUN 33 (*)    Creatinine, Ser 1.78 (*)    Calcium 8.3 (*)    Total Bilirubin 1.5 (*)    GFR, Estimated 40 (*)    Anion gap 16 (*)    All other components within normal limits  CBC - Abnormal; Notable for the following components:   WBC 12.1 (*)    All other components within normal limits  PROTIME-INR - Abnormal; Notable for the following components:   Prothrombin Time 17.0 (*)    INR 1.4 (*)  All other components within normal limits  RESP PANEL BY RT-PCR (RSV, FLU A&B, COVID)  RVPGX2  CULTURE, BLOOD (ROUTINE X 2)  CULTURE, BLOOD (ROUTINE X 2)  LIPASE, BLOOD  URINALYSIS, ROUTINE W REFLEX MICROSCOPIC  LACTIC ACID, PLASMA  LACTIC ACID, PLASMA  APTT  TROPONIN I (HIGH SENSITIVITY)     EKG  ED ECG REPORT I, Willy Eddy, the attending physician, personally viewed and interpreted this ECG.   Date: 11/06/2023  EKG Time: 22:38  Rate: 115  Rhythm: afib with rvr  Axis: left  Intervals: normal  ST&T Change: nonspecific st abn    RADIOLOGY Please see ED Course for my review and interpretation.  I personally reviewed all radiographic images ordered to evaluate for the above acute complaints and reviewed radiology reports and findings.  These findings were personally discussed with the patient.  Please see medical record for radiology report.    PROCEDURES:  Critical Care performed: Yes, see critical care procedure note(s)  .Critical  Care  Performed by: Willy Eddy, MD Authorized by: Willy Eddy, MD   Critical care provider statement:    Critical care time (minutes):  35   Critical care was necessary to treat or prevent imminent or life-threatening deterioration of the following conditions:  Sepsis   Critical care was time spent personally by me on the following activities:  Ordering and performing treatments and interventions, ordering and review of laboratory studies, ordering and review of radiographic studies, pulse oximetry, re-evaluation of patient's condition, review of old charts, obtaining history from patient or surrogate, examination of patient, evaluation of patient's response to treatment, discussions with primary provider, discussions with consultants and development of treatment plan with patient or surrogate    MEDICATIONS ORDERED IN ED: Medications  sodium chloride 0.9 % bolus 2,000 mL (2,000 mLs Intravenous New Bag/Given 11/06/23 2343)  ceFEPIme (MAXIPIME) 2 g in sodium chloride 0.9 % 100 mL IVPB (2 g Intravenous New Bag/Given 11/06/23 2344)  metroNIDAZOLE (FLAGYL) IVPB 500 mg (has no administration in time range)  vancomycin (VANCOCIN) IVPB 1000 mg/200 mL premix (has no administration in time range)     IMPRESSION / MDM / ASSESSMENT AND PLAN / ED COURSE  I reviewed the triage vital signs and the nursing notes.                              Differential diagnosis includes, but is not limited to, sepsis, cellulitis, dehydration, electrolyte abnormality, A-fib, CHF, shock  Patient presenting to the ER for evaluation of symptoms as described above.  Based on symptoms, risk factors and considered above differential, this presenting complaint could reflect a potentially life-threatening illness therefore the patient will be placed on continuous pulse oximetry and telemetry for monitoring.  Laboratory evaluation will be sent to evaluate for the above complaints.  IV fluids will be ordered for  resuscitation.  Initial lactate is pending.  Broad-spectrum antibiotics ordered.  Consider this could be related to underlying cardiac etiology therefore we will hold off on 30 cc/kg fluid resuscitation until further differentiation.  Will be signed out to oncoming physician pending follow-up laboratory tests reassessment.  Patient will require hospitalization.        FINAL CLINICAL IMPRESSION(S) / ED DIAGNOSES   Final diagnoses:  Cellulitis of right lower extremity     Rx / DC Orders   ED Discharge Orders     None        Note:  This document was  prepared using Conservation officer, historic buildings and may include unintentional dictation errors.    Willy Eddy, MD 11/06/23 Arnette Schaumann    Willy Eddy, MD 11/06/23 [phone]

## 2023-11-06 NOTE — ED Triage Notes (Addendum)
Pt to triage via wheelchair.  Pt has abd pain, nausea , chills for 3 days.  Pt is diabetic, pt has redness and swelling and pain in right lower leg for 1 day.  No sob or chest pain.  Pt also reports a cough.  Pt alert speech clear.   Pt took 1gm tylenol at 2200

## 2023-11-06 NOTE — ED Notes (Addendum)
Pt to room 47 from triage.

## 2023-11-06 NOTE — Progress Notes (Signed)
CODE SEPSIS - PHARMACY COMMUNICATION  **Broad Spectrum Antibiotics should be administered within 1 hour of Sepsis diagnosis**  Time Code Sepsis Called/Page Received: 2301  Antibiotics Ordered: Cefepime, Flagyl, Vancomycin  Time of 1st antibiotic administration: 2344  Otelia Sergeant, PharmD, [facility] 11/06/2023 11:16 PM

## 2023-11-07 ENCOUNTER — Emergency Department: Payer: Medicare HMO

## 2023-11-07 DIAGNOSIS — Z7984 Long term (current) use of oral hypoglycemic drugs: Secondary | ICD-10-CM | POA: Diagnosis not present

## 2023-11-07 DIAGNOSIS — L0390 Cellulitis, unspecified: Secondary | ICD-10-CM

## 2023-11-07 DIAGNOSIS — N189 Chronic kidney disease, unspecified: Secondary | ICD-10-CM | POA: Diagnosis not present

## 2023-11-07 DIAGNOSIS — M545 Low back pain, unspecified: Secondary | ICD-10-CM | POA: Diagnosis not present

## 2023-11-07 DIAGNOSIS — L03115 Cellulitis of right lower limb: Secondary | ICD-10-CM | POA: Diagnosis not present

## 2023-11-07 DIAGNOSIS — Z8249 Family history of ischemic heart disease and other diseases of the circulatory system: Secondary | ICD-10-CM | POA: Diagnosis not present

## 2023-11-07 DIAGNOSIS — E1165 Type 2 diabetes mellitus with hyperglycemia: Secondary | ICD-10-CM

## 2023-11-07 DIAGNOSIS — E861 Hypovolemia: Secondary | ICD-10-CM | POA: Diagnosis not present

## 2023-11-07 DIAGNOSIS — E785 Hyperlipidemia, unspecified: Secondary | ICD-10-CM

## 2023-11-07 DIAGNOSIS — A419 Sepsis, unspecified organism: Secondary | ICD-10-CM | POA: Diagnosis not present

## 2023-11-07 DIAGNOSIS — I4821 Permanent atrial fibrillation: Secondary | ICD-10-CM | POA: Diagnosis not present

## 2023-11-07 DIAGNOSIS — M7989 Other specified soft tissue disorders: Secondary | ICD-10-CM | POA: Diagnosis not present

## 2023-11-07 DIAGNOSIS — E878 Other disorders of electrolyte and fluid balance, not elsewhere classified: Secondary | ICD-10-CM | POA: Diagnosis not present

## 2023-11-07 DIAGNOSIS — I482 Chronic atrial fibrillation, unspecified: Secondary | ICD-10-CM | POA: Diagnosis not present

## 2023-11-07 DIAGNOSIS — Z1152 Encounter for screening for COVID-19: Secondary | ICD-10-CM | POA: Diagnosis not present

## 2023-11-07 DIAGNOSIS — M79604 Pain in right leg: Secondary | ICD-10-CM | POA: Diagnosis not present

## 2023-11-07 DIAGNOSIS — R944 Abnormal results of kidney function studies: Secondary | ICD-10-CM | POA: Diagnosis not present

## 2023-11-07 DIAGNOSIS — N179 Acute kidney failure, unspecified: Secondary | ICD-10-CM | POA: Diagnosis not present

## 2023-11-07 DIAGNOSIS — E1122 Type 2 diabetes mellitus with diabetic chronic kidney disease: Secondary | ICD-10-CM | POA: Diagnosis not present

## 2023-11-07 DIAGNOSIS — E872 Acidosis, unspecified: Secondary | ICD-10-CM | POA: Diagnosis not present

## 2023-11-07 DIAGNOSIS — N1831 Chronic kidney disease, stage 3a: Secondary | ICD-10-CM | POA: Diagnosis not present

## 2023-11-07 DIAGNOSIS — E1140 Type 2 diabetes mellitus with diabetic neuropathy, unspecified: Secondary | ICD-10-CM | POA: Diagnosis not present

## 2023-11-07 DIAGNOSIS — E871 Hypo-osmolality and hyponatremia: Secondary | ICD-10-CM

## 2023-11-07 DIAGNOSIS — Z86718 Personal history of other venous thrombosis and embolism: Secondary | ICD-10-CM | POA: Diagnosis not present

## 2023-11-07 DIAGNOSIS — I130 Hypertensive heart and chronic kidney disease with heart failure and stage 1 through stage 4 chronic kidney disease, or unspecified chronic kidney disease: Secondary | ICD-10-CM | POA: Diagnosis not present

## 2023-11-07 DIAGNOSIS — I5022 Chronic systolic (congestive) heart failure: Secondary | ICD-10-CM | POA: Diagnosis not present

## 2023-11-07 DIAGNOSIS — Z7901 Long term (current) use of anticoagulants: Secondary | ICD-10-CM | POA: Diagnosis not present

## 2023-11-07 DIAGNOSIS — G8929 Other chronic pain: Secondary | ICD-10-CM | POA: Diagnosis not present

## 2023-11-07 DIAGNOSIS — Z95828 Presence of other vascular implants and grafts: Secondary | ICD-10-CM | POA: Diagnosis not present

## 2023-11-07 LAB — URINALYSIS, ROUTINE W REFLEX MICROSCOPIC
Bilirubin Urine: NEGATIVE
Glucose, UA: >=500 mg/dL — AB
Ketones, ur: 20 mg/dL — AB
Leukocytes,Ua: NEGATIVE
Nitrite: NEGATIVE
Protein, ur: 30 mg/dL — AB
RBC / HPF: 0 RBC/hpf (ref 0–5)
Specific Gravity, Urine: 1.022 (ref 1.005–1.030)
Squamous Epithelial / HPF: 0 /[HPF] (ref 0–5)
pH: 5 (ref 5.0–8.0)

## 2023-11-07 LAB — CORTISOL-AM, BLOOD: Cortisol - AM: 10.6 ug/dL (ref 6.7–22.6)

## 2023-11-07 LAB — CBC
HCT: 43.5 % (ref 39.0–52.0)
Hemoglobin: 14.1 g/dL (ref 13.0–17.0)
MCH: 28.4 pg (ref 26.0–34.0)
MCHC: 32.4 g/dL (ref 30.0–36.0)
MCV: 87.7 fL (ref 80.0–100.0)
Platelets: 125 10*3/uL — ABNORMAL LOW (ref 150–400)
RBC: 4.96 MIL/uL (ref 4.22–5.81)
RDW: 13.7 % (ref 11.5–15.5)
WBC: 11.5 10*3/uL — ABNORMAL HIGH (ref 4.0–10.5)
nRBC: 0 % (ref 0.0–0.2)

## 2023-11-07 LAB — APTT: aPTT: 30 s (ref 24–36)

## 2023-11-07 LAB — BASIC METABOLIC PANEL
Anion gap: 15 (ref 5–15)
BUN: 33 mg/dL — ABNORMAL HIGH (ref 8–23)
CO2: 19 mmol/L — ABNORMAL LOW (ref 22–32)
Calcium: 7.5 mg/dL — ABNORMAL LOW (ref 8.9–10.3)
Chloride: 97 mmol/L — ABNORMAL LOW (ref 98–111)
Creatinine, Ser: 1.55 mg/dL — ABNORMAL HIGH (ref 0.61–1.24)
GFR, Estimated: 47 mL/min — ABNORMAL LOW (ref >60)
Glucose, Bld: 284 mg/dL — ABNORMAL HIGH (ref 70–99)
Potassium: 4.3 mmol/L (ref 3.5–5.1)
Sodium: 131 mmol/L — ABNORMAL LOW (ref 135–145)

## 2023-11-07 LAB — CBG MONITORING, ED
Glucose-Capillary: 258 mg/dL — ABNORMAL HIGH (ref 70–99)
Glucose-Capillary: 108 mg/dL — ABNORMAL HIGH (ref 70–99)
Glucose-Capillary: 124 mg/dL — ABNORMAL HIGH (ref 70–99)
Glucose-Capillary: 97 mg/dL (ref 70–99)

## 2023-11-07 LAB — LACTIC ACID, PLASMA
Lactic Acid, Venous: 1.4 mmol/L (ref 0.5–1.9)
Lactic Acid, Venous: 2.3 mmol/L (ref 0.5–1.9)

## 2023-11-07 LAB — TROPONIN I (HIGH SENSITIVITY): Troponin I (High Sensitivity): 15 ng/L (ref <18)

## 2023-11-07 LAB — PROTIME-INR
INR: 1.2 (ref 0.8–1.2)
Prothrombin Time: 15.6 s — ABNORMAL HIGH (ref 11.4–15.2)

## 2023-11-07 MED ORDER — ALBUTEROL SULFATE HFA 108 (90 BASE) MCG/ACT IN AERS: 2.0000 | INHALATION_SPRAY | RESPIRATORY_TRACT | Status: DC | PRN

## 2023-11-07 MED ORDER — METOPROLOL TARTRATE 50 MG PO TABS
50.0000 mg | ORAL_TABLET | Freq: Two times a day (BID) | ORAL | Status: DC
  Administered 2023-11-07 (×2): 50 mg via ORAL
  Filled 2023-11-07 (×2): qty 2

## 2023-11-07 MED ORDER — ACETAMINOPHEN 325 MG PO TABS
650.0000 mg | ORAL_TABLET | Freq: Four times a day (QID) | ORAL | Status: DC | PRN
  Administered 2023-11-07 (×2): 650 mg via ORAL
  Filled 2023-11-07 (×2): qty 2

## 2023-11-07 MED ORDER — FUROSEMIDE 40 MG PO TABS: 20.0000 mg | ORAL_TABLET | Freq: Every day | ORAL | Status: DC

## 2023-11-07 MED ORDER — ACETAMINOPHEN 325 MG RE SUPP: 650.0000 mg | Freq: Four times a day (QID) | RECTAL | Status: DC | PRN

## 2023-11-07 MED ORDER — EMPAGLIFLOZIN 25 MG PO TABS
25.0000 mg | ORAL_TABLET | Freq: Every day | ORAL | Status: DC
  Administered 2023-11-07 – 2023-11-08 (×2): 25 mg via ORAL
  Filled 2023-11-07 (×3): qty 1

## 2023-11-07 MED ORDER — ALBUTEROL SULFATE (2.5 MG/3ML) 0.083% IN NEBU: 2.5000 mg | INHALATION_SOLUTION | RESPIRATORY_TRACT | Status: DC | PRN

## 2023-11-07 MED ORDER — MAGNESIUM HYDROXIDE 400 MG/5ML PO SUSP: 30.0000 mL | Freq: Every day | ORAL | Status: DC | PRN

## 2023-11-07 MED ORDER — VANCOMYCIN HCL 1500 MG/300ML IV SOLN
1500.0000 mg | INTRAVENOUS | Status: DC
  Administered 2023-11-07 – 2023-11-08 (×2): 1500 mg via INTRAVENOUS
  Filled 2023-11-07 (×2): qty 300

## 2023-11-07 MED ORDER — LACTATED RINGERS IV SOLN
150.0000 mL/h | INTRAVENOUS | Status: DC
  Administered 2023-11-07: 150 mL/h via INTRAVENOUS

## 2023-11-07 MED ORDER — LABETALOL HCL 5 MG/ML IV SOLN: 10.0000 mg | INTRAVENOUS | Status: DC | PRN

## 2023-11-07 MED ORDER — DOXAZOSIN MESYLATE 2 MG PO TABS
2.0000 mg | ORAL_TABLET | Freq: Two times a day (BID) | ORAL | Status: DC
  Administered 2023-11-07 (×2): 2 mg via ORAL
  Filled 2023-11-07 (×3): qty 1

## 2023-11-07 MED ORDER — HYDRALAZINE HCL 20 MG/ML IJ SOLN: 10.0000 mg | Freq: Four times a day (QID) | INTRAMUSCULAR | Status: DC | PRN

## 2023-11-07 MED ORDER — SODIUM CHLORIDE 0.9 % IV SOLN
2.0000 g | INTRAVENOUS | Status: DC
  Administered 2023-11-07 – 2023-11-08 (×2): 2 g via INTRAVENOUS
  Filled 2023-11-07 (×2): qty 20

## 2023-11-07 MED ORDER — ATORVASTATIN CALCIUM 20 MG PO TABS
10.0000 mg | ORAL_TABLET | Freq: Every day | ORAL | Status: DC
  Administered 2023-11-07 – 2023-11-08 (×2): 10 mg via ORAL
  Filled 2023-11-07 (×2): qty 1

## 2023-11-07 MED ORDER — ONDANSETRON HCL 4 MG PO TABS: 4.0000 mg | ORAL_TABLET | Freq: Four times a day (QID) | ORAL | Status: DC | PRN

## 2023-11-07 MED ORDER — FLUTICASONE PROPIONATE 50 MCG/ACT NA SUSP: 2.0000 | Freq: Every day | NASAL | Status: DC | PRN

## 2023-11-07 MED ORDER — TRAZODONE HCL 50 MG PO TABS: 25.0000 mg | ORAL_TABLET | Freq: Every evening | ORAL | Status: DC | PRN

## 2023-11-07 MED ORDER — FUROSEMIDE 40 MG PO TABS
20.0000 mg | ORAL_TABLET | Freq: Every day | ORAL | Status: DC
Start: 2023-11-07 — End: 2023-11-08
  Administered 2023-11-07: 20 mg via ORAL
  Filled 2023-11-07 (×2): qty 1

## 2023-11-07 MED ORDER — GABAPENTIN 100 MG PO CAPS
100.0000 mg | ORAL_CAPSULE | Freq: Three times a day (TID) | ORAL | Status: DC
Start: 2023-11-07 — End: 2023-11-08
  Administered 2023-11-07 – 2023-11-08 (×4): 100 mg via ORAL
  Filled 2023-11-07 (×4): qty 1

## 2023-11-07 MED ORDER — SODIUM CHLORIDE 0.9 % IV BOLUS
1000.0000 mL | Freq: Once | INTRAVENOUS | Status: AC
  Administered 2023-11-07: 1000 mL via INTRAVENOUS

## 2023-11-07 MED ORDER — ASPIRIN 325 MG PO TBEC
325.0000 mg | DELAYED_RELEASE_TABLET | Freq: Every day | ORAL | Status: DC
Start: 2023-11-07 — End: 2023-11-08
  Administered 2023-11-07 – 2023-11-08 (×2): 325 mg via ORAL
  Filled 2023-11-07 (×2): qty 1

## 2023-11-07 MED ORDER — RIVAROXABAN 20 MG PO TABS
20.0000 mg | ORAL_TABLET | Freq: Every day | ORAL | Status: DC
  Administered 2023-11-07 – 2023-11-08 (×2): 20 mg via ORAL
  Filled 2023-11-07 (×2): qty 1

## 2023-11-07 MED ORDER — INSULIN ASPART 100 UNIT/ML IJ SOLN
0.0000 [IU] | Freq: Three times a day (TID) | INTRAMUSCULAR | Status: DC
  Administered 2023-11-07: 11 [IU] via SUBCUTANEOUS
  Administered 2023-11-07: 3 [IU] via SUBCUTANEOUS
  Administered 2023-11-07: 6 [IU] via SUBCUTANEOUS
  Administered 2023-11-08: 3 [IU] via SUBCUTANEOUS
  Filled 2023-11-07 (×4): qty 1

## 2023-11-07 MED ORDER — BENAZEPRIL HCL 20 MG PO TABS
20.0000 mg | ORAL_TABLET | Freq: Every day | ORAL | Status: DC
  Filled 2023-11-07: qty 1

## 2023-11-07 MED ORDER — ONDANSETRON HCL 4 MG/2ML IJ SOLN: 4.0000 mg | Freq: Four times a day (QID) | INTRAMUSCULAR | Status: DC | PRN

## 2023-11-07 MED ORDER — INSULIN ASPART 100 UNIT/ML IJ SOLN: 0.0000 [IU] | Freq: Every day | INTRAMUSCULAR | Status: DC

## 2023-11-07 NOTE — Assessment & Plan Note (Signed)
-   We will continue Xarelto and aspirin. - We will continue Lopressor. - His rate is being controlled with hydration.

## 2023-11-07 NOTE — ED Provider Notes (Signed)
-----------------------------------------   1:40 AM on 11/07/2023 ----------------------------------------- Patient's ultrasound is negative for DVT.  Lab work shows slight leukocytosis of 12,000, chemistry shows mild renal insufficiency with anion gap of 16 consistent with dehydration.  Patient receiving IV fluids.  Lactic acid slightly elevated at 2.3 but again receiving IV fluids.  Patient's troponin is negative, COVID/flu/RSV is negative.  Chest x-ray is clear.  Patient has significant erythema of the right lower extremity likely the source of the patient's septic presentation.  Blood pressure remains borderline low.  Patient will be admitted to the hospital service for further workup and treatment.   Minna Antis, MD 11/07/23 [phone]

## 2023-11-07 NOTE — ED Notes (Signed)
Pt used urinal independently all shift

## 2023-11-07 NOTE — Progress Notes (Signed)
PROGRESS NOTE    Jeff [name].  ZOX:[id number] DOB: 07-08-1951 DOA: 11/06/2023 PCP: Dorcas Carrow, DO  Chief Complaint  Patient presents with   Abdominal Pain   Leg Pain   Nausea    Hospital Course:  Jeff [name]. is 72 y.o. male with heart failure, diabetes, hypertension, dyslipidemia, chronic A-fib, chronic DVT, who presented to the ER with acute onset right lower extremity pain and swelling.  Patient also endorsed fever at home.  He denies any recent trauma to the area.  He does endorse that his heels have recently been cracking. Within the ED initial blood pressure was 81/59 with tachycardia.  It improved with hydration.  CBC revealed leukocytosis of 12.1.  Lactic acidosis resolved on repeat.  EKG in ED revealed A-fib with RVR, lower extremity Jeff [name] negative for DVT.  Patient was admitted on IV cefepime, vancomycin, Flagyl.  He received 3 L normal saline in the ED.  Subjective: Patient reports his pain is mildly improved.  Leg is still significantly tender to palpation and erythematous.   Objective: Vitals:   11/07/23 0500 11/07/23 0523 11/07/23 0600 11/07/23 0921  BP: 112/88 112/88 118/80 132/87  Pulse: 93 90 (!) 104 94  Resp:  16 16 (!) 22  Temp:  98.4 F (36.9 C)  97.8 F (36.6 C)  TempSrc:  Oral  Oral  SpO2: 100% 100% 100% 99%  Weight:      Height:        Intake/Output Summary (Last 24 hours) at 11/07/2023 1304 Last data filed at 11/07/2023 1303 Gross per 24 hour  Intake 3399.1 ml  Output 1525 ml  Net 1874.1 ml   Filed Weights   11/06/23 2228  Weight: 122.5 kg    Examination: General exam: Appears calm and comfortable, NAD  Respiratory system: No work of breathing, symmetric chest wall expansion Cardiovascular system: S1 & S2 heard, RRR.  Gastrointestinal system: Abdomen is nondistended, soft and nontender.  Neuro: Alert and oriented. No focal neurological deficits. Extremities: Symmetric, expected ROM Skin: Right leg significantly  erythematous, diffusely tender to palpation, 1+ pitting edema, erythema circumferential.  No open lesions weeping, or bleeding.  Bilateral heels with dry skin and skin cracking.  No erythema or oozing from these areas. Psychiatry: Demonstrates appropriate judgement and insight. Mood & affect appropriate for situation.   Assessment & Plan:  Principal Problem:   Sepsis due to cellulitis [facility]) Active Problems:   Chronic atrial fibrillation with RVR (HCC)   Uncontrolled type 2 diabetes mellitus with hyperglycemia (HCC)   Acute kidney injury superimposed on chronic kidney disease (HCC)   Hyponatremia   Dyslipidemia  Sepsis secondary to cellulitis of right lower extremity - Sepsis criteria met on arrival: Leukocytosis, tachycardia, tachypnea - Status post 3 L IV fluids in the ED.  Will hold off on further IV fluids given history of heart failure.  Patient does appear that he is getting volume overloaded in the lower extremities. - Continue ceftriaxone and vancomycin for now.  MRSA PCR has been ordered and pending.  Can likely further de-escalate results - Follow blood cultures, currently negative to date - Prn pain meds  Chronic atrial fibrillation with RVR - Home dose Xarelto and aspirin resumed - Continue metoprolol - Currently rate controlled status post rehydration  Uncontrolled type 2 diabetes with hyperglycemia - Continue with sliding scale insulin, titrate up as tolerated - Hold metformin while admitted - Continue home dose gabapentin  AKI superimposed on CKD 3A - Creatinine baseline appears close  to 1.3, was 1.78 on arrival. - Increase likely secondary to sepsis as above - Continue to trend CMP - Creatinine is resolving towards baseline this morning  Hyponatremia - Likely secondary to hypovolemia - Improving on repeat - Continue to trend CMP in a.m.  Dyslipidemia - Continue home dose statin  Heart failure with reduced EF -- Last echo 2019: LVEF 40-45%  -- Cont home  meds, GDMT as BP tolerates        DVT prophylaxis: Xarelto   Code Status: Full Code Family Communication: discussed directly with patient Disposition:  Status is: Inpatient, ongoing IV antibiotics     Consultants:      Procedures:    Antimicrobials:  Anti-infectives (From admission, onward)    Start     Dose/Rate Route Frequency Ordered Stop   11/07/23 1000  cefTRIAXone (ROCEPHIN) 2 g in sodium chloride 0.9 % 100 mL IVPB        2 g 200 mL/hr over 30 Minutes Intravenous Every 24 hours 11/07/23 0316 11/14/23 0959   11/07/23 0600  vancomycin (VANCOREADY) IVPB 1500 mg/300 mL        1,500 mg 150 mL/hr over 120 Minutes Intravenous Every 24 hours 11/07/23 0333     11/06/23 2300  ceFEPIme (MAXIPIME) 2 g in sodium chloride 0.9 % 100 mL IVPB        2 g 200 mL/hr over 30 Minutes Intravenous  Once 11/06/23 2259 11/07/23 0103   11/06/23 2300  metroNIDAZOLE (FLAGYL) IVPB 500 mg        500 mg 100 mL/hr over 60 Minutes Intravenous  Once 11/06/23 2259 11/07/23 0103   11/06/23 2300  vancomycin (VANCOCIN) IVPB 1000 mg/200 mL premix        1,000 mg 200 mL/hr over 60 Minutes Intravenous  Once 11/06/23 2259 11/07/23 0110       Data Reviewed: I have personally reviewed following labs and imaging studies CBC: Recent Labs  Lab 11/06/23 2232 11/07/23 0500  WBC 12.1* 11.5*  HGB 15.9 14.1  HCT 48.5 43.5  MCV 86.9 87.7  PLT 153 125*   Basic Metabolic Panel: Recent Labs  Lab 11/06/23 2232 11/07/23 0500  NA 129* 131*  K 4.2 4.3  CL 90* 97*  CO2 23 19*  GLUCOSE 298* 284*  BUN 33* 33*  CREATININE 1.78* 1.55*  CALCIUM 8.3* 7.5*   GFR: Estimated Creatinine Clearance: 57.4 mL/min (A) (by C-G formula based on SCr of 1.55 mg/dL (H)). Liver Function Tests: Recent Labs  Lab 11/06/23 2232  AST 22  ALT 17  ALKPHOS 76  BILITOT 1.5*  PROT 6.7  ALBUMIN 3.5   CBG: Recent Labs  Lab 11/07/23 0739 11/07/23 1138  GLUCAP 258* 108*    Recent Results (from the past 240 hours)   Resp panel by RT-PCR (RSV, Flu A&B, Covid) Anterior Nasal Swab     Status: None   Collection Time: 11/06/23 10:32 PM   Specimen: Anterior Nasal Swab  Result Value Ref Range Status   SARS Coronavirus 2 by RT PCR NEGATIVE NEGATIVE Final    Comment: (NOTE) SARS-CoV-2 target nucleic acids are NOT DETECTED.  The SARS-CoV-2 RNA is generally detectable in upper respiratory specimens during the acute phase of infection. The lowest concentration of SARS-CoV-2 viral copies this assay can detect is 138 copies/mL. A negative result does not preclude SARS-Cov-2 infection and should not be used as the sole basis for treatment or other patient management decisions. A negative result may occur with  improper specimen collection/handling, submission  of specimen other than nasopharyngeal swab, presence of viral mutation(s) within the areas targeted by this assay, and inadequate number of viral copies(<138 copies/mL). A negative result must be combined with clinical observations, patient history, and epidemiological information. The expected result is Negative.  Fact Sheet for Patients:  BloggerCourse.com  Fact Sheet for Healthcare Providers:  SeriousBroker.it  This test is no t yet approved or cleared by the [place] FDA and  has been authorized for detection and/or diagnosis of SARS-CoV-2 by FDA under an Emergency Use Authorization (EUA). This EUA will remain  in effect (meaning this test can be used) for the duration of the COVID-19 declaration under Section 564(b)(1) of the Act, 21 U.S.C.section 360bbb-3(b)(1), unless the authorization is terminated  or revoked sooner.       Influenza A by PCR NEGATIVE NEGATIVE Final   Influenza B by PCR NEGATIVE NEGATIVE Final    Comment: (NOTE) The Xpert Xpress SARS-CoV-2/FLU/RSV plus assay is intended as an aid in the diagnosis of influenza from Nasopharyngeal swab specimens and should not be used  as a sole basis for treatment. Nasal washings and aspirates are unacceptable for Xpert Xpress SARS-CoV-2/FLU/RSV testing.  Fact Sheet for Patients: BloggerCourse.com  Fact Sheet for Healthcare Providers: SeriousBroker.it  This test is not yet approved or cleared by the [place] FDA and has been authorized for detection and/or diagnosis of SARS-CoV-2 by FDA under an Emergency Use Authorization (EUA). This EUA will remain in effect (meaning this test can be used) for the duration of the COVID-19 declaration under Section 564(b)(1) of the Act, 21 U.S.C. section 360bbb-3(b)(1), unless the authorization is terminated or revoked.     Resp Syncytial Virus by PCR NEGATIVE NEGATIVE Final    Comment: (NOTE) Fact Sheet for Patients: BloggerCourse.com  Fact Sheet for Healthcare Providers: SeriousBroker.it  This test is not yet approved or cleared by the [place] FDA and has been authorized for detection and/or diagnosis of SARS-CoV-2 by FDA under an Emergency Use Authorization (EUA). This EUA will remain in effect (meaning this test can be used) for the duration of the COVID-19 declaration under Section 564(b)(1) of the Act, 21 U.S.C. section 360bbb-3(b)(1), unless the authorization is terminated or revoked.  Performed at [facility], [address] [place], [place] [address]   Blood Culture (routine x 2)     Status: None (Preliminary result)   Collection Time: 11/06/23 11:43 PM   Specimen: BLOOD  Result Value Ref Range Status   Specimen Description BLOOD RIGTH ARM  Final   Special Requests   Final    BOTTLES DRAWN AEROBIC AND ANAEROBIC Blood Culture results may not be optimal due to an inadequate volume of blood received in culture bottles   Culture   Final    NO GROWTH < 12 HOURS Performed at [facility], [address], [place], [place] [address]     Report Status PENDING  Incomplete  Blood Culture (routine x 2)     Status: None (Preliminary result)   Collection Time: 11/06/23 11:43 PM   Specimen: BLOOD  Result Value Ref Range Status   Specimen Description BLOOD LEFT ARM  Final   Special Requests   Final    BOTTLES DRAWN AEROBIC AND ANAEROBIC Blood Culture adequate volume   Culture   Final    NO GROWTH < 12 HOURS Performed at [facility], [address], [place], [place] [address]    Report Status PENDING  Incomplete     Radiology Studies: US  Venous Img Lower Unilateral Right Result Date: 11/07/2023 CLINICAL DATA:  Right leg pain and swelling, initial encounter EXAM: RIGHT LOWER EXTREMITY VENOUS Jeff [name] ULTRASOUND TECHNIQUE: Gray-scale sonography with graded compression, as well as color Jeff [name] and duplex ultrasound were performed to evaluate the lower extremity deep venous systems from the level of the common femoral vein and including the common femoral, femoral, profunda femoral, popliteal and calf veins including the posterior tibial, peroneal and gastrocnemius veins when visible. The superficial great saphenous vein was also interrogated. Spectral Jeff [name] was utilized to evaluate flow at rest and with distal augmentation maneuvers in the common femoral, femoral and popliteal veins. COMPARISON:  None Available. FINDINGS: Contralateral Common Femoral Vein: Respiratory phasicity is normal and symmetric with the symptomatic side. No evidence of thrombus. Normal compressibility. Common Femoral Vein: No evidence of thrombus. Normal compressibility, respiratory phasicity and response to augmentation. Saphenofemoral Junction: No evidence of thrombus. Normal compressibility and flow on color Jeff [name] imaging. Profunda Femoral Vein: No evidence of thrombus. Normal compressibility and flow on color Jeff [name] imaging. Femoral Vein: No evidence of thrombus. Normal compressibility, respiratory phasicity and response to augmentation.  Popliteal Vein: No evidence of thrombus. Normal compressibility, respiratory phasicity and response to augmentation. Calf Veins: No evidence of thrombus. Normal compressibility and flow on color Jeff [name] imaging. Superficial Great Saphenous Vein: No evidence of thrombus. Normal compressibility. Venous Reflux:  None. Other Findings: Reactive lymph nodes are noted in the right inguinal region. IMPRESSION: No evidence of deep venous thrombosis. Electronically Signed   By: Alcide Clever M.D.   On: 11/07/2023 01:28   DG Chest 2 View Result Date: 11/06/2023 CLINICAL DATA:  abdominal pain EXAM: CHEST - 2 VIEW COMPARISON:  None Available. FINDINGS: The heart size and mediastinal contours are within normal limits. Both lungs are clear. The visualized skeletal structures are unremarkable. IMPRESSION: No active cardiopulmonary disease. Electronically Signed   By: Deatra Robinson M.D.   On: 11/06/2023 23:41    Scheduled Meds:  atorvastatin  10 mg Oral Daily   doxazosin  2 mg Oral BID   empagliflozin  25 mg Oral QAC breakfast   gabapentin  100 mg Oral TID   insulin aspart  0-20 Units Subcutaneous TID WC   insulin aspart  0-5 Units Subcutaneous QHS   metoprolol tartrate  50 mg Oral BID   rivaroxaban  20 mg Oral Daily   Continuous Infusions:  cefTRIAXone (ROCEPHIN)  IV 2 g (11/07/23 0927)   lactated ringers 150 mL/hr (11/07/23 0533)   vancomycin Stopped (11/07/23 0745)     LOS: 0 days    Time spent:   Debarah Crape, DO [facility]  To contact the attending physician between 7A-7P please use Epic Chat. To contact the covering physician during after hours 7P-7A, please review Amion.   11/07/2023, 1:04 PM   *This document has been created with the assistance of dictation software. Please excuse typographical errors. *

## 2023-11-07 NOTE — Progress Notes (Signed)
Pharmacy Antibiotic Note  Jeff [name]. is a 72 y.o. male admitted on 11/06/2023 with cellulitis.  Pharmacy has been consulted for Vancomycin dosing.  Plan: Pt given Vancomycin 1000 mg once. Vancomycin 1500 mg IV Q 24 hrs. Goal AUC 400-550. Expected AUC: 452.8 SCr used: 1.78  Pharmacy will continue to follow and will adjust abx dosing whenever warranted.  Temp (24hrs), Avg:98.8 F (37.1 C), Min:98.8 F (37.1 C), Max:98.8 F (37.1 C)   Recent Labs  Lab 11/06/23 2232 11/06/23 2343 11/07/23 0100  WBC 12.1*  --   --   CREATININE 1.78*  --   --   LATICACIDVEN  --  2.3* 1.4    Estimated Creatinine Clearance: 50 mL/min (A) (by C-G formula based on SCr of 1.78 mg/dL (H)).    Allergies  Allergen Reactions   Penicillins     Antimicrobials this admission: 12/29 Cefepime >> x 1 dose 12/30 Ceftriaxone >> x 7 doses 12/30 Vancomycin >>   Microbiology results: 12/29 BCx: Pending  Thank you for allowing pharmacy to be a part of this patient's care.  Otelia Sergeant, PharmD, [facility] 11/07/2023 3:34 AM

## 2023-11-07 NOTE — Assessment & Plan Note (Addendum)
Will resume statin therapy. ? ?

## 2023-11-07 NOTE — Assessment & Plan Note (Signed)
-   The patient will be placed on supplement coverage with NovoLog. - We will continue Jardiance and hold off metformin. - We will continue Neurontin for peripheral neuropathy.

## 2023-11-07 NOTE — H&P (Addendum)
[place]   PATIENT NAME: Jeff [name]    MR#:  [id number]  DATE OF BIRTH:  04-28-1951  DATE OF ADMISSION:  11/06/2023  PRIMARY CARE PHYSICIAN: Dorcas Carrow, DO   Patient is coming from: Home  REQUESTING/REFERRING PHYSICIAN: Minna Antis, MD  CHIEF COMPLAINT:   Chief Complaint  Patient presents with   Abdominal Pain   Leg Pain   Nausea    HISTORY OF PRESENT ILLNESS:  Jeff Stewardson. is a 72 y.o. male with medical history significant for chronic systolic CHF, type diabetes mellitus, hypertension, dyslipidemia, chronic atrial fibrillation and chronic DVT, who presented to the ER with acute onset of right lower extremity pain with associated swelling and erythema that started on Friday.  Significantly worse on Sunday.  He admitted to nausea with dry heaves.  He has been having fever and mild chills.  His Tmax was 100.8.  No recent trauma or injuries he does not recall any insect bites.  He denied any cough or wheezing or hemoptysis.  No chest pain or palpitations.  No bleeding diathesis. . ED Course: When he came to the ER, BP was 81/59 with heart rate 120 and later on BP was 120/71 with hydration and heart rate of 88 with respiratory rate of 21.  Labs revealed mild hyponatremia and hypochloremia, hyperglycemia of 298, elevated BUN and creatinine of 33/1.78 compared to 24/1.3 on 08/19/2023.  CBC showed leukocytosis 12.1.  Lactic acid was 2.3 and later 1.4 INR is 1.4 and PT 17.    EKG as reviewed by me : EKG showed atrial fibrillation with RVR 114, left axis deviation and Q waves anteroseptally. Imaging: Two-view chest x-ray showed no acute cardiopulmonary disease.  Right lower extremity ultrasound showed no DVT.  The patient was given IV cefepime, vancomycin and Flagyl and 3 L bolus of IV normal saline.  He will be admitted to a progressive unit bed for further evaluation and management. PAST MEDICAL HISTORY:   Past Medical History:  Diagnosis Date   Chronic  deep vein thrombosis (DVT) (HCC)    Chronic low back pain    Chronic systolic heart failure (HCC)    a. TTE 2011 with EF 40-45% per notes   Diabetes mellitus without complication (HCC)    Erectile dysfunction    HLD (hyperlipidemia)    Hypertension    Hypogonadism in male    Permanent atrial fibrillation (HCC)    a. s/p DCCV 2011; b. redeveloped Afib in 2016; c. CHADS2VASc at least 5 (CHF, HTN, age x 1, DM, vascular disease); d. on Xarelto   Torn meniscus    right knee    PAST SURGICAL HISTORY:   Past Surgical History:  Procedure Laterality Date   COLONOSCOPY WITH PROPOFOL N/A 05/28/2016   Procedure: COLONOSCOPY WITH PROPOFOL;  Surgeon: Midge Minium, MD;  Location: [facility];  Service: Endoscopy;  Laterality: N/A;  diabetic - oral meds   DVT, leg Left    INSERTION OF VENA CAVA FILTER  2014   KNEE ARTHROSCOPY WITH MEDIAL MENISECTOMY Right 05/31/2019   Procedure: KNEE ARTHROSCOPY WITH partial  MEDIAL MENISECTOMY;  Surgeon: Signa Kell, MD;  Location: [facility];  Service: Orthopedics;  Laterality: Right;   LOWER EXTREMITY VENOGRAPHY Left 08/21/2021   Procedure: LOWER EXTREMITY VENOGRAPHY;  Surgeon: Renford Dills, MD;  Location: [facility] INVASIVE CV [facility];  Service: Cardiovascular;  Laterality: Left;  Possible IVC Filter Insertion   PERIPHERAL VASCULAR THROMBECTOMY Left 12/01/2017   Procedure: PERIPHERAL  VASCULAR THROMBECTOMY;  Surgeon: Renford Dills, MD;  Location: [facility] INVASIVE CV LAB;  Service: Cardiovascular;  Laterality: Left;    SOCIAL HISTORY:   Social History   Tobacco Use   Smoking status: Never   Smokeless tobacco: Never  Substance Use Topics   Alcohol use: No    Alcohol/week: 0.0 standard drinks of alcohol    FAMILY HISTORY:   Family History  Problem Relation Age of Onset   Lymphoma Mother    Heart failure Father    Diabetes Father    Atrial fibrillation Brother    Diabetes Mellitus II Other    Heart attack Neg Hx    Hypertension Neg Hx     Cancer Neg Hx    COPD Neg Hx    Stroke Neg Hx    Prostate cancer Neg Hx    Kidney cancer Neg Hx    Bladder Cancer Neg Hx     DRUG ALLERGIES:   Allergies  Allergen Reactions   Penicillins     REVIEW OF SYSTEMS:   ROS As per history of present illness. All pertinent systems were reviewed above. Constitutional, HEENT, cardiovascular, respiratory, GI, GU, musculoskeletal, neuro, psychiatric, endocrine, integumentary and hematologic systems were reviewed and are otherwise negative/unremarkable except for positive findings mentioned above in the HPI.   MEDICATIONS AT HOME:   Prior to Admission medications   Medication Sig Start Date End Date Taking? Authorizing Provider  albuterol (VENTOLIN HFA) 108 (90 Base) MCG/ACT inhaler Inhale 2 puffs into the lungs every 4 (four) hours as needed for wheezing or shortness of breath. 08/19/23   Johnson, Megan P, DO  Aspirin 325 MG CAPS Take by mouth.    [provider]  atorvastatin (LIPITOR) 20 MG tablet Take 0.5 tablets (10 mg total) by mouth daily. 08/19/23   Johnson, Megan P, DO  benazepril (LOTENSIN) 20 MG tablet Take 1 tablet (20 mg total) by mouth daily. 08/19/23   Johnson, Megan P, DO  doxazosin (CARDURA) 2 MG tablet Take 1 tablet (2 mg total) by mouth 2 (two) times daily. 08/19/23   Johnson, Megan P, DO  empagliflozin (JARDIANCE) 25 MG TABS tablet Take 1 tablet (25 mg total) by mouth daily before breakfast. 08/21/23   Johnson, Megan P, DO  fluticasone (FLONASE) 50 MCG/ACT nasal spray Place 2 sprays into both nostrils daily as needed for allergies or rhinitis. 08/19/23   Johnson, Megan P, DO  furosemide (LASIX) 20 MG tablet Take 1 tablet (20 mg total) by mouth daily. 08/19/23   Johnson, Megan P, DO  gabapentin (NEURONTIN) 100 MG capsule Take 1 capsule (100 mg total) by mouth 3 (three) times daily. 08/19/23   Johnson, Megan P, DO  metFORMIN (GLUCOPHAGE-XR) 500 MG 24 hr tablet Take 2 tablets (1,000 mg total) by mouth 2 (two) times  daily as needed. 08/19/23   Johnson, Megan P, DO  metoprolol tartrate (LOPRESSOR) 100 MG tablet Take 0.5 tablets (50 mg total) by mouth 2 (two) times daily. 08/19/23   Johnson, Megan P, DO  rivaroxaban (XARELTO) 20 MG TABS tablet Take 1 tablet (20 mg total) by mouth daily. 10/14/23   Antonieta Iba, MD      VITAL SIGNS:  Blood pressure (!) 85/55, pulse 95, temperature 98.8 F (37.1 C), resp. rate 18, height 5\' 11"  (1.803 m), weight 122.5 kg, SpO2 100%.  PHYSICAL EXAMINATION:  Physical Exam  GENERAL:  72 y.o.-year-old Caucasian male patient lying in the bed with no acute distress.  EYES: Pupils equal, round, reactive  to light and accommodation. No scleral icterus. Extraocular muscles intact.  HEENT: Head atraumatic, normocephalic. Oropharynx and nasopharynx clear.  NECK:  Supple, no jugular venous distention. No thyroid enlargement, no tenderness.  LUNGS: Normal breath sounds bilaterally, no wheezing, rales,rhonchi or crepitation. No use of accessory muscles of respiration.  CARDIOVASCULAR: Regular rate and rhythm, S1, S2 normal. No murmurs, rubs, or gallops.  ABDOMEN: Soft, nondistended, nontender. Bowel sounds present. No organomegaly or mass.  EXTREMITIES: No pedal edema, cyanosis, or clubbing.  Skin as below. NEUROLOGIC: Cranial nerves II through XII are intact. Muscle strength 5/5 in all extremities. Sensation intact. Gait not checked.  PSYCHIATRIC: The patient is alert and oriented x 3.  Normal affect and good eye contact. SKIN: Right upper extremity erythema with induration, warmth, and tenderness.  Left flank posterior metallic hyperpigmentation.      LABORATORY PANEL:   CBC Recent Labs  Lab 11/06/23 2232  WBC 12.1*  HGB 15.9  HCT 48.5  PLT 153   ------------------------------------------------------------------------------------------------------------------  Chemistries  Recent Labs  Lab 11/06/23 2232  NA 129*  K 4.2  CL 90*  CO2 23  GLUCOSE 298*  BUN 33*   CREATININE 1.78*  CALCIUM 8.3*  AST 22  ALT 17  ALKPHOS 76  BILITOT 1.5*   ------------------------------------------------------------------------------------------------------------------  Cardiac Enzymes No results for input(s): "TROPONINI" in the last 168 hours. ------------------------------------------------------------------------------------------------------------------  RADIOLOGY:  US Venous Img Lower Unilateral Right Result Date: 11/07/2023 CLINICAL DATA:  Right leg pain and swelling, initial encounter EXAM: RIGHT LOWER EXTREMITY VENOUS DOPPLER ULTRASOUND TECHNIQUE: Gray-scale sonography with graded compression, as well as color Doppler and duplex ultrasound were performed to evaluate the lower extremity deep venous systems from the level of the common femoral vein and including the common femoral, femoral, profunda femoral, popliteal and calf veins including the posterior tibial, peroneal and gastrocnemius veins when visible. The superficial great saphenous vein was also interrogated. Spectral Doppler was utilized to evaluate flow at rest and with distal augmentation maneuvers in the common femoral, femoral and popliteal veins. COMPARISON:  None Available. FINDINGS: Contralateral Common Femoral Vein: Respiratory phasicity is normal and symmetric with the symptomatic side. No evidence of thrombus. Normal compressibility. Common Femoral Vein: No evidence of thrombus. Normal compressibility, respiratory phasicity and response to augmentation. Saphenofemoral Junction: No evidence of thrombus. Normal compressibility and flow on color Doppler imaging. Profunda Femoral Vein: No evidence of thrombus. Normal compressibility and flow on color Doppler imaging. Femoral Vein: No evidence of thrombus. Normal compressibility, respiratory phasicity and response to augmentation. Popliteal Vein: No evidence of thrombus. Normal compressibility, respiratory phasicity and response to augmentation. Calf  Veins: No evidence of thrombus. Normal compressibility and flow on color Doppler imaging. Superficial Great Saphenous Vein: No evidence of thrombus. Normal compressibility. Venous Reflux:  None. Other Findings: Reactive lymph nodes are noted in the right inguinal region. IMPRESSION: No evidence of deep venous thrombosis. Electronically Signed   By: Alcide Clever M.D.   On: 11/07/2023 01:28   DG Chest 2 View Result Date: 11/06/2023 CLINICAL DATA:  abdominal pain EXAM: CHEST - 2 VIEW COMPARISON:  None Available. FINDINGS: The heart size and mediastinal contours are within normal limits. Both lungs are clear. The visualized skeletal structures are unremarkable. IMPRESSION: No active cardiopulmonary disease. Electronically Signed   By: Deatra Robinson M.D.   On: 11/06/2023 23:41      IMPRESSION AND PLAN:  Assessment and Plan: * Sepsis due to cellulitis (HCC) - Sepsis is manifested by leukocytosis, tachycardia and tachypnea. - The patient will  be admitted to a progressive unit bed. - We will continue hydration with IV lactated ringer. - We will continue antibiotic therapy with IV Rocephin and vancomycin. - Warm compresses will be applied. - Pain management will be provided. - The patient may meet severe sepsis criteria given elevated lactic acid and AKI.  Chronic atrial fibrillation with RVR (HCC) - We will continue Xarelto and aspirin. - We will continue Lopressor. - His rate is being controlled with hydration.  Uncontrolled type 2 diabetes mellitus with hyperglycemia (HCC) - The patient will be placed on supplement coverage with NovoLog. - We will continue Jardiance and hold off metformin. - We will continue Neurontin for peripheral neuropathy.  Acute kidney injury superimposed on chronic kidney disease (HCC) - This is AKI superimposed on stage IIIa chronic kidney disease. - We will continue hydration with IV ringer and follow BMP. - We will avoid nephrotoxins.  Hyponatremia - This is  likely hypovolemic. - We will continue hydration with IV lactated ringer and follow BMP.  Dyslipidemia - Will statin therapy.    DVT prophylaxis: Xarelto Advanced Care Planning:  Code Status: full code. Family Communication:  The plan of care was discussed in details with the patient (and family). I answered all questions. The patient agreed to proceed with the above mentioned plan. Further management will depend upon hospital course. Disposition Plan: Back to previous home environment Consults called: none. All the records are reviewed and case discussed with ED provider.  Status is: Inpatient  At the time of the admission, it appears that the appropriate admission status for this patient is inpatient.  This is judged to be reasonable and necessary in order to provide the required intensity of service to ensure the patient's safety given the presenting symptoms, physical exam findings and initial radiographic and laboratory data in the context of comorbid conditions.  The patient requires inpatient status due to high intensity of service, high risk of further deterioration and high frequency of surveillance required.  I certify that at the time of admission, it is my clinical judgment that the patient will require inpatient hospital care extending more than 2 midnights.                            Dispo: The patient is from: Home              Anticipated d/c is to: Home              Patient currently is not medically stable to d/c.              Difficult to place patient: No  Hannah Beat M.D on 11/07/2023 at 5:09 AM  [facility]   From 7 PM-7 AM, contact night-coverage [email or web address]  CC: Primary care physician; Dorcas Carrow, DO

## 2023-11-07 NOTE — Assessment & Plan Note (Signed)
-   This is AKI superimposed on stage IIIa chronic kidney disease. - We will continue hydration with IV ringer and follow BMP. - We will avoid nephrotoxins.

## 2023-11-07 NOTE — Progress Notes (Addendum)
Pharmacy Antibiotic Note  Jeff [name]. is a 72 y.o. male admitted on 11/06/2023 with cellulitis. They presented with right lower extremity pain and associated swelling with erythremia. Presented with fever 100.8 and chills. No reported trauma or insect bites. Pharmacy has been consulted for Vancomycin dosing.  Plan, 11/07/2023 D2 antibiotics, D1 Vancomycin  Afebrile 24/hour Scr 1.55 (baseline 1.4 09/2023) WBC 11.5 (elevated) 12/30 DVT ruled out Lactic acid 1.4 >> 2.3  Plan: Vancomycin 1500 mg IV Q 24 hrs. Goal AUC 400-550.  Expected AUC: 400.3 Css min: 10.2 SCr used: 1.55 Renal function slightly improved towards baseline; monitor for adjustments if returns to baseline ~1.4 Also on Ceftriaxone 2g daily  Monitor to de-escalate vancomycin (no signs of abscess, resolving sepsis s/sx)  Temp (24hrs), Avg:98.3 F (36.8 C), Min:97.8 F (36.6 C), Max:98.8 F (37.1 C)  Recent Labs  Lab 11/06/23 2232 11/06/23 2343 11/07/23 0100 11/07/23 0500  WBC 12.1*  --   --  11.5*  CREATININE 1.78*  --   --  1.55*  LATICACIDVEN  --  2.3* 1.4  --     Estimated Creatinine Clearance: 57.4 mL/min (A) (by C-G formula based on SCr of 1.55 mg/dL (H)).    Allergies  Allergen Reactions   Penicillins    Antimicrobials this admission: 12/29 Cefepime >> x 1 dose 12/30 Flagyl >> x 1 dose 12/30 Ceftriaxone 2g >> 12/30 Vancomycin >>   Microbiology results: 12/29 BCx: NGTD  Thank you for allowing pharmacy to be a part of this patient's care.  Jeff [name], PharmD Pharmacy Resident  11/07/2023 11:49 AM

## 2023-11-07 NOTE — Assessment & Plan Note (Signed)
-   This is likely hypovolemic. - We will continue hydration with IV lactated ringer and follow BMP.

## 2023-11-07 NOTE — Assessment & Plan Note (Addendum)
-   Sepsis is manifested by leukocytosis, tachycardia and tachypnea. - The patient will be admitted to a progressive unit bed. - We will continue hydration with IV lactated ringer. - We will continue antibiotic therapy with IV Rocephin and vancomycin. - Warm compresses will be applied. - Pain management will be provided. - The patient may meet severe sepsis criteria given elevated lactic acid and AKI.

## 2023-11-08 DIAGNOSIS — E785 Hyperlipidemia, unspecified: Secondary | ICD-10-CM

## 2023-11-08 DIAGNOSIS — I482 Chronic atrial fibrillation, unspecified: Secondary | ICD-10-CM | POA: Diagnosis not present

## 2023-11-08 DIAGNOSIS — L0390 Cellulitis, unspecified: Secondary | ICD-10-CM | POA: Diagnosis not present

## 2023-11-08 DIAGNOSIS — E1165 Type 2 diabetes mellitus with hyperglycemia: Secondary | ICD-10-CM | POA: Diagnosis not present

## 2023-11-08 DIAGNOSIS — L03115 Cellulitis of right lower limb: Principal | ICD-10-CM

## 2023-11-08 LAB — CBG MONITORING, ED
Glucose-Capillary: 128 mg/dL — ABNORMAL HIGH (ref 70–99)
Glucose-Capillary: 112 mg/dL — ABNORMAL HIGH (ref 70–99)

## 2023-11-08 LAB — MRSA NEXT GEN BY PCR, NASAL: MRSA by PCR Next Gen: NOT DETECTED

## 2023-11-08 LAB — CREATININE, SERUM
Creatinine, Ser: 1.55 mg/dL — ABNORMAL HIGH (ref 0.61–1.24)
GFR, Estimated: 47 mL/min — ABNORMAL LOW (ref >60)

## 2023-11-08 MED ORDER — DOXAZOSIN MESYLATE 2 MG PO TABS: 2.0000 mg | ORAL_TABLET | Freq: Two times a day (BID) | ORAL | Status: DC

## 2023-11-08 MED ORDER — METOPROLOL TARTRATE 75 MG PO TABS: 75.0000 mg | ORAL_TABLET | Freq: Two times a day (BID) | ORAL | 1 refills | Status: DC

## 2023-11-08 MED ORDER — METOPROLOL TARTRATE 50 MG PO TABS
75.0000 mg | ORAL_TABLET | Freq: Two times a day (BID) | ORAL | Status: DC
  Administered 2023-11-08: 75 mg via ORAL
  Filled 2023-11-08: qty 1

## 2023-11-08 MED ORDER — CEPHALEXIN 500 MG PO CAPS: 500.0000 mg | ORAL_CAPSULE | Freq: Four times a day (QID) | ORAL | 0 refills | Status: AC

## 2023-11-08 MED ORDER — BENAZEPRIL HCL 20 MG PO TABS: 20.0000 mg | ORAL_TABLET | Freq: Every day | ORAL | Status: DC

## 2023-11-08 MED ORDER — ONDANSETRON HCL 4 MG PO TABS: 4.0000 mg | ORAL_TABLET | Freq: Four times a day (QID) | ORAL | 0 refills | Status: AC | PRN

## 2023-11-08 MED ORDER — DOXYCYCLINE HYCLATE 100 MG PO TABS: 100.0000 mg | ORAL_TABLET | Freq: Two times a day (BID) | ORAL | 0 refills | Status: AC

## 2023-11-08 NOTE — Evaluation (Signed)
 Physical Therapy Evaluation Patient Details Name: Jeff [name]. MRN: [id number] DOB: [birth year] Today's Date: 11/08/2023  History of Present Illness  Pt is a 72 y/o M admitted on 11/06/23 after presenting with c/o acute RLE pain & swelling, endorsed a fever at home. Pt negative for DVT. Pt being treated for sepsis 2/2 RLE cellulitis. PMH: HF, HTN, DM, dyslipidemia, chronic a-fib, chronic DVT  Clinical Impression  Pt seen for PT evaluation with pt agreeable. 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 On this date, pt presents with erythema to distal RLE but notes pain has decreased to 4/10. Pt is able to complete bed mobility without assistance, ambulate in hallway without AD without LOB. At this time, pt appears to be at baseline level of function & is eager for d/c home soon. Pt does not require acute PT services, PT to complete current orders, please re-consult if new needs arise.        If plan is discharge home, recommend the following:     Can travel by private vehicle        Equipment Recommendations None recommended by PT  Recommendations for Other Services       Functional Status Assessment Patient has not had a recent decline in their functional status     Precautions / Restrictions Precautions Precautions: None Restrictions Weight Bearing Restrictions Per Provider Order: No      Mobility  Bed Mobility Overal bed mobility: Modified Independent                  Transfers Overall transfer level: Independent Equipment used: None                    Ambulation/Gait Ambulation/Gait assistance: Independent Gait Distance (Feet):  (>200 ft) Assistive device: None Gait Pattern/deviations: Step-through pattern       General Gait Details: no LOB  Stairs            Wheelchair Mobility     Tilt Bed    Modified Rankin (Stroke Patients Only)       Balance Overall balance assessment:  Independent                                           Pertinent Vitals/Pain Pain Assessment Pain Assessment: 0-10 Pain Score: 4  Pain Location: RLE Pain Descriptors / Indicators: Discomfort Pain Intervention(s): Monitored during session    Home Living Family/patient expects to be discharged to:: Private residence Living Arrangements: Spouse/significant other Available Help at Discharge: Family Type of Home: House Home Access: Level entry     Alternate Level Stairs-Number of Steps: flight Home Layout: Multi-level        Prior Function Prior Level of Function : Independent/Modified Independent;Driving                     Extremity/Trunk Assessment   Upper Extremity Assessment Upper Extremity Assessment: Overall WFL for tasks assessed    Lower Extremity Assessment Lower Extremity Assessment: Overall WFL for tasks assessed (distal RLE erythema)    Cervical / Trunk Assessment Cervical / Trunk Assessment: Normal  Communication   Communication Communication: No apparent difficulties  Cognition Arousal: Alert Behavior During Therapy: WFL for tasks assessed/performed Overall Cognitive Status: Within Functional Limits for tasks assessed  General Comments      Exercises     Assessment/Plan    PT Assessment Patient does not need any further PT services  PT Problem List         PT Treatment Interventions      PT Goals (Current goals can be found in the Care Plan section)  Acute Rehab PT Goals Patient Stated Goal: get better, go home PT Goal Formulation: With patient Time For Goal Achievement: 11/22/23 Potential to Achieve Goals: Good    Frequency       Co-evaluation               AM-PAC PT 6 Clicks Mobility  Outcome Measure Help needed turning from your back to your side while in a flat bed without using bedrails?: None Help needed moving from lying on your  back to sitting on the side of a flat bed without using bedrails?: None Help needed moving to and from a bed to a chair (including a wheelchair)?: None Help needed standing up from a chair using your arms (e.g., wheelchair or bedside chair)?: None Help needed to walk in hospital room?: None Help needed climbing 3-5 steps with a railing? : None 6 Click Score: 24    End of Session   Activity Tolerance: Patient tolerated treatment well Patient left: in bed;with call bell/phone within reach;with nursing/sitter in room        Time: 0905-0923 PT Time Calculation (min) (ACUTE ONLY): 18 min   Charges:   PT Evaluation $PT Eval Low Complexity: 1 Low   PT General Charges $$ ACUTE PT VISIT: 1 Visit         Richerd Pinal, PT, DPT 11/08/23, 9:30 AM   Richerd CHRISTELLA Pinal 11/08/2023, 9:28 AM

## 2023-11-08 NOTE — Progress Notes (Addendum)
 Pharmacy Antibiotic Note  Jeff [name]. is a 72 y.o. male admitted on 11/06/2023 with cellulitis. They presented with right lower extremity pain and associated swelling with erythremia. Presented with fever 100.8 and chills. No reported trauma or insect bites.Pharmacy has been consulted for Vancomycin  dosing.  Plan, 11/08/2023 D3 antibiotics, D2 Vancomycin   Afebrile 24/hour Scr 1.55 (baseline 1.4 09/2023) - ordered updated SCr WBC 11.5 (elevated, but down trending) 12/30 DVT ruled out Lactic acid 1.4 >> 2.3  Plan: Continue Vancomycin  1500 mg IV Q 24 hrs. Goal AUC 400-550.  Expected AUC: 400.3 Css min: 10.2 SCr used: 1.55 Renal function slightly improved towards baseline; monitor for adjustments if returns to baseline ~1.4 Also on Ceftriaxone  2g daily  Discussed with MD to de-escalate vancomycin , will follow for further recommendations based on patient cellulitis  Temp (24hrs), Avg:98.2 F (36.8 C), Min:97.5 F (36.4 C), Max:98.6 F (37 C)  Recent Labs  Lab 11/06/23 2232 11/06/23 2343 11/07/23 0100 11/07/23 0500  WBC 12.1*  --   --  11.5*  CREATININE 1.78*  --   --  1.55*  LATICACIDVEN  --  2.3* 1.4  --     Estimated Creatinine Clearance: 57.4 mL/min (A) (by C-G formula based on SCr of 1.55 mg/dL (H)).    Allergies  Allergen Reactions   Penicillins    Antimicrobials this admission: 12/29 Cefepime  >> x 1 dose 12/30 Flagyl  >> x 1 dose 12/30 Ceftriaxone  2g >> 12/30 Vancomycin  >>   Microbiology results: 12/29 BCx: NGTD 12/31 MRSA nares: Negative  Thank you for allowing pharmacy to be a part of this patient's care.  Alfonso MARLA Buys, PharmD Pharmacy Resident  11/08/2023 12:31 PM

## 2023-11-08 NOTE — ED Notes (Signed)
Report received from [facility], RN and assumed care of patient after moved to room 6. Patient connected to monitor, urinal within reach, call light within reach. No needs identified at this time.

## 2023-11-08 NOTE — ED Notes (Signed)
Meds given. MRSA swab obtained. Urinal emptied. Patient provided with ice water at request. VSS, CCM in use, call light within reach.

## 2023-11-08 NOTE — ED Notes (Signed)
 Pt discharged by other RN

## 2023-11-08 NOTE — Discharge Summary (Signed)
 Physician Discharge Summary   Patient: Jeff Hy. MRN: [id number] DOB: [birth year]  Admit date:     11/06/2023  Discharge date: 11/08/23  Discharge Physician: Amaryllis Dare   PCP: Vicci Duwaine SQUIBB, DO   Recommendations at discharge:  Please obtain CBC and BMP on follow-up Please ensure the completion of antibiotics Patient was told to hold antihypertensives for next couple of days and keep checking his blood pressure, he can restart if it started trending up. Follow-up with primary care provider within a week  Discharge Diagnoses: Principal Problem:   Sepsis due to cellulitis [facility]) Active Problems:   Chronic atrial fibrillation with RVR (HCC)   Uncontrolled type 2 diabetes mellitus with hyperglycemia (HCC)   Acute kidney injury superimposed on chronic kidney disease (HCC)   Hyponatremia   Dyslipidemia   Cellulitis of right lower extremity  Resolved Problems:   * No resolved hospital problems. *  Hospital Course:  Jeff Tietze. is 72 y.o. male with heart failure, diabetes, hypertension, dyslipidemia, chronic A-fib, chronic DVT, who presented to the ER with acute onset right lower extremity pain and swelling.  Patient also endorsed fever at home.  He denies any recent trauma to the area.  He does endorse that his heels have recently been cracking. Within the ED initial blood pressure was 81/59 with tachycardia.  It improved with hydration.  CBC revealed leukocytosis of 12.1.  Lactic acidosis resolved on repeat.  EKG in ED revealed A-fib with RVR, lower extremity Doppler negative for DVT.   Patient initially received broad-spectrum antibiotics and 3 L of IV fluid and later continued on vancomycin  and ceftriaxone  for right lower extremity cellulitis.  Continue to improve.  Pain has been improved.  Improving creatinine and leukocytosis.  Patient would like to go home, he was given Keflex  and doxycycline  for 1 week.  He was also advised to hold his home antihypertensives  for next couple of days as blood pressure has been within goal at this time and it was soft on admission.  He can resume his medications once blood pressure started trending up.  We increased the home dose of metoprolol  for better control of heart rate.  He will continue with rest of his home medications and need to have a close follow-up with his primary care provider for further management.    Consultants: None Procedures performed: None Disposition: Home Diet recommendation:  Discharge Diet Orders (From admission, onward)     Start     Ordered   11/08/23 0000  Diet - low sodium heart healthy        11/08/23 1450           Cardiac and Carb modified diet DISCHARGE MEDICATION: Allergies as of 11/08/2023       Reactions   Penicillins         Medication List     TAKE these medications    albuterol  108 (90 Base) MCG/ACT inhaler Commonly known as: VENTOLIN  HFA Inhale 2 puffs into the lungs every 4 (four) hours as needed for wheezing or shortness of breath.   aspirin  EC 325 MG tablet Take 325 mg by mouth daily.   atorvastatin  20 MG tablet Commonly known as: LIPITOR Take 0.5 tablets (10 mg total) by mouth daily.   benazepril  20 MG tablet Commonly known as: LOTENSIN  Take 1 tablet (20 mg total) by mouth daily. Hold for few days What changed: additional instructions   cephALEXin  500 MG capsule Commonly known as: KEFLEX  Take 1 capsule (  500 mg total) by mouth 4 (four) times daily for 7 days.   doxazosin  2 MG tablet Commonly known as: CARDURA  Take 1 tablet (2 mg total) by mouth 2 (two) times daily. Hold for few days What changed: additional instructions   doxycycline  100 MG tablet Commonly known as: VIBRA -TABS Take 1 tablet (100 mg total) by mouth 2 (two) times daily for 7 days.   empagliflozin  25 MG Tabs tablet Commonly known as: Jardiance  Take 1 tablet (25 mg total) by mouth daily before breakfast.   fluticasone  50 MCG/ACT nasal spray Commonly known as:  FLONASE  Place 2 sprays into both nostrils daily as needed for allergies or rhinitis.   furosemide  20 MG tablet Commonly known as: LASIX  Take 1 tablet (20 mg total) by mouth daily.   gabapentin  100 MG capsule Commonly known as: NEURONTIN  Take 1 capsule (100 mg total) by mouth 3 (three) times daily.   metFORMIN  500 MG 24 hr tablet Commonly known as: GLUCOPHAGE -XR Take 2 tablets (1,000 mg total) by mouth 2 (two) times daily as needed. What changed: when to take this   Metoprolol  Tartrate 75 MG Tabs Take 1 tablet (75 mg total) by mouth 2 (two) times daily. What changed:  medication strength how much to take   ondansetron  4 MG tablet Commonly known as: ZOFRAN  Take 1 tablet (4 mg total) by mouth every 6 (six) hours as needed for nausea.   rivaroxaban  20 MG Tabs tablet Commonly known as: Xarelto  Take 1 tablet (20 mg total) by mouth daily.        Follow-up Information     Vicci Bouchard P, DO. Schedule an appointment as soon as possible for a visit in 1 week(s).   Specialty: Family Medicine Contact information: [address] [place] [address] [phone]                Discharge Exam: Jeff [name]   11/06/23 2228  Weight: 122.5 kg   General.  Obese gentleman, in no acute distress. Pulmonary.  Lungs clear bilaterally, normal respiratory effort. CV.  Regular rate and rhythm, no JVD, rub or murmur. Abdomen.  Soft, nontender, nondistended, BS positive. CNS.  Alert and oriented .  No focal neurologic deficit. Extremities.  Right lower extremity with edema and erythema up to below knee, not involving foot, pulses intact, no edema on left. Psychiatry.  Judgment and insight appears normal.   Condition at discharge: stable  The results of significant diagnostics from this hospitalization (including imaging, microbiology, ancillary and laboratory) are listed below for reference.   Imaging Studies: US  Venous Img Lower Unilateral Right Result Date:  11/07/2023 CLINICAL DATA:  Right leg pain and swelling, initial encounter EXAM: RIGHT LOWER EXTREMITY VENOUS DOPPLER ULTRASOUND TECHNIQUE: Gray-scale sonography with graded compression, as well as color Doppler and duplex ultrasound were performed to evaluate the lower extremity deep venous systems from the level of the common femoral vein and including the common femoral, femoral, profunda femoral, popliteal and calf veins including the posterior tibial, peroneal and gastrocnemius veins when visible. The superficial great saphenous vein was also interrogated. Spectral Doppler was utilized to evaluate flow at rest and with distal augmentation maneuvers in the common femoral, femoral and popliteal veins. COMPARISON:  None Available. FINDINGS: Contralateral Common Femoral Vein: Respiratory phasicity is normal and symmetric with the symptomatic side. No evidence of thrombus. Normal compressibility. Common Femoral Vein: No evidence of thrombus. Normal compressibility, respiratory phasicity and response to augmentation. Saphenofemoral Junction: No evidence of thrombus. Normal compressibility and flow  on color Doppler imaging. Profunda Femoral Vein: No evidence of thrombus. Normal compressibility and flow on color Doppler imaging. Femoral Vein: No evidence of thrombus. Normal compressibility, respiratory phasicity and response to augmentation. Popliteal Vein: No evidence of thrombus. Normal compressibility, respiratory phasicity and response to augmentation. Calf Veins: No evidence of thrombus. Normal compressibility and flow on color Doppler imaging. Superficial Great Saphenous Vein: No evidence of thrombus. Normal compressibility. Venous Reflux:  None. Other Findings: Reactive lymph nodes are noted in the right inguinal region. IMPRESSION: No evidence of deep venous thrombosis. Electronically Signed   By: Oneil Devonshire M.D.   On: 11/07/2023 01:28   DG Chest 2 View Result Date: 11/06/2023 CLINICAL DATA:  abdominal  pain EXAM: CHEST - 2 VIEW COMPARISON:  None Available. FINDINGS: The heart size and mediastinal contours are within normal limits. Both lungs are clear. The visualized skeletal structures are unremarkable. IMPRESSION: No active cardiopulmonary disease. Electronically Signed   By: Franky Stanford M.D.   On: 11/06/2023 23:41   MR Brain Wo Contrast Result Date: 10/23/2023 CLINICAL DATA:  Neuro deficit, acute, stroke suspected. Right-sided facial droop for a couple of weeks. EXAM: MRI HEAD WITHOUT CONTRAST TECHNIQUE: Multiplanar, multiecho pulse sequences of the brain and surrounding structures were obtained without intravenous contrast. COMPARISON:  Head CT 10/04/2023 FINDINGS: Brain: Diffusion imaging does not show any acute or subacute infarction or other cause of restricted diffusion. There is an old small vessel infarction in the left para median pons. No focal cerebellar insult. Cerebral hemispheres show old small vessel infarctions of the thalami and within the hemispheric deep and subcortical white matter. No cortical or large vessel territory infarction. No mass, hemorrhage, hydrocephalus or extra-axial collection. Few punctate foci of hemosiderin deposition associated with some of the old small vessel insults. Vascular: Major vessels at the base of the brain show flow. Skull and upper cervical spine: Negative Sinuses/Orbits: Clear/normal Other: None IMPRESSION: No acute finding by MRI. Old small vessel infarctions of the left para median pons, thalami and hemispheric deep and subcortical white matter. Electronically Signed   By: Oneil Officer M.D.   On: 10/23/2023 11:11    Microbiology: Results for orders placed or performed during the hospital encounter of 11/06/23  Resp panel by RT-PCR (RSV, Flu A&B, Covid) Anterior Nasal Swab     Status: None   Collection Time: 11/06/23 10:32 PM   Specimen: Anterior Nasal Swab  Result Value Ref Range Status   SARS Coronavirus 2 by RT PCR NEGATIVE NEGATIVE Final     Comment: (NOTE) SARS-CoV-2 target nucleic acids are NOT DETECTED.  The SARS-CoV-2 RNA is generally detectable in upper respiratory specimens during the acute phase of infection. The lowest concentration of SARS-CoV-2 viral copies this assay can detect is 138 copies/mL. A negative result does not preclude SARS-Cov-2 infection and should not be used as the sole basis for treatment or other patient management decisions. A negative result may occur with  improper specimen collection/handling, submission of specimen other than nasopharyngeal swab, presence of viral mutation(s) within the areas targeted by this assay, and inadequate number of viral copies(<138 copies/mL). A negative result must be combined with clinical observations, patient history, and epidemiological information. The expected result is Negative.  Fact Sheet for Patients:  bloggercourse.com  Fact Sheet for Healthcare Providers:  seriousbroker.it  This test is no t yet approved or cleared by the [place]  FDA and  has been authorized for detection and/or diagnosis of SARS-CoV-2 by FDA under an Emergency Use Authorization (EUA).  This EUA will remain  in effect (meaning this test can be used) for the duration of the COVID-19 declaration under Section 564(b)(1) of the Act, 21 U.S.C.section 360bbb-3(b)(1), unless the authorization is terminated  or revoked sooner.       Influenza A by PCR NEGATIVE NEGATIVE Final   Influenza B by PCR NEGATIVE NEGATIVE Final    Comment: (NOTE) The Xpert Xpress SARS-CoV-2/FLU/RSV plus assay is intended as an aid in the diagnosis of influenza from Nasopharyngeal swab specimens and should not be used as a sole basis for treatment. Nasal washings and aspirates are unacceptable for Xpert Xpress SARS-CoV-2/FLU/RSV testing.  Fact Sheet for Patients: bloggercourse.com  Fact Sheet for Healthcare  Providers: seriousbroker.it  This test is not yet approved or cleared by the [place]  FDA and has been authorized for detection and/or diagnosis of SARS-CoV-2 by FDA under an Emergency Use Authorization (EUA). This EUA will remain in effect (meaning this test can be used) for the duration of the COVID-19 declaration under Section 564(b)(1) of the Act, 21 U.S.C. section 360bbb-3(b)(1), unless the authorization is terminated or revoked.     Resp Syncytial Virus by PCR NEGATIVE NEGATIVE Final    Comment: (NOTE) Fact Sheet for Patients: bloggercourse.com  Fact Sheet for Healthcare Providers: seriousbroker.it  This test is not yet approved or cleared by the [place]  FDA and has been authorized for detection and/or diagnosis of SARS-CoV-2 by FDA under an Emergency Use Authorization (EUA). This EUA will remain in effect (meaning this test can be used) for the duration of the COVID-19 declaration under Section 564(b)(1) of the Act, 21 U.S.C. section 360bbb-3(b)(1), unless the authorization is terminated or revoked.  Performed at [facility], [address] [place], [place] [address]   Blood Culture (routine x 2)     Status: None (Preliminary result)   Collection Time: 11/06/23 11:43 PM   Specimen: BLOOD  Result Value Ref Range Status   Specimen Description BLOOD RIGTH ARM  Final   Special Requests   Final    BOTTLES DRAWN AEROBIC AND ANAEROBIC Blood Culture results may not be optimal due to an inadequate volume of blood received in culture bottles   Culture   Final    NO GROWTH 1 DAY Performed at [facility], [address], [place], [place] [address]    Report Status PENDING  Incomplete  Blood Culture (routine x 2)     Status: None (Preliminary result)   Collection Time: 11/06/23 11:43 PM   Specimen: BLOOD  Result Value Ref Range Status   Specimen Description BLOOD LEFT  ARM  Final   Special Requests   Final    BOTTLES DRAWN AEROBIC AND ANAEROBIC Blood Culture adequate volume   Culture   Final    NO GROWTH 2 DAYS Performed at [facility], [address] [place], [place] [address]    Report Status PENDING  Incomplete  MRSA Next Gen by PCR, Nasal     Status: None   Collection Time: 11/08/23  5:06 AM   Specimen: Nasal Mucosa; Nasal Swab  Result Value Ref Range Status   MRSA by PCR Next Gen NOT DETECTED NOT DETECTED Final    Comment: (NOTE) The GeneXpert MRSA Assay (FDA approved for NASAL specimens only), is one component of a comprehensive MRSA colonization surveillance program. It is not intended to diagnose MRSA infection nor to guide or monitor treatment for MRSA infections. Test performance is not FDA approved in patients less than 51 years old.  Performed at [facility], [address] [place], [place] [address]     Labs: CBC: Recent Labs  Lab 11/06/23 2232 11/07/23 0500  WBC 12.1* 11.5*  HGB 15.9 14.1  HCT 48.5 43.5  MCV 86.9 87.7  PLT 153 125*   Basic Metabolic Panel: Recent Labs  Lab 11/06/23 2232 11/07/23 0500 11/08/23 1259  NA 129* 131*  --   K 4.2 4.3  --   CL 90* 97*  --   CO2 23 19*  --   GLUCOSE 298* 284*  --   BUN 33* 33*  --   CREATININE 1.78* 1.55* 1.55*  CALCIUM  8.3* 7.5*  --    Liver Function Tests: Recent Labs  Lab 11/06/23 2232  AST 22  ALT 17  ALKPHOS 76  BILITOT 1.5*  PROT 6.7  ALBUMIN 3.5   CBG: Recent Labs  Lab 11/07/23 1138 11/07/23 1611 11/07/23 2236 11/08/23 0713 11/08/23 1302  GLUCAP 108* 124* 97 128* 112*    Discharge time spent: greater than 30 minutes.  This record has been created using Conservation officer, historic buildings. Errors have been sought and corrected,but may not always be located. Such creation errors do not reflect on the standard of care.   Signed: Amaryllis Dare, MD [facility] 11/08/2023

## 2023-11-10 ENCOUNTER — Telehealth: Payer: Self-pay

## 2023-11-10 NOTE — Patient Instructions (Signed)
 Visit Information  Thank you for taking time to visit with me today. Please don't hesitate to contact me if I can be of assistance to you    Reviewed goals for care Patient/ Caregiver  verbalizes understanding of instructions and care plan provided. Patient / Caregiver was encouraged to make informed decisions about care, actively participate in managing health conditions, and implement lifestyle changes as needed to promote independence and self-management of their medical care    If you need to speak to a Nurse you may  call me directly at the number below or if I am unavailable,and  your need is urgent  please call the main [facility] number at [phone] and ask to speak with one of the [facility] ( Transition of Care )  Nurses  .     Additionally, If you experience worsening of your symptoms, develop shortness of breath, If you are experiencing a medical emergency,  develop suicidal or homicidal thoughts you must seek medical attention immediately by calling 911 or report to your local emergency department or urgent care.   If you have a non-emergency medical problem during routine business hours, please contact your provider's office and ask to speak with a nurse.       Please take the time to read instructions/literature along with the possible adverse reactions/side effects for all the Medicines that have been prescribed to you. Only take newly prescribed  Medications after you have completely understood and accept all the possible adverse reactions/side effects.   Do not take more than prescribed Medications for  Pain, Sleep and Anxiety. Do not drive when taking Pain medications or sleep aid/ insomnia  medications It is not advisable to combine anxiety, sleep and pain medications without talking with your primary care practitioner    If you are experiencing a Mental Health or Behavioral Health Crisis or need someone to talk to Please call the Suicide and Crisis Lifeline: 988 You may also call  the [place] National Suicide Prevention Lifeline: (787) 009-3087 or TTY: 307 297 2040 TTY 831-223-9914) to talk to a trained counselor.  You may call the Behavioral Health Crisis Line at 223-654-4072, at any time, 24 hours a day, 7 days a week- however If you are in danger or need immediate medical attention, call 911.   If you would like help to quit smoking, call 1-800-QUIT-NOW ( 763 779 2394) OR Espaol: 1-855-Djelo-Ya (8-841-660-6301) o para ms informacin haga clic aqu or Text READY to 601-093 to register via text.   Susa Loffler , BSN, RN Care Management Coordinator [place]   [facility] [email or web address] Direct Dial: [phone]

## 2023-11-10 NOTE — Transitions of Care (Post Inpatient/ED Visit) (Signed)
 11/10/2023  Name: Patsy Dawn [facility]. MRN: [id number] DOB: 1951/07/31  Today's TOC FU Call Status: Today's TOC FU Call Status:: Successful TOC FU Call Completed TOC FU Call Complete Date: 11/10/23 Patient's Name and Date of Birth confirmed.  Transition Care Management Follow-up Telephone Call Date of Discharge: 11/08/23 Discharge Facility: [facility]) Type of Discharge: Inpatient Admission Primary Inpatient Discharge Diagnosis:: Cellulitis RLE How have you been since you were released from the hospital?: Better Any questions or concerns?: No  Items Reviewed: Did you receive and understand the discharge instructions provided?: Yes Medications obtained,verified, and reconciled?: Yes (Medications Reviewed) Any new allergies since your discharge?: No Dietary orders reviewed?: Yes Type of Diet Ordered:: Reg Heart Healthy Do you have support at home?: Yes People in Home: significant other Name of Support/Comfort Primary Source: Dorthea ETTER Ask noted in system)  Medications Reviewed Today: Medications Reviewed Today     Reviewed by Willma Camelia CROME, RN (Registered Nurse) on 11/10/23 at [phone]  Med List Status: <None>   Medication Order Taking? Sig Documenting Provider Last Dose Status Informant  albuterol  (VENTOLIN  HFA) 108 (90 Base) MCG/ACT inhaler [id number] Yes Inhale 2 puffs into the lungs every 4 (four) hours as needed for wheezing or shortness of breath. Vicci Duwaine SQUIBB, DO Taking Active Self  aspirin  EC 325 MG tablet [id number] Yes Take 325 mg by mouth daily. [provider] Taking Active Self  atorvastatin  (LIPITOR) 20 MG tablet [id number] Yes Take 0.5 tablets (10 mg total) by mouth daily. Vicci Duwaine P, DO Taking Active Self  benazepril  (LOTENSIN ) 20 MG tablet [id number] No Take 1 tablet (20 mg total) by mouth daily. Hold for few days  Patient not taking: Reported on 11/10/2023   Caleen Qualia, MD Not Taking Active   cephALEXin  (KEFLEX ) 500 MG capsule [id number] Yes  Take 1 capsule (500 mg total) by mouth 4 (four) times daily for 7 days. Caleen Qualia, MD Taking Active   doxazosin  (CARDURA ) 2 MG tablet [id number] Yes Take 1 tablet (2 mg total) by mouth 2 (two) times daily. Hold for few days Amin, Sumayya, MD Taking Active   doxycycline  (VIBRA -TABS) 100 MG tablet [id number] Yes Take 1 tablet (100 mg total) by mouth 2 (two) times daily for 7 days. Caleen Qualia, MD Taking Active   empagliflozin  (JARDIANCE ) 25 MG TABS tablet [id number] No Take 1 tablet (25 mg total) by mouth daily before breakfast.  Patient not taking: Reported on 11/07/2023   Vicci Duwaine SQUIBB, DO Not Taking Active Self  fluticasone  (FLONASE ) 50 MCG/ACT nasal spray [id number] Yes Place 2 sprays into both nostrils daily as needed for allergies or rhinitis. Vicci Duwaine P, DO Taking Active Self  furosemide  (LASIX ) 20 MG tablet [id number] Yes Take 1 tablet (20 mg total) by mouth daily. Vicci Duwaine P, DO Taking Active Self  gabapentin  (NEURONTIN ) 100 MG capsule [id number] No Take 1 capsule (100 mg total) by mouth 3 (three) times daily.  Patient not taking: Reported on 11/07/2023   Vicci Duwaine SQUIBB, DO Not Taking Active Self  metFORMIN  (GLUCOPHAGE -XR) 500 MG 24 hr tablet [id number] Yes Take 2 tablets (1,000 mg total) by mouth 2 (two) times daily as needed.  Patient taking differently: Take 1,000 mg by mouth 2 (two) times daily.   Vicci Duwaine P, DO Taking Active Self  metoprolol  tartrate 75 MG TABS [id number] Yes Take 1 tablet (75 mg total) by mouth 2 (two) times daily. Caleen Qualia, MD Taking Active   ondansetron  (ZOFRAN ) 4 MG tablet [id number] Yes Take  1 tablet (4 mg total) by mouth every 6 (six) hours as needed for nausea. Caleen Qualia, MD Taking Active   rivaroxaban  (XARELTO ) 20 MG TABS tablet [id number] Yes Take 1 tablet (20 mg total) by mouth daily. Gollan, Timothy J, MD Taking Active Self            Home Care and Equipment/Supplies: Were Home Health Services Ordered?: No Any new equipment  or medical supplies ordered?: No  Functional Questionnaire: Do you need assistance with bathing/showering or dressing?: No Do you need assistance with meal preparation?: No Do you need assistance with eating?: No Do you have difficulty maintaining continence: No Do you need assistance with getting out of bed/getting out of a chair/moving?: No Do you have difficulty managing or taking your medications?: No (Medication reconciliation/ review completed; confirmed patient is taking all newly prescribed medications as instructed; based on most recent discharge summary medication list;  Aware of changes to previous medications and dosage adjustments.)  Follow up appointments reviewed: PCP Follow-up appointment confirmed?: Yes Date of PCP follow-up appointment?: 11/21/23 Follow-up Provider: Duwaine Louder Specialist [facility] Follow-up appointment confirmed?: Yes Date of Specialist follow-up appointment?: 12/02/23 Follow-Up Specialty Provider:: Cardiology Do you need transportation to your follow-up appointment?: No (He drives) Do you understand care options if your condition(s) worsen?: Yes-patient verbalized understanding  SDOH Interventions Today    Flowsheet Row Most Recent Value  SDOH Interventions   Food Insecurity Interventions Intervention Not Indicated  Housing Interventions Intervention Not Indicated  Transportation Interventions Intervention Not Indicated, Patient Resources (Friends/Family)  Utilities Interventions Intervention Not Indicated  Social Connections Interventions Intervention Not Indicated      Interventions Today    Flowsheet Row Most Recent Value  General Interventions   General Interventions Discussed/Reviewed General Interventions Discussed, Doctor Visits  Exercise Interventions   Exercise Discussed/Reviewed Exercise Reviewed, Physical Activity  Physical Activity Discussed/Reviewed Physical Activity Reviewed  Mental Health Interventions   Mental Health  Discussed/Reviewed Coping Strategies  Nutrition Interventions   Nutrition Discussed/Reviewed Nutrition Reviewed  Pharmacy Interventions   Pharmacy Dicussed/Reviewed Medications and their functions, Medication Adherence, Affording Medications         Discussed [facility]  TOC program and weekly calls to patient to assess condition/status, medication management  and provide support/education as indicated . Patient/ Caregiver voiced understanding and declined enrollment in the 30-day TOC Program.  He is doing well at home Has follow-up appointments LE elevated Less redness  Skin intact. He is taking ABT as ordered. He reports he felt his care was well handled. He was moved due to a water  break, but found his care expedited and felt he received prompt care.    The patient has been provided with contact information for the care management team and has been advised to call with any health related questions or concerns.    Bari Mayans , BSN, RN Care Management Coordinator [place]   [facility] [email or web address] Direct Dial: [phone]

## 2023-11-11 LAB — CULTURE, BLOOD (ROUTINE X 2)
Culture: NO GROWTH
Special Requests: ADEQUATE

## 2023-11-12 LAB — CULTURE, BLOOD (ROUTINE X 2): Culture: NO GROWTH

## 2023-11-17 DIAGNOSIS — N2889 Other specified disorders of kidney and ureter: Secondary | ICD-10-CM | POA: Diagnosis not present

## 2023-11-21 ENCOUNTER — Ambulatory Visit: Payer: Medicare HMO | Admitting: Family Medicine

## 2023-11-22 ENCOUNTER — Encounter: Payer: Self-pay | Admitting: Family Medicine

## 2023-11-22 ENCOUNTER — Ambulatory Visit (INDEPENDENT_AMBULATORY_CARE_PROVIDER_SITE_OTHER): Payer: Medicare HMO | Admitting: Family Medicine

## 2023-11-22 VITALS — BP 120/77 | HR 89 | Temp 98.3°F | Wt 278.8 lb

## 2023-11-22 DIAGNOSIS — L03115 Cellulitis of right lower limb: Secondary | ICD-10-CM | POA: Diagnosis not present

## 2023-11-22 DIAGNOSIS — L0390 Cellulitis, unspecified: Secondary | ICD-10-CM | POA: Diagnosis not present

## 2023-11-22 DIAGNOSIS — Z7984 Long term (current) use of oral hypoglycemic drugs: Secondary | ICD-10-CM

## 2023-11-22 DIAGNOSIS — E1159 Type 2 diabetes mellitus with other circulatory complications: Secondary | ICD-10-CM | POA: Diagnosis not present

## 2023-11-22 DIAGNOSIS — N2889 Other specified disorders of kidney and ureter: Secondary | ICD-10-CM | POA: Diagnosis not present

## 2023-11-22 DIAGNOSIS — A419 Sepsis, unspecified organism: Secondary | ICD-10-CM

## 2023-11-22 DIAGNOSIS — I129 Hypertensive chronic kidney disease with stage 1 through stage 4 chronic kidney disease, or unspecified chronic kidney disease: Secondary | ICD-10-CM

## 2023-11-22 DIAGNOSIS — E1140 Type 2 diabetes mellitus with diabetic neuropathy, unspecified: Secondary | ICD-10-CM

## 2023-11-22 LAB — BAYER DCA HB A1C WAIVED: HB A1C (BAYER DCA - WAIVED): 10.7 % — ABNORMAL HIGH (ref 4.8–5.6)

## 2023-11-22 MED ORDER — GABAPENTIN 100 MG PO CAPS: ORAL_CAPSULE | ORAL | 1 refills | Status: DC

## 2023-11-22 MED ORDER — RYBELSUS 3 MG PO TABS: 3.0000 mg | ORAL_TABLET | Freq: Every day | ORAL | Status: DC

## 2023-11-22 MED ORDER — BENAZEPRIL HCL 20 MG PO TABS: 20.0000 mg | ORAL_TABLET | Freq: Every day | ORAL | 1 refills | Status: DC

## 2023-11-22 MED ORDER — METOPROLOL TARTRATE 75 MG PO TABS: 75.0000 mg | ORAL_TABLET | Freq: Two times a day (BID) | ORAL | 1 refills | Status: DC

## 2023-11-22 MED ORDER — RYBELSUS 7 MG PO TABS: 7.0000 mg | ORAL_TABLET | Freq: Every day | ORAL | 1 refills | Status: DC

## 2023-11-22 MED ORDER — METFORMIN HCL ER 500 MG PO TB24: 1000.0000 mg | ORAL_TABLET | Freq: Two times a day (BID) | ORAL | 1 refills | Status: DC | PRN

## 2023-11-22 MED ORDER — EMPAGLIFLOZIN 25 MG PO TABS: 25.0000 mg | ORAL_TABLET | Freq: Every day | ORAL | 1 refills | Status: DC

## 2023-11-22 MED ORDER — FUROSEMIDE 20 MG PO TABS: 20.0000 mg | ORAL_TABLET | Freq: Every day | ORAL | 1 refills | Status: DC

## 2023-11-22 NOTE — Assessment & Plan Note (Signed)
 Resolved

## 2023-11-22 NOTE — Progress Notes (Signed)
 BP 120/77   Pulse 89   Temp 98.3 F (36.8 C) (Oral)   Wt 278 lb 12.8 oz (126.5 kg)   SpO2 97%   BMI 38.88 kg/m    Subjective:    Patient ID: Jeff Dasie Royetta Mickey., male    DOB: Dec 29, 1950, 73 y.o.   MRN: [id number]  HPI: Jeff Shorten. is a 73 y.o. male  Chief Complaint  Patient presents with   Cellulitis   Hospitalization Follow-up   DIABETES Hypoglycemic episodes:no Polydipsia/polyuria: no Visual disturbance: no Chest pain: no Paresthesias: yes Glucose Monitoring: no  Accucheck frequency: Not Checking Taking Insulin ?: no Blood Pressure Monitoring: rarely Retinal Examination: Up to Date Foot Exam: Up to Date Diabetic Education: Completed Pneumovax: Up to Date Influenza: Up to Date Aspirin : yes  HYPERTENSION  Hypertension status: controlled  Satisfied with current treatment? yes Duration of hypertension: chronic BP monitoring frequency:  rarely BP range:  BP medication side effects:  no Medication compliance: excellent compliance Previous BP meds: metoprolol , benazepril , lasix  Aspirin : yes Recurrent headaches: no Visual changes: no Palpitations: no Dyspnea: no Chest pain: no Lower extremity edema: no Dizzy/lightheaded: no   Transition of Care Hospital Follow up.   Hospital/Facility: [facility] D/C Physician:  Dr. Caleen D/C Date: 11/08/23  Records Requested: 11/22/23 Records Received: 11/22/23 Records Reviewed: 11/22/23  Diagnoses on Discharge:  Sepsis due to cellulitis [facility])   Chronic atrial fibrillation with RVR (HCC)   Uncontrolled type 2 diabetes mellitus with hyperglycemia (HCC)   Acute kidney injury superimposed on chronic kidney disease (HCC)   Hyponatremia   Dyslipidemia   Cellulitis of right lower extremity  Date of interactive Contact within 48 hours of discharge: 11/10/23 Contact was through: phone  Date of 7 day or 14 day face-to-face visit: 11/22/23   within 14 days  Outpatient Encounter Medications as of 11/22/2023  Medication Sig    albuterol  (VENTOLIN  HFA) 108 (90 Base) MCG/ACT inhaler Inhale 2 puffs into the lungs every 4 (four) hours as needed for wheezing or shortness of breath.   aspirin  EC 325 MG tablet Take 325 mg by mouth daily.   atorvastatin  (LIPITOR) 20 MG tablet Take 0.5 tablets (10 mg total) by mouth daily.   fluticasone  (FLONASE ) 50 MCG/ACT nasal spray Place 2 sprays into both nostrils daily as needed for allergies or rhinitis.   ondansetron  (ZOFRAN ) 4 MG tablet Take 1 tablet (4 mg total) by mouth every 6 (six) hours as needed for nausea.   rivaroxaban  (XARELTO ) 20 MG TABS tablet Take 1 tablet (20 mg total) by mouth daily.   Semaglutide  (RYBELSUS ) 3 MG TABS Take 1 tablet (3 mg total) by mouth daily.   Semaglutide  (RYBELSUS ) 7 MG TABS Take 1 tablet (7 mg total) by mouth daily.   [DISCONTINUED] benazepril  (LOTENSIN ) 20 MG tablet Take 1 tablet (20 mg total) by mouth daily. Hold for few days   [DISCONTINUED] empagliflozin  (JARDIANCE ) 25 MG TABS tablet Take 1 tablet (25 mg total) by mouth daily before breakfast.   [DISCONTINUED] furosemide  (LASIX ) 20 MG tablet Take 1 tablet (20 mg total) by mouth daily.   [DISCONTINUED] metFORMIN  (GLUCOPHAGE ) 1000 MG tablet 1,000 mg 2 (two) times daily with meals Takes 1000mg  in the morning and 1000mg  at night (500mg  tablets x2)   [DISCONTINUED] metoprolol  tartrate 75 MG TABS Take 1 tablet (75 mg total) by mouth 2 (two) times daily.   benazepril  (LOTENSIN ) 20 MG tablet Take 1 tablet (20 mg total) by mouth daily. Hold for few days   empagliflozin  (  JARDIANCE ) 25 MG TABS tablet Take 1 tablet (25 mg total) by mouth daily before breakfast.   furosemide  (LASIX ) 20 MG tablet Take 1 tablet (20 mg total) by mouth daily.   gabapentin  (NEURONTIN ) 100 MG capsule Take 1 capsule (100 mg total) by mouth at bedtime for 7 days, THEN 1 capsule (100 mg total) 2 (two) times daily for 7 days, THEN 1 capsule (100 mg total) 3 (three) times daily.   metFORMIN  (GLUCOPHAGE -XR) 500 MG 24 hr tablet Take 2  tablets (1,000 mg total) by mouth 2 (two) times daily as needed.   Metoprolol  Tartrate 75 MG TABS Take 1 tablet (75 mg total) by mouth 2 (two) times daily.   [DISCONTINUED] doxazosin  (CARDURA ) 2 MG tablet Take 1 tablet (2 mg total) by mouth 2 (two) times daily. Hold for few days (Patient not taking: Reported on 11/22/2023)   [DISCONTINUED] gabapentin  (NEURONTIN ) 100 MG capsule Take 1 capsule (100 mg total) by mouth 3 (three) times daily. (Patient not taking: Reported on 11/07/2023)   [DISCONTINUED] metFORMIN  (GLUCOPHAGE -XR) 500 MG 24 hr tablet Take 2 tablets (1,000 mg total) by mouth 2 (two) times daily as needed. (Patient not taking: Reported on 11/22/2023)   No facility-administered encounter medications on file as of 11/22/2023.  Per Hospitalist: Jeff Dawn Breylan Lefevers. is 73 y.o. male with heart failure, diabetes, hypertension, dyslipidemia, chronic A-fib, chronic DVT, who presented to the ER with acute onset right lower extremity pain and swelling.  Patient also endorsed fever at home.  He denies any recent trauma to the area.  He does endorse that his heels have recently been cracking. Within the ED initial blood pressure was 81/59 with tachycardia.  It improved with hydration.  CBC revealed leukocytosis of 12.1.  Lactic acidosis resolved on repeat.  EKG in ED revealed A-fib with RVR, lower extremity Doppler negative for DVT.    Patient initially received broad-spectrum antibiotics and 3 L of IV fluid and later continued on vancomycin  and ceftriaxone  for right lower extremity cellulitis.  Continue to improve.  Pain has been improved.  Improving creatinine and leukocytosis.   Patient would like to go home, he was given Keflex  and doxycycline  for 1 week.  He was also advised to hold his home antihypertensives for next couple of days as blood pressure has been within goal at this time and it was soft on admission.  He can resume his medications once blood pressure started trending up.   We increased the  home dose of metoprolol  for better control of heart rate.  He will continue with rest of his home medications and need to have a close follow-up with his primary care provider for further management.  Diagnostic Tests Reviewed:  CLINICAL DATA:  abdominal pain   EXAM: CHEST - 2 VIEW   COMPARISON:  None Available.   FINDINGS: The heart size and mediastinal contours are within normal limits. Both lungs are clear. The visualized skeletal structures are unremarkable.   IMPRESSION: No active cardiopulmonary disease.  Narrative & Impression  CLINICAL DATA:  Right leg pain and swelling, initial encounter   EXAM: RIGHT LOWER EXTREMITY VENOUS DOPPLER ULTRASOUND   TECHNIQUE: Gray-scale sonography with graded compression, as well as color Doppler and duplex ultrasound were performed to evaluate the lower extremity deep venous systems from the level of the common femoral vein and including the common femoral, femoral, profunda femoral, popliteal and calf veins including the posterior tibial, peroneal and gastrocnemius veins when visible. The superficial great saphenous vein was also interrogated.  Spectral Doppler was utilized to evaluate flow at rest and with distal augmentation maneuvers in the common femoral, femoral and popliteal veins.   COMPARISON:  None Available.   FINDINGS: Contralateral Common Femoral Vein: Respiratory phasicity is normal and symmetric with the symptomatic side. No evidence of thrombus. Normal compressibility.   Common Femoral Vein: No evidence of thrombus. Normal compressibility, respiratory phasicity and response to augmentation.   Saphenofemoral Junction: No evidence of thrombus. Normal compressibility and flow on color Doppler imaging.   Profunda Femoral Vein: No evidence of thrombus. Normal compressibility and flow on color Doppler imaging.   Femoral Vein: No evidence of thrombus. Normal compressibility, respiratory phasicity and response to  augmentation.   Popliteal Vein: No evidence of thrombus. Normal compressibility, respiratory phasicity and response to augmentation.   Calf Veins: No evidence of thrombus. Normal compressibility and flow on color Doppler imaging.   Superficial Great Saphenous Vein: No evidence of thrombus. Normal compressibility.   Venous Reflux:  None.   Other Findings: Reactive lymph nodes are noted in the right inguinal region.   IMPRESSION: No evidence of deep venous thrombosis.   Disposition: Home  Consults: None  Discharge Instructions: Please obtain CBC and BMP on follow-up Please ensure the completion of antibiotics Patient was told to hold antihypertensives for next couple of days and keep checking his blood pressure, he can restart if it started trending up. Follow-up with primary care provider within a week  Disease/illness Education: discussed today  Home Health/Community Services Discussions/Referrals: N/A  Establishment or re-establishment of referral orders for community resources: N/A  Discussion with other health care providers: N/A  Assessment and Support of treatment regimen adherence: Good  Appointments Coordinated with: Patient  Education for self-management, independent living, and ADLs: Discussed today  Since getting out of the hospital Jeff [name] has been feeling better. He finished his antibiotic. He has no more swelling. No more pain. No other concerns.   Relevant past medical, surgical, family and social history reviewed and updated as indicated. Interim medical history since our last visit reviewed. Allergies and medications reviewed and updated.  Review of Systems  Constitutional: Negative.   Respiratory: Negative.    Cardiovascular: Negative.   Gastrointestinal: Negative.   Musculoskeletal: Negative.   Neurological:  Positive for numbness. Negative for dizziness, tremors, seizures, syncope, facial asymmetry, speech difficulty, weakness, light-headedness and  headaches.  Psychiatric/Behavioral: Negative.      Per HPI unless specifically indicated above     Objective:    BP 120/77   Pulse 89   Temp 98.3 F (36.8 C) (Oral)   Wt 278 lb 12.8 oz (126.5 kg)   SpO2 97%   BMI 38.88 kg/m   Wt Readings from Last 3 Encounters:  11/22/23 278 lb 12.8 oz (126.5 kg)  11/06/23 270 lb (122.5 kg)  10/04/23 273 lb (123.8 kg)    Physical Exam Vitals and nursing note reviewed.  Constitutional:      General: He is not in acute distress.    Appearance: Normal appearance. He is not ill-appearing, toxic-appearing or diaphoretic.  HENT:     Head: Normocephalic and atraumatic.     Right Ear: External ear normal.     Left Ear: External ear normal.     Nose: Nose normal.     Mouth/Throat:     Mouth: Mucous membranes are moist.     Pharynx: Oropharynx is clear.  Eyes:     General: No scleral icterus.       Right eye: No discharge.  Left eye: No discharge.     Extraocular Movements: Extraocular movements intact.     Conjunctiva/sclera: Conjunctivae normal.     Pupils: Pupils are equal, round, and reactive to light.  Cardiovascular:     Rate and Rhythm: Normal rate and regular rhythm.     Pulses: Normal pulses.     Heart sounds: Normal heart sounds. No murmur heard.    No friction rub. No gallop.  Pulmonary:     Effort: Pulmonary effort is normal. No respiratory distress.     Breath sounds: Normal breath sounds. No stridor. No wheezing, rhonchi or rales.  Chest:     Chest wall: No tenderness.  Musculoskeletal:        General: Normal range of motion.     Cervical back: Normal range of motion and neck supple.     Right lower leg: No edema.     Left lower leg: No edema.  Skin:    General: Skin is warm and dry.     Capillary Refill: Capillary refill takes less than 2 seconds.     Coloration: Skin is not jaundiced or pale.     Findings: No bruising, erythema, lesion or rash.  Neurological:     General: No focal deficit present.     Mental  Status: He is alert and oriented to person, place, and time. Mental status is at baseline.  Psychiatric:        Mood and Affect: Mood normal.        Behavior: Behavior normal.        Thought Content: Thought content normal.        Judgment: Judgment normal.     Results for orders placed or performed during the hospital encounter of 11/06/23  Resp panel by RT-PCR (RSV, Flu A&B, Covid) Anterior Nasal Swab   Collection Time: 11/06/23 10:32 PM   Specimen: Anterior Nasal Swab  Result Value Ref Range   SARS Coronavirus 2 by RT PCR NEGATIVE NEGATIVE   Influenza A by PCR NEGATIVE NEGATIVE   Influenza B by PCR NEGATIVE NEGATIVE   Resp Syncytial Virus by PCR NEGATIVE NEGATIVE  Lipase, blood   Collection Time: 11/06/23 10:32 PM  Result Value Ref Range   Lipase 27 11 - 51 U/L  Comprehensive metabolic panel   Collection Time: 11/06/23 10:32 PM  Result Value Ref Range   Sodium 129 (L) 135 - 145 mmol/L   Potassium 4.2 3.5 - 5.1 mmol/L   Chloride 90 (L) 98 - 111 mmol/L   CO2 23 22 - 32 mmol/L   Glucose, Bld 298 (H) 70 - 99 mg/dL   BUN 33 (H) 8 - 23 mg/dL   Creatinine, Ser 8.21 (H) 0.61 - 1.24 mg/dL   Calcium  8.3 (L) 8.9 - 10.3 mg/dL   Total Protein 6.7 6.5 - 8.1 g/dL   Albumin 3.5 3.5 - 5.0 g/dL   AST 22 15 - 41 U/L   ALT 17 0 - 44 U/L   Alkaline Phosphatase 76 38 - 126 U/L   Total Bilirubin 1.5 (H) <1.2 mg/dL   GFR, Estimated 40 (L) >60 mL/min   Anion gap 16 (H) 5 - 15  CBC   Collection Time: 11/06/23 10:32 PM  Result Value Ref Range   WBC 12.1 (H) 4.0 - 10.5 K/uL   RBC 5.58 4.22 - 5.81 MIL/uL   Hemoglobin 15.9 13.0 - 17.0 g/dL   HCT 51.4 60.9 - 47.9 %   MCV 86.9 80.0 - 100.0 fL  MCH 28.5 26.0 - 34.0 pg   MCHC 32.8 30.0 - 36.0 g/dL   RDW 86.4 88.4 - 84.4 %   Platelets 153 150 - 400 K/uL   nRBC 0.0 0.0 - 0.2 %  Protime-INR (order if Patient is taking Coumadin / Warfarin)   Collection Time: 11/06/23 10:32 PM  Result Value Ref Range   Prothrombin Time 17.0 (H) 11.4 - 15.2  seconds   INR 1.4 (H) 0.8 - 1.2  Troponin I (High Sensitivity)   Collection Time: 11/06/23 10:32 PM  Result Value Ref Range   Troponin I (High Sensitivity) 16 <18 ng/L  Blood Culture (routine x 2)   Collection Time: 11/06/23 11:43 PM   Specimen: BLOOD  Result Value Ref Range   Specimen Description BLOOD RIGTH ARM    Special Requests      BOTTLES DRAWN AEROBIC AND ANAEROBIC Blood Culture results may not be optimal due to an inadequate volume of blood received in culture bottles   Culture      NO GROWTH 5 DAYS Performed at [facility], [address] [place], [place] [address]    Report Status 11/12/2023 FINAL   Blood Culture (routine x 2)   Collection Time: 11/06/23 11:43 PM   Specimen: BLOOD  Result Value Ref Range   Specimen Description BLOOD LEFT ARM    Special Requests      BOTTLES DRAWN AEROBIC AND ANAEROBIC Blood Culture adequate volume   Culture      NO GROWTH 5 DAYS Performed at [facility], [address] [place], [place] [address]    Report Status 11/11/2023 FINAL   Urinalysis, Routine w reflex microscopic -Urine, Clean Catch   Collection Time: 11/06/23 11:43 PM  Result Value Ref Range   Color, Urine YELLOW (A) YELLOW   APPearance CLEAR (A) CLEAR   Specific Gravity, Urine 1.022 1.005 - 1.030   pH 5.0 5.0 - 8.0   Glucose, UA >=500 (A) NEGATIVE mg/dL   Hgb urine dipstick SMALL (A) NEGATIVE   Bilirubin Urine NEGATIVE NEGATIVE   Ketones, ur 20 (A) NEGATIVE mg/dL   Protein, ur 30 (A) NEGATIVE mg/dL   Nitrite NEGATIVE NEGATIVE   Leukocytes,Ua NEGATIVE NEGATIVE   RBC / HPF 0 0 - 5 RBC/hpf   WBC, UA 0-5 0 - 5 WBC/hpf   Bacteria, UA RARE (A) NONE SEEN   Squamous Epithelial / HPF 0 0 - 5 /HPF  Lactic acid, plasma   Collection Time: 11/06/23 11:43 PM  Result Value Ref Range   Lactic Acid, Venous 2.3 (HH) 0.5 - 1.9 mmol/L  APTT   Collection Time: 11/06/23 11:43 PM  Result Value Ref Range   aPTT 30 24 - 36 seconds  Lactic acid, plasma    Collection Time: 11/07/23  1:00 AM  Result Value Ref Range   Lactic Acid, Venous 1.4 0.5 - 1.9 mmol/L  Troponin I (High Sensitivity)   Collection Time: 11/07/23  1:00 AM  Result Value Ref Range   Troponin I (High Sensitivity) 15 <18 ng/L  Protime-INR   Collection Time: 11/07/23  5:00 AM  Result Value Ref Range   Prothrombin Time 15.6 (H) 11.4 - 15.2 seconds   INR 1.2 0.8 - 1.2  Cortisol-am, blood   Collection Time: 11/07/23  5:00 AM  Result Value Ref Range   Cortisol - AM 10.6 6.7 - 22.6 ug/dL  Basic metabolic panel   Collection Time: 11/07/23  5:00 AM  Result Value Ref Range   Sodium 131 (L) 135 -  145 mmol/L   Potassium 4.3 3.5 - 5.1 mmol/L   Chloride 97 (L) 98 - 111 mmol/L   CO2 19 (L) 22 - 32 mmol/L   Glucose, Bld 284 (H) 70 - 99 mg/dL   BUN 33 (H) 8 - 23 mg/dL   Creatinine, Ser 8.44 (H) 0.61 - 1.24 mg/dL   Calcium  7.5 (L) 8.9 - 10.3 mg/dL   GFR, Estimated 47 (L) >60 mL/min   Anion gap 15 5 - 15  CBC   Collection Time: 11/07/23  5:00 AM  Result Value Ref Range   WBC 11.5 (H) 4.0 - 10.5 K/uL   RBC 4.96 4.22 - 5.81 MIL/uL   Hemoglobin 14.1 13.0 - 17.0 g/dL   HCT 56.4 60.9 - 47.9 %   MCV 87.7 80.0 - 100.0 fL   MCH 28.4 26.0 - 34.0 pg   MCHC 32.4 30.0 - 36.0 g/dL   RDW 86.2 88.4 - 84.4 %   Platelets 125 (L) 150 - 400 K/uL   nRBC 0.0 0.0 - 0.2 %  CBG monitoring, ED   Collection Time: 11/07/23  7:39 AM  Result Value Ref Range   Glucose-Capillary 258 (H) 70 - 99 mg/dL  CBG monitoring, ED   Collection Time: 11/07/23 11:38 AM  Result Value Ref Range   Glucose-Capillary 108 (H) 70 - 99 mg/dL  CBG monitoring, ED   Collection Time: 11/07/23  4:11 PM  Result Value Ref Range   Glucose-Capillary 124 (H) 70 - 99 mg/dL  CBG monitoring, ED   Collection Time: 11/07/23 10:36 PM  Result Value Ref Range   Glucose-Capillary 97 70 - 99 mg/dL  MRSA Next Gen by PCR, Nasal   Collection Time: 11/08/23  5:06 AM   Specimen: Nasal Mucosa; Nasal Swab  Result Value Ref Range   MRSA by  PCR Next Gen NOT DETECTED NOT DETECTED  CBG monitoring, ED   Collection Time: 11/08/23  7:13 AM  Result Value Ref Range   Glucose-Capillary 128 (H) 70 - 99 mg/dL  Creatinine, serum   Collection Time: 11/08/23 12:59 PM  Result Value Ref Range   Creatinine, Ser 1.55 (H) 0.61 - 1.24 mg/dL   GFR, Estimated 47 (L) >60 mL/min  CBG monitoring, ED   Collection Time: 11/08/23  1:02 PM  Result Value Ref Range   Glucose-Capillary 112 (H) 70 - 99 mg/dL      Assessment & Plan:   Problem List Items Addressed This Visit       Endocrine   Type 2 diabetes mellitus with diabetic neuropathy, unspecified (HCC) - Primary   Not doing well with A1c up to 10.7 from 8.2. Will add rybelsus , continue jardiance  and recheck in 3 months. Will work on diet and exercise. Call with any concerns. Will get him back on gabapentin  and refer him to podiatry. Call with any concerns.       Relevant Medications   benazepril  (LOTENSIN ) 20 MG tablet   empagliflozin  (JARDIANCE ) 25 MG TABS tablet   metFORMIN  (GLUCOPHAGE -XR) 500 MG 24 hr tablet   Semaglutide  (RYBELSUS ) 3 MG TABS   Semaglutide  (RYBELSUS ) 7 MG TABS   Other Relevant Orders   CBC with Differential/Platelet   Basic metabolic panel   Bayer DCA Hb J8r Waived   Ambulatory referral to Podiatry     Genitourinary   Hypertensive renal disease   Under good control on current regimen. Continue current regimen. Continue to monitor. Call with any concerns. Refills given. Labs drawn today.  Relevant Orders   CBC with Differential/Platelet   Basic metabolic panel     Other   Sepsis due to cellulitis (HCC)   Resolved.       Cellulitis of right lower extremity   Resolved.       Long term current use of oral hypoglycemic drug   Right kidney mass   Recently found on CT scan. Following with interventional radiology. Offered referral to urology- will hold for now. Call with any concerns.         Follow up plan: Return in about 3 months (around  02/20/2024).

## 2023-11-22 NOTE — Assessment & Plan Note (Signed)
 Recently found on CT scan. Following with interventional radiology. Offered referral to urology- will hold for now. Call with any concerns.

## 2023-11-22 NOTE — Assessment & Plan Note (Addendum)
 Not doing well with A1c up to 10.7 from 8.2. Will add rybelsus, continue jardiance and recheck in 3 months. Will work on diet and exercise. Call with any concerns. Will get him back on gabapentin and refer him to podiatry. Call with any concerns.

## 2023-11-22 NOTE — Assessment & Plan Note (Signed)
 Under good control on current regimen. Continue current regimen. Continue to monitor. Call with any concerns. Refills given. Labs drawn today.

## 2023-11-23 ENCOUNTER — Other Ambulatory Visit: Payer: Self-pay | Admitting: Family Medicine

## 2023-11-23 LAB — BASIC METABOLIC PANEL
BUN/Creatinine Ratio: 15 (ref 10–24)
BUN: 18 mg/dL (ref 8–27)
CO2: 23 mmol/L (ref 20–29)
Calcium: 8.5 mg/dL — ABNORMAL LOW (ref 8.6–10.2)
Chloride: 98 mmol/L (ref 96–106)
Creatinine, Ser: 1.19 mg/dL (ref 0.76–1.27)
Glucose: 282 mg/dL — ABNORMAL HIGH (ref 70–99)
Potassium: 4.6 mmol/L (ref 3.5–5.2)
Sodium: 136 mmol/L (ref 134–144)
eGFR: 65 mL/min/{1.73_m2} (ref >59)

## 2023-11-23 LAB — CBC WITH DIFFERENTIAL/PLATELET
Basophils Absolute: 0.1 10*3/uL (ref 0.0–0.2)
Basos: 1 %
EOS (ABSOLUTE): 0.1 10*3/uL (ref 0.0–0.4)
Eos: 1 %
Hematocrit: 41 % (ref 37.5–51.0)
Hemoglobin: 12.9 g/dL — ABNORMAL LOW (ref 13.0–17.7)
Immature Grans (Abs): 0.1 10*3/uL (ref 0.0–0.1)
Immature Granulocytes: 1 %
Lymphocytes Absolute: 0.9 10*3/uL (ref 0.7–3.1)
Lymphs: 13 %
MCH: 28.2 pg (ref 26.6–33.0)
MCHC: 31.5 g/dL (ref 31.5–35.7)
MCV: 90 fL (ref 79–97)
Monocytes Absolute: 0.6 10*3/uL (ref 0.1–0.9)
Monocytes: 9 %
Neutrophils Absolute: 5 10*3/uL (ref 1.4–7.0)
Neutrophils: 75 %
Platelets: 300 10*3/uL (ref 150–450)
RBC: 4.57 x10E6/uL (ref 4.14–5.80)
RDW: 12.9 % (ref 11.6–15.4)
WBC: 6.8 10*3/uL (ref 3.4–10.8)

## 2023-11-24 NOTE — Telephone Encounter (Signed)
Requested medication (s) are due for refill today - no  Requested medication (s) are on the active medication list -yes  Future visit scheduled -yes  Last refill: 11/22/23 #90 1RF  Notes to clinic:   Pharmacy comment: Alternative Requested:NEEDS PA.  Off protocol- provider review   Requested Prescriptions  Pending Prescriptions Disp Refills   RYBELSUS 7 MG TABS [Pharmacy Med Name: RYBELSUS 7 MG TABLET] 90 tablet 1    Sig: Take 1 tablet (7 mg total) by mouth daily.     Off-Protocol Failed - 11/24/2023 11:07 AM      Failed - Medication not assigned to a protocol, review manually.      Passed - Valid encounter within last 12 months    Recent Outpatient Visits           2 days ago Type 2 diabetes mellitus with diabetic neuropathy, without long-term current use of insulin (HCC)   [place] [facility] [place], Megan P, DO   1 month ago Facial droop   [place] [facility] [place], Megan P, DO   3 months ago Routine general medical examination at a health care facility   [facility] [place], Megan P, DO   3 months ago COVID   [facility] [place], Oswaldo Conroy, PA-C   3 months ago [place] and inaccessibility of telehealth   [place] [facility] [place], [place], DO       Future Appointments             In 1 week Gollan, Tollie Pizza, MD [facility] [place]   In 3 months Dorcas Carrow, DO [place] [facility]               Requested Prescriptions  Pending Prescriptions Disp Refills   RYBELSUS 7 MG TABS [Pharmacy Med Name: RYBELSUS 7 MG TABLET] 90 tablet 1    Sig: Take 1 tablet (7 mg total) by mouth daily.     Off-Protocol Failed - 11/24/2023 11:07 AM      Failed - Medication not assigned to a protocol, review manually.      Passed - Valid encounter within last 12 months    Recent Outpatient Visits           2 days ago Type 2  diabetes mellitus with diabetic neuropathy, without long-term current use of insulin (HCC)   [place] [facility] [place], Megan P, DO   1 month ago Facial droop   [place] [facility] [place], Megan P, DO   3 months ago Routine general medical examination at a health care facility   [facility] [place], Megan P, DO   3 months ago COVID   [facility] [place], Oswaldo Conroy, PA-C   3 months ago [place] and inaccessibility of telehealth   [place] [facility] [place], [place], DO       Future Appointments             In 1 week Gollan, Tollie Pizza, MD [facility] [place]   In 3 months Dorcas Carrow, DO  [facility], [facility]

## 2023-12-01 DIAGNOSIS — C641 Malignant neoplasm of right kidney, except renal pelvis: Secondary | ICD-10-CM | POA: Diagnosis not present

## 2023-12-01 DIAGNOSIS — N2889 Other specified disorders of kidney and ureter: Secondary | ICD-10-CM | POA: Diagnosis not present

## 2023-12-01 NOTE — Progress Notes (Signed)
Evaluation Performed:  Follow-up visit  Date:  12/02/2023   ID:  Jeff Mealing., DOB 11-27-1950, MRN [id number]  Patient Location:  [address] [place] [address]   Provider location:   [facility], [place] office  PCP:  Dorcas Carrow, DO  Cardiologist:  [facility]  Chief Complaint  Patient presents with   12 month follow up     "Doing well."     History of Present Illness:    Jeff [name]. is a 73 y.o. male  past medical history of DM2  hemoglobin A1c 7.7 DVT LLE 2010, with filter (removed) hyperlipidemia  April 2011  atrial fibrillation with RVR,  emergency room on April 15 2010 for rapid atrial fibrillation,  s/p cardioversion   lower extremity edema On calcium channel blockers,    Echo in 02/2010 shows EF 40-45%,  possibly tachycardia induced  Converted back to atrial fibrillation in 2016, at that time did not want cardioversion (CHF, HTN, age x 1, DM, vascular disease): IVC stenosis, [facility]  he presents for routine followup for chronic atrial fibrillation, and extensive DVT  LOV July 2023 Recent hospitalization December 2024 for leg cellulitis Required IV antibiotics for several days Feels he has recovered, still little bit tired  Chronic leg swelling, brawny discoloration from prior leg swelling and DVTs  Diagnosed with right renal mass, scheduled for possible cryoablation at [facility]  Reports he is taking Xarelto 20 daily with aspirin 325 daily By his report, was told by vascular at [facility] to stay on high-dose aspirin  Denies any bleeding, no falls  Denies chest pain concerning for angina No PND orthopnea  Other past medical history reviewed In the hospital October 2022 went to his dermatologist office October 2022 and they felt his left leg was much more swollen than the right and told him to be evaluated in the ER  There was concern for recurrent DVT.  Vascular surgery was  consulted.  Hematology was also consulted. Venous ultrasound was obtained revealing 1. Stable chronic nonocclusive post thrombotic changes in the popliteal and femoral veins. No acute DVT, venogram October 14 no new thrombus Was treated for cellulitis left thigh with IV antibiotics  Followed by hematology and vascular Resumed on Xarelto  [facility] on 08/11/19 and diagnosed with COVID 19. He was started on prednisone which elevated his blood sugar.  He was not admitted. He went to [facility] ER on Tuesday 10/6 at 6 am and sat in the ER in a cubicle until 10 pm that night, even being COVID +.  stayed there 6 days and was started on remdesivir and antibodies trial diagnosed with pneumonia as well.  discharged/ quarantined for 20 days.   Chest pain in ER 12/2018 Hospital records reviewed with the patient in detail SOB, and coughing Took Mucinex and Zyrtec.  He thinks maybe it is a reaction to both of the medicines  chest tightness and warmth associated with some clammy feeling. Had pressure 185/93 Pulse 127  08/2018  Sepsis secondary to cellulitis of the left lower extremity and streptococcus bacteremia: Hospital records reviewed with the patient in detail   History of DVT left lower extremity  thrombus was occlusive left SFA down to popliteal   IVC filter in place from 2010   As outpatient seen by Dr. Lorretta Harp, "too much clot" Recommended lymphedema compression pumps   Referred to [facility] for thrombectomy  had thrombectomy  procedure   He has had follow-up lower extremity CT scan at [facility] Confirming residual B/l extensive clot, iliacs extending downward   Past Medical History:  Diagnosis Date   Chronic deep vein thrombosis (DVT) (HCC)    Chronic low back pain    Chronic systolic heart failure (HCC)    a. TTE 2011 with EF 40-45% per notes   Diabetes mellitus without complication [facility])    Erectile dysfunction    HLD (hyperlipidemia)    Hypertension    Hypogonadism in male    Permanent  atrial fibrillation (HCC)    a. s/p DCCV 2011; b. redeveloped Afib in 2016; c. CHADS2VASc at least 5 (CHF, HTN, age x 1, DM, vascular disease); d. on Xarelto   Torn meniscus    right knee   Past Surgical History:  Procedure Laterality Date   COLONOSCOPY WITH PROPOFOL N/A 05/28/2016   Procedure: COLONOSCOPY WITH PROPOFOL;  Surgeon: Midge Minium, MD;  Location: [facility];  Service: Endoscopy;  Laterality: N/A;  diabetic - oral meds   DVT, leg Left    INSERTION OF VENA CAVA FILTER  2014   KNEE ARTHROSCOPY WITH MEDIAL MENISECTOMY Right 05/31/2019   Procedure: KNEE ARTHROSCOPY WITH partial  MEDIAL MENISECTOMY;  Surgeon: Signa Kell, MD;  Location: [facility];  Service: Orthopedics;  Laterality: Right;   LOWER EXTREMITY VENOGRAPHY Left 08/21/2021   Procedure: LOWER EXTREMITY VENOGRAPHY;  Surgeon: Renford Dills, MD;  Location: [facility] INVASIVE CV LAB;  Service: Cardiovascular;  Laterality: Left;  Possible IVC Filter Insertion   PERIPHERAL VASCULAR THROMBECTOMY Left 12/01/2017   Procedure: PERIPHERAL VASCULAR THROMBECTOMY;  Surgeon: Renford Dills, MD;  Location: [facility] INVASIVE CV LAB;  Service: Cardiovascular;  Laterality: Left;     Current Meds  Medication Sig   albuterol (VENTOLIN HFA) 108 (90 Base) MCG/ACT inhaler Inhale 2 puffs into the lungs every 4 (four) hours as needed for wheezing or shortness of breath.   aspirin EC 325 MG tablet Take 325 mg by mouth daily.   atorvastatin (LIPITOR) 20 MG tablet Take 0.5 tablets (10 mg total) by mouth daily.   benazepril (LOTENSIN) 20 MG tablet Take 1 tablet (20 mg total) by mouth daily. Hold for few days   empagliflozin (JARDIANCE) 25 MG TABS tablet Take 1 tablet (25 mg total) by mouth daily before breakfast.   fluticasone (FLONASE) 50 MCG/ACT nasal spray Place 2 sprays into both nostrils daily as needed for allergies or rhinitis.   furosemide (LASIX) 20 MG tablet Take 1 tablet (20 mg total) by mouth daily.   gabapentin (NEURONTIN)  100 MG capsule Take 1 capsule (100 mg total) by mouth at bedtime for 7 days, THEN 1 capsule (100 mg total) 2 (two) times daily for 7 days, THEN 1 capsule (100 mg total) 3 (three) times daily.   metFORMIN (GLUCOPHAGE-XR) 500 MG 24 hr tablet Take 2 tablets (1,000 mg total) by mouth 2 (two) times daily as needed.   Metoprolol Tartrate 75 MG TABS Take 1 tablet (75 mg total) by mouth 2 (two) times daily.   ondansetron (ZOFRAN) 4 MG tablet Take 1 tablet (4 mg total) by mouth every 6 (six) hours as needed for nausea.   rivaroxaban (XARELTO) 20 MG TABS tablet Take 1 tablet (20 mg total) by mouth daily.   Semaglutide (RYBELSUS) 3 MG TABS Take 1 tablet (3 mg total) by mouth daily.   Semaglutide (RYBELSUS) 7 MG TABS Take 1 tablet (7 mg total) by mouth daily.     Allergies:  Penicillins   Social History   Tobacco Use   Smoking status: Never   Smokeless tobacco: Never  Vaping Use   Vaping status: Never Used  Substance Use Topics   Alcohol use: No    Alcohol/week: 0.0 standard drinks of alcohol   Drug use: No     Family Hx: The patient's family history includes Atrial fibrillation in his brother; Diabetes in his father; Diabetes Mellitus II in an other family member; Heart failure in his father; Lymphoma in his mother. There is no history of Heart attack, Hypertension, Cancer, COPD, Stroke, Prostate cancer, Kidney cancer, or Bladder Cancer.  ROS:   Please see the history of present illness.    Review of Systems  Constitutional: Negative.   Respiratory: Negative.    Cardiovascular: Negative.   Gastrointestinal: Negative.   Musculoskeletal: Negative.   Neurological: Negative.   Psychiatric/Behavioral: Negative.    All other systems reviewed and are negative.    Labs/Other Tests and Data Reviewed:    Recent Labs: 08/19/2023: TSH 2.770 11/06/2023: ALT 17 11/22/2023: BUN 18; Creatinine, Ser 1.19; Hemoglobin 12.9; Platelets 300; Potassium 4.6; Sodium 136   Recent Lipid Panel Lab Results   Component Value Date/Time   CHOL 96 (L) 08/19/2023 08:50 AM   CHOL 130 09/07/2018 08:22 AM   TRIG 56 08/19/2023 08:50 AM   TRIG 95 09/07/2018 08:22 AM   TRIG 111 02/17/2010 12:00 AM   HDL 28 (L) 08/19/2023 08:50 AM   CHOLHDL 4.0 10/06/2019 12:53 PM   LDLCALC 55 08/19/2023 08:50 AM    Wt Readings from Last 3 Encounters:  12/02/23 274 lb 4 oz (124.4 kg)  11/22/23 278 lb 12.8 oz (126.5 kg)  11/06/23 270 lb (122.5 kg)     Exam:    Vital Signs: Vital signs may also be detailed in the HPI BP (!) 140/78 (BP Location: Left Arm, Patient Position: Sitting, Cuff Size: Normal)   Pulse 72   Ht 5\' 11"  (1.803 m)   Wt 274 lb 4 oz (124.4 kg)   BMI 38.25 kg/m   Constitutional:  oriented to person, place, and time. No distress.  HENT:  Head: Grossly normal Eyes:  no discharge. No scleral icterus.  Neck: No JVD, no carotid bruits  Cardiovascular: Regular rate and rhythm, no murmurs appreciated Pulmonary/Chest: Clear to auscultation bilaterally, no wheezes or rails Abdominal: Soft.  no distension.  no tenderness.  Musculoskeletal: Normal range of motion Neurological:  normal muscle tone. Coordination normal. No atrophy Skin: Skin warm and dry Psychiatric: normal affect, pleasant  ASSESSMENT & PLAN:    Preop cardiovascular evaluation Reports he will be scheduled for cryoablation at [facility] Would likely need to stop Xarelto 2 to 3 days prior to procedure and following procedure.  No further cardiac testing needed On these days would wear compression hose  Atrial fibrillation, permanent Continue Xarelto 20 daily Asymptomatic, rate controlled without medication  Congestive dilated cardiomyopathy (HCC), diastolic EF 50 to 55% on prior echo 2019 Off Lasix Not on Farxiga, secondary to cost Appears euvolemic  Type 2 diabetes mellitus with other circulatory complication, without long-term current use of insulin (HCC) A1c higher at 10.7 Diet discussed Unable to afford  Rybelsus  HYPERTENSION, BENIGN Blood pressure is well controlled on today's visit. No changes made to the medications.  Mixed hyperlipidemia Cholesterol is at goal on the current lipid regimen. No changes to the medications were made.  Chronic renal insufficiency Long history of diabetes,  Avoid NSAIDs, discussed A1c as detailed above Most recent  renal function improved off Lasix  History of DVT continue Xarelto, recommend aspirin down to 81 daily    Signed, Julien Nordmann, MD  12/02/2023 9:51 AM    [facility] [address], [place], [place] [address]

## 2023-12-02 ENCOUNTER — Encounter: Payer: Self-pay | Admitting: Cardiovascular Disease

## 2023-12-02 ENCOUNTER — Ambulatory Visit: Payer: Medicare HMO | Attending: Cardiovascular Disease | Admitting: Cardiovascular Disease

## 2023-12-02 ENCOUNTER — Other Ambulatory Visit: Payer: Self-pay | Admitting: Family Medicine

## 2023-12-02 VITALS — BP 140/78 | HR 72 | Ht 71.0 in | Wt 274.2 lb

## 2023-12-02 DIAGNOSIS — I420 Dilated cardiomyopathy: Secondary | ICD-10-CM | POA: Diagnosis not present

## 2023-12-02 DIAGNOSIS — I82220 Acute embolism and thrombosis of inferior vena cava: Secondary | ICD-10-CM | POA: Diagnosis not present

## 2023-12-02 DIAGNOSIS — E1140 Type 2 diabetes mellitus with diabetic neuropathy, unspecified: Secondary | ICD-10-CM

## 2023-12-02 DIAGNOSIS — I482 Chronic atrial fibrillation, unspecified: Secondary | ICD-10-CM

## 2023-12-02 DIAGNOSIS — E782 Mixed hyperlipidemia: Secondary | ICD-10-CM | POA: Diagnosis not present

## 2023-12-02 DIAGNOSIS — I871 Compression of vein: Secondary | ICD-10-CM | POA: Diagnosis not present

## 2023-12-02 DIAGNOSIS — I82412 Acute embolism and thrombosis of left femoral vein: Secondary | ICD-10-CM

## 2023-12-02 NOTE — Patient Instructions (Signed)
Medication Instructions:  No changes  Consider changing asa to 81 mg daily  If you need a refill on your cardiac medications before your next appointment, please call your pharmacy.   Lab work: No new labs needed  Testing/Procedures: No new testing needed  Follow-Up: At [facility], you and your health needs are our priority.  As part of our continuing mission to provide you with exceptional heart care, we have created designated Provider Care Teams.  These Care Teams include your primary Cardiologist (physician) and Advanced Practice Providers (APPs -  Physician Assistants and Nurse Practitioners) who all work together to provide you with the care you need, when you need it.  You will need a follow up appointment in 12 months  Providers on your designated Care Team:   Nicolasa Ducking, NP Eula Listen, PA-C Cadence Fransico Michael, [place]  COVID-19 Vaccine Information can be found at: [email or web address] For questions related to vaccine distribution or appointments, please email [email or web address] or call [phone].

## 2023-12-05 ENCOUNTER — Telehealth: Payer: Self-pay

## 2023-12-05 NOTE — Progress Notes (Signed)
Care Guide Pharmacy Note  12/05/2023 Name: Jarriel Papillion. MRN: [id number] DOB: 05/02/1951  Referred By: Dorcas Carrow, DO Reason for referral: Care Coordination (Outreach to schedule with pharm d )   [name] Derry Kassel. is a 73 y.o. year old male who is a primary care patient of Dorcas Carrow, DO.  [employer]. was referred to the pharmacist for assistance related to: DMII  Successful contact was made with the patient to discuss pharmacy services including being ready for the pharmacist to call at least 5 minutes before the scheduled appointment time and to have medication bottles and any blood pressure readings ready for review. The patient agreed to meet with the pharmacist via telephone visit on (date/time).12/06/2023  Penne Lash , RMA     [place]  [facility], [facility] Guide  Direct Dial: [phone]  Website: [email or web address]

## 2023-12-06 ENCOUNTER — Ambulatory Visit: Payer: Medicare HMO | Admitting: Podiatry

## 2023-12-06 ENCOUNTER — Encounter: Payer: Self-pay | Admitting: Podiatry

## 2023-12-06 ENCOUNTER — Other Ambulatory Visit: Payer: Self-pay

## 2023-12-06 VITALS — Ht 71.0 in | Wt 274.2 lb

## 2023-12-06 DIAGNOSIS — L853 Xerosis cutis: Secondary | ICD-10-CM

## 2023-12-06 MED ORDER — AMMONIUM LACTATE 12 % EX LOTN: 1.0000 | TOPICAL_LOTION | CUTANEOUS | 0 refills | Status: AC | PRN

## 2023-12-06 NOTE — Progress Notes (Signed)
Subjective:  Patient ID: Jeff Mealing., male    DOB: 08-28-1951,  MRN: [id number]  Chief Complaint  Patient presents with   Foot Pain    Pt is here due to the cracking of his bilateral heels, wants to see what can be done to help.    73 y.o. male presents with the above complaint.  Patient presents with complaint of bilateral severe dryness with some cracked fissure.  Patient is a diabetic last A1c of 10.6%.  He wanted get it evaluated he has not had any treatment options for this.  He tried some over-the-counter stuff which has not helped.  He has history of cellulitis to both legs.  Pain scale is 0 out of 10.   Review of Systems: Negative except as noted in the HPI. Denies N/V/F/Ch.  Past Medical History:  Diagnosis Date   Chronic deep vein thrombosis (DVT) (HCC)    Chronic low back pain    Chronic systolic heart failure (HCC)    a. TTE 2011 with EF 40-45% per notes   Diabetes mellitus without complication (HCC)    Erectile dysfunction    HLD (hyperlipidemia)    Hypertension    Hypogonadism in male    Permanent atrial fibrillation (HCC)    a. s/p DCCV 2011; b. redeveloped Afib in 2016; c. CHADS2VASc at least 5 (CHF, HTN, age x 1, DM, vascular disease); d. on Xarelto   Torn meniscus    right knee    Current Outpatient Medications:    albuterol (VENTOLIN HFA) 108 (90 Base) MCG/ACT inhaler, Inhale 2 puffs into the lungs every 4 (four) hours as needed for wheezing or shortness of breath., Disp: 18 g, Rfl: 3   ammonium lactate (AMLACTIN DAILY) 12 % lotion, Apply 1 Application topically as needed., Disp: 400 g, Rfl: 0   aspirin EC 325 MG tablet, Take 325 mg by mouth daily., Disp: , Rfl:    atorvastatin (LIPITOR) 20 MG tablet, Take 0.5 tablets (10 mg total) by mouth daily., Disp: 45 tablet, Rfl: 1   benazepril (LOTENSIN) 20 MG tablet, Take 1 tablet (20 mg total) by mouth daily. Hold for few days, Disp: 90 tablet, Rfl: 1   fluticasone (FLONASE) 50 MCG/ACT nasal spray, Place 2  sprays into both nostrils daily as needed for allergies or rhinitis., Disp: 48 mL, Rfl: 2   furosemide (LASIX) 20 MG tablet, Take 1 tablet (20 mg total) by mouth daily., Disp: 90 tablet, Rfl: 1   gabapentin (NEURONTIN) 100 MG capsule, Take 1 capsule (100 mg total) by mouth at bedtime for 7 days, THEN 1 capsule (100 mg total) 2 (two) times daily for 7 days, THEN 1 capsule (100 mg total) 3 (three) times daily., Disp: 270 capsule, Rfl: 1   metFORMIN (GLUCOPHAGE-XR) 500 MG 24 hr tablet, Take 2 tablets (1,000 mg total) by mouth 2 (two) times daily as needed., Disp: 360 tablet, Rfl: 1   Metoprolol Tartrate 75 MG TABS, Take 1 tablet (75 mg total) by mouth 2 (two) times daily., Disp: 180 tablet, Rfl: 1   ondansetron (ZOFRAN) 4 MG tablet, Take 1 tablet (4 mg total) by mouth every 6 (six) hours as needed for nausea., Disp: 20 tablet, Rfl: 0   rivaroxaban (XARELTO) 20 MG TABS tablet, Take 1 tablet (20 mg total) by mouth daily., Disp: 60 tablet, Rfl: 0   Semaglutide (RYBELSUS) 3 MG TABS, Take 1 tablet (3 mg total) by mouth daily., Disp: , Rfl:   Social History   Tobacco Use  Smoking Status Never  Smokeless Tobacco Never    Allergies  Allergen Reactions   Penicillins    Objective:  There were no vitals filed for this visit. Body mass index is 38.25 kg/m. Constitutional Well developed. Well nourished.  Vascular Dorsalis pedis pulses palpable bilaterally. Posterior tibial pulses palpable bilaterally. Capillary refill normal to all digits.  No cyanosis or clubbing noted. Pedal hair growth normal.  Neurologic Normal speech. Oriented to person, place, and time. Epicritic sensation to light touch grossly present bilaterally.  Dermatologic Bilateral severe dryness noted with cracking of the skin superficial breakdown noted.  No open wounds or lesion noted  Orthopedic: Normal joint ROM without pain or crepitus bilaterally. No visible deformities. No bony tenderness.   Radiographs: None Assessment:    1. Xerosis cutis    Plan:  Patient was evaluated and treated and all questions answered.  Bilateral severe dryness feet -I explained to the patient the etiology of xerosis and various treatment options were extensively discussed.  I explained to the patient the importance of maintaining moisturization of the skin with application of over-the-counter lotion such as Eucerin or Luciderm.  At this time patient has failed over-the-counter treatment options patient will benefit from ammonium lactate ammonium lactate was sent to the pharmacy have asked him apply twice a day  No follow-ups on file.

## 2023-12-06 NOTE — Progress Notes (Signed)
12/06/2023 Name: Jeff [name]. MRN: [id number] DOB: [birth year]  Chief Complaint  Patient presents with   Medication Assistance   Jeff [name]. is a 73 y.o. year old male who presented for a telephone visit.   They were referred to the pharmacist by their PCP for assistance in managing medication access.   Subjective:  Care Team: Primary Care Provider: Dorcas Carrow, DO ; Next Scheduled Visit: 02/1723  Medication Access/Adherence  Current Pharmacy:  [id number] - [place], [place] - [address] [place] [address] Phone: [phone] Fax: [phone]  [id number] - [place], [place] - [address] [place] [address] [place] [address] Phone: [phone] Fax: [phone]  [facility] [address] [place] - [address] [place] [address] Phone: [phone] Fax: [phone]  -Patient reports affordability concerns with their medications: Yes  -Patient reports access/transportation concerns to their pharmacy: No  -Patient reports adherence concerns with their medications:  Yes    Diabetes: Current medications:   Metformin XR 1000 mg twice daily, Rybelsus 3 mg daily -Patient was also recently prescribed Jardiance 25 mg, but he was unable to pick this up based on his co-pay -He is currently using a Rybelsus 3 mg sample provided by Dr. Henriette Combs office and has approximately 2 weeks left of the medication -Patient endorses tolerating Rybelsus well with no adverse side effects at this time -States he does check home blood glucose but not regularly and is unable to provide any recent values -Patient was seen 1/14, and his A1c was 10.7%, up from 8.0% on 04/19/2023 -Patient mentions he was recently diagnosed with a renal mass after a CT scan-currently under active surveillance by [facility] oncology provider  Objective:  Lab Results  Component Value Date   HGBA1C 10.7  (H) 11/22/2023   Lab Results  Component Value Date   CREATININE 1.19 11/22/2023   BUN 18 11/22/2023   NA 136 11/22/2023   K 4.6 11/22/2023   CL 98 11/22/2023   CO2 23 11/22/2023   Lab Results  Component Value Date   CHOL 96 (L) 08/19/2023   HDL 28 (L) 08/19/2023   LDLCALC 55 08/19/2023   TRIG 56 08/19/2023   CHOLHDL 4.0 10/06/2019   Medications Reviewed Today     Reviewed by Lenna Gilford, RPH (Pharmacist) on 12/06/23 at 1131  Med List Status: <None>   Medication Order Taking? Sig Documenting Provider Last Dose Status Informant  albuterol (VENTOLIN HFA) 108 (90 Base) MCG/ACT inhaler [id number]  Inhale 2 puffs into the lungs every 4 (four) hours as needed for wheezing or shortness of breath. Olevia Perches P, DO  Active Self  aspirin EC 325 MG tablet [id number]  Take 325 mg by mouth daily. [provider]  Active Self  atorvastatin (LIPITOR) 20 MG tablet [id number] Yes Take 0.5 tablets (10 mg total) by mouth daily. Johnson, Megan P, DO Taking Active Self  benazepril (LOTENSIN) 20 MG tablet [id number] Yes Take 1 tablet (20 mg total) by mouth daily. Hold for few days Laural Benes, Megan P, DO Taking Active   empagliflozin (JARDIANCE) 25 MG TABS tablet [id number] No Take 1 tablet (25 mg total) by mouth daily before breakfast.  Patient not taking: Reported on 12/06/2023   Dorcas Carrow, DO Not Taking Active   fluticasone (FLONASE) 50 MCG/ACT nasal spray [id number]  Place 2 sprays into both nostrils daily as needed for allergies or  rhinitis. Johnson, Megan P, DO  Active Self  furosemide (LASIX) 20 MG tablet [id number] Yes Take 1 tablet (20 mg total) by mouth daily. Laural Benes, Megan P, DO Taking Active   gabapentin (NEURONTIN) 100 MG capsule [id number]  Take 1 capsule (100 mg total) by mouth at bedtime for 7 days, THEN 1 capsule (100 mg total) 2 (two) times daily for 7 days, THEN 1 capsule (100 mg total) 3 (three) times daily. Johnson, Megan P, DO  Active   metFORMIN (GLUCOPHAGE-XR) 500 MG  24 hr tablet [id number] Yes Take 2 tablets (1,000 mg total) by mouth 2 (two) times daily as needed. Olevia Perches P, DO Taking Active   Metoprolol Tartrate 75 MG TABS [id number] Yes Take 1 tablet (75 mg total) by mouth 2 (two) times daily. Johnson, Megan P, DO Taking Active   ondansetron (ZOFRAN) 4 MG tablet [id number]  Take 1 tablet (4 mg total) by mouth every 6 (six) hours as needed for nausea. Arnetha Courser, MD  Active   rivaroxaban (XARELTO) 20 MG TABS tablet [id number] Yes Take 1 tablet (20 mg total) by mouth daily. Antonieta Iba, MD Taking Active Self  Semaglutide (RYBELSUS) 3 MG TABS [id number] Yes Take 1 tablet (3 mg total) by mouth daily. Johnson, Megan P, DO Taking Active   Semaglutide (RYBELSUS) 7 MG TABS [id number] No Take 1 tablet (7 mg total) by mouth daily.  Patient not taking: Reported on 12/06/2023   Dorcas Carrow, DO Not Taking Active            Assessment/Plan:   Diabetes: -Currently uncontrolled -Reviewed long term cardiovascular and renal outcomes of uncontrolled blood sugar -Reviewed dietary modifications including decreasing intake of sugar, carbohydrates, and fried foods -Recommend to hold off on SGLT2 therapy until A1c is <10% based on risk of ketoacidosis -Recommend to check glucose and record values at least 3 times per week -Meets financial criteria for Rybelsus patient assistance program through [place].  Submitting application online for 7/14 mg #30 of each dose.  Follow Up Plan: Will check on status of Rybelsus PAP application in the beginning of next week, notify patient, and schedule follow-up appointment.  Lenna Gilford, PharmD, DPLA

## 2023-12-07 DIAGNOSIS — N2889 Other specified disorders of kidney and ureter: Secondary | ICD-10-CM | POA: Diagnosis not present

## 2023-12-12 DIAGNOSIS — N2889 Other specified disorders of kidney and ureter: Secondary | ICD-10-CM | POA: Diagnosis not present

## 2023-12-13 NOTE — Progress Notes (Signed)
   12/13/2023  Patient ID: Jeff [name]., male   DOB: 05/07/1951, 73 y.o.   MRN: [id number]  Contacted the [employer] patient assistance program, and Jeff [name] has been approved to receive Rybelsus  7 mg / 14 mg (a 30-day supply of each at this time).  Patient is currently using a sample of Rybelsus  3 mg provided by primary care provider's office.  Based on current shipping delays with this program, it is unlikely that he will have enough Rybelsus  3 mg to last until shipment arrives.  Contacting PCP office to see if they have additional samples the patient can get; then we will inform him and schedule a follow-up visit in approximately 4 weeks.  Jeff [name], PharmD, DPLA

## 2023-12-14 ENCOUNTER — Encounter: Payer: Self-pay | Admitting: Family Medicine

## 2023-12-14 ENCOUNTER — Other Ambulatory Visit: Payer: Self-pay | Admitting: Family Medicine

## 2023-12-14 ENCOUNTER — Telehealth: Payer: Self-pay

## 2023-12-14 MED ORDER — RYBELSUS 3 MG PO TABS: 3.0000 mg | ORAL_TABLET | Freq: Every day | ORAL | Status: DC

## 2023-12-14 MED ORDER — RYBELSUS 3 MG PO TABS: 3.0000 mg | ORAL_TABLET | Freq: Every day | ORAL | 0 refills | Status: DC

## 2023-12-14 NOTE — Progress Notes (Signed)
   12/14/2023  Patient ID: Jeff [name]., male   DOB: [birth year], 73 y.o.   MRN: [id number]  Outreach attempt to inform patient that Rybelsus  PAP has been approved, and Dr. Ferdie office has set aside another Rybelsus  sample for him based on current delivery delays.  I would also like to schedule a follow-up visit in 4 to 6 weeks to see how he continues to do on this medication.  I was not able to reach him, but a HIPAA compliant voicemail was left with my direct phone number.  If I do not hear back I will try to reach him again in another week or so.  Channing DELENA Mealing, PharmD, DPLA

## 2023-12-14 NOTE — Telephone Encounter (Signed)
 This encounter was created in error - please disregard.

## 2023-12-14 NOTE — Progress Notes (Signed)
 This encounter was created in error - please disregard.

## 2023-12-14 NOTE — Addendum Note (Signed)
 Addended by: Deklan Minar M on: 12/14/2023 12:50 PM   Modules accepted: Orders

## 2023-12-22 ENCOUNTER — Telehealth: Payer: Self-pay

## 2023-12-22 NOTE — Progress Notes (Signed)
   12/22/2023  Patient ID: Jeff [name]., male   DOB: 02/03/1951, 73 y.o.   MRN: [id number]  Patient outreach to notify Jeff [name] he has been approved for Rybelsus PAP and that [facility] has set aside another sample for him due to shipment delays from the assistance program at this time.  We have a telephone follow-up visit scheduled the first week of April to see how he continues to tolerate/respond to the medication.  Lenna Gilford, PharmD, DPLA

## 2024-01-04 NOTE — Telephone Encounter (Signed)
 This encounter was created in error - please disregard.

## 2024-01-11 ENCOUNTER — Telehealth: Payer: Self-pay

## 2024-01-11 NOTE — Telephone Encounter (Signed)
 Rybelsus patient assistance medication received and patient aware to come by office to pick up.

## 2024-01-13 ENCOUNTER — Other Ambulatory Visit: Payer: Self-pay | Admitting: Family Medicine

## 2024-01-16 ENCOUNTER — Other Ambulatory Visit: Payer: Self-pay | Admitting: Family Medicine

## 2024-01-16 NOTE — Telephone Encounter (Signed)
 FYI:   Signed      Rybelsus patient assistance medication received and patient aware to come by office to pick up.         Electronically signed by Ginette Pitman, CMA at 01/11/2024  2:04 PM

## 2024-01-16 NOTE — Telephone Encounter (Signed)
 Requested Prescriptions  Pending Prescriptions Disp Refills   atorvastatin (LIPITOR) 20 MG tablet [Pharmacy Med Name: ATORVASTATIN 20 MG TABLET] 45 tablet 0    Sig: TAKE 1/2 TABLET BY MOUTH DAILY     Cardiovascular:  Antilipid - Statins Failed - 01/16/2024  8:32 AM      Failed - Lipid Panel in normal range within the last 12 months    Cholesterol, Total  Date Value Ref Range Status  08/19/2023 96 (L) 100 - 199 mg/dL Final   Cholesterol Piccolo, Waived  Date Value Ref Range Status  09/07/2018 130 <200 mg/dL Final    Comment:                            Desirable                <200                         Borderline High      200- 239                         High                     >239    LDL Chol Calc (NIH)  Date Value Ref Range Status  08/19/2023 55 0 - 99 mg/dL Final   HDL  Date Value Ref Range Status  08/19/2023 28 (L) >39 mg/dL Final   Triglycerides  Date Value Ref Range Status  08/19/2023 56 0 - 149 mg/dL Final   Triglyceride fasting, serum  Date Value Ref Range Status  02/17/2010 111 mg/dL    Triglycerides Piccolo,Waived  Date Value Ref Range Status  09/07/2018 95 <150 mg/dL Final    Comment:                            Normal                   <150                         Borderline High     150 - 199                         High                200 - 499                         Very High                >499          Passed - Patient is not pregnant      Passed - Valid encounter within last 12 months    Recent Outpatient Visits           1 month ago Type 2 diabetes mellitus with diabetic neuropathy, without long-term current use of insulin (HCC)   [place] [facility] [place], Megan P, DO   3 months ago Facial droop   [place] [facility] [place], Megan P, DO   5 months ago Routine general medical examination at a health care facility   [facility] [place], Megan P, DO  5 months ago COVID    [facility] M[place], Oswaldo Conroy, PA-C   5 months ago [place] and inaccessibility of telehealth   [facility] [place], [place], DO       Future Appointments             In 1 month Laural Benes, Oralia Rud, DO [place] [facility], [facility]

## 2024-01-16 NOTE — Telephone Encounter (Signed)
 Copied from CRM [id number]. Topic: Clinical - Medication Refill >> [name] 16, 2024  8:05 AM Gildardo Pounds wrote: Most Recent Primary Care Visit:  Provider: Olevia Perches P  Department: [id number]  Visit Type: OFFICE VISIT  Date: 11/22/2023  Medication: atorvastatin (LIPITOR) 20 MG tablet  Has the patient contacted their pharmacy? Yes (Agent: If no, request that the patient contact the pharmacy for the refill. If patient does not wish to contact the pharmacy document the reason why and proceed with request.) (Agent: If yes, when and what did the pharmacy advise?)Yes, call your provider  Is this the correct pharmacy for this prescription? Yes If no, delete pharmacy and type the correct one.  This is the patient's preferred pharmacy:  [id number] [place], [place] - [address] [place] [address] Phone: [phone] Fax: [phone]  Has the prescription been filled recently? No  Is the patient out of the medication? Yes  Has the patient been seen for an appointment in the last year OR does the patient have an upcoming appointment? Yes  Can we respond through MyChart? Yes  Agent: Please be advised that Rx refills may take up to 3 business days. We ask that you follow-up with your pharmacy.

## 2024-01-16 NOTE — Telephone Encounter (Unsigned)
 Copied from CRM [id number]. Topic: Clinical - Medication Question >> [name] 16, 2024  8:08 AM Gildardo Pounds wrote: Reason for CRM: Patient is inquiring if he has any RYBELSUS 3 MG TABS that was delivered to the office. Callback number is [phone]

## 2024-01-17 NOTE — Telephone Encounter (Signed)
 Requested Prescriptions  Refused Prescriptions Disp Refills   atorvastatin (LIPITOR) 20 MG tablet 45 tablet 1    Sig: Take 0.5 tablets (10 mg total) by mouth daily.     Cardiovascular:  Antilipid - Statins Failed - 01/17/2024 11:55 AM      Failed - Lipid Panel in normal range within the last 12 months    Cholesterol, Total  Date Value Ref Range Status  08/19/2023 96 (L) 100 - 199 mg/dL Final   Cholesterol Piccolo, Waived  Date Value Ref Range Status  09/07/2018 130 <200 mg/dL Final    Comment:                            Desirable                <200                         Borderline High      200- 239                         High                     >239    LDL Chol Calc (NIH)  Date Value Ref Range Status  08/19/2023 55 0 - 99 mg/dL Final   HDL  Date Value Ref Range Status  08/19/2023 28 (L) >39 mg/dL Final   Triglycerides  Date Value Ref Range Status  08/19/2023 56 0 - 149 mg/dL Final   Triglyceride fasting, serum  Date Value Ref Range Status  02/17/2010 111 mg/dL    Triglycerides Piccolo,Waived  Date Value Ref Range Status  09/07/2018 95 <150 mg/dL Final    Comment:                            Normal                   <150                         Borderline High     150 - 199                         High                200 - 499                         Very High                >499          Passed - Patient is not pregnant      Passed - Valid encounter within last 12 months    Recent Outpatient Visits           1 month ago Type 2 diabetes mellitus with diabetic neuropathy, without long-term current use of insulin (HCC)   [place] [facility] [place], Megan P, DO   3 months ago Facial droop   [place] [facility] [place], Megan P, DO   5 months ago Routine general medical examination at a health care facility   [facility] [place] P, DO   5 months ago  COVID   [facility] [place], Oswaldo Conroy, PA-C   5 months ago [place] and inaccessibility of telehealth   [place] [facility] [place], [place], DO       Future Appointments             In 1 month Laural Benes, Oralia Rud, DO [place] [facility], [facility]

## 2024-02-02 DIAGNOSIS — C641 Malignant neoplasm of right kidney, except renal pelvis: Secondary | ICD-10-CM | POA: Insufficient documentation

## 2024-02-05 LAB — HM DIABETES EYE EXAM

## 2024-02-06 ENCOUNTER — Encounter: Payer: Self-pay | Admitting: Family Medicine

## 2024-02-08 ENCOUNTER — Other Ambulatory Visit: Payer: Self-pay

## 2024-02-08 NOTE — Progress Notes (Unsigned)
   02/08/2024  Patient ID: Jeff [name]., male   DOB: [birth year], 73 y.o.   MRN: [id number]  Subjective/Objective Telephone visit to follow-up on management of diabetes  Diabetes Management -Current  medications:  Rybelsus 7mg  daily, metformin XR 1000mg  BID -Patient receives Rybelsus through [employer] PAP and has approximately 2 tablets left of 7mg  before he will increase to 14mg   dose.  He reports tolerating medication well with no adverse GI effects. -Monitoring FBG regularly and reports readings 140-180 -A1c 10.7% on 1/14- Rybelsus initiated at this time -Patient endorses    Assessment/Plan  Diabetes Management  Follow-up:  4 weeks  Lenna Gilford, PharmD, DPLA

## 2024-02-09 ENCOUNTER — Other Ambulatory Visit: Payer: Self-pay | Admitting: Family Medicine

## 2024-02-10 NOTE — Telephone Encounter (Signed)
 Refill not appropriate, Rx discontinued 11/08/23 due to change in dose. Requested Prescriptions  Pending Prescriptions Disp Refills   metoprolol tartrate (LOPRESSOR) 100 MG tablet [Pharmacy Med Name: METOPROLOL TARTRATE 100 MG TAB] 90 tablet 1    Sig: TAKE 0.5 TABLETS BY MOUTH 2 TIMES DAILY.     Cardiovascular:  Beta Blockers Failed - 02/10/2024 11:55 AM      Failed - Last BP in normal range    BP Readings from Last 1 Encounters:  12/02/23 (!) 140/78         Failed - Valid encounter within last 6 months    Recent Outpatient Visits   None     Future Appointments             In 1 week Laural Benes, Megan P, DO [place] [facility], [facility]            Passed - Last Heart Rate in normal range    Pulse Readings from Last 1 Encounters:  12/02/23 72

## 2024-02-13 ENCOUNTER — Ambulatory Visit

## 2024-02-16 ENCOUNTER — Other Ambulatory Visit: Payer: Self-pay | Admitting: Family Medicine

## 2024-02-16 NOTE — Telephone Encounter (Signed)
 Unable to refill per protocol, Rx discontinued on 11/08/23.  Requested Prescriptions  Pending Prescriptions Disp Refills   doxazosin (CARDURA) 2 MG tablet [Pharmacy Med Name: DOXAZOSIN MESYLATE 2 MG TAB] 180 tablet 1    Sig: TAKE 1 TABLET BY MOUTH 2 TIMES DAILY.     Cardiovascular:  Alpha Blockers Failed - 02/16/2024  2:33 PM      Failed - Last BP in normal range    BP Readings from Last 1 Encounters:  12/02/23 (!) 140/78         Failed - Valid encounter within last 6 months    Recent Outpatient Visits   None     Future Appointments             In 1 week Dorcas Carrow, DO [place] [facility] [place], [facility]

## 2024-02-23 ENCOUNTER — Ambulatory Visit: Payer: Self-pay | Admitting: Family Medicine

## 2024-03-07 ENCOUNTER — Other Ambulatory Visit: Payer: Self-pay

## 2024-03-07 MED ORDER — TRUE METRIX METER DEVI: 0 refills | Status: AC

## 2024-03-07 MED ORDER — LANCETS MISC: 12 refills | Status: AC

## 2024-03-07 MED ORDER — TRUE METRIX METER DEVI: Status: DC

## 2024-03-07 MED ORDER — GNP TRUE METRIX GLUCOSE STRIPS VI STRP: ORAL_STRIP | 12 refills | Status: AC

## 2024-03-07 NOTE — Progress Notes (Signed)
   03/07/2024  Patient ID: Jeff [name]., male   DOB: 1951/01/04, 73 y.o.   MRN: [id number]  Refaxed reorder form for Rybelsus  14mg  to [facility] for Dr. Lincoln Renshaw to sign/date and fax into [employer] PAP.  Linn Rich, PharmD, DPLA

## 2024-03-07 NOTE — Progress Notes (Signed)
   03/07/2024  Patient ID: Altamese Ates., male   DOB: 1951/10/04, 73 y.o.   MRN: [id number]  Subjective/Objective Telephone visit to follow-up on management of diabetes   Diabetes Management -Current  medications:  Rybelsus  14mg  daily, metformin  XR 1000mg  BID -Patient receives Rybelsus  through [employer] PAP and has approximately 3 weeks of Rybelsus  14mg  remaining.  He reports tolerating medication well with no adverse GI effects. -Reorder form for 4 boxes of Rybelsus  14mg  was faxed to PCP office 4/3 to sign and fax into [employer] -Monitoring FBG regularly and reports readings 140-160- these are not fasting and unclear how soon post-prandial -Patient is requesting refill of test strips, but states insurance has changed since these were last filled; so I will contact plan to verify what product(s) are preferred -A1c 10.7% on 1/14 (up from 8.0%)- Rybelsus  initiated at this time -Patient endorses recent dietary modifications, including decreased sugar intake; but he also has decreased physical activity currently- recovering from surgery to remove renal mass   Assessment/Plan   Diabetes Management -Continue current medication regimen at this time and regular monitoring of BG -Contacted [employer] P[employer] to check on processing status for refill on Rybelsus  14mg , and the company states they have not yet received this.  Coordinating with the office to see if I need to refax to them for Dr. Lincoln Renshaw to sign and fax into Nov[employer] PAP.  [employer], and they do not cover [employer] products; but [employer] supplies are covered, and expected copays are:  $1.21 for glucometer, $2.40 for 90 day supply of test strips, and $1.21 for 90 day supply of lancets.  [facility] standing order. -Sees Dr. Lincoln Renshaw again 5/23 and due for A1c   Follow-up:  2 months   Linn Rich, PharmD, DPLA

## 2024-03-22 ENCOUNTER — Telehealth: Payer: Self-pay

## 2024-03-22 NOTE — Telephone Encounter (Signed)
 Copied from CRM [id number]. Topic: Clinical - Medication Question >> Mar 22, 2024  3:17 PM Lotus Round B wrote: Reason for CRM: pt called in to see if he can talk to someone at the clinic about his  Semaglutide  (RYBELSUS ) 14 MG TABS medication that he picks up at the clinic . i called [facility] to see if they have it and they said no for me to put a CRM in to Clinical . i went back to patient and he said he would like to speak with someone at the clinic he doesnt want me to put in a note  . i called the clinic back for him to speak with someone that he doesn't agree with a CRM they said they don't have anyone for him to talk to just put in a CRM. Can someone please call this patient

## 2024-03-22 NOTE — Telephone Encounter (Signed)
 Gave [employer] a call to follow up on pt's refill request on Rybelsus  14 mg per [employer] they have mail out on May 7th it will take 10-14 days for provider's office to received, spoke with Mr. Charlann Confer explain [employer] have mail out once provider office received it they will call pt to let him know.

## 2024-03-22 NOTE — Telephone Encounter (Signed)
 Patient spoke with patient assistance team member about his pending Novo delivery. He does however state that the same concern that I have is that he only has 2 pills left and could run out before delivery is received. Advised I would forward to pharmacy team to see if we had an option for assistance with a voucher and if not we could provide samples but with the idea he would need to take a few since we only have the 3 mg in stock for samples.   Forwarding to Pharm D and PCP for assistance.

## 2024-03-23 NOTE — Telephone Encounter (Signed)
 We've got some 7mg  in the fridge for "Jeff [name]" who is not going to take it- OK to give him one of those boxes (30 days, not 90)

## 2024-03-27 NOTE — Telephone Encounter (Signed)
 Patient aware he can pick up 1 sample box.

## 2024-03-30 ENCOUNTER — Ambulatory Visit: Admitting: Family Medicine

## 2024-03-30 ENCOUNTER — Encounter: Payer: Self-pay | Admitting: Family Medicine

## 2024-03-30 VITALS — BP 108/63 | HR 77 | Temp 97.9°F | Resp 15 | Ht 70.98 in | Wt 275.6 lb

## 2024-03-30 DIAGNOSIS — E782 Mixed hyperlipidemia: Secondary | ICD-10-CM | POA: Diagnosis not present

## 2024-03-30 DIAGNOSIS — E1140 Type 2 diabetes mellitus with diabetic neuropathy, unspecified: Secondary | ICD-10-CM | POA: Diagnosis not present

## 2024-03-30 DIAGNOSIS — Z7984 Long term (current) use of oral hypoglycemic drugs: Secondary | ICD-10-CM

## 2024-03-30 DIAGNOSIS — I129 Hypertensive chronic kidney disease with stage 1 through stage 4 chronic kidney disease, or unspecified chronic kidney disease: Secondary | ICD-10-CM | POA: Diagnosis not present

## 2024-03-30 LAB — BAYER DCA HB A1C WAIVED: HB A1C (BAYER DCA - WAIVED): 9.9 % — ABNORMAL HIGH (ref 4.8–5.6)

## 2024-03-30 MED ORDER — ATORVASTATIN CALCIUM 20 MG PO TABS: 10.0000 mg | ORAL_TABLET | Freq: Every day | ORAL | 1 refills | Status: DC

## 2024-03-30 MED ORDER — METFORMIN HCL ER 500 MG PO TB24: 1000.0000 mg | ORAL_TABLET | Freq: Two times a day (BID) | ORAL | 1 refills | Status: DC | PRN

## 2024-03-30 MED ORDER — BENAZEPRIL HCL 20 MG PO TABS: 20.0000 mg | ORAL_TABLET | Freq: Every day | ORAL | 1 refills | Status: DC

## 2024-03-30 MED ORDER — FUROSEMIDE 20 MG PO TABS: 20.0000 mg | ORAL_TABLET | Freq: Every day | ORAL | 1 refills | Status: DC

## 2024-03-30 MED ORDER — METOPROLOL TARTRATE 75 MG PO TABS: 75.0000 mg | ORAL_TABLET | Freq: Two times a day (BID) | ORAL | 1 refills | Status: DC

## 2024-03-30 MED ORDER — GABAPENTIN 100 MG PO CAPS: 100.0000 mg | ORAL_CAPSULE | Freq: Three times a day (TID) | ORAL | 1 refills | Status: DC

## 2024-03-30 NOTE — Assessment & Plan Note (Signed)
 Improved with A1c of 9.9. Not on 14mg  of ryblesus due to supply issues- will get him back on 14mg  and recheck in 3 months. Call with any concerns.

## 2024-03-30 NOTE — Assessment & Plan Note (Signed)
 Under good control on current regimen. Continue current regimen. Continue to monitor. Call with any concerns. Refills given. Labs drawn today.

## 2024-03-30 NOTE — Progress Notes (Signed)
 BP 108/63 (BP Location: Left Arm, Patient Position: Sitting, Cuff Size: Large)   Pulse 77   Temp 97.9 F (36.6 C) (Oral)   Resp 15   Ht 5' 10.98" (1.803 m)   Wt 275 lb 9.6 oz (125 kg)   SpO2 97%   BMI 38.46 kg/m    Subjective:    Patient ID: Jeff [name]., male    DOB: [birth year], 73 y.o.   MRN: [id number]  HPI: Jeff [name]. is a 73 y.o. male  Chief Complaint  Patient presents with   Diabetes    Still waiting on Rybelsus     DIABETES- was out of his rybelsus  for a week and then was taking 9mg  due to waiting on it from the company Hypoglycemic episodes:no Polydipsia/polyuria: no Visual disturbance: no Chest pain: no Paresthesias: no Glucose Monitoring: occasionally Taking Insulin ?: no Blood Pressure Monitoring: not checking Retinal Examination: Up to Date Foot Exam: Up to Date Diabetic Education: Completed Pneumovax: Up to Date Influenza: Up to Date Aspirin : no  HYPERTENSION / HYPERLIPIDEMIA Satisfied with current treatment? yes Duration of hypertension: chronic BP monitoring frequency: not checking BP medication side effects: no Past BP meds: metoprolol , lasix , benazepril  Duration of hyperlipidemia: chronic Cholesterol medication side effects: no Cholesterol supplements: none Past cholesterol medications: atorvastatin  Medication compliance: excellent compliance Aspirin : no Recent stressors: no Recurrent headaches: no Visual changes: no Palpitations: no Dyspnea: no Chest pain: no Lower extremity edema: no Dizzy/lightheaded: no  Relevant past medical, surgical, family and social history reviewed and updated as indicated. Interim medical history since our last visit reviewed. Allergies and medications reviewed and updated.  Review of Systems  Constitutional: Negative.   Respiratory: Negative.    Cardiovascular: Negative.   Musculoskeletal: Negative.   Neurological: Negative.   Psychiatric/Behavioral: Negative.      Per HPI unless  specifically indicated above     Objective:     BP 108/63 (BP Location: Left Arm, Patient Position: Sitting, Cuff Size: Large)   Pulse 77   Temp 97.9 F (36.6 C) (Oral)   Resp 15   Ht 5' 10.98" (1.803 m)   Wt 275 lb 9.6 oz (125 kg)   SpO2 97%   BMI 38.46 kg/m   Wt Readings from Last 3 Encounters:  03/30/24 275 lb 9.6 oz (125 kg)  12/06/23 274 lb 4 oz (124.4 kg)  12/02/23 274 lb 4 oz (124.4 kg)    Physical Exam Vitals and nursing note reviewed.  Constitutional:      General: He is not in acute distress.    Appearance: Normal appearance. He is obese. He is not ill-appearing, toxic-appearing or diaphoretic.  HENT:     Head: Normocephalic and atraumatic.     Right Ear: External ear normal.     Left Ear: External ear normal.     Nose: Nose normal.     Mouth/Throat:     Mouth: Mucous membranes are moist.     Pharynx: Oropharynx is clear.  Eyes:     General: No scleral icterus.       Right eye: No discharge.        Left eye: No discharge.     Extraocular Movements: Extraocular movements intact.     Conjunctiva/sclera: Conjunctivae normal.     Pupils: Pupils are equal, round, and reactive to light.  Cardiovascular:     Rate and Rhythm: Normal rate and regular rhythm.     Pulses: Normal pulses.     Heart sounds: Normal heart sounds.  No murmur heard.    No friction rub. No gallop.  Pulmonary:     Effort: Pulmonary effort is normal. No respiratory distress.     Breath sounds: Normal breath sounds. No stridor. No wheezing, rhonchi or rales.  Chest:     Chest wall: No tenderness.  Musculoskeletal:        General: Normal range of motion.     Cervical back: Normal range of motion and neck supple.  Skin:    General: Skin is warm and dry.     Capillary Refill: Capillary refill takes less than 2 seconds.     Coloration: Skin is not jaundiced or pale.     Findings: No bruising, erythema, lesion or rash.  Neurological:     General: No focal deficit present.     Mental Status:  He is alert and oriented to person, place, and time. Mental status is at baseline.  Psychiatric:        Mood and Affect: Mood normal.        Behavior: Behavior normal.        Thought Content: Thought content normal.        Judgment: Judgment normal.     Results for orders placed or performed in visit on 02/06/24  HM DIABETES EYE EXAM   Collection Time: 02/05/24 10:08 AM  Result Value Ref Range   HM Diabetic Eye Exam Retinopathy (A) No Retinopathy      Assessment & Plan:   Problem List Items Addressed This Visit       Endocrine   Type 2 diabetes mellitus with diabetic neuropathy, unspecified (HCC) - Primary   Improved with A1c of 9.9. Not on 14mg  of ryblesus due to supply issues- will get him back on 14mg  and recheck in 3 months. Call with any concerns.       Relevant Medications   atorvastatin  (LIPITOR) 20 MG tablet   benazepril  (LOTENSIN ) 20 MG tablet   metFORMIN  (GLUCOPHAGE -XR) 500 MG 24 hr tablet   Other Relevant Orders   Bayer DCA Hb A1c Waived   CBC with Differential/Platelet   Comprehensive metabolic panel with GFR   Lipid Panel w/o Chol/HDL Ratio     Genitourinary   Hypertensive renal disease   Under good control on current regimen. Continue current regimen. Continue to monitor. Call with any concerns. Refills given. Labs drawn today.         Other   Mixed hyperlipidemia   Under good control on current regimen. Continue current regimen. Continue to monitor. Call with any concerns. Refills given. Labs drawn today.      Relevant Medications   atorvastatin  (LIPITOR) 20 MG tablet   benazepril  (LOTENSIN ) 20 MG tablet   furosemide  (LASIX ) 20 MG tablet   Metoprolol  Tartrate 75 MG TABS     Follow up plan: Return in about 3 months (around 06/30/2024) for with me and ASAP with Maureen Sour for AWV.

## 2024-03-31 LAB — CBC WITH DIFFERENTIAL/PLATELET
Basophils Absolute: 0 10*3/uL (ref 0.0–0.2)
Basos: 1 %
EOS (ABSOLUTE): 0.1 10*3/uL (ref 0.0–0.4)
Eos: 1 %
Hematocrit: 49 % (ref 37.5–51.0)
Hemoglobin: 15.9 g/dL (ref 13.0–17.7)
Immature Grans (Abs): 0.1 10*3/uL (ref 0.0–0.1)
Immature Granulocytes: 1 %
Lymphocytes Absolute: 1.2 10*3/uL (ref 0.7–3.1)
Lymphs: 18 %
MCH: 28.3 pg (ref 26.6–33.0)
MCHC: 32.4 g/dL (ref 31.5–35.7)
MCV: 87 fL (ref 79–97)
Monocytes Absolute: 0.6 10*3/uL (ref 0.1–0.9)
Monocytes: 8 %
Neutrophils Absolute: 5 10*3/uL (ref 1.4–7.0)
Neutrophils: 71 %
Platelets: 175 10*3/uL (ref 150–450)
RBC: 5.62 x10E6/uL (ref 4.14–5.80)
RDW: 14.3 % (ref 11.6–15.4)
WBC: 6.9 10*3/uL (ref 3.4–10.8)

## 2024-03-31 LAB — COMPREHENSIVE METABOLIC PANEL WITH GFR
ALT: 19 IU/L (ref 0–44)
AST: 19 IU/L (ref 0–40)
Albumin: 4.6 g/dL (ref 3.8–4.8)
Alkaline Phosphatase: 75 IU/L (ref 44–121)
BUN/Creatinine Ratio: 19 (ref 10–24)
BUN: 30 mg/dL — ABNORMAL HIGH (ref 8–27)
Bilirubin Total: 0.7 mg/dL (ref 0.0–1.2)
CO2: 22 mmol/L (ref 20–29)
Calcium: 9.4 mg/dL (ref 8.6–10.2)
Chloride: 97 mmol/L (ref 96–106)
Creatinine, Ser: 1.55 mg/dL — ABNORMAL HIGH (ref 0.76–1.27)
Globulin, Total: 2 g/dL (ref 1.5–4.5)
Glucose: 208 mg/dL — ABNORMAL HIGH (ref 70–99)
Potassium: 5 mmol/L (ref 3.5–5.2)
Sodium: 136 mmol/L (ref 134–144)
Total Protein: 6.6 g/dL (ref 6.0–8.5)
eGFR: 47 mL/min/{1.73_m2} — ABNORMAL LOW (ref >59)

## 2024-03-31 LAB — LIPID PANEL W/O CHOL/HDL RATIO
Cholesterol, Total: 135 mg/dL (ref 100–199)
HDL: 34 mg/dL — ABNORMAL LOW (ref >39)
LDL Chol Calc (NIH): 79 mg/dL (ref 0–99)
Triglycerides: 123 mg/dL (ref 0–149)
VLDL Cholesterol Cal: 22 mg/dL (ref 5–40)

## 2024-04-02 ENCOUNTER — Encounter: Payer: Self-pay | Admitting: Emergency Medicine

## 2024-04-03 ENCOUNTER — Ambulatory Visit: Payer: Self-pay | Admitting: Family Medicine

## 2024-04-03 ENCOUNTER — Ambulatory Visit (INDEPENDENT_AMBULATORY_CARE_PROVIDER_SITE_OTHER): Admitting: Emergency Medicine

## 2024-04-03 VITALS — Ht 71.0 in | Wt 260.0 lb

## 2024-04-03 DIAGNOSIS — Z0000 Encounter for general adult medical examination without abnormal findings: Secondary | ICD-10-CM | POA: Diagnosis not present

## 2024-04-03 NOTE — Progress Notes (Signed)
 Subjective:   Jeff Bartus. is a 73 y.o. who presents for a Medicare Wellness preventive visit.  As a reminder, Annual Wellness Visits don't include a physical exam, and some assessments may be limited, especially if this visit is performed virtually. We may recommend an in-person follow-up visit with your provider if needed.  Visit Complete: Virtual I connected with  Jeff [name]. on 04/03/24 by a audio enabled telemedicine application and verified that I am speaking with the correct person using two identifiers.  Patient Location: Home  Provider Location: Office/Clinic  I discussed the limitations of evaluation and management by telemedicine. The patient expressed understanding and agreed to proceed.  Vital Signs: Because this visit was a virtual/telehealth visit, some criteria may be missing or patient reported. Any vitals not documented were not able to be obtained and vitals that have been documented are patient reported.  VideoDeclined- This patient declined Librarian, academic. Therefore the visit was completed with audio only.  Persons Participating in Visit: Patient.  AWV Questionnaire: No: Patient Medicare AWV questionnaire was not completed prior to this visit.  Cardiac Risk Factors include: advanced age (>42men, >92 women);male gender;dyslipidemia;hypertension;diabetes mellitus;obesity (BMI >30kg/m2)     Objective:     Today's Vitals   04/03/24 1033  Weight: 260 lb (117.9 kg)  Height: 5\' 11"  (1.803 m)   Body mass index is 36.26 kg/m.     04/03/2024   10:51 AM 11/06/2023   10:29 PM 01/31/2023    8:21 AM 01/21/2022    8:22 AM 08/20/2021    1:14 PM 08/19/2021    4:59 PM 04/06/2021    4:10 PM  Advanced Directives  Does Patient Have a Medical Advance Directive? Yes No Yes No;Yes Yes Yes No  Type of Estate agent of [place];Living will  Healthcare Power of [place];Living will Healthcare Power of [employer] Power of Attorney    Does patient want to make changes to medical advance directive? No - Patient declined  No - Patient declined  Yes (Inpatient - patient requests chaplain consult to change a medical advance directive)    Copy of Healthcare Power of Attorney in Chart? Yes - validated most recent copy scanned in chart (See row information)  Yes - validated most recent copy scanned in chart (See row information) Yes - validated most recent copy scanned in chart (See row information)     Would patient like information on creating a medical advance directive?       No - Patient declined    Current Medications (verified) Outpatient Encounter Medications as of 04/03/2024  Medication Sig   albuterol  (VENTOLIN  HFA) 108 (90 Base) MCG/ACT inhaler Inhale 2 puffs into the lungs every 4 (four) hours as needed for wheezing or shortness of breath.   ammonium lactate  (AMLACTIN DAILY) 12 % lotion Apply 1 Application topically as needed.   aspirin  EC 325 MG tablet Take 325 mg by mouth daily.   atorvastatin  (LIPITOR) 20 MG tablet Take 0.5 tablets (10 mg total) by mouth daily.   benazepril  (LOTENSIN ) 20 MG tablet Take 1 tablet (20 mg total) by mouth daily. Hold for few days   Blood Glucose Monitoring Suppl (TRUE METRIX METER) DEVI Check blood glucose up to three times daily.  Insurance prefers True Metrix supplies.  E11.65 Type 2 Diabetes.   fluticasone  (FLONASE ) 50 MCG/ACT nasal spray Place 2 sprays into both nostrils daily as needed for allergies or rhinitis.   furosemide  (LASIX ) 20 MG  tablet Take 1 tablet (20 mg total) by mouth daily.   gabapentin  (NEURONTIN ) 100 MG capsule Take 1 capsule (100 mg total) by mouth 3 (three) times daily.   glucose blood (GNP TRUE METRIX GLUCOSE STRIPS) test strip Use to check blood glucose up to 3 times daily.  Insurance covers [employer] supplies.  E11.65 Type 2 Diabetes   Lancets MISC Use to check blood glucose up to 3 times daily.  Insurance covers [employer]  supplies.  E11.65 Type 2 Diabetes   metFORMIN  (GLUCOPHAGE -XR) 500 MG 24 hr tablet Take 2 tablets (1,000 mg total) by mouth 2 (two) times daily as needed.   metoprolol  tartrate (LOPRESSOR ) 100 MG tablet Take 50 mg by mouth 2 (two) times daily.   ondansetron  (ZOFRAN ) 4 MG tablet Take 1 tablet (4 mg total) by mouth every 6 (six) hours as needed for nausea.   rivaroxaban  (XARELTO ) 20 MG TABS tablet Take 1 tablet (20 mg total) by mouth daily.   Semaglutide  (RYBELSUS ) 14 MG TABS Take 14 mg by mouth daily.   Metoprolol  Tartrate 75 MG TABS Take 1 tablet (75 mg total) by mouth 2 (two) times daily. (Patient not taking: Reported on 04/03/2024)   No facility-administered encounter medications on file as of 04/03/2024.    Allergies (verified) Penicillins   History: Past Medical History:  Diagnosis Date   Chronic deep vein thrombosis (DVT) (HCC)    Chronic low back pain    Chronic systolic heart failure (HCC)    a. TTE 2011 with EF 40-45% per notes   Diabetes mellitus without complication [facility])    Erectile dysfunction    HLD (hyperlipidemia)    Hypertension    Hypogonadism in male    Permanent atrial fibrillation (HCC)    a. s/p DCCV 2011; b. redeveloped Afib in 2016; c. CHADS2VASc at least 5 (CHF, HTN, age x 1, DM, vascular disease); d. on Xarelto    Torn meniscus    right knee   Past Surgical History:  Procedure Laterality Date   COLONOSCOPY WITH PROPOFOL  N/A 05/28/2016   Procedure: COLONOSCOPY WITH PROPOFOL ;  Surgeon: Marnee Sink, MD;  Location: [facility];  Service: Endoscopy;  Laterality: N/A;  diabetic - oral meds   DVT, leg Left    INSERTION OF VENA CAVA FILTER  2014   KNEE ARTHROSCOPY WITH MEDIAL MENISECTOMY Right 05/31/2019   Procedure: KNEE ARTHROSCOPY WITH partial  MEDIAL MENISECTOMY;  Surgeon: Lorri Rota, MD;  Location: [facility];  Service: Orthopedics;  Laterality: Right;   LOWER EXTREMITY VENOGRAPHY Left 08/21/2021   Procedure: LOWER EXTREMITY VENOGRAPHY;   Surgeon: Jackquelyn Mass, MD;  Location: [facility] INVASIVE CV LAB;  Service: Cardiovascular;  Laterality: Left;  Possible IVC Filter Insertion   PERIPHERAL VASCULAR THROMBECTOMY Left 12/01/2017   Procedure: PERIPHERAL VASCULAR THROMBECTOMY;  Surgeon: Jackquelyn Mass, MD;  Location: [facility] INVASIVE CV LAB;  Service: Cardiovascular;  Laterality: Left;   Family History  Problem Relation Age of Onset   Lymphoma Mother    Heart failure Father    Diabetes Father    Atrial fibrillation Brother    Diabetes Mellitus II Other    Heart attack Neg Hx    Hypertension Neg Hx    Cancer Neg Hx    COPD Neg Hx    Stroke Neg Hx    Prostate cancer Neg Hx    Kidney cancer Neg Hx    Bladder Cancer Neg Hx    Social History   Socioeconomic History   Marital status:  Domestic Partner    Spouse name: Not on file   Number of children: Not on file   Years of education: Not on file   Highest education level: Some college, no degree  Occupational History   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 [employer] club   Works at [employer]    04/03/24 retired, living with girlfriend of 25 years   Social Drivers of Corporate investment banker Strain: Low Risk  (04/03/2024)   Overall Financial Resource Strain (CARDIA)    Difficulty of Paying Living Expenses: Not hard at all  Food Insecurity: No Food Insecurity (04/03/2024)   Hunger Vital Sign    Worried About Running Out of Food in the Last Year: Never true    Ran Out of Food in the Last Year: Never true  Transportation Needs: No Transportation Needs (04/03/2024)    PRAPARE - Administrator, Civil Service (Medical): No    Lack of Transportation (Non-Medical): No  Physical Activity: Sufficiently Active (04/03/2024)   Exercise Vital Sign    Days of Exercise per Week: 3 days    Minutes of Exercise per Session: 60 min  Stress: No Stress Concern Present (04/03/2024)   [employer] of Occupational Health - Occupational Stress Questionnaire    Feeling of Stress : Not at all  Social Connections: Socially Integrated (04/03/2024)   Social Connection and Isolation Panel [NHANES]    Frequency of Communication with Friends and Family: More than three times a week    Frequency of Social Gatherings with Friends and Family: More than three times a week    Attends Religious Services: More than 4 times per year    Active Member of [employer] or Organizations: Yes    Attends Engineer, structural: More than 4 times per year    Marital Status: Living with partner    Tobacco Counseling Counseling given: No    Clinical Intake:  Pre-visit preparation completed: Yes  Pain : No/denies pain     BMI - recorded: 36.26 Nutritional Status: BMI > 30  Obese Nutritional Risks: None Diabetes: Yes CBG done?: No (FBS 128 per patient) Did pt. bring in CBG monitor from home?: No  Lab Results  Component Value Date   HGBA1C 9.9 (H) 03/30/2024   HGBA1C 10.7 (H) 11/22/2023   HGBA1C 8.2 (H) 08/19/2023     How often do you need to have someone help you when you read instructions, pamphlets, or other written materials from your doctor or pharmacy?: 1 - Never  Interpreter Needed?: No  Information entered by :: Jaunita Messier, CMA   Activities of Daily Living     04/03/2024   10:36 AM 08/02/2023    8:23 AM  In your present state of health, do you have any difficulty performing the following activities:  Hearing? 0 0  Vision? 0 0  Difficulty concentrating or making decisions? 0 0  Walking or climbing stairs? 0 0  Dressing or bathing? 0 0  Doing  errands, shopping?  0 0  Preparing Food and eating ? N   Using the Toilet? N   In the past six months, have you accidently leaked urine? N   Do you have problems with loss of bowel control? N   Managing your Medications? N   Managing your Finances? N   Housekeeping or managing your Housekeeping? N     Patient Care Team: Solomon Dupre, DO as PCP - General (Family Medicine) Devorah Fonder, MD as Consulting Physician (Cardiology) Rogers Clayman, MD as Referring Physician (Otolaryngology) Bernard, Jenna, OD as Referring Physician (Optometry) Velma Ghazi, DPM as Consulting Physician (Podiatry) Lincoln Renshaw Lyndia Sans, MD (Radiology)  Indicate any recent Medical Services you may have received from other than [facility] providers in the past year (date may be approximate).     Assessment:    This is a routine wellness examination for Jung.  Hearing/Vision screen Hearing Screening - Comments:: Denies hearing loss Vision Screening - Comments:: Gets DM eye exams, Dr. Johnie Nailer @ [facility], [place]   Goals Addressed               This Visit's Progress     Patient Stated (pt-stated)        Lose 40 lbs       Depression Screen     04/03/2024   10:49 AM 03/30/2024    2:24 PM 11/22/2023   12:20 PM 08/19/2023    8:57 AM 08/02/2023    8:23 AM 08/01/2023    8:28 AM 04/19/2023   10:32 AM  PHQ 2/9 Scores  PHQ - 2 Score 0 0 0 0 0 0 0  PHQ- 9 Score 0 0 0 0   0    Fall Risk     04/03/2024   10:52 AM 03/30/2024    2:24 PM 11/22/2023   12:20 PM 08/19/2023    8:58 AM 08/02/2023    8:23 AM  Fall Risk   Falls in the past year? 0 0 0 0 0  Number falls in past yr: 0 0 0 0 0  Injury with Fall? 0 0  0 0  Risk for fall due to : No Fall Risks No Fall Risks No Fall Risks No Fall Risks No Fall Risks  Follow up Falls evaluation completed Falls evaluation completed Falls evaluation completed Falls evaluation completed Falls prevention discussed    MEDICARE RISK AT HOME:  Medicare  Risk at Home Any stairs in or around the home?: Yes If so, are there any without handrails?: No Home free of loose throw rugs in walkways, pet beds, electrical cords, etc?: Yes Adequate lighting in your home to reduce risk of falls?: Yes Life alert?: No Use of a cane, walker or w/c?: No Grab bars in the bathroom?: Yes Shower chair or bench in shower?: Yes Elevated toilet seat or a handicapped toilet?: Yes  TIMED UP AND GO:  Was the test performed?  No  Cognitive Function: 6CIT completed        04/03/2024   10:53 AM 01/31/2023    8:26 AM 11/24/2020    9:05 AM  6CIT Screen  What Year? 0 points 0 points 0 points  What month? 0 points 0 points 0 points  What time? 0 points 0 points 0 points  Count back from 20 0 points 0 points 0 points  Months in reverse 0 points 0 points 0 points  Repeat phrase 0 points 0 points 0 points  Total Score 0 points 0 points  0 points    Immunizations Immunization History  Administered Date(s) Administered   Fluad Quad(high Dose 65+) 08/31/2021, 08/16/2022   Fluad Trivalent(High Dose 65+) 08/19/2023   Influenza, High Dose Seasonal PF 10/04/2016, 09/07/2017, 09/13/2018, 08/20/2019   Influenza,inj,Quad PF,6+ Mos 09/01/2015   Moderna Sars-Covid-2 Vaccination 12/25/2019, 01/22/2020, 08/08/2020, 09/04/2020, 06/08/2021   Pneumococcal Conjugate-13 10/04/2016   Pneumococcal Polysaccharide-23 07/30/2009, 02/23/2018   Td 03/13/2020   Zoster, Live 03/18/2014    Screening Tests Health Maintenance  Topic Date Due   Zoster Vaccines- Shingrix (1 of 2) 02[birth year]04/1970   INFLUENZA VACCINE  06/08/2024   Diabetic kidney evaluation - Urine ACR  08/18/2024   FOOT EXAM  08/18/2024   HEMOGLOBIN A1C  09/30/2024   OPHTHALMOLOGY EXAM  02/04/2025   Diabetic kidney evaluation - eGFR measurement  03/30/2025   Medicare Annual Wellness (AWV)  04/03/2025   Colonoscopy  05/28/2026   DTaP/Tdap/Td (2 - Tdap) 03/13/2030   Pneumonia Vaccine 4+ Years old  Completed   Hepatitis  C Screening  Completed   HPV VACCINES  Aged Out   Meningococcal B Vaccine  Aged Out   COVID-19 Vaccine  Discontinued    Health Maintenance  Health Maintenance Due  Topic Date Due   Zoster Vaccines- Shingrix (1 of 2) 02/04/1970   Health Maintenance Items Addressed: See Nurse Notes  Additional Screening:  Vision Screening: Recommended annual ophthalmology exams for early detection of glaucoma and other disorders of the eye.  Dental Screening: Recommended annual dental exams for proper oral hygiene  Community Resource Referral / Chronic Care Management: CRR required this visit?  No   CCM required this visit?  No   Plan:    I have personally reviewed and noted the following in the patient's chart:   Medical and social history Use of alcohol, tobacco or illicit drugs  Current medications and supplements including opioid prescriptions. Patient is not currently taking opioid prescriptions. Functional ability and status Nutritional status Physical activity Advanced directives List of other physicians Hospitalizations, surgeries, and ER visits in previous 12 months Vitals Screenings to include cognitive, depression, and falls Referrals and appointments  In addition, I have reviewed and discussed with patient certain preventive protocols, quality metrics, and best practice recommendations. A written personalized care plan for preventive services as well as general preventive health recommendations were provided to patient.   Jaunita Messier, CMA   04/03/2024   After Visit Summary: (MyChart) Due to this being a telephonic visit, the after visit summary with patients personalized plan was offered to patient via MyChart   Notes: Please refer to Routing Comments.

## 2024-04-03 NOTE — Patient Instructions (Addendum)
 Jeff [name] , Thank you for taking time out of your busy schedule to complete your Annual Wellness Visit with me. I enjoyed our conversation and look forward to speaking with you again next year. I, as well as your care team,  appreciate your ongoing commitment to your health goals. Please review the following plan we discussed and let me know if I can assist you in the future. Your Game plan/ To Do List    Referrals: None  Follow up Visits: Next Medicare AWV with our clinical staff: 04/09/25 @ 10:00 am (phone visit)   Have you seen your provider in the last 6 months (3 months if uncontrolled diabetes)? Yes Next Office Visit with your provider: 07/02/24 @ 10:40am with Dr. Lincoln Renshaw  Clinician Recommendations:  Aim for 30 minutes of exercise or brisk walking, 6-8 glasses of water , and 5 servings of fruits and vegetables each day.       This is a list of the screening recommended for you and due dates:  Health Maintenance  Topic Date Due   Zoster (Shingles) Vaccine (1 of 2) 02/04/1970   Flu Shot  06/08/2024   Yearly kidney health urinalysis for diabetes  08/18/2024   Complete foot exam   08/18/2024   Hemoglobin A1C  09/30/2024   Eye exam for diabetics  02/04/2025   Yearly kidney function blood test for diabetes  03/30/2025   Medicare Annual Wellness Visit  04/03/2025   Colon Cancer Screening  05/28/2026   DTaP/Tdap/Td vaccine (2 - Tdap) 03/13/2030   Pneumonia Vaccine  Completed   Hepatitis C Screening  Completed   HPV Vaccine  Aged Out   Meningitis B Vaccine  Aged Out   COVID-19 Vaccine  Discontinued    Advanced directives: (In Chart) A copy of your advanced directives are scanned into your chart should your provider ever need it. Advance Care Planning is important because it:  [x]  Makes sure you receive the medical care that is consistent with your values, goals, and preferences  [x]  It provides guidance to your family and loved ones and reduces their decisional burden about whether or  not they are making the right decisions based on your wishes.  Follow the link provided in your after visit summary or read over the paperwork we have mailed to you to help you started getting your Advance Directives in place. If you need assistance in completing these, please reach out to us  so that we can help you!  See attachments for Preventive Care and Fall Prevention Tips.   Fall Prevention in the Home, Adult Falls can cause injuries and affect people of all ages. There are many simple things that you can do to make your home safe and to help prevent falls. If you need it, ask for help making these changes. What actions can I take to prevent falls? General information Use good lighting in all rooms. Make sure to: Replace any light bulbs that burn out. Turn on lights if it is dark and use night-lights. Keep items that you use often in easy-to-reach places. Lower the shelves around your home if needed. Move furniture so that there are clear paths around it. Do not keep throw rugs or other things on the floor that can make you trip. If any of your floors are uneven, fix them. Add color or contrast paint or tape to clearly mark and help you see: Grab bars or handrails. First and last steps of staircases. Where the edge of each step is. If  you use a ladder or stepladder: Make sure that it is fully opened. Do not climb a closed ladder. Make sure the sides of the ladder are locked in place. Have someone hold the ladder while you use it. Know where your pets are as you move through your home. What can I do in the bathroom?     Keep the floor dry. Clean up any water  that is on the floor right away. Remove soap buildup in the bathtub or shower. Buildup makes bathtubs and showers slippery. Use non-skid mats or decals on the floor of the bathtub or shower. Attach bath mats securely with double-sided, non-slip rug tape. If you need to sit down while you are in the shower, use a non-slip  stool. Install grab bars by the toilet and in the bathtub and shower. Do not use towel bars as grab bars. What can I do in the bedroom? Make sure that you have a light by your bed that is easy to reach. Do not use any sheets or blankets on your bed that hang to the floor. Have a firm bench or chair with side arms that you can use for support when you get dressed. What can I do in the kitchen? Clean up any spills right away. If you need to reach something above you, use a sturdy step stool that has a grab bar. Keep electrical cables out of the way. Do not use floor polish or wax that makes floors slippery. What can I do with my stairs? Do not leave anything on the stairs. Make sure that you have a light switch at the top and the bottom of the stairs. Have them installed if you do not have them. Make sure that there are handrails on both sides of the stairs. Fix handrails that are broken or loose. Make sure that handrails are as long as the staircases. Install non-slip stair treads on all stairs in your home if they do not have carpet. Avoid having throw rugs at the top or bottom of stairs, or secure the rugs with carpet tape to prevent them from moving. Choose a carpet design that does not hide the edge of steps on the stairs. Make sure that carpet is firmly attached to the stairs. Fix any carpet that is loose or worn. What can I do on the outside of my home? Use bright outdoor lighting. Repair the edges of walkways and driveways and fix any cracks. Clear paths of anything that can make you trip, such as tools or rocks. Add color or contrast paint or tape to clearly mark and help you see high doorway thresholds. Trim any bushes or trees on the main path into your home. Check that handrails are securely fastened and in good repair. Both sides of all steps should have handrails. Install guardrails along the edges of any raised decks or porches. Have leaves, snow, and ice cleared regularly. Use  sand, salt, or ice melt on walkways during winter months if you live where there is ice and snow. In the garage, clean up any spills right away, including grease or oil spills. What other actions can I take? Review your medicines with your health care provider. Some medicines can make you confused or feel dizzy. This can increase your chance of falling. Wear closed-toe shoes that fit well and support your feet. Wear shoes that have rubber soles and low heels. Use a cane, walker, scooter, or crutches that help you move around if needed. Talk with your  provider about other ways that you can decrease your risk of falls. This may include seeing a physical therapist to learn to do exercises to improve movement and strength. Where to find more information Centers for Disease Control and Prevention, STEADI: TonerPromos.no [employer] on Aging: BaseRingTones.pl National Institute on Aging: BaseRingTones.pl Contact a health care provider if: You are afraid of falling at home. You feel weak, drowsy, or dizzy at home. You fall at home. Get help right away if you: Lose consciousness or have trouble moving after a fall. Have a fall that causes a head injury. These symptoms may be an emergency. Get help right away. Call 911. Do not wait to see if the symptoms will go away. Do not drive yourself to the hospital. This information is not intended to replace advice given to you by your health care provider. Make sure you discuss any questions you have with your health care provider. Document Revised: 06/28/2022 Document Reviewed: 06/28/2022 Elsevier Patient Education  2024 [employer].

## 2024-04-04 NOTE — Telephone Encounter (Signed)
 Patient aware his Rybelsus  has been received and he can come by to pick up.

## 2024-05-07 ENCOUNTER — Other Ambulatory Visit: Payer: Self-pay

## 2024-05-07 NOTE — Progress Notes (Signed)
   05/07/2024  Patient ID: Jeff [name]., male   DOB: 11-28-1950, 73 y.o.   MRN: [id number]  Subjective/Objective Telephone visit to follow-up on management of diabetes   Diabetes Management -Current  medications:  Rybelsus  14mg  daily, metformin  XR 1000mg  BID -Patient receives Rybelsus  through [facility] PAP and has approximately 3.5 months of Rybelsus  14mg  remaining at this time.  He recently was without medication due to shipping delays and just restarted taking daily about 2 weeks ago.  He reports tolerating medication well with no adverse GI effects. -Monitoring BG regularly and endorses values of 140-150 before bedtime- unsure how long after eating this typically is -A1c recently 9.9%, down slightly from 10.7% on 1/14  -Patient is inquiring about possible benefits of switching to Ozempic once he uses Rybelsus  on hand  Lethargy -Patient endorses ongoing lethargy and lack of energy ever since COVID19 diagnosis several years ago -This has impacted his ability to do certain things he was able to previously -CBC all WNL in May 2025 -Last Testosterone  values were August of 2024, and free T was decreased -No recent Vitamin D or B12 values   Assessment/Plan   Diabetes Management -Continue current medication regimen at this time and regular monitoring of BG -Likely that recent A1c would have been lower if patient had access to Rybelsus  14mg  consistently -I do recommend switching to Ozempic 1mg  weekly once patient runs out of Rybelsus  14mg  daily (around Oct)  Lethargy -Advised he can purchase Vitamin D and B12 OTC to take daily to see if symptoms improve 4-6 weeks of use -I recommend Vitamin D, B12, and Testosterone  levels at next follow-up to rule these out as being culprit   Follow-up:  2 months to follow-up on changing Rybelsus  to Ozempic   Channing DELENA Mealing, PharmD, DPLA

## 2024-06-08 ENCOUNTER — Other Ambulatory Visit: Payer: Self-pay | Admitting: Family Medicine

## 2024-06-08 NOTE — Telephone Encounter (Signed)
 Requested Prescriptions  Refused Prescriptions Disp Refills   doxazosin  (CARDURA ) 2 MG tablet [Pharmacy Med Name: DOXAZOSIN  MESYLATE 2 MG TAB] 180 tablet 1    Sig: TAKE 1 TABLET BY MOUTH 2 TIMES DAILY.     Cardiovascular:  Alpha Blockers Passed - 06/08/2024  3:29 PM      Passed - Last BP in normal range    BP Readings from Last 1 Encounters:  03/30/24 108/63         Passed - Valid encounter within last 6 months    Recent Outpatient Visits           2 months ago Type 2 diabetes mellitus with diabetic neuropathy, without long-term current use of insulin  [facility])   [place] [facility] [place], [place], DO

## 2024-06-20 ENCOUNTER — Other Ambulatory Visit: Payer: Self-pay | Admitting: Family Medicine

## 2024-06-20 ENCOUNTER — Other Ambulatory Visit: Payer: Self-pay | Admitting: Cardiovascular Disease

## 2024-06-20 DIAGNOSIS — I482 Chronic atrial fibrillation, unspecified: Secondary | ICD-10-CM

## 2024-06-20 MED ORDER — RIVAROXABAN 20 MG PO TABS: 20.0000 mg | ORAL_TABLET | Freq: Every day | ORAL | 1 refills | Status: DC

## 2024-06-20 NOTE — Telephone Encounter (Signed)
*  STAT* If patient is at the pharmacy, call can be transferred to refill team.   1. Which medications need to be refilled? (please list name of each medication and dose if known) rivaroxaban  (XARELTO ) 20 MG TABS tablet    2. Would you like to learn more about the convenience, safety, & potential cost savings by using the [facility]?    3. Are you open to using the [facility] (Type [facility]. ).   4. Which pharmacy/location (including street and city if local pharmacy) is medication to be sent to?  [id number] - [place], [place] - [address]     5. Do they need a 30 day or 90 day supply? 90 day

## 2024-06-20 NOTE — Telephone Encounter (Signed)
 Prescription refill request for Xarelto  received.  Indication:afib Last office visit:1/25 Weight:117.9 Age:73 Scr:1.55  5/25 CrCl:70.78  ml/min  Prescription refilled

## 2024-06-22 NOTE — Telephone Encounter (Signed)
 Xarelto  lasted refilled 06/20/24, duplicate request. Cardura  discontinued 11/08/23.  Requested Prescriptions  Pending Prescriptions Disp Refills   XARELTO  20 MG TABS tablet [Pharmacy Med Name: XARELTO  20 MG TABLET] 90 tablet 1    Sig: TAKE 1 TABLET BY MOUTH EVERY DAY     Hematology: Anticoagulants - rivaroxaban  Failed - 06/22/2024  1:54 PM      Failed - Cr in normal range and within 360 days    Creatinine  Date Value Ref Range Status  04/06/2013 0.83 0.60 - 1.30 mg/dL Final   Creatinine, Ser  Date Value Ref Range Status  03/30/2024 1.55 (H) 0.76 - 1.27 mg/dL Final         Passed - ALT in normal range and within 360 days    ALT  Date Value Ref Range Status  03/30/2024 19 0 - 44 IU/L Final   ALT (SGPT) Piccolo, Waived  Date Value Ref Range Status  09/07/2018 50 (H) 10 - 47 U/L Final         Passed - AST in normal range and within 360 days    AST  Date Value Ref Range Status  03/30/2024 19 0 - 40 IU/L Final   AST (SGOT) Piccolo, Waived  Date Value Ref Range Status  09/07/2018 48 (H) 11 - 38 U/L Final         Passed - HCT in normal range and within 360 days    Hematocrit  Date Value Ref Range Status  03/30/2024 49.0 37.5 - 51.0 % Final         Passed - HGB in normal range and within 360 days    Hemoglobin  Date Value Ref Range Status  03/30/2024 15.9 13.0 - 17.7 g/dL Final         Passed - PLT in normal range and within 360 days    Platelets  Date Value Ref Range Status  03/30/2024 175 150 - 450 x10E3/uL Final         Passed - eGFR is 15 or above and within 360 days    EGFR (African American)  Date Value Ref Range Status  04/06/2013 >60  Final   GFR calc Af Amer  Date Value Ref Range Status  03/13/2020 63 >59 mL/min/1.73 Final    Comment:    **[facility] currently reports eGFR in compliance with the current**   recommendations of the [employer]. [facility] will   update reporting as new guidelines are published from the NKF-ASN   Task force.     EGFR (Non-African Amer.)  Date Value Ref Range Status  04/06/2013 >60  Final    Comment:    eGFR values <33mL/min/1.73 m2 may be an indication of chronic kidney disease (CKD). Calculated eGFR is useful in patients with stable renal function. The eGFR calculation will not be reliable in acutely ill patients when serum creatinine is changing rapidly. It is not useful in  patients on dialysis. The eGFR calculation may not be applicable to patients at the low and high extremes of body sizes, pregnant women, and vegetarians.    GFR, Estimated  Date Value Ref Range Status  11/08/2023 47 (L) >60 mL/min Final    Comment:    (NOTE) Calculated using the CKD-EPI Creatinine Equation (2021) Performed at [facility], [address] [place], [place] [address]    eGFR  Date Value Ref Range Status  03/30/2024 47 (L) >59 mL/min/1.73 Final         Passed - Patient is not  pregnant      Passed - Valid encounter within last 12 months    Recent Outpatient Visits           2 months ago Type 2 diabetes mellitus with diabetic neuropathy, without long-term current use of insulin  (HCC)   [place] [facility] [place], Megan P, DO               doxazosin  (CARDURA ) 2 MG tablet [Pharmacy Med Name: DOXAZOSIN  MESYLATE 2 MG TAB] 180 tablet 1    Sig: TAKE 1 TABLET BY MOUTH 2 TIMES DAILY.     Cardiovascular:  Alpha Blockers Passed - 06/22/2024  1:54 PM      Passed - Last BP in normal range    BP Readings from Last 1 Encounters:  03/30/24 108/63         Passed - Valid encounter within last 6 months    Recent Outpatient Visits           2 months ago Type 2 diabetes mellitus with diabetic neuropathy, without long-term current use of insulin  [facility])   [place] [facility] [place], [place], DO

## 2024-07-02 ENCOUNTER — Ambulatory Visit: Admitting: Family Medicine

## 2024-07-05 ENCOUNTER — Telehealth: Payer: Self-pay

## 2024-07-05 ENCOUNTER — Other Ambulatory Visit: Payer: Self-pay

## 2024-07-05 DIAGNOSIS — E1165 Type 2 diabetes mellitus with hyperglycemia: Secondary | ICD-10-CM

## 2024-07-05 NOTE — Progress Notes (Signed)
   07/05/2024  Patient ID: Jeff [name]., male   DOB: Jul 08, 1951, 73 y.o.   MRN: [id number]  Subjective/Objective Telephone visit to follow-up on management of diabetes   Diabetes Management -Current  medications:  Rybelsus  14mg  daily, metformin  XR 1000mg  BID -Patient receives Rybelsus  through [facility] P[facility] and has approximately 2 weeks of Rybelsus  14mg  remaining at this time.  He tolerates this medication well with no adverse side effects.  Would like to chang to Ozempic for additional glucose lowering benefit -Monitoring BG regularly and endorses values of 130-140 before bedtime- unsure how long after eating this typically is -A1c recently 9.9%, down slightly from 10.7% on 1/14   Assessment/Plan   Diabetes Management -I recommend changing Rybelsus  14mg  daily to Ozempic 1mg  weekly (patient can get this through current [facility] PAP enrollment)- consulting PCP to see if in agreement and will work with medication assistance team on order change form if so -Continue metformin  XR 1000mg  BID -Continue to monitor BG daily -Sees PCP again 10/3 and will be due for A1c  Follow-up:  2 weeks to see how much Rybelsus  he has on hand since Ozempic will not arrive for a few weeks   Channing DELENA Mealing, PharmD, DPLA

## 2024-07-05 NOTE — Telephone Encounter (Signed)
 Faxing provider office [employer] re-order form (Ozempic) pt has been switch from Rybelsus  to Ozempic,faxed to [phone] for provider to sign and date can be fax back to [phone] or to [facility].

## 2024-07-11 NOTE — Telephone Encounter (Signed)
 Received provider portion back of refill reorder [facility] Ozempic,faxed it back to [facility] today.

## 2024-07-16 NOTE — Telephone Encounter (Signed)
 Received approval letter from [facility] on Ozempic thru 11/07/2024,left a HIPAA VM.

## 2024-07-17 ENCOUNTER — Telehealth: Payer: Self-pay

## 2024-07-17 ENCOUNTER — Other Ambulatory Visit: Payer: Self-pay

## 2024-07-17 NOTE — Telephone Encounter (Signed)
 4 boxes of Ozempic received for the patient. Tried calling to notify patient that medication is ready for pick up, no answer and no VM available.   OK for E2C2 to let the patient know that his medication is ready to be picked up if he calls back.

## 2024-07-17 NOTE — Telephone Encounter (Signed)
 The patient returned the call and states he will be by tomorrow to pick up his medicine and he appreciates it.

## 2024-07-17 NOTE — Progress Notes (Signed)
   07/17/2024  Patient ID: Jeff [name]., male   DOB: 1951/03/25, 73 y.o.   MRN: [id number]  Subjective/Objective Telephone visit to follow-up on management of diabetes   Diabetes Management -Current  medications:  Rybelsus  14mg  daily, metformin  XR 1000mg  BID -Patient has been approved to receive Ozempic 1mg  through [facility] P[facility] and will switch from Rybelsus  14mg  daily once medication is received from [facility] (should arrive by 9/25).  He endorses having approximately 3 weeks remaining of Rybelsus  14mg . -Monitoring BG regularly and endorses values of 130-140 before bedtime- unsure how long after eating this typically is -A1c recently 9.9%, down slightly from 10.7% on 1/14   Assessment/Plan   Diabetes Management -Continue Rybelsus  14mg  daily until Ozempic 1mg  arrives from [facility], then stop Rybelsus  and initiate Ozempic -Educated patient on use and administration of Ozempic  -Continue metformin  XR 1000mg  BID -Continue to monitor BG daily -Sees PCP again 10/3 and will be due for A1c  Follow-up:  Will check in with patient approximately 4 weeks after initiation of Ozempic    Dyane Broberg A Maizie Garno, PharmD, DPLA

## 2024-08-01 ENCOUNTER — Ambulatory Visit (INDEPENDENT_AMBULATORY_CARE_PROVIDER_SITE_OTHER): Admitting: Family Medicine

## 2024-08-01 ENCOUNTER — Encounter: Payer: Self-pay | Admitting: Family Medicine

## 2024-08-01 VITALS — BP 131/83 | HR 65 | Ht 71.0 in | Wt 274.2 lb

## 2024-08-01 DIAGNOSIS — L03011 Cellulitis of right finger: Secondary | ICD-10-CM | POA: Diagnosis not present

## 2024-08-01 MED ORDER — SULFAMETHOXAZOLE-TRIMETHOPRIM 800-160 MG PO TABS: 1.0000 | ORAL_TABLET | Freq: Two times a day (BID) | ORAL | 0 refills | Status: DC

## 2024-08-01 NOTE — Progress Notes (Signed)
 BP 131/83 (BP Location: Left Arm, Patient Position: Sitting, Cuff Size: Large)   Pulse 65   Ht 5' 11 (1.803 m)   Wt 274 lb 3.2 oz (124.4 kg)   SpO2 100%   BMI 38.24 kg/m    Subjective:    Patient ID: Jeff [name]., male    DOB: 09/28/1951, 73 y.o.   MRN: [id number]  HPI: [name]. is a 73 y.o. male  Chief Complaint  Patient presents with   Follow-up    Index finger, right hand, has hit it against something while on a beach trip, had become infected, applied peroxide and neosporin    SKIN INFECTION Duration: about 4-5 days Location: R index finger History of trauma in area: unknown Pain: yes Quality: aching and tight Severity: moderate Redness: yes Swelling: yes Oozing: yes Pus: yes Fevers: no Nausea/vomiting: no Status: stable Treatments attempted:warm compresses  Tetanus: UTD   Relevant past medical, surgical, family and social history reviewed and updated as indicated. Interim medical history since our last visit reviewed. Allergies and medications reviewed and updated.  Review of Systems  Constitutional: Negative.   Respiratory: Negative.    Cardiovascular: Negative.   Musculoskeletal: Negative.   Skin:  Positive for color change. Negative for pallor, rash and wound.  Neurological: Negative.   Psychiatric/Behavioral: Negative.      Per HPI unless specifically indicated above     Objective:    BP 131/83 (BP Location: Left Arm, Patient Position: Sitting, Cuff Size: Large)   Pulse 65   Ht 5' 11 (1.803 m)   Wt 274 lb 3.2 oz (124.4 kg)   SpO2 100%   BMI 38.24 kg/m   Wt Readings from Last 3 Encounters:  08/01/24 274 lb 3.2 oz (124.4 kg)  04/03/24 260 lb (117.9 kg)  03/30/24 275 lb 9.6 oz (125 kg)    Physical Exam Vitals and nursing note reviewed.  Constitutional:      General: He is not in acute distress.    Appearance: Normal appearance. He is well-developed.  HENT:     Head: Normocephalic and atraumatic.     Right Ear:  Hearing and external ear normal.     Left Ear: Hearing and external ear normal.     Nose: Nose normal.     Mouth/Throat:     Mouth: Mucous membranes are moist.     Pharynx: Oropharynx is clear.  Eyes:     General: Lids are normal. No scleral icterus.       Right eye: No discharge.        Left eye: No discharge.     Conjunctiva/sclera: Conjunctivae normal.  Pulmonary:     Effort: Pulmonary effort is normal. No respiratory distress.  Musculoskeletal:        General: Normal range of motion.     Comments: Red swollen R index finger above the PIP joint  Skin:    Coloration: Skin is not jaundiced or pale.     Findings: No bruising, erythema, lesion or rash.  Neurological:     Mental Status: He is alert. Mental status is at baseline. He is disoriented.  Psychiatric:        Mood and Affect: Mood normal.        Speech: Speech normal.        Behavior: Behavior normal.        Thought Content: Thought content normal.        Judgment: Judgment normal.     Results  for orders placed or performed in visit on 03/30/24  Bayer DCA Hb A1c Waived   Collection Time: 03/30/24  2:26 PM  Result Value Ref Range   HB A1C (BAYER DCA - WAIVED) 9.9 (H) 4.8 - 5.6 %  CBC with Differential/Platelet   Collection Time: 03/30/24  2:27 PM  Result Value Ref Range   WBC 6.9 3.4 - 10.8 x10E3/uL   RBC 5.62 4.14 - 5.80 x10E6/uL   Hemoglobin 15.9 13.0 - 17.7 g/dL   Hematocrit 50.9 62.4 - 51.0 %   MCV 87 79 - 97 fL   MCH 28.3 26.6 - 33.0 pg   MCHC 32.4 31.5 - 35.7 g/dL   RDW 85.6 88.3 - 84.5 %   Platelets 175 150 - 450 x10E3/uL   Neutrophils 71 Not Estab. %   Lymphs 18 Not Estab. %   Monocytes 8 Not Estab. %   Eos 1 Not Estab. %   Basos 1 Not Estab. %   Neutrophils Absolute 5.0 1.4 - 7.0 x10E3/uL   Lymphocytes Absolute 1.2 0.7 - 3.1 x10E3/uL   Monocytes Absolute 0.6 0.1 - 0.9 x10E3/uL   EOS (ABSOLUTE) 0.1 0.0 - 0.4 x10E3/uL   Basophils Absolute 0.0 0.0 - 0.2 x10E3/uL   Immature Granulocytes 1 Not Estab.  %   Immature Grans (Abs) 0.1 0.0 - 0.1 x10E3/uL  Comprehensive metabolic panel with GFR   Collection Time: 03/30/24  2:27 PM  Result Value Ref Range   Glucose 208 (H) 70 - 99 mg/dL   BUN 30 (H) 8 - 27 mg/dL   Creatinine, Ser 8.44 (H) 0.76 - 1.27 mg/dL   eGFR 47 (L) >40 fO/fpw/8.26   BUN/Creatinine Ratio 19 10 - 24   Sodium 136 134 - 144 mmol/L   Potassium 5.0 3.5 - 5.2 mmol/L   Chloride 97 96 - 106 mmol/L   CO2 22 20 - 29 mmol/L   Calcium  9.4 8.6 - 10.2 mg/dL   Total Protein 6.6 6.0 - 8.5 g/dL   Albumin 4.6 3.8 - 4.8 g/dL   Globulin, Total 2.0 1.5 - 4.5 g/dL   Bilirubin Total 0.7 0.0 - 1.2 mg/dL   Alkaline Phosphatase 75 44 - 121 IU/L   AST 19 0 - 40 IU/L   ALT 19 0 - 44 IU/L  Lipid Panel w/o Chol/HDL Ratio   Collection Time: 03/30/24  2:27 PM  Result Value Ref Range   Cholesterol, Total 135 100 - 199 mg/dL   Triglycerides 876 0 - 149 mg/dL   HDL 34 (L) >60 mg/dL   VLDL Cholesterol Cal 22 5 - 40 mg/dL   LDL Chol Calc (NIH) 79 0 - 99 mg/dL      Assessment & Plan:   Problem List Items Addressed This Visit   None Visit Diagnoses       Cellulitis of finger of right hand    -  Primary   No pus pockets today to drain. Will treat with bactrim . Call if not significantly better in 24 hours and we'll get him into hand specialist. Follow up next week        Follow up plan: Return for As scheduled.

## 2024-08-02 ENCOUNTER — Ambulatory Visit: Admitting: Family Medicine

## 2024-08-02 DIAGNOSIS — H40023 Open angle with borderline findings, high risk, bilateral: Secondary | ICD-10-CM | POA: Diagnosis not present

## 2024-08-02 DIAGNOSIS — E113293 Type 2 diabetes mellitus with mild nonproliferative diabetic retinopathy without macular edema, bilateral: Secondary | ICD-10-CM | POA: Diagnosis not present

## 2024-08-02 DIAGNOSIS — I10 Essential (primary) hypertension: Secondary | ICD-10-CM | POA: Diagnosis not present

## 2024-08-02 DIAGNOSIS — H35033 Hypertensive retinopathy, bilateral: Secondary | ICD-10-CM | POA: Diagnosis not present

## 2024-08-02 DIAGNOSIS — H25813 Combined forms of age-related cataract, bilateral: Secondary | ICD-10-CM | POA: Diagnosis not present

## 2024-08-02 DIAGNOSIS — H524 Presbyopia: Secondary | ICD-10-CM | POA: Diagnosis not present

## 2024-08-02 LAB — OPHTHALMOLOGY REPORT-SCANNED

## 2024-08-03 ENCOUNTER — Telehealth: Payer: Self-pay

## 2024-08-03 NOTE — Progress Notes (Signed)
   08/03/2024  Patient ID: Jeff [name]., male   DOB: 22-Jun-1951, 73 y.o.   MRN: [id number]  Four boxes of Ozemipc 1mg  have been received from [facility] P[facility], and these were picked up 9/11.  Contacting patient to see if he has started medication yet and to schedule a telephone visit in a few weeks to see how he is tolerating and how home BG readings have been.    Patient is almost out of Rybelsus  14mg  and will be switching to Ozempic 1mg  weekly after last dose of Rybelsus .  Counseled on administration of medication and scheduled a telephone follow-up for Monday 10/20 at 130pm.  Channing DELENA Mealing, PharmD, DPLA

## 2024-08-10 ENCOUNTER — Ambulatory Visit: Admitting: Family Medicine

## 2024-08-10 ENCOUNTER — Encounter: Payer: Self-pay | Admitting: Family Medicine

## 2024-08-10 VITALS — BP 122/82 | HR 80 | Ht 71.0 in | Wt 272.8 lb

## 2024-08-10 DIAGNOSIS — E1140 Type 2 diabetes mellitus with diabetic neuropathy, unspecified: Secondary | ICD-10-CM

## 2024-08-10 DIAGNOSIS — L03011 Cellulitis of right finger: Secondary | ICD-10-CM | POA: Diagnosis not present

## 2024-08-10 DIAGNOSIS — Z23 Encounter for immunization: Secondary | ICD-10-CM

## 2024-08-10 LAB — MICROALBUMIN, URINE WAIVED
Creatinine, Urine Waived: 100 mg/dL (ref 10–300)
Microalb, Ur Waived: 80 mg/L — ABNORMAL HIGH (ref 0–19)

## 2024-08-10 LAB — BAYER DCA HB A1C WAIVED: HB A1C (BAYER DCA - WAIVED): 10.9 % — ABNORMAL HIGH (ref 4.8–5.6)

## 2024-08-10 MED ORDER — SEMAGLUTIDE (2 MG/DOSE) 8 MG/3ML ~~LOC~~ SOPN: 2.0000 mg | PEN_INJECTOR | SUBCUTANEOUS | 1 refills | Status: DC

## 2024-08-10 NOTE — Progress Notes (Signed)
 BP 122/82   Pulse 80   Ht 5' 11 (1.803 m)   Wt 272 lb 12.8 oz (123.7 kg)   SpO2 98%   BMI 38.05 kg/m    Subjective:    Patient ID: Jeff [name]., male    DOB: [birth year], 73 y.o.   MRN: [id number]  HPI: Jeff [name]. is a 73 y.o. male  Chief Complaint  Patient presents with   Cellulitis    Finger on right hand   Diabetes   DIABETES Hypoglycemic episodes:no Polydipsia/polyuria: no Visual disturbance: no Chest pain: no Paresthesias: no Glucose Monitoring: no  Accucheck frequency: rarely Taking Insulin ?: no Blood Pressure Monitoring: rarely Retinal Examination: Up to Date Foot Exam: Up to Date Diabetic Education: Completed Pneumovax: Up to Date Influenza: Up to Date Aspirin : yes   Relevant past medical, surgical, family and social history reviewed and updated as indicated. Interim medical history since our last visit reviewed. Allergies and medications reviewed and updated.  Review of Systems  Constitutional: Negative.   Respiratory: Negative.    Cardiovascular: Negative.   Musculoskeletal: Negative.   Neurological: Negative.   Psychiatric/Behavioral: Negative.      Per HPI unless specifically indicated above     Objective:    BP 122/82   Pulse 80   Ht 5' 11 (1.803 m)   Wt 272 lb 12.8 oz (123.7 kg)   SpO2 98%   BMI 38.05 kg/m   Wt Readings from Last 3 Encounters:  08/10/24 272 lb 12.8 oz (123.7 kg)  08/01/24 274 lb 3.2 oz (124.4 kg)  04/03/24 260 lb (117.9 kg)    Physical Exam Vitals and nursing note reviewed.  Constitutional:      General: He is not in acute distress.    Appearance: Normal appearance. He is obese. He is not ill-appearing, toxic-appearing or diaphoretic.  HENT:     Head: Normocephalic and atraumatic.     Right Ear: External ear normal.     Left Ear: External ear normal.     Nose: Nose normal.     Mouth/Throat:     Mouth: Mucous membranes are moist.     Pharynx: Oropharynx is clear.  Eyes:     General: No  scleral icterus.       Right eye: No discharge.        Left eye: No discharge.     Extraocular Movements: Extraocular movements intact.     Conjunctiva/sclera: Conjunctivae normal.     Pupils: Pupils are equal, round, and reactive to light.  Cardiovascular:     Rate and Rhythm: Normal rate and regular rhythm.     Pulses: Normal pulses.     Heart sounds: Normal heart sounds. No murmur heard.    No friction rub. No gallop.  Pulmonary:     Effort: Pulmonary effort is normal. No respiratory distress.     Breath sounds: Normal breath sounds. No stridor. No wheezing, rhonchi or rales.  Chest:     Chest wall: No tenderness.  Musculoskeletal:        General: Normal range of motion.     Cervical back: Normal range of motion and neck supple.  Skin:    General: Skin is warm and dry.     Capillary Refill: Capillary refill takes less than 2 seconds.     Coloration: Skin is not jaundiced or pale.     Findings: No bruising, erythema, lesion or rash.  Neurological:     General: No focal deficit present.  Mental Status: He is alert and oriented to person, place, and time. Mental status is at baseline.  Psychiatric:        Mood and Affect: Mood normal.        Behavior: Behavior normal.        Thought Content: Thought content normal.        Judgment: Judgment normal.     Results for orders placed or performed in visit on 03/30/24  Bayer DCA Hb A1c Waived   Collection Time: 03/30/24  2:26 PM  Result Value Ref Range   HB A1C (BAYER DCA - WAIVED) 9.9 (H) 4.8 - 5.6 %  CBC with Differential/Platelet   Collection Time: 03/30/24  2:27 PM  Result Value Ref Range   WBC 6.9 3.4 - 10.8 x10E3/uL   RBC 5.62 4.14 - 5.80 x10E6/uL   Hemoglobin 15.9 13.0 - 17.7 g/dL   Hematocrit 50.9 62.4 - 51.0 %   MCV 87 79 - 97 fL   MCH 28.3 26.6 - 33.0 pg   MCHC 32.4 31.5 - 35.7 g/dL   RDW 85.6 88.3 - 84.5 %   Platelets 175 150 - 450 x10E3/uL   Neutrophils 71 Not Estab. %   Lymphs 18 Not Estab. %   Monocytes  8 Not Estab. %   Eos 1 Not Estab. %   Basos 1 Not Estab. %   Neutrophils Absolute 5.0 1.4 - 7.0 x10E3/uL   Lymphocytes Absolute 1.2 0.7 - 3.1 x10E3/uL   Monocytes Absolute 0.6 0.1 - 0.9 x10E3/uL   EOS (ABSOLUTE) 0.1 0.0 - 0.4 x10E3/uL   Basophils Absolute 0.0 0.0 - 0.2 x10E3/uL   Immature Granulocytes 1 Not Estab. %   Immature Grans (Abs) 0.1 0.0 - 0.1 x10E3/uL  Comprehensive metabolic panel with GFR   Collection Time: 03/30/24  2:27 PM  Result Value Ref Range   Glucose 208 (H) 70 - 99 mg/dL   BUN 30 (H) 8 - 27 mg/dL   Creatinine, Ser 8.44 (H) 0.76 - 1.27 mg/dL   eGFR 47 (L) >40 fO/fpw/8.26   BUN/Creatinine Ratio 19 10 - 24   Sodium 136 134 - 144 mmol/L   Potassium 5.0 3.5 - 5.2 mmol/L   Chloride 97 96 - 106 mmol/L   CO2 22 20 - 29 mmol/L   Calcium  9.4 8.6 - 10.2 mg/dL   Total Protein 6.6 6.0 - 8.5 g/dL   Albumin 4.6 3.8 - 4.8 g/dL   Globulin, Total 2.0 1.5 - 4.5 g/dL   Bilirubin Total 0.7 0.0 - 1.2 mg/dL   Alkaline Phosphatase 75 44 - 121 IU/L   AST 19 0 - 40 IU/L   ALT 19 0 - 44 IU/L  Lipid Panel w/o Chol/HDL Ratio   Collection Time: 03/30/24  2:27 PM  Result Value Ref Range   Cholesterol, Total 135 100 - 199 mg/dL   Triglycerides 876 0 - 149 mg/dL   HDL 34 (L) >60 mg/dL   VLDL Cholesterol Cal 22 5 - 40 mg/dL   LDL Chol Calc (NIH) 79 0 - 99 mg/dL      Assessment & Plan:   Problem List Items Addressed This Visit       Endocrine   Type 2 diabetes mellitus with diabetic neuropathy, unspecified (HCC) - Primary   Not doing well with A1c of 10.9- he is starting ozempic on Monday- will do 1 month of 1mg  then up to 2mg . Really work on diet and exercise. Likely will need to change to [facility]- check in  in 2 months. Call with any concerns.       Relevant Medications   Semaglutide , 2 MG/DOSE, 8 MG/3ML SOPN (Start on 09/07/2024)   Other Relevant Orders   Bayer DCA Hb A1c Waived   Microalbumin, Urine Waived   Other Visit Diagnoses       Cellulitis of finger of right  hand       Resolved.     Needs flu shot       Relevant Orders   Flu vaccine HIGH DOSE PF(Fluzone Trivalent)        Follow up plan: Return in about 2 months (around 10/10/2024).

## 2024-08-10 NOTE — Assessment & Plan Note (Signed)
 Not doing well with A1c of 10.9- he is starting ozempic on Monday- will do 1 month of 1mg  then up to 2mg . Really work on diet and exercise. Likely will need to change to [facility]- check in in 2 months. Call with any concerns.

## 2024-08-13 ENCOUNTER — Telehealth: Payer: Self-pay

## 2024-08-13 NOTE — Progress Notes (Signed)
   08/13/2024  Patient ID: Jeff [name]., male   DOB: 1951/04/02, 73 y.o.   MRN: [id number]  Patient contacted me in regard to Ozempic he receives through [facility].  Patient just recently got 4 boxes of Ozempic1mg  and took his first dose today.  He was seen by Dr. Vicci on 10/3, and his A1c had increased to 10.9%; so she would like for him to increase Ozempic to 2mg  after completing 4 weeks at 1mg .  Coordinating with the medication assistance team to work on an order change form for this.  Advised patient that if 2mg  pens do not arrive when it is time to increase Ozempic to 2mg  (11/3), he can use 2 injections of 1mg  to equal a 2mg  dose.  Channing DELENA Mealing, PharmD, DPLA

## 2024-08-13 NOTE — Telephone Encounter (Signed)
 Filled and faxed provider portion [employer] increase to ozempic 2 mg to provider office to sign and date can be fax to [employer] or fax back to [phone].

## 2024-08-14 NOTE — Progress Notes (Signed)
 [employer].                                          MRN: [id number]   08/14/2024   The VBCI Quality Team Specialist reviewed this patient medical record for the purposes of chart review for care gap closure. The following were reviewed: abstraction for care gap closure-kidney health evaluation for diabetes:eGFR  and uACR.    VBCI Quality Team

## 2024-08-15 NOTE — Telephone Encounter (Signed)
 Received provider portion [facility] Ozempic, faxed to [facility] today.

## 2024-08-16 NOTE — Progress Notes (Signed)
 [employer].                                          MRN: [id number]   08/16/2024   The VBCI Quality Team Specialist reviewed this patient medical record for the purposes of chart review for care gap closure. The following were reviewed: chart review for care gap closure-glycemic status assessment.    VBCI Quality Team

## 2024-08-27 ENCOUNTER — Other Ambulatory Visit: Payer: Self-pay

## 2024-08-27 NOTE — Progress Notes (Unsigned)
   08/27/2024  Patient ID: Jeff [name]., male   DOB: January 22, 1951, 73 y.o.   MRN: [id number]  Outreach attempt for telephone follow-up visit was not successful, and I was not able to leave a voicemail.  I will attempt to reach the patient again later this week or next.  Channing DELENA Mealing, PharmD, DPLA

## 2024-08-31 ENCOUNTER — Other Ambulatory Visit: Payer: Self-pay

## 2024-08-31 ENCOUNTER — Telehealth: Payer: Self-pay

## 2024-08-31 NOTE — Telephone Encounter (Signed)
 Ozempic received for the patient. Called and notified patient that medication was ready to be picked up.

## 2024-08-31 NOTE — Progress Notes (Unsigned)
   08/31/2024  Patient ID: Jeff [name]., male   DOB: 10/06/1951, 73 y.o.   MRN: [id number]  Outreach attempt to follow-up on management of chronic disease states was not successful, and I was not able to leave a message.  Patient does not log into MyChart regularly, so I will not send a message.  I will try to reach him again next week.  Channing DELENA Mealing, PharmD, DPLA

## 2024-09-03 NOTE — Progress Notes (Unsigned)
   09/04/2024  Patient ID: Jeff Dasie Royetta Mickey., male   DOB: [birth year], 73 y.o.   MRN: [id number]  Subjective/Objective Telephone visit to follow-up on management of diabetes   Diabetes Management -Current  medications:  Ozempic 1mg  weekly, metformin  XR 1000mg  BID -Patient has completed 4-5 weeks of Ozempic 1mg  weekly and endorses tolerating well with no adverse side effects. Recently picked up Ozempic 2mg  that arrived from [facility] and is due for his next injection Monday/ 11/3 -Patient does monitor home BG regularly and states FBG averages 130-135; however, most recent A1c was elevated at 10.9% on 10/3- up from 9.9% previously -Statin for ASCVD risk reduction:  atorvastatin  20mg  daily; LDL 79, TG 123 on 5/23 -ACEi/ARB for cardiorenal protection:  benazepril  20mg ; OV BP on 10/3 was 122/82 -UACR 30-300 on 10/3  Assessment/Plan   Diabetes Management -A1c, LDL, and UACR not at goal; BP is controlled -Increase Ozempic to 2mg  weekly using 2 injections of 1mg  to not waste 1mg  pens he has left before starting 2mg  pens just received (since he will not continue to get medication at no cost through [facility] starting in January) -Continue metformin  XR 1000mg  BID -Continue to monitor FBG daily -Patient sees PCP again 12/3; I recommend a follow-up lipid panel and CMP at that time.  If LDL >70, consider increasing atorvastatin  to 40mg  daily -Consider addition of SGLT2 once A1c <9% and verify no ketones in the urine (to help prevent risk of DKA); patient would likely qualify for AZ&Me PAP for Farxiga  if Jardiance  or Farxiga  too expensive on insurance -Due for A1c again 1/3; I recommend before end of 2025 (at next visit) if insurance will cover again  Follow-up:  4 weeks   Jeff [name], PharmD, DPLA

## 2024-09-04 ENCOUNTER — Other Ambulatory Visit: Payer: Self-pay

## 2024-09-04 DIAGNOSIS — E1140 Type 2 diabetes mellitus with diabetic neuropathy, unspecified: Secondary | ICD-10-CM

## 2024-09-04 DIAGNOSIS — I129 Hypertensive chronic kidney disease with stage 1 through stage 4 chronic kidney disease, or unspecified chronic kidney disease: Secondary | ICD-10-CM

## 2024-09-04 DIAGNOSIS — E782 Mixed hyperlipidemia: Secondary | ICD-10-CM

## 2024-09-15 ENCOUNTER — Other Ambulatory Visit: Payer: Self-pay | Admitting: Family Medicine

## 2024-09-17 NOTE — Telephone Encounter (Signed)
 Requested Prescriptions  Pending Prescriptions Disp Refills   atorvastatin  (LIPITOR) 20 MG tablet [Pharmacy Med Name: ATORVASTATIN  20 MG TABLET] 45 tablet 0    Sig: TAKE 1/2 TABLET BY MOUTH DAILY     Cardiovascular:  Antilipid - Statins Failed - 09/17/2024  1:57 PM      Failed - Lipid Panel in normal range within the last 12 months    Cholesterol, Total  Date Value Ref Range Status  03/30/2024 135 100 - 199 mg/dL Final   Cholesterol Piccolo, Waived  Date Value Ref Range Status  09/07/2018 130 <200 mg/dL Final    Comment:                            Desirable                <200                         Borderline High      200- 239                         High                     >239    LDL Chol Calc (NIH)  Date Value Ref Range Status  03/30/2024 79 0 - 99 mg/dL Final   HDL  Date Value Ref Range Status  03/30/2024 34 (L) >39 mg/dL Final   Triglycerides  Date Value Ref Range Status  03/30/2024 123 0 - 149 mg/dL Final   Triglyceride fasting, serum  Date Value Ref Range Status  02/17/2010 111 mg/dL    Triglycerides Piccolo,Waived  Date Value Ref Range Status  09/07/2018 95 <150 mg/dL Final    Comment:                            Normal                   <150                         Borderline High     150 - 199                         High                200 - 499                         Very High                >499          Passed - Patient is not pregnant      Passed - Valid encounter within last 12 months    Recent Outpatient Visits           1 month ago Type 2 diabetes mellitus with diabetic neuropathy, without long-term current use of insulin  (HCC)   [facility] [place], Megan P, DO   1 month ago Cellulitis of finger of right hand    [facility] [place], Megan P, DO   5 months ago Type 2 diabetes mellitus with diabetic neuropathy, without long-term current use of insulin  (HCC)   [facility] [place], [place], [place]

## 2024-10-01 ENCOUNTER — Other Ambulatory Visit: Payer: Self-pay

## 2024-10-01 DIAGNOSIS — E1140 Type 2 diabetes mellitus with diabetic neuropathy, unspecified: Secondary | ICD-10-CM

## 2024-10-01 NOTE — Progress Notes (Signed)
   10/01/24  Patient ID: Patsy Dasie Royetta Mickey., male   DOB: [birth year], 73 y.o.   MRN: [id number]  Subjective/Objective Telephone visit to follow-up on management of diabetes   Diabetes Management -Current  medications:  Ozempic  1mg  weekly, metformin  XR 1000mg  BID -Patient has completed approximately 8 weeks of Ozempic  1mg  weekly and endorses tolerating well with no adverse side effects.  -He has Ozempic  2mg  pens from [facility] (3 boxes), but he wanted to use up all 1mg  pens on hand before increasing to this dose.  I had advised using 2 injections of 1mg  during our last telephone visit, but he has continued with 1mg  weekly. -Patient does monitor home BG regularly and states FBG averages 130-135; however, most recent A1c was elevated at 10.9% on 10/3- up from 9.9% previously -Does not endorse any s/sx of hypoglycemia -Statin for ASCVD risk reduction:  atorvastatin  20mg  daily; LDL 79, TG 123 on 5/23 -ACEi/ARB for cardiorenal protection:  benazepril  20mg ; OV BP on 10/3 was 122/82 -UACR 30-300 on 10/3  Assessment/Plan   Diabetes Management -A1c, LDL, and UACR not at goal; BP is controlled -Increase Ozempic  to 2mg  weekly with next dose (Monday 12/1) -Continue metformin  XR 1000mg  BID -Continue to monitor FBG daily -Patient sees PCP again 12/3; I recommend a follow-up lipid panel and CMP at that time.  If LDL >70, consider increasing atorvastatin  to 40mg  daily -Consider addition of SGLT2 once A1c <9% and verify no ketones in the urine (to help prevent risk of DKA); patient would likely qualify for AZ&Me PAP for Farxiga  if Jardiance  or Farxiga  too expensive on insurance -Due for A1c again 1/3; I recommend before end of 2025 (at next visit) if insurance will cover again  Follow-up:  10 weeks   Channing DELENA Mealing, PharmD, DPLA

## 2024-10-05 ENCOUNTER — Other Ambulatory Visit: Payer: Self-pay | Admitting: Family Medicine

## 2024-10-09 NOTE — Telephone Encounter (Signed)
 Requested medication (s) are due for refill today: expired medication date  Requested medication (s) are on the active medication list: yes   Last refill:  08/19/23 #48 ml 2 refills  Future visit scheduled: yes 10/10/24  Notes to clinic:   expired medication date. Do you want to renew Rx?     Requested Prescriptions  Pending Prescriptions Disp Refills   fluticasone  (FLONASE ) 50 MCG/ACT nasal spray [Pharmacy Med Name: FLUTICASONE  PROP 50 MCG SPRAY] 48 mL 2    Sig: Place 2 sprays into both nostrils daily as needed for allergies or rhinitis.     Ear, Nose, and Throat: Nasal Preparations - Corticosteroids Passed - 10/09/2024  1:38 PM      Passed - Valid encounter within last 12 months    Recent Outpatient Visits           2 months ago Type 2 diabetes mellitus with diabetic neuropathy, without long-term current use of insulin  [facility])   [facility] [place], Megan P, DO   2 months ago Cellulitis of finger of right hand   [facility] [place], Megan P, DO   6 months ago Type 2 diabetes mellitus with diabetic neuropathy, without long-term current use of insulin  [facility])   [facility] [place], Megan P, DO

## 2024-10-10 ENCOUNTER — Encounter: Payer: Self-pay | Admitting: Family Medicine

## 2024-10-10 ENCOUNTER — Ambulatory Visit: Admitting: Family Medicine

## 2024-10-10 VITALS — BP 118/82 | HR 90 | Temp 98.0°F | Ht 71.0 in | Wt 259.4 lb

## 2024-10-10 DIAGNOSIS — N1831 Chronic kidney disease, stage 3a: Secondary | ICD-10-CM

## 2024-10-10 DIAGNOSIS — I509 Heart failure, unspecified: Secondary | ICD-10-CM | POA: Diagnosis not present

## 2024-10-10 DIAGNOSIS — E1122 Type 2 diabetes mellitus with diabetic chronic kidney disease: Secondary | ICD-10-CM | POA: Diagnosis not present

## 2024-10-10 DIAGNOSIS — Z7985 Long-term (current) use of injectable non-insulin antidiabetic drugs: Secondary | ICD-10-CM | POA: Diagnosis not present

## 2024-10-10 DIAGNOSIS — I482 Chronic atrial fibrillation, unspecified: Secondary | ICD-10-CM | POA: Diagnosis not present

## 2024-10-10 DIAGNOSIS — C641 Malignant neoplasm of right kidney, except renal pelvis: Secondary | ICD-10-CM | POA: Diagnosis not present

## 2024-10-10 DIAGNOSIS — E1140 Type 2 diabetes mellitus with diabetic neuropathy, unspecified: Secondary | ICD-10-CM

## 2024-10-10 LAB — BAYER DCA HB A1C WAIVED: HB A1C (BAYER DCA - WAIVED): 7.4 % — ABNORMAL HIGH (ref 4.8–5.6)

## 2024-10-10 MED ORDER — RIVAROXABAN 20 MG PO TABS: 20.0000 mg | ORAL_TABLET | Freq: Every day | ORAL | 1 refills | Status: AC

## 2024-10-10 MED ORDER — ATORVASTATIN CALCIUM 20 MG PO TABS: 10.0000 mg | ORAL_TABLET | Freq: Every day | ORAL | 1 refills | Status: AC

## 2024-10-10 MED ORDER — FUROSEMIDE 20 MG PO TABS: 20.0000 mg | ORAL_TABLET | Freq: Every day | ORAL | 1 refills | Status: AC

## 2024-10-10 MED ORDER — BENAZEPRIL HCL 20 MG PO TABS: 20.0000 mg | ORAL_TABLET | Freq: Every day | ORAL | 1 refills | Status: AC

## 2024-10-10 MED ORDER — SEMAGLUTIDE (2 MG/DOSE) 8 MG/3ML ~~LOC~~ SOPN: 2.0000 mg | PEN_INJECTOR | SUBCUTANEOUS | 1 refills | Status: AC

## 2024-10-10 MED ORDER — METFORMIN HCL ER 500 MG PO TB24: 1000.0000 mg | ORAL_TABLET | Freq: Two times a day (BID) | ORAL | 1 refills | Status: AC | PRN

## 2024-10-10 MED ORDER — METOPROLOL TARTRATE 75 MG PO TABS: 75.0000 mg | ORAL_TABLET | Freq: Two times a day (BID) | ORAL | 1 refills | Status: AC

## 2024-10-10 MED ORDER — GABAPENTIN 100 MG PO CAPS: 100.0000 mg | ORAL_CAPSULE | Freq: Three times a day (TID) | ORAL | 1 refills | Status: AC

## 2024-10-10 NOTE — Progress Notes (Signed)
 BP 118/82   Pulse 90   Temp 98 F (36.7 C) (Oral)   Ht 5' 11 (1.803 m)   Wt 259 lb 6.4 oz (117.7 kg)   SpO2 91%   BMI 36.18 kg/m    Subjective:    Patient ID: Jeff Dasie Royetta Mickey., male    DOB: 1950/11/28, 73 y.o.   MRN: [id number]  HPI: Jeff Salzwedel. is a 73 y.o. male  Chief Complaint  Patient presents with   Diabetes    bp   DIABETES Hypoglycemic episodes:no Polydipsia/polyuria: no Visual disturbance: no Chest pain: no Paresthesias: yes Glucose Monitoring: no  Accucheck frequency: Not Checking Taking Insulin ?: no Blood Pressure Monitoring: not checking Retinal Examination: Up to Date Foot Exam: Up to Date Diabetic Education: Completed Pneumovax: Up to Date Influenza: Up to Date Aspirin : yes  HYPERTENSION / HYPERLIPIDEMIA Satisfied with current treatment? yes Duration of hypertension: chronic BP monitoring frequency: not checking BP medication side effects: no Past BP meds: lasix , metoprolol , benazepril  Duration of hyperlipidemia: chronic Cholesterol medication side effects: no Cholesterol supplements: none Past cholesterol medications: atorvastatin  Medication compliance: excellent compliance Aspirin : yes Recent stressors: no Recurrent headaches: no Visual changes: no Palpitations: no Dyspnea: no Chest pain: no Lower extremity edema: no Dizzy/lightheaded: no   Relevant past medical, surgical, family and social history reviewed and updated as indicated. Interim medical history since our last visit reviewed. Allergies and medications reviewed and updated.  Review of Systems  Constitutional: Negative.   Respiratory: Negative.    Cardiovascular: Negative.   Musculoskeletal: Negative.   Neurological: Negative.   Psychiatric/Behavioral: Negative.      Per HPI unless specifically indicated above     Objective:    BP 118/82   Pulse 90   Temp 98 F (36.7 C) (Oral)   Ht 5' 11 (1.803 m)   Wt 259 lb 6.4 oz (117.7 kg)   SpO2 91%   BMI  36.18 kg/m   Wt Readings from Last 3 Encounters:  10/10/24 259 lb 6.4 oz (117.7 kg)  08/10/24 272 lb 12.8 oz (123.7 kg)  08/01/24 274 lb 3.2 oz (124.4 kg)    Physical Exam Vitals and nursing note reviewed.  Constitutional:      General: He is not in acute distress.    Appearance: Normal appearance. He is not ill-appearing, toxic-appearing or diaphoretic.  HENT:     Head: Normocephalic and atraumatic.     Right Ear: External ear normal.     Left Ear: External ear normal.     Nose: Nose normal.     Mouth/Throat:     Mouth: Mucous membranes are moist.     Pharynx: Oropharynx is clear.  Eyes:     General: No scleral icterus.       Right eye: No discharge.        Left eye: No discharge.     Extraocular Movements: Extraocular movements intact.     Conjunctiva/sclera: Conjunctivae normal.     Pupils: Pupils are equal, round, and reactive to light.  Cardiovascular:     Rate and Rhythm: Normal rate and regular rhythm.     Pulses: Normal pulses.     Heart sounds: Normal heart sounds. No murmur heard.    No friction rub. No gallop.  Pulmonary:     Effort: Pulmonary effort is normal. No respiratory distress.     Breath sounds: Normal breath sounds. No stridor. No wheezing, rhonchi or rales.  Chest:     Chest wall: No tenderness.  Musculoskeletal:        General: Normal range of motion.     Cervical back: Normal range of motion and neck supple.  Skin:    General: Skin is warm and dry.     Capillary Refill: Capillary refill takes less than 2 seconds.     Coloration: Skin is not jaundiced or pale.     Findings: No bruising, erythema, lesion or rash.  Neurological:     General: No focal deficit present.     Mental Status: He is alert and oriented to person, place, and time. Mental status is at baseline.  Psychiatric:        Mood and Affect: Mood normal.        Behavior: Behavior normal.        Thought Content: Thought content normal.        Judgment: Judgment normal.      Results for orders placed or performed in visit on 09/17/24  OPHTHALMOLOGY REPORT-SCANNED   Collection Time: 08/02/24  9:12 AM  Result Value Ref Range   HM Diabetic Eye Exam Retinopathy (A) No Retinopathy   A Comment        Assessment & Plan:   Problem List Items Addressed This Visit       Cardiovascular and Mediastinum   Atrial fibrillation, chronic (HCC)   Under good control on current regimen. Continue current regimen. Continue to monitor. Call with any concerns. Refills given. Labs drawn today.        Relevant Medications   atorvastatin  (LIPITOR) 20 MG tablet   benazepril  (LOTENSIN ) 20 MG tablet   furosemide  (LASIX ) 20 MG tablet   Metoprolol  Tartrate 75 MG TABS   rivaroxaban  (XARELTO ) 20 MG TABS tablet   Congestive heart failure (HCC)   Euvolemic today. Continue current regimen. Continue to follow with cardiology. Call with any cocnerns.       Relevant Medications   atorvastatin  (LIPITOR) 20 MG tablet   benazepril  (LOTENSIN ) 20 MG tablet   furosemide  (LASIX ) 20 MG tablet   Metoprolol  Tartrate 75 MG TABS   rivaroxaban  (XARELTO ) 20 MG TABS tablet     Endocrine   Type 2 diabetes mellitus with diabetic neuropathy, unspecified (HCC) - Primary   Doing GREAT with A1c down to 7.4 from 10.9! Continue titration of ozempic . Continue to monitor. Call with any concerns.        Relevant Medications   atorvastatin  (LIPITOR) 20 MG tablet   benazepril  (LOTENSIN ) 20 MG tablet   metFORMIN  (GLUCOPHAGE -XR) 500 MG 24 hr tablet   Semaglutide , 2 MG/DOSE, 8 MG/3ML SOPN   Other Relevant Orders   Bayer DCA Hb A1c Waived   Lipid Panel w/o Chol/HDL Ratio   CBC with Differential/Platelet   Comprehensive metabolic panel with GFR     Genitourinary   Stage 3a chronic kidney disease (HCC)   Rechecking labs today. Await results. Treat as needed.       Clear cell renal cell carcinoma, right (HCC)   Continue to follow with oncology. Call with any concerns. Continue to monitor.          Other   Morbid obesity (HCC)   Due to diabetes, HTN. Continue diet and exercise with goal of losing 1-2lbs per week. Call with any concerns.       Relevant Medications   metFORMIN  (GLUCOPHAGE -XR) 500 MG 24 hr tablet   Semaglutide , 2 MG/DOSE, 8 MG/3ML SOPN     Follow up plan: Return in about 3 months (around 01/08/2025) for physical.

## 2024-10-10 NOTE — Assessment & Plan Note (Signed)
 Doing GREAT with A1c down to 7.4 from 10.9! Continue titration of ozempic . Continue to monitor. Call with any concerns.

## 2024-10-10 NOTE — Assessment & Plan Note (Signed)
 Due to diabetes, HTN. Continue diet and exercise with goal of losing 1-2lbs per week. Call with any concerns.

## 2024-10-10 NOTE — Assessment & Plan Note (Signed)
 Continue to follow with oncology. Call with any concerns. Continue to monitor.

## 2024-10-10 NOTE — Assessment & Plan Note (Signed)
 Under good control on current regimen. Continue current regimen. Continue to monitor. Call with any concerns. Refills given. Labs drawn today.

## 2024-10-10 NOTE — Assessment & Plan Note (Signed)
 Euvolemic today. Continue current regimen. Continue to follow with cardiology. Call with any cocnerns.

## 2024-10-10 NOTE — Assessment & Plan Note (Signed)
 Rechecking labs today. Await results. Treat as needed.

## 2024-10-11 ENCOUNTER — Ambulatory Visit: Payer: Self-pay | Admitting: Family Medicine

## 2024-10-11 LAB — COMPREHENSIVE METABOLIC PANEL WITH GFR
ALT: 17 IU/L (ref 0–44)
AST: 19 IU/L (ref 0–40)
Albumin: 4.3 g/dL (ref 3.8–4.8)
Alkaline Phosphatase: 64 IU/L (ref 47–123)
BUN/Creatinine Ratio: 12 (ref 10–24)
BUN: 17 mg/dL (ref 8–27)
Bilirubin Total: 0.8 mg/dL (ref 0.0–1.2)
CO2: 23 mmol/L (ref 20–29)
Calcium: 9.3 mg/dL (ref 8.6–10.2)
Chloride: 97 mmol/L (ref 96–106)
Creatinine, Ser: 1.47 mg/dL — ABNORMAL HIGH (ref 0.76–1.27)
Globulin, Total: 2.4 g/dL (ref 1.5–4.5)
Glucose: 183 mg/dL — ABNORMAL HIGH (ref 70–99)
Potassium: 5.7 mmol/L — ABNORMAL HIGH (ref 3.5–5.2)
Sodium: 137 mmol/L (ref 134–144)
Total Protein: 6.7 g/dL (ref 6.0–8.5)
eGFR: 50 mL/min/1.73 — ABNORMAL LOW (ref >59)

## 2024-10-11 LAB — CBC WITH DIFFERENTIAL/PLATELET
Basophils Absolute: 0.1 x10E3/uL (ref 0.0–0.2)
Basos: 1 %
EOS (ABSOLUTE): 0.1 x10E3/uL (ref 0.0–0.4)
Eos: 1 %
Hematocrit: 51.7 % — ABNORMAL HIGH (ref 37.5–51.0)
Hemoglobin: 16.7 g/dL (ref 13.0–17.7)
Immature Grans (Abs): 0.1 x10E3/uL (ref 0.0–0.1)
Immature Granulocytes: 1 %
Lymphocytes Absolute: 0.8 x10E3/uL (ref 0.7–3.1)
Lymphs: 9 %
MCH: 29.9 pg (ref 26.6–33.0)
MCHC: 32.3 g/dL (ref 31.5–35.7)
MCV: 93 fL (ref 79–97)
Monocytes Absolute: 0.7 x10E3/uL (ref 0.1–0.9)
Monocytes: 7 %
Neutrophils Absolute: 7.4 x10E3/uL — ABNORMAL HIGH (ref 1.4–7.0)
Neutrophils: 81 %
Platelets: 217 x10E3/uL (ref 150–450)
RBC: 5.59 x10E6/uL (ref 4.14–5.80)
RDW: 13 % (ref 11.6–15.4)
WBC: 9.1 x10E3/uL (ref 3.4–10.8)

## 2024-10-11 LAB — LIPID PANEL W/O CHOL/HDL RATIO
Cholesterol, Total: 140 mg/dL (ref 100–199)
HDL: 32 mg/dL — ABNORMAL LOW (ref >39)
LDL Chol Calc (NIH): 86 mg/dL (ref 0–99)
Triglycerides: 122 mg/dL (ref 0–149)
VLDL Cholesterol Cal: 22 mg/dL (ref 5–40)

## 2024-11-14 ENCOUNTER — Telehealth: Payer: Self-pay

## 2024-11-14 ENCOUNTER — Ambulatory Visit: Payer: Self-pay

## 2024-11-14 NOTE — Telephone Encounter (Signed)
 Copied from CRM [id number]. Topic: Clinical - Medication Question >> Nov 14, 2024  3:00 PM Jeff [name] wrote: Reason for CRM: Pt called and stated that he needs a refill on his cough medicine. It does have codeine in it and it is prn. He doesn't know the name of it but Megan prescribed it for him and he stated that she would know what it is. Please refill or give the pt a call back.   ----------------------------------------------------------------------- From previous Reason for Contact - Medication Refill: Medication:   Has the patient contacted their pharmacy?   (Agent: If no, request that the patient contact the pharmacy for the refill. If patient does not wish to contact the pharmacy document the reason why and proceed with request.) (Agent: If yes, when and what did the pharmacy advise?)  This is the patient's preferred pharmacy:  [id number] - [place], [place] - [address] [place] [address] Phone: [phone] Fax: [phone]  [id number] [place], [place] - [address] [place] [address] Phone: [phone] Fax: [phone]  [facility] [address], [place] - [address] [place] [address] Phone: [phone] Fax: [phone]  Is this the correct pharmacy for this prescription?   If no, delete pharmacy and type the correct one.   Has the prescription been filled recently?    Is the patient out of the medication?    Has the patient been seen for an appointment in the last year OR does the patient have an upcoming appointment?    Can we respond through MyChart?    Agent: Please be advised that Rx refills may take up to 3 business days. We ask that you follow-up with your pharmacy.

## 2024-11-14 NOTE — Telephone Encounter (Signed)
 Routing to provider to advise. Medication is not on current med list but patient states he takes this PRN. Can this me sent in for him or does he need an appointment?

## 2024-11-14 NOTE — Telephone Encounter (Signed)
 FYI Only or Action Required?: Action required by provider: request for appointment.  Patient was last seen in primary care on 10/10/2024 by Vicci Duwaine SQUIBB, DO.  Called Nurse Triage reporting Cough and Sinusitis.  Symptoms began Sunday .  Interventions attempted: OTC medications: mucinex  and nyquil.  Symptoms are: stable.  Triage Disposition: See HCP Within 4 Hours (Or PCP Triage)  Patient/caregiver understands and will follow disposition?: No, wishes to speak with PCP           Pt requesting an appt for evaluation. Reason for Disposition  [1] Fever > 101 F (38.3 C) AND [2] age > 60 years  Answer Assessment - Initial Assessment Questions Caller ended call before warm transfer complete will try and call patient back now. Called patient mobile number. Reached patient   Patient reports symptoms  started Sunday congestion cough fevers pitting up phlegm yellow. Took flu shot.  Sinus pain. Fever checked 102 highest and goes 96. Today hasn't checked tempature.  Mucinex  and nyquil. Requesting appointment in the office. Not going to to UC. Wants to only see PCP this RN not able to find any appointments this week or next. Patient requesting a call back to be scheduled with  PCP   1. LOCATION: Where does it hurt?      Sinuses forehead cheeks and around eyes  2. ONSET: When did the sinus pain start?  (e.g., hours, days)      Sunday  3. SEVERITY: How bad is the pain?   (Scale 0-10; or none, mild, moderate or severe)     6/10 sinus pain 4. RECURRENT SYMPTOM: Have you ever had sinus problems before? If Yes, ask: When was the last time? and What happened that time?      Consistent sinus issues , chronic sinuitis per chart  5. NASAL CONGESTION: Is the nose blocked? If Yes, ask: Can you open it or must you breathe through your mouth?     Yes  6. NASAL DISCHARGE: Do you have discharge from your nose? If so ask, What color?     Yes , color is yellow looking  7. FEVER:  Do you have a fever? If Yes, ask: What is it, how was it measured, and when did it start?      Yes fever highest was 102  8. OTHER SYMPTOMS: Do you have any other symptoms? (e.g., sore throat, cough, earache, difficulty breathing)     Right ear pain, fevers, constant coughing, fatigue  Patient denies difficulty breathing, chest pain,wheezing . Syncope, dizizness  Protocols used: Sinus Pain or Congestion-A-AH  Copied from CRM #[id number]. Topic: Clinical - Red Word Triage >> Nov 14, 2024  3:39 PM Fonda T wrote: Red Word that prompted transfer to Nurse Triage: Pt states he thinks he is coming down with the flu, with fever, congestion, productive cough with yellow colored mucous, no appetite, overall doe not feel well.

## 2024-11-15 NOTE — Telephone Encounter (Signed)
 I cannot see him today. I should be able to double book him tomorrow.

## 2024-11-15 NOTE — Telephone Encounter (Signed)
 Dr. Vicci, would you like to work this patient in today or tomorrow for an appointment?

## 2024-11-15 NOTE — Telephone Encounter (Signed)
 Copied from CRM [id number]. Topic: Clinical - Red Word Triage >> Nov 14, 2024  3:39 PM Fonda T wrote: Red Word that prompted transfer to Nurse Triage: Pt states he thinks he is coming down with the flu, with fever, congestion, productive cough with yellow colored mucous, no appetite, overall doe not feel well.   Pt requesting an appt for evaluation. >> Nov 15, 2024  8:20 AM Thersia BROCKS wrote: Patient called in regarding being able to see Dr.Johnson, would like a callback

## 2024-11-16 ENCOUNTER — Ambulatory Visit: Admitting: Family Medicine

## 2024-11-16 ENCOUNTER — Encounter: Payer: Self-pay | Admitting: Family Medicine

## 2024-11-16 VITALS — BP 117/78 | HR 110 | Temp 98.2°F | Resp 17 | Ht 70.98 in | Wt 257.0 lb

## 2024-11-16 DIAGNOSIS — J1000 Influenza due to other identified influenza virus with unspecified type of pneumonia: Secondary | ICD-10-CM

## 2024-11-16 MED ORDER — DOXYCYCLINE HYCLATE 100 MG PO TABS: 100.0000 mg | ORAL_TABLET | Freq: Two times a day (BID) | ORAL | 0 refills | Status: DC

## 2024-11-16 MED ORDER — HYDROCOD POLI-CHLORPHE POLI ER 10-8 MG/5ML PO SUER: 5.0000 mL | Freq: Two times a day (BID) | ORAL | 0 refills | Status: DC | PRN

## 2024-11-16 MED ORDER — ALBUTEROL SULFATE (2.5 MG/3ML) 0.083% IN NEBU: 2.5000 mg | INHALATION_SOLUTION | Freq: Once | RESPIRATORY_TRACT | Status: AC

## 2024-11-16 MED ORDER — METHYLPREDNISOLONE SODIUM SUCC 40 MG IJ SOLR
40.0000 mg | Freq: Once | INTRAMUSCULAR | Status: AC
  Administered 2024-11-16: 40 mg via INTRAMUSCULAR

## 2024-11-16 MED ORDER — PREDNISONE 10 MG PO TABS: ORAL_TABLET | ORAL | 0 refills | Status: DC

## 2024-11-16 NOTE — Progress Notes (Signed)
 "  BP 117/78 (BP Location: Left Arm, Patient Position: Sitting, Cuff Size: Large)   Pulse (!) 110   Temp 98.2 F (36.8 C) (Oral)   Resp 17   Ht 5' 10.98 (1.803 m)   Wt 257 lb (116.6 kg)   SpO2 98%   BMI 35.86 kg/m    Subjective:    Patient ID: Jeff Dasie Royetta Mickey., male    DOB: Mar 16, 1951, 74 y.o.   MRN: [id number]  HPI: Jeff Blank. is a 74 y.o. male  Chief Complaint  Patient presents with   Influenza    Has been taking Mucinex , cough drops, ES pain relievers and has not been able to clear it up himself. Ear clogged, headaches, fevers up to 102, body sweats, coughing with yellow pheglm, chest pain/pressure and congestion, not sleeping with the coughing, wheezing at times. Started in [place].   UPPER RESPIRATORY TRACT INFECTION Duration: 10 days Worst symptom: cough, congestion Fever: yes Cough: yes Shortness of breath: no Wheezing: yes Chest pain: yes Chest tightness: no Chest congestion: yes Nasal congestion: yes Runny nose: yes Post nasal drip: yes Sneezing: no Sore throat: no Swollen glands: no Sinus pressure: no Headache: no Face pain: no Toothache: no Ear pain: yes right Ear pressure: yes right Eyes red/itching:no Eye drainage/crusting: no  Vomiting: no Rash: no Fatigue: yes Sick contacts: yes Strep contacts: no  Context: worse Recurrent sinusitis: no Relief with OTC cold/cough medications: no  Treatments attempted: mucinex , nyquil   Relevant past medical, surgical, family and social history reviewed and updated as indicated. Interim medical history since our last visit reviewed. Allergies and medications reviewed and updated.  Review of Systems  Constitutional:  Positive for diaphoresis, fatigue and fever. Negative for activity change, appetite change, chills and unexpected weight change.  HENT:  Positive for congestion and ear pain. Negative for dental problem, drooling, ear discharge, facial swelling, hearing loss, mouth sores,  nosebleeds, postnasal drip, rhinorrhea, sinus pressure, sinus pain, sneezing, sore throat, tinnitus, trouble swallowing and voice change.   Eyes: Negative.   Respiratory:  Positive for cough, chest tightness, shortness of breath and wheezing. Negative for apnea, choking and stridor.   Cardiovascular: Negative.   Psychiatric/Behavioral: Negative.      Per HPI unless specifically indicated above     Objective:    BP 117/78 (BP Location: Left Arm, Patient Position: Sitting, Cuff Size: Large)   Pulse (!) 110   Temp 98.2 F (36.8 C) (Oral)   Resp 17   Ht 5' 10.98 (1.803 m)   Wt 257 lb (116.6 kg)   SpO2 98%   BMI 35.86 kg/m   Wt Readings from Last 3 Encounters:  11/16/24 257 lb (116.6 kg)  10/10/24 259 lb 6.4 oz (117.7 kg)  08/10/24 272 lb 12.8 oz (123.7 kg)    Physical Exam Vitals and nursing note reviewed.  Constitutional:      General: He is not in acute distress.    Appearance: Normal appearance. He is not ill-appearing, toxic-appearing or diaphoretic.  HENT:     Head: Normocephalic and atraumatic.     Right Ear: External ear normal. There is no impacted cerumen.     Left Ear: External ear normal.     Nose: Rhinorrhea present. No congestion.     Mouth/Throat:     Mouth: Mucous membranes are moist.     Pharynx: Oropharynx is clear. No oropharyngeal exudate or posterior oropharyngeal erythema.  Eyes:     General: No scleral icterus.  Right eye: No discharge.        Left eye: No discharge.     Extraocular Movements: Extraocular movements intact.     Conjunctiva/sclera: Conjunctivae normal.     Pupils: Pupils are equal, round, and reactive to light.  Cardiovascular:     Rate and Rhythm: Normal rate and regular rhythm.     Pulses: Normal pulses.     Heart sounds: Normal heart sounds. No murmur heard.    No friction rub. No gallop.  Pulmonary:     Effort: Pulmonary effort is normal. No respiratory distress.     Breath sounds: No stridor. Wheezing and rhonchi  present. No rales.     Comments: Wheezes and rhonchi throughout prior to neb- rhonchi in LUL following neb Chest:     Chest wall: No tenderness.  Musculoskeletal:        General: Normal range of motion.     Cervical back: Normal range of motion and neck supple.  Skin:    General: Skin is warm and dry.     Capillary Refill: Capillary refill takes less than 2 seconds.     Coloration: Skin is not jaundiced or pale.     Findings: No bruising, erythema, lesion or rash.  Neurological:     General: No focal deficit present.     Mental Status: He is alert and oriented to person, place, and time. Mental status is at baseline.  Psychiatric:        Mood and Affect: Mood normal.        Behavior: Behavior normal.        Thought Content: Thought content normal.        Judgment: Judgment normal.     Results for orders placed or performed in visit on 10/10/24  Bayer DCA Hb A1c Waived   Collection Time: 10/10/24 10:35 AM  Result Value Ref Range   HB A1C (BAYER DCA - WAIVED) 7.4 (H) 4.8 - 5.6 %  Lipid Panel w/o Chol/HDL Ratio   Collection Time: 10/10/24 11:05 AM  Result Value Ref Range   Cholesterol, Total 140 100 - 199 mg/dL   Triglycerides 877 0 - 149 mg/dL   HDL 32 (L) >60 mg/dL   VLDL Cholesterol Cal 22 5 - 40 mg/dL   LDL Chol Calc (NIH) 86 0 - 99 mg/dL  CBC with Differential/Platelet   Collection Time: 10/10/24 11:05 AM  Result Value Ref Range   WBC 9.1 3.4 - 10.8 x10E3/uL   RBC 5.59 4.14 - 5.80 x10E6/uL   Hemoglobin 16.7 13.0 - 17.7 g/dL   Hematocrit 48.2 (H) 62.4 - 51.0 %   MCV 93 79 - 97 fL   MCH 29.9 26.6 - 33.0 pg   MCHC 32.3 31.5 - 35.7 g/dL   RDW 86.9 88.3 - 84.5 %   Platelets 217 150 - 450 x10E3/uL   Neutrophils 81 Not Estab. %   Lymphs 9 Not Estab. %   Monocytes 7 Not Estab. %   Eos 1 Not Estab. %   Basos 1 Not Estab. %   Neutrophils Absolute 7.4 (H) 1.4 - 7.0 x10E3/uL   Lymphocytes Absolute 0.8 0.7 - 3.1 x10E3/uL   Monocytes Absolute 0.7 0.1 - 0.9 x10E3/uL   EOS  (ABSOLUTE) 0.1 0.0 - 0.4 x10E3/uL   Basophils Absolute 0.1 0.0 - 0.2 x10E3/uL   Immature Granulocytes 1 Not Estab. %   Immature Grans (Abs) 0.1 0.0 - 0.1 x10E3/uL  Comprehensive metabolic panel with GFR   Collection Time: 10/10/24  11:05 AM  Result Value Ref Range   Glucose 183 (H) 70 - 99 mg/dL   BUN 17 8 - 27 mg/dL   Creatinine, Ser 8.52 (H) 0.76 - 1.27 mg/dL   eGFR 50 (L) >40 fO/fpw/8.26   BUN/Creatinine Ratio 12 10 - 24   Sodium 137 134 - 144 mmol/L   Potassium 5.7 (H) 3.5 - 5.2 mmol/L   Chloride 97 96 - 106 mmol/L   CO2 23 20 - 29 mmol/L   Calcium  9.3 8.6 - 10.2 mg/dL   Total Protein 6.7 6.0 - 8.5 g/dL   Albumin 4.3 3.8 - 4.8 g/dL   Globulin, Total 2.4 1.5 - 4.5 g/dL   Bilirubin Total 0.8 0.0 - 1.2 mg/dL   Alkaline Phosphatase 64 47 - 123 IU/L   AST 19 0 - 40 IU/L   ALT 17 0 - 44 IU/L      Assessment & Plan:   Problem List Items Addressed This Visit   None Visit Diagnoses       Pneumonia due to influenza A virus    -  Primary   Will treat with doxycycline  and steroids. Recheck 2 weeks. Call with any concerns or if not getting better.   Relevant Medications   albuterol  (PROVENTIL ) (2.5 MG/3ML) 0.083% nebulizer solution 2.5 mg   methylPREDNISolone  sodium succinate (SOLU-MEDROL ) 40 mg/mL injection 40 mg   doxycycline  (VIBRA -TABS) 100 MG tablet   chlorpheniramine-HYDROcodone  (TUSSIONEX) 10-8 MG/5ML        Follow up plan: Return in about 2 weeks (around 11/30/2024) for lung recheck- ok to double book.      "

## 2024-11-16 NOTE — Telephone Encounter (Signed)
 Patient seen today for office visit.

## 2024-11-30 ENCOUNTER — Encounter: Payer: Self-pay | Admitting: Family Medicine

## 2024-11-30 ENCOUNTER — Ambulatory Visit: Admitting: Family Medicine

## 2024-11-30 VITALS — BP 122/72 | HR 80 | Temp 97.7°F | Resp 18 | Ht 70.98 in | Wt 253.8 lb

## 2024-11-30 DIAGNOSIS — H6991 Unspecified Eustachian tube disorder, right ear: Secondary | ICD-10-CM

## 2024-11-30 DIAGNOSIS — J1000 Influenza due to other identified influenza virus with unspecified type of pneumonia: Secondary | ICD-10-CM | POA: Diagnosis not present

## 2024-11-30 MED ORDER — PREDNISONE 50 MG PO TABS: 50.0000 mg | ORAL_TABLET | Freq: Every day | ORAL | 0 refills | Status: DC

## 2024-11-30 NOTE — Progress Notes (Signed)
 "  BP 122/72   Pulse 80   Temp 97.7 F (36.5 C) (Oral)   Resp 18   Ht 5' 10.98 (1.803 m)   Wt 253 lb 12.8 oz (115.1 kg)   SpO2 98%   BMI 35.41 kg/m    Subjective:    Patient ID: Jeff [name]., male    DOB: 06-06-1951, 74 y.o.   MRN: [id number]  HPI: Chandon Lazcano. is a 74 y.o. male  Chief Complaint  Patient presents with   Ear Fullness    Right ear is still closed   Follow-up    Here for a follow up, here to get checked for pneumonia that he had   Feeling much better. No cough. No fevers. No SOB or chest pain.  EAG CLOGGED Duration: about 2 weeks Involved ear(s):  right Sensation of feeling clogged/plugged: yes Decreased/muffled hearing:yes Ear pain: no Fever: no Otorrhea: no Hearing loss: yes Upper respiratory infection symptoms: no Using Q-Tips: no Status: stable History of cerumenosis: no Treatments attempted: none  Relevant past medical, surgical, family and social history reviewed and updated as indicated. Interim medical history since our last visit reviewed. Allergies and medications reviewed and updated.  Review of Systems  Constitutional: Negative.   HENT:  Positive for hearing loss. Negative for congestion, dental problem, drooling, ear discharge, ear pain, facial swelling, mouth sores, nosebleeds, postnasal drip, rhinorrhea, sinus pressure, sinus pain, sneezing, sore throat, tinnitus, trouble swallowing and voice change.   Respiratory: Negative.    Cardiovascular: Negative.   Psychiatric/Behavioral: Negative.      Per HPI unless specifically indicated above     Objective:    BP 122/72   Pulse 80   Temp 97.7 F (36.5 C) (Oral)   Resp 18   Ht 5' 10.98 (1.803 m)   Wt 253 lb 12.8 oz (115.1 kg)   SpO2 98%   BMI 35.41 kg/m   Wt Readings from Last 3 Encounters:  11/30/24 253 lb 12.8 oz (115.1 kg)  11/16/24 257 lb (116.6 kg)  10/10/24 259 lb 6.4 oz (117.7 kg)    Physical Exam Vitals and nursing note reviewed.  Constitutional:       General: He is not in acute distress.    Appearance: Normal appearance. He is not ill-appearing, toxic-appearing or diaphoretic.  HENT:     Head: Normocephalic and atraumatic.     Right Ear: Tympanic membrane, ear canal and external ear normal.     Left Ear: Tympanic membrane, ear canal and external ear normal.     Nose: Nose normal.     Mouth/Throat:     Mouth: Mucous membranes are moist.     Pharynx: Oropharynx is clear.  Eyes:     General: No scleral icterus.       Right eye: No discharge.        Left eye: No discharge.     Extraocular Movements: Extraocular movements intact.     Conjunctiva/sclera: Conjunctivae normal.     Pupils: Pupils are equal, round, and reactive to light.  Cardiovascular:     Rate and Rhythm: Normal rate and regular rhythm.     Pulses: Normal pulses.     Heart sounds: Normal heart sounds. No murmur heard.    No friction rub. No gallop.  Pulmonary:     Effort: Pulmonary effort is normal. No respiratory distress.     Breath sounds: Normal breath sounds. No stridor. No wheezing, rhonchi or rales.  Chest:  Chest wall: No tenderness.  Musculoskeletal:        General: Normal range of motion.     Cervical back: Normal range of motion and neck supple.  Skin:    General: Skin is warm and dry.     Capillary Refill: Capillary refill takes less than 2 seconds.     Coloration: Skin is not jaundiced or pale.     Findings: No bruising, erythema, lesion or rash.  Neurological:     General: No focal deficit present.     Mental Status: He is alert and oriented to person, place, and time. Mental status is at baseline.  Psychiatric:        Mood and Affect: Mood normal.        Behavior: Behavior normal.        Thought Content: Thought content normal.        Judgment: Judgment normal.     Results for orders placed or performed in visit on 10/10/24  Bayer DCA Hb A1c Waived   Collection Time: 10/10/24 10:35 AM  Result Value Ref Range   HB A1C (BAYER DCA -  WAIVED) 7.4 (H) 4.8 - 5.6 %  Lipid Panel w/o Chol/HDL Ratio   Collection Time: 10/10/24 11:05 AM  Result Value Ref Range   Cholesterol, Total 140 100 - 199 mg/dL   Triglycerides 877 0 - 149 mg/dL   HDL 32 (L) >60 mg/dL   VLDL Cholesterol Cal 22 5 - 40 mg/dL   LDL Chol Calc (NIH) 86 0 - 99 mg/dL  CBC with Differential/Platelet   Collection Time: 10/10/24 11:05 AM  Result Value Ref Range   WBC 9.1 3.4 - 10.8 x10E3/uL   RBC 5.59 4.14 - 5.80 x10E6/uL   Hemoglobin 16.7 13.0 - 17.7 g/dL   Hematocrit 48.2 (H) 62.4 - 51.0 %   MCV 93 79 - 97 fL   MCH 29.9 26.6 - 33.0 pg   MCHC 32.3 31.5 - 35.7 g/dL   RDW 86.9 88.3 - 84.5 %   Platelets 217 150 - 450 x10E3/uL   Neutrophils 81 Not Estab. %   Lymphs 9 Not Estab. %   Monocytes 7 Not Estab. %   Eos 1 Not Estab. %   Basos 1 Not Estab. %   Neutrophils Absolute 7.4 (H) 1.4 - 7.0 x10E3/uL   Lymphocytes Absolute 0.8 0.7 - 3.1 x10E3/uL   Monocytes Absolute 0.7 0.1 - 0.9 x10E3/uL   EOS (ABSOLUTE) 0.1 0.0 - 0.4 x10E3/uL   Basophils Absolute 0.1 0.0 - 0.2 x10E3/uL   Immature Granulocytes 1 Not Estab. %   Immature Grans (Abs) 0.1 0.0 - 0.1 x10E3/uL  Comprehensive metabolic panel with GFR   Collection Time: 10/10/24 11:05 AM  Result Value Ref Range   Glucose 183 (H) 70 - 99 mg/dL   BUN 17 8 - 27 mg/dL   Creatinine, Ser 8.52 (H) 0.76 - 1.27 mg/dL   eGFR 50 (L) >40 fO/fpw/8.26   BUN/Creatinine Ratio 12 10 - 24   Sodium 137 134 - 144 mmol/L   Potassium 5.7 (H) 3.5 - 5.2 mmol/L   Chloride 97 96 - 106 mmol/L   CO2 23 20 - 29 mmol/L   Calcium  9.3 8.6 - 10.2 mg/dL   Total Protein 6.7 6.0 - 8.5 g/dL   Albumin 4.3 3.8 - 4.8 g/dL   Globulin, Total 2.4 1.5 - 4.5 g/dL   Bilirubin Total 0.8 0.0 - 1.2 mg/dL   Alkaline Phosphatase 64 47 - 123 IU/L  AST 19 0 - 40 IU/L   ALT 17 0 - 44 IU/L      Assessment & Plan:   Problem List Items Addressed This Visit   None Visit Diagnoses       ETD (Eustachian tube dysfunction), right    -  Primary   Will  treat with prednisone  burst. Call with any concerns or if not getting better.     Pneumonia due to influenza A virus       Resolved. Lungs clear today.        Follow up plan: Return if symptoms worsen or fail to improve.      "

## 2024-12-10 ENCOUNTER — Other Ambulatory Visit: Payer: Self-pay

## 2024-12-10 DIAGNOSIS — E1140 Type 2 diabetes mellitus with diabetic neuropathy, unspecified: Secondary | ICD-10-CM

## 2024-12-10 NOTE — Progress Notes (Signed)
" ° °  12/10/24  Patient ID: Patsy Dasie Royetta Mickey., male   DOB: 1951-03-26, 74 y.o.   MRN: [id number]  Subjective/Objective Telephone visit to follow-up on management of diabetes   Diabetes Management -Current  medications:  Ozempic  2mg  weekly, metformin  XR 1000mg  BID -Patient has increased Ozempic  to 2mg  dosing since we last spoke, and he endorses tolerating medication well with no GI upset (or any other ADE's) -Was receiving Ozempic   through [facility] PAP and is down to 3 doses of medication on hand -Patient does monitor home BG regularly and states FBG averaging 130 at this time -Does not endorse any s/sx of hypoglycemia -Statin for ASCVD risk reduction:  atorvastatin  20mg  daily -ACEi/ARB for cardiorenal protection:  benazepril  20mg  -UACR 30-300 on 10/3  Lab Results  Component Value Date   HGBA1C 7.4 (H) 10/10/2024   HGBA1C 10.9 (H) 08/10/2024   HGBA1C 9.9 (H) 03/30/2024      Component Value Date/Time   NA 137 10/10/2024 1105   NA 140 04/06/2013 1129   K 5.7 (H) 10/10/2024 1105   K 4.0 04/06/2013 1129   CL 97 10/10/2024 1105   CL 105 04/06/2013 1129   CO2 23 10/10/2024 1105   CO2 23 04/06/2013 1129   GLUCOSE 183 (H) 10/10/2024 1105   GLUCOSE 284 (H) 11/07/2023 0500   GLUCOSE 132 (H) 04/06/2013 1129   BUN 17 10/10/2024 1105   BUN 12 04/06/2013 1129   CREATININE 1.47 (H) 10/10/2024 1105   CREATININE 0.83 04/06/2013 1129   CALCIUM  9.3 10/10/2024 1105   CALCIUM  9.0 04/06/2013 1129   PROT 6.7 10/10/2024 1105   ALBUMIN 4.3 10/10/2024 1105   AST 19 10/10/2024 1105   AST 48 (H) 09/07/2018 0822   ALT 17 10/10/2024 1105   ALT 50 (H) 09/07/2018 0822   ALKPHOS 64 10/10/2024 1105   BILITOT 0.8 10/10/2024 1105   EGFR 50 (L) 10/10/2024 1105   GFRNONAA 47 (L) 11/08/2023 1259   GFRNONAA >60 04/06/2013 1129      Component Value Date/Time   CHOL 140 10/10/2024 1105   CHOL 130 09/07/2018 0822   TRIG 122 10/10/2024 1105   TRIG 95 09/07/2018 0822   TRIG 111 02/17/2010 0000   HDL 32 (L)  10/10/2024 1105   CHOLHDL 4.0 10/06/2019 1253   VLDL 18 10/06/2019 1253   VLDL 19 09/07/2018 0822   LDLCALC 86 10/10/2024 1105   LABVLDL 22 10/10/2024 1105     Assessment/Plan   Diabetes Management -A1c not at goal of <7%, but has improved quite a bit from 10.9% in October 2025 -UACR not at goal -BP at goal of <130/80 -LDL not at goal of <70 -I recommend patient continue current regimen at this time- will coordinate with PA team to run test claim for Ozempic  2mg  to see if this will be affordable on insurance.  If not, consider changing to Trulicity and applying for [employer] PAP; but concern that patient will not see as much weight loss benefit as he is seeing with Ozempic  (down 25lbs since starting). -Now that A1c <9%, could also consider addition of SGLT2 based on diabetes and CHF (would likely qualify for [employer] to cover copay for either Farxiga  or Jardiance )- make sure no PMH of frequent/recurrent genitourinary infections  Follow-up:  Will follow-up with patient once copay for Ozempic  is known   Channing DELENA Mealing, PharmD, DPLA  "

## 2025-01-07 ENCOUNTER — Ambulatory Visit: Admitting: Cardiovascular Disease

## 2025-01-09 ENCOUNTER — Encounter: Admitting: Family Medicine

## 2025-04-09 ENCOUNTER — Ambulatory Visit
